# Patient Record
Sex: Female | Born: 1940
Health system: Southern US, Community
[De-identification: ages and names within clinical notes are randomized; demographics above are authoritative.]

## PROBLEM LIST (undated history)

## (undated) DIAGNOSIS — I219 Acute myocardial infarction, unspecified: Secondary | ICD-10-CM

## (undated) DIAGNOSIS — M1990 Unspecified osteoarthritis, unspecified site: Secondary | ICD-10-CM

## (undated) DIAGNOSIS — K219 Gastro-esophageal reflux disease without esophagitis: Secondary | ICD-10-CM

## (undated) DIAGNOSIS — I10 Essential (primary) hypertension: Secondary | ICD-10-CM

## (undated) DIAGNOSIS — I509 Heart failure, unspecified: Secondary | ICD-10-CM

## (undated) DIAGNOSIS — T4145XA Adverse effect of unspecified anesthetic, initial encounter: Secondary | ICD-10-CM

## (undated) DIAGNOSIS — I6529 Occlusion and stenosis of unspecified carotid artery: Secondary | ICD-10-CM

## (undated) DIAGNOSIS — R112 Nausea with vomiting, unspecified: Secondary | ICD-10-CM

## (undated) DIAGNOSIS — I2510 Atherosclerotic heart disease of native coronary artery without angina pectoris: Secondary | ICD-10-CM

## (undated) DIAGNOSIS — I209 Angina pectoris, unspecified: Secondary | ICD-10-CM

## (undated) DIAGNOSIS — J45909 Unspecified asthma, uncomplicated: Secondary | ICD-10-CM

## (undated) DIAGNOSIS — Z9889 Other specified postprocedural states: Secondary | ICD-10-CM

## (undated) DIAGNOSIS — T8859XA Other complications of anesthesia, initial encounter: Secondary | ICD-10-CM

## (undated) DIAGNOSIS — E119 Type 2 diabetes mellitus without complications: Secondary | ICD-10-CM

## (undated) DIAGNOSIS — R0602 Shortness of breath: Secondary | ICD-10-CM

## (undated) HISTORY — PX: EYE SURGERY: SHX253

## (undated) HISTORY — PX: TUBAL LIGATION: SHX77

## (undated) HISTORY — DX: Occlusion and stenosis of unspecified carotid artery: I65.29

---

## 1995-04-07 DIAGNOSIS — I219 Acute myocardial infarction, unspecified: Secondary | ICD-10-CM

## 1995-04-07 HISTORY — DX: Acute myocardial infarction, unspecified: I21.9

## 1995-04-07 HISTORY — PX: CORONARY ARTERY BYPASS GRAFT: SHX141

## 1998-06-30 ENCOUNTER — Emergency Department (HOSPITAL_COMMUNITY): Admission: EM | Admit: 1998-06-30 | Discharge: 1998-06-30 | Payer: Self-pay | Admitting: Emergency Medicine

## 1998-07-01 ENCOUNTER — Encounter: Payer: Self-pay | Admitting: Emergency Medicine

## 2001-02-23 ENCOUNTER — Inpatient Hospital Stay (HOSPITAL_COMMUNITY): Admission: EM | Admit: 2001-02-23 | Discharge: 2001-02-25 | Payer: Self-pay | Admitting: Emergency Medicine

## 2002-12-14 ENCOUNTER — Inpatient Hospital Stay (HOSPITAL_COMMUNITY): Admission: RE | Admit: 2002-12-14 | Discharge: 2002-12-16 | Payer: Self-pay | Admitting: Interventional Cardiology

## 2002-12-26 ENCOUNTER — Inpatient Hospital Stay (HOSPITAL_COMMUNITY): Admission: RE | Admit: 2002-12-26 | Discharge: 2002-12-28 | Payer: Self-pay | Admitting: Interventional Cardiology

## 2003-06-19 ENCOUNTER — Emergency Department (HOSPITAL_COMMUNITY): Admission: AD | Admit: 2003-06-19 | Discharge: 2003-06-19 | Payer: Self-pay | Admitting: Family Medicine

## 2004-12-09 ENCOUNTER — Ambulatory Visit (HOSPITAL_COMMUNITY): Admission: RE | Admit: 2004-12-09 | Discharge: 2004-12-09 | Payer: Self-pay | Admitting: Gastroenterology

## 2007-05-13 ENCOUNTER — Encounter: Payer: Self-pay | Admitting: Internal Medicine

## 2007-05-25 ENCOUNTER — Ambulatory Visit: Payer: Self-pay | Admitting: Internal Medicine

## 2007-05-25 DIAGNOSIS — J309 Allergic rhinitis, unspecified: Secondary | ICD-10-CM | POA: Insufficient documentation

## 2007-05-25 DIAGNOSIS — E785 Hyperlipidemia, unspecified: Secondary | ICD-10-CM

## 2007-05-25 DIAGNOSIS — E782 Mixed hyperlipidemia: Secondary | ICD-10-CM | POA: Insufficient documentation

## 2007-05-25 DIAGNOSIS — I10 Essential (primary) hypertension: Secondary | ICD-10-CM | POA: Insufficient documentation

## 2007-05-25 DIAGNOSIS — I219 Acute myocardial infarction, unspecified: Secondary | ICD-10-CM

## 2007-05-25 DIAGNOSIS — J45909 Unspecified asthma, uncomplicated: Secondary | ICD-10-CM | POA: Insufficient documentation

## 2007-05-25 HISTORY — DX: Acute myocardial infarction, unspecified: I21.9

## 2007-05-25 LAB — CONVERTED CEMR LAB
Basophils Absolute: 0.1 10*3/uL (ref 0.0–0.1)
Basophils Relative: 0.7 % (ref 0.0–1.0)
Eosinophils Absolute: 0.3 10*3/uL (ref 0.0–0.6)
Eosinophils Relative: 3.9 % (ref 0.0–5.0)
HCT: 37.9 % (ref 36.0–46.0)
Hemoglobin: 12 g/dL (ref 12.0–15.0)
IgE (Immunoglobulin E), Serum: 153.7 intl units/mL (ref 0.0–180.0)
Lymphocytes Relative: 30.3 % (ref 12.0–46.0)
MCHC: 31.8 g/dL (ref 30.0–36.0)
MCV: 85.4 fL (ref 78.0–100.0)
Monocytes Absolute: 0.5 10*3/uL (ref 0.2–0.7)
Monocytes Relative: 5.5 % (ref 3.0–11.0)
Neutro Abs: 5.1 10*3/uL (ref 1.4–7.7)
Neutrophils Relative %: 59.6 % (ref 43.0–77.0)
Platelets: 444 10*3/uL — ABNORMAL HIGH (ref 150–400)
RBC: 4.44 M/uL (ref 3.87–5.11)
RDW: 13.7 % (ref 11.5–14.6)
WBC: 8.6 10*3/uL (ref 4.5–10.5)

## 2007-05-30 ENCOUNTER — Ambulatory Visit: Payer: Self-pay | Admitting: Internal Medicine

## 2007-06-24 ENCOUNTER — Ambulatory Visit: Payer: Self-pay | Admitting: Internal Medicine

## 2007-12-21 ENCOUNTER — Ambulatory Visit: Payer: Self-pay | Admitting: Internal Medicine

## 2008-01-09 ENCOUNTER — Ambulatory Visit (HOSPITAL_BASED_OUTPATIENT_CLINIC_OR_DEPARTMENT_OTHER): Admission: RE | Admit: 2008-01-09 | Discharge: 2008-01-09 | Payer: Self-pay | Admitting: Internal Medicine

## 2008-01-09 ENCOUNTER — Encounter: Payer: Self-pay | Admitting: Internal Medicine

## 2008-01-14 ENCOUNTER — Ambulatory Visit: Payer: Self-pay | Admitting: Internal Medicine

## 2008-01-20 ENCOUNTER — Encounter: Payer: Self-pay | Admitting: Internal Medicine

## 2008-10-09 ENCOUNTER — Encounter: Admission: RE | Admit: 2008-10-09 | Discharge: 2008-10-09 | Payer: Self-pay | Admitting: Internal Medicine

## 2010-05-14 ENCOUNTER — Encounter: Payer: Commercial Managed Care - PPO | Attending: Internal Medicine | Admitting: *Deleted

## 2010-07-01 ENCOUNTER — Other Ambulatory Visit: Payer: Self-pay | Admitting: Internal Medicine

## 2010-07-01 DIAGNOSIS — Z1231 Encounter for screening mammogram for malignant neoplasm of breast: Secondary | ICD-10-CM

## 2010-07-16 ENCOUNTER — Ambulatory Visit
Admission: RE | Admit: 2010-07-16 | Discharge: 2010-07-16 | Disposition: A | Discharge status: Home / Self Care | Payer: Commercial Managed Care - PPO | Source: Ambulatory Visit | Attending: Internal Medicine | Admitting: Internal Medicine

## 2010-07-16 DIAGNOSIS — Z1231 Encounter for screening mammogram for malignant neoplasm of breast: Secondary | ICD-10-CM

## 2010-08-19 NOTE — Procedures (Signed)
Lynn Carey, Lynn Carey               ACCOUNT NO.:  [id number]   MEDICAL RECORD NO.:  [id number]          PATIENT TYPE:  OUT   LOCATION:  SLEEP CENTER                 FACILITY:  [facility]   PHYSICIAN:  Clinton D. Annamaria Boots, MD, FCCP, FACPDATE OF BIRTH:  11/19/1940   DATE OF STUDY:  01/09/2008                            NOCTURNAL POLYSOMNOGRAM   REFERRING PHYSICIAN:   SLEEP STUDY REPORT.   REFERRING PHYSICIAN:  Clinton D. Annamaria Boots, MD, FCCP, FACP.   INDICATION FOR STUDY:  Hypersomnia with sleep apnea.  Epworth sleepiness  score 7/24, BMI 37.5, weight 192 pounds, height 60 inches, and neck 15  inches.   HOME MEDICATIONS:  Charted and reviewed.   SLEEP ARCHITECTURE:  Total sleep time 249 minutes with sleep efficiency  52.3%.  Stage I was 12.4%, stage II 61.2%, stage III 18.3%, REM 8% of  total sleep time.  Sleep latency 27 minutes, REM latency 359 minutes,  awake after sleep onset 191 minutes, arousal index 14.2.  Sleep  architecture was marked by frequent and sustained waking, nonspecific.  No bedtime medication was taken.   RESPIRATORY DATA:  Apnea-hypopnea index (AHI) 13.5 per hour.  A total of  56 events were scored including the 11 obstructive apneas and 45  hypopneas.  Events were not positional.  REM AHI 66 per hour.  The  technician did not attempt CPAP titration.   OXYGEN DATA:  Moderately loud snoring with oxygen desaturation to a  nadir of 73%, mean oxygen saturation through the study was 94.4% on room  air.  A total of 3.6 minutes was spent with saturation less than 88%.   CARDIAC DATA:  Sinus rhythm with occasional PVC.   MOVEMENT/PARASOMNIA:  No significant movement disturbance.  Bathroom x2.   IMPRESSION/RECOMMENDATION:  1. Mild obstructive sleep apnea/hypopnea syndrome, apnea-hypopnea      index 13.5 per hour with nonpositional      events.  Moderately loud snoring at oxygen desaturation to a nadir      of 73%.  2. Consider return for CPAP titration or evaluate for  alternative      therapy as appropriate.      Clinton D. Annamaria Boots, MD, [facility], FACP  Diplomate, Tax adviser of Sleep Medicine  Electronically Signed     CDY/MEDQ  D:  01/14/2008 13:35:25  T:  01/14/2008 [id number]  Job:  [id number]

## 2010-08-22 NOTE — Consult Note (Signed)
[place]. [facility]  Patient:    Lynn Carey, Lynn Carey Visit Number: [id number] MRN: [id number]          Service Type: MED Location: 2000 2034 01 Attending Physician:  Doyce Para Dictated by:   Illene Labrador, M.D. Admit Date:  02/23/2001   CC:         [name]. [name], M.D.   Consultation Report  INDICATIONS FOR CONSULTATION:  Chest discomfort.  CONCLUSIONS: 1. Prolonged chest discomfort probably representing unstable angina. 2. Coronary atherosclerotic heart disease status post three vessel coronary    artery bypass surgery in 1996. 3. History of asthma. 4. History of hypertension. 5. Hyperlipidemia. 6. Gastroesophageal reflux disease.  RECOMMENDATION: 1. Serial enzymes to rule out myocardial infarction. 2. Serial EKGs. 3. IV Lasix to treat possible vascular congestion. 4. Coronary angiography to define coronary anatomy and help guide therapy. 5. IV heparin. 6. IV nitroglycerin. 7. Continue medications as listed.  COMMENTS:  Patient is a very pleasant 70 year old who has a history of coronary artery disease who underwent coronary artery bypass grafting at age 15.  She had three vessel bypass but does not know which grafts were placed. This morning she awakened with heaviness in her chest that was reminiscent of chest pain that led to her bypass.  She states that she had a heart attack that led to her recognition of having coronary artery disease.  Since bypass she has been asymptomatic.  In the emergency room today IV nitroglycerin, oxygen, and pain medication has brought about relief.  She is currently very stable.  CURRENT MEDICATIONS:  Flovent, Vioxx, Detrol, Singulair, Zocor, Norvasc, Advair.  SOCIAL HISTORY:  Habits:  Does not smoke and denies any significant ethanol consumption.  Not taking aspirin.  FAMILY HISTORY:  Markedly positive for coronary artery disease with mother dying of a massive myocardial infarction and a younger  brother having undergone five vessel bypass surgery.  PHYSICAL EXAMINATION  VITAL SIGNS:  Height 5 feet, weight at least 200 pounds, blood pressure 140/80, heart rate 72.  SKIN:  Warm and dry.  No nail bed cyanosis.  HEENT:  No xanthelasma.  Extraocular movements are full.  NECK:  No JVD.  LUNGS:  Clear to auscultation and percussion.  CARDIAC:  No clicks.  No rubs.  No gallops.  ABDOMEN:  Soft.  Liver and spleen are not palpable.  Bowel sounds are normal.  EXTREMITIES:  No edema.  Pulses are 2+ and symmetric in the upper and lower extremities.  LABORATORIES:  EKG reveals biphasic anterior T-waves, normal sinus rhythm. Chest x-ray is said to show cardiac enlargement and possible mild vascular congestion.  I have not yet seen those studies.  Creatinine 0.7, BUN 12, potassium 3.6.  CK-MB 95/1.3, troponin I 0.01. Dictated by:   Illene Labrador, M.D. Attending Physician:  Doyce Para DD:  02/23/01 TD:  02/24/01 Job: [id number]

## 2010-08-22 NOTE — Discharge Summary (Signed)
[name]. [facility]  Patient:    Lynn Carey, MACZKA Visit Number: [id number] MRN: [id number]          Service Type: MED Location: 2000 2034 01 Attending Physician:  Doyce Para Dictated by:   Doyce Para, M.D. Admit Date:  02/23/2001 Discharge Date: 02/25/2001   CC:         Lynn Carey. Lynn Carey, M.D.  Illene Carey, M.D.   Discharge Summary  DATE OF BIRTH:  [birth year]  DISCHARGE DIAGNOSES: 1. Unstable angina.    a. Cardiac enzymes and electrocardiogram negative.    b. Cardiac catheterization:  Patent graft to right coronary artery       and left anterior descending, question occlusion of third graft.       Left ventricular function normal.  Severe native disease with 100%       right coronary artery, 100% left anterior descending, 80% diagonal       1, 80% distal circumflex before obtuse marginal 2.    c. No intervenable lesion. 2. Known coronary artery disease; coronary artery bypass, 1998 [facility]). 3. Hypertension. 4. Dyslipidemia. On September 2002:  Total cholesterol 260, triglyceride 139,    HDL 43, LDL 177. 5. Chronic heartburn and acid reflux. 6. Adult onset asthma; question contribution of acid reflux. 7. Bilateral knee osteoarthritis. 8. Health maintenance; flu shot given prior to discharge.  DISCHARGE MEDICATIONS:  1. (New) Pepcid 10 mg one p.o. b.i.d.  2. (Resume taking) enteric-coated aspirin 325 mg q.d. - never stop taking     this.  3. (New) Lopressor 50 mg 1/2-tab b.i.d.  4. (New) Imdur 30 mg one p.o. q.d.  5. (Decreased dose) Norvasc 5 mg 1/2-tab q.d.  Continue the following medications as before:  6. Flovent inhaler b.i.d.  7. Advair inhaler b.i.d.  8. Detrol LA daily.  9. Vioxx 12.5 mg q.o.d. as needed for pain. 10. Singulair 10 mg at bedtime. 11. Zocor 40 mg q.d.  CONDITION ON DISCHARGE:  Stable.  Afebrile.  Blood pressure 138/82, heart rate 69, respiratory rate 20, and oxygen saturations 96% on room  air.  DISPOSITION:  The patient will be returning home where she lives alone. Family to check on as needed.  RECOMMENDED ACTIVITY:  As tolerated.  RECOMMENDED DIET:  Low salt, low fat.  SPECIAL INSTRUCTIONS: 1. Keep groin area clean and dry.  Call Dr. Tamala Julian if there are problems. 2. Avoid ibuprofen-like products (Vioxx substitutes) - excess can aggravate    stomach. 3. Call if asthma worsens.  FOLLOW-UP APPOINTMENT: 1. Dr. Kelton Pillar on Friday, March 04, 2001, at 12:15.  Blood pressure    as well as symptoms of asthma and acid reflux should be checked.  If she    calls with increased wheezing, we may need to discontinue the beta blocker    and go back up on her Norvasc. 2. Dr. Daneen Schick.  The patient is to call to schedule a follow-up    appointment in two to four weeks.  CONSULTANTS:  Daneen Schick, M.D.  PROCEDURES: 1. Chest x-ray:  Cardiomegaly with mild vascular congestion. 2. EKG:  Normal sinus rhythm.  T-wave abnormality.  Consider anterior    ischemia. 3. A 2D echo:  Overall LV systolic function at the lower limits of normal.    Ejection fraction 50% to 55%.  Mild diffuse left ventricular hypokinesis.    Left ventricular diastolic function parameters were normal.  No evidence of    mitral valve prolapse.  4. Cardiac catheterization:  Patent SVG to RCA second, patent LIMA to LAD,    question occlusion of third SVG.  LV function appeared normal.  Severe    native disease with 100% RCA, 100% LAD, 80% diagonal 1, 80% distal    circumflex before the OM 2.  HOSPITAL COURSE: #1 - UNSTABLE ANGINA:  The patient is a 70 year old African-American female who presents with atypical chest pain.  She has known coronary artery disease and underwent bypass surgery five years ago at [facility].  She also has hypertension and dyslipidemia.  She presents with a one-month history of chest pressure by climbing up her two flights of stairs.  On the day of presentation, she awoke  with chest pain which was substernal in nature and described as a pressure.  EKG was nonspecific and serial enzymes were negative.  She was placed on aspirin, heparin, nitrates, and beta blocker. She had stopped taking her aspirin several months ago.  I do not think she realized that it was important to continue taking it.  Dr. Pernell Dupre saw her and performed cardiac catheterization, which showed the results as described above.  Post cardiac catheterization she was pain-free and remained that way.  Dr. Tamala Julian recommended medical management, and she was placed on a long-acting nitrate, aspirin, and a beta blocker.  She had been taking 12.5 elixir beta blocker here in the hospital without any evidence of asthma exacerbation.  I opted to increase her to 25 mg a day which allows her to use a pill form which is much less expensive, and finances have been an issue.  She knows to call if she has any asthma exacerbations.  Her blood pressure has been in the 110 to 140 range.  Because of the increase in beta blocker, I have opted to cut back on the Norvasc slightly.  She will have blood pressure as well as breathing function reassessed in follow up.  #2 - ACID REFLUX:  The patient describes nearly daily episodes of heartburn. She describes a bad taste in her mouth and a cough in the morning.  I wondering if this is not aggravating her asthma, and I put her on a proton pump inhibitor here.  It did drastically help with her symptoms, but the cost is prohibitive at this point especially with other medications being started. I have told her to take Pepcid 10 mg b.i.d. and see if Dr. Laurann Carey has samples of Protonix when she follows up.  Although I would like to take her off the Vioxx completely, she has bad degenerative disk disease and really relies on that to keep mobile.  I did her to avoid using any extra ibuprofen or similar medications.   #3- MILD CONGESTIVE HEART FAILURE ON X-RAY:  I did  diurese the patient with Lasix.  Her lungs were clear by auscultation.  We did not feel that we needed to send her home on a diuretic.  NOTE:  Greater than 30 minutes arranging discharge. Dictated by:   Doyce Para, M.D. Attending Physician:  Doyce Para DD:  02/25/01 TD:  02/26/01 Job: [id number]

## 2010-08-22 NOTE — Cardiovascular Report (Signed)
[place]. [facility]  Patient:    Lynn Carey, Lynn Carey Visit Number: [id number] MRN: [id number]          Service Type: MED Location: 2000 2034 01 Attending Physician:  Doyce Para Dictated by:   Illene Labrador, M.D. Proc. Date: 02/23/01 Admit Date:  02/23/2001   CC:         [name]. [name], M.D.  Doyce Para, M.D.  Cardiac Catheterization Laboratory   Cardiac Catheterization  INDICATION: Patient with a history of coronary artery bypass grafting 5 years ago and reportedly received a saphenous vein graft to two vessels and a LIMA to the LAD. She does not know exactly which vessels were bypassed, but apparently 3 grafts were placed. She presented with chest discomfort similar to her anginal pain and was admitted for evaluation to rule out ischemia and myocardial infarction. This study is being done to evaluate anatomy.  PROCEDURES PERFORMED: 1. Left heart catheterization. 2. Selective coronary angiography. 3. Left ventriculography. 4. Saphenous vein graft angiography. 5. Left internal mammary graft angiography.  DESCRIPTION OF PROCEDURE: After informed consent, a 6 French sheath was inserted into the right femoral artery using a modified Seldinger technique. A 6 French A2 multipurpose catheter was used for hemodynamic recordings, left ventriculography, and selective right coronary angiography, and right saphenous vein graft angiography. This catheter was also used for aortography. Aortography was performed to localize any noncannulated saphenous vein grafts.  We used a left #4 Judkins catheter for left coronary angiography and internal mammary artery for left internal mammary artery angiography. The patient tolerated the procedure without complications.  RESULTS:  I: HEMODYNAMIC DATA:     a. The aortic pressure 180/96 mmHg.     b. Left ventricular pressure 174/14 mmHg.  II: LEFT VENTRICULOGRAPHY: The left ventricle is normal in size  and demonstrates normal overall contractility. EF is estimated to be 60%.  III: CORONARY ANGIOGRAPHY:     a. Left main: The left main coronary artery is long with luminal        irregularities but no high-grade obstruction.     b. Left anterior descending coronary artery: The left anterior        descending is totally occluded after the origin of the first        diagonal.     c. Circumflex artery: The circumflex artery is large giving origin to        three obtuse marginals. The second and third obtuse marginals are        the larger of the three. Proximal to the second obtuse marginal and        extending slightly beyond it, there is 70-80% stenosis affecting the        bifurcation. The marginals themselves are relatively small but free        of any significant obstruction.     d. Ramus branch: The ramus branch is patent and free of any significant        obstruction. There is luminal irregularity.     e. Right coronary artery: The right coronary artery is totally occluded        proximally.  IV: BYPASS GRAFT ANGIOGRAPHY:     a. Saphenous vein graft to the right coronary: Widely patent. The distal        vessel is free of significant obstruction beyond the graft        insertion site.     b. Left internal mammary artery graft to the LAD:  This graft contains        marked tortuosity and redundancy in the proximal and midportion.        There is some ostial narrowing that arises from the subclavian. This        obstructs the vessel by no more than 30%. There was mild catheter        damping with engagement. The LAD beyond the graft insertion site is        free of any significant obstruction.     c. Saphenous vein graft #2: This graft was not visualized even with an        aortic injection and is soon to be occluded if indeed the patient had        two saphenous vein grafts placed.  V: AORTOGRAPHY: Aortography using 35 cc of contrast at 10 cc/sec. did not demonstrate any evidence  of saphenous vein graft other than the saphenous vein graft arising from the right lateral aorta supplying the right coronary.  CONCLUSIONS: 1. Severe native coronary disease with total occlusion of the LAD, high-grade    obstruction in the first diagonal. High-grade obstruction in the mid to    distal circumflex or near the origin of the second obtuse marginal, and    total occlusion of the right coronary. 2. Patent saphenous vein graft to the right coronary and left internal mammary    artery to the left anterior descending. 3. Potential occlusion of a second saphenous vein graft that was never    visualized or demonstrated by aortography. 4. Normal overall left ventricular function.  RECOMMENDATIONS: Medical therapy. Obtain information from [facility] concerning the exact nature of the patients bypass graft surgery. Potentially, the first diagonal and the circumflex could be intervened upon, although these vessels are small and re-stenosis rate would be high and right now would seem most prudent to use medical therapy. Dictated by:   Illene Labrador, M.D. Attending Physician:  Doyce Para DD:  02/24/01 TD:  02/25/01 Job: [id number]

## 2010-08-22 NOTE — Discharge Summary (Signed)
NAME:  Lynn Carey, Lynn Carey NO.:  [id number]   MEDICAL RECORD NO.:  [id number]                   PATIENT TYPE:  INP   LOCATION:  [id number]                                 [facility]   PHYSICIAN:  Belva Crome, M.D.                DATE OF BIRTH:  [birth year]   DATE OF ADMISSION:  12/26/2002  DATE OF DISCHARGE:  12/28/2002                                 DISCHARGE SUMMARY   DISCHARGING PHYSICIAN:  Dr. Tamala Julian.   REASON FOR ADMISSION AND HISTORY OF PRESENT ILLNESS:  This is a 70 year old  female patient with known CAD status post CABG x 3 in 1997.  Presented to  our office complaining of midsternal chest discomfort, described as  tightness.  Has been occurring for at least a month and a half.  Increasing  fatigue.  Usually with strenuous activity did this dyspnea on exertion  occur, and pain was relieved with rest.  She saw Dr. Tamala Julian in the office at  the end of August and was given a prescription for nitroglycerin, and she  also had Norvasc as well as Zetia added to her medical regimen at the end of  August by her PCP as well.  Prior to this catheterization, her last cardiac  catheterization had been on February 23, 2001, and at that time her CABG  grafts were patent and her LV function was normal, with an EF of 60%.   This patient was admitted with a diagnosis of:  1. Continued chest pain/angina, rule out occluded graft.  2. Hypertension, uncontrolled.  3. Dyslipidemia.   HOSPITAL COURSE:  Recurrent chest pain/angina.  Patient was taken to the  cardiac catheterization lab on  December 26, 2002, at which time the patient had successful stent placement  to the distal left main.  Initial stenotic lesion was 80%, down to 0% after  angioplasty.  She also had PTCA of the diagonal #1.  It was 95% down to 90%  after angioplasty.  She was started on aspirin and Plavix post procedure, as  well as Integrilin for 24 hours, with plans to discharge her on  December 28, 2002.  The patient has subsequently done quite well with stable vital  signs and laboratory values in the postoperative period, and her groin is  unremarkable.   FINAL DISCHARGE DIAGNOSES:  1. Recent recurrent angina, history of coronary artery bypass graft status     post stent to the left main and angioplasty to the diagonal #1.  2. Hypertension, now controlled, most recent blood pressure 108/58.  3. Known dyslipidemia.  4. Asthma.   DISCHARGE MEDICATIONS:  1. Aspirin 325 mg daily.  2. Plavix 75 mg daily.  3. Pepcid AC 10 mg daily.  4. Lopressor 50 mg b.i.d.  5. Norvasc 5 mg daily.  6. Zocor 80 mg daily at 6:00 p.m.  7. Zetia 10 mg daily.  8. Detrol LA  4 mg daily.  9. Imdur 60 mg daily.  10.      Advair 500/50 two puffs twice daily.  11.      Singulair 10 mg at bedtime.   PAIN MANAGEMENT:  Over-the-counter Tylenol.   ACTIVITIES:  No strenuous activities for the next two days.  No driving or  sexual activity on the day of discharge.  No lifting greater than 5 pounds,  bending, or stooping or straining.   DIET:  Cardiac with low fat, low cholesterol modifiers.  One ________ only  for the next two days.   SPECIAL INSTRUCTIONS:  She has been told to call Dr. Thompson Caul office at [phone]  [phone] if she has recurrent pain, swelling, or bruising at the catheterization  site, and her follow-up appointment has been scheduled with Dr. Tamala Julian on  Monday, January 15, 2003, at 1:45 p.m.      [place] Lissa Merlin, N.P.                    Belva Crome, M.D.    ALE/MEDQ  D:  12/28/2002  T:  12/29/2002  Job:  [id number]

## 2011-04-13 ENCOUNTER — Encounter (HOSPITAL_COMMUNITY): Payer: Self-pay | Admitting: Pharmacy Technician

## 2011-04-13 DIAGNOSIS — E782 Mixed hyperlipidemia: Secondary | ICD-10-CM | POA: Diagnosis not present

## 2011-04-13 DIAGNOSIS — Z951 Presence of aortocoronary bypass graft: Secondary | ICD-10-CM | POA: Diagnosis not present

## 2011-04-13 DIAGNOSIS — I2510 Atherosclerotic heart disease of native coronary artery without angina pectoris: Secondary | ICD-10-CM | POA: Diagnosis not present

## 2011-04-13 DIAGNOSIS — I209 Angina pectoris, unspecified: Secondary | ICD-10-CM | POA: Diagnosis not present

## 2011-04-19 NOTE — H&P (Addendum)
HOPI: Patient referred  for follow-up/evaluation and treatment of CAD and chest pain.   Chest pain started 2 months  ago.   It is described as pressure.   Chest pain is present withwith activity.   Frequency is about always with activity and 3-4  per week.   Worsening  Yes.   It is located in the middle of the chest region.   It does not radiate to the  arm.   Chest discomfort lasts several minutes.   Chest discomfort is associated with  dyspnea; Denies PND,  Orthopnea;  Diaphoresis;  Palpitations;  Nausea;  vomiting,  Dizziness or  near syncope.   Pain is worse with activity and is easily relieved with rest.   Chest pain is described as being very similar to the chest discomfort prior to patient's prior MI.    The patient has had SOB but no PND or orthopnea.   Denies hemoptysis.  Pain is relieved with rest and sublingual NTG.  Patient had stopped taking all her heart medications including diabetic medications 2-3 years ago.    ROS: Arthritis-yes, life-style limiting-yes;  Cramping of the legs and feet at night or with activity-sometimes; Diabetes-yes, Controlled-yes;  Hypothyroidism-no;  Previous Stroke-no, Previous GI Bleed-no ,  Recent weight change-no.   Other systems negative.     Known allergies: NKDA.   Past Medical History (Major events, hospitalizations, surgeries):  MI/CABG 1997 [facility]).  History of CABGs in 1997.  Cardiac catheterization 02/23/2001 had revealed RCA occluded in the midsegment with patent SVG to RCA.  LAD occluded in the midsegment after the origin of a large diagonal 1 with ostial 80-90% stenosis.  LIMA to LAD is patent.  Left subclavian artery showed a 30% stenosis with mild damping.  And circumflex coronary artery has large OM2,OM3. 80% stenosis at OM2 origin SVG to obtuse marginal was occluded.    Sleep study 01/09/08 (negative for sleep apnea).    Ongoing medical problems: Hypertension. Type II Diabetes Mellitus. Hyperlipidemia. Asthma/Allergies. Arthritis (right  arm/hips/knees). Acid reflux.    Family medical history: Mother died at age 78 from a massive MI. Father died in his late 20's/early 53's from a MI. 1 half-brother has hypertension; diabetes mellitus; h/o MI/CABG at age 35. Half-sister has a heart murmur.    Preventative: Colonoscopy 2008. No endoscopy.    Social history: No smoking. No alcohol. Divorced-2 sons live with her. 5 children.  ALLERGY:       Moderate Lisinopril oral tablet allergy resulting in Cough (local)  Physical Exam: Afebrile, HR 68/min, reg, 170/90 mm Hg. RR 16.  Short stature built and obese bodily habitus, in no acute distress.   Appears stated age.   Alert Ox3 and in no acute distress.   There is no cyanosis.   HEENT: normal limits.   NECK: no JVD noted.  Short neck.  CARDIAC EXAM: S1 S2 normal, no gallop or murmur.    CHEST EXAM: No tenderness of chest wall.   LUNGS: Clear to percuss and auscultate.   No rales or ronchi.    ABDOMEN: No hepatosplenomegaly.   BS normal in all 4 quadrants.   Abdomen is non-tender.    MUSCULOSKELETAL EXAM: Intact with full range of motion in all 4 extremities.   NEUROLOGIC EXAM: Grossly intact without any focal deficits.   Alert O x 3.    EXTREMITY: No edema.   Normal pulses.   VASCULAR EXAM: No skin breakdown.   Carotids soft bruit left.   Extremities: Left  subclavian bruit soft present.   Femoral pulse normal.   Difficult to feel due to bodily habitus. Popliteal pulse normal ; Pedal pulse normal.   No prominent pulse felt in the abdomen.   No varicose veins.  Impression:    1.   Chest pain suggestive of angina pectoris.   Chest pain similar to the discomfort prior to coronary revascularization.     Patient has used 5 sublingual nitroglycerin tablets since last office visit on 03/05/2011.  Otherwise doing well.  No rest pain. ECG 03/05/11: NSR, Normal intervals. Poor R progression. Diffuse non specific T flattening. Exercise sestamibi 04/03/11: 3 min, 3.7 METs. Strongly +ve ischemic EKG  change.  Inferior scar noted. Cannot exclude balanced ischemia. EF 36% 2.   CAD/ASHD  H/O CABGs in 1997 (Cath 2002: LIMA to LAD, SVG to RCA.   SVG to OM occluded.   Native LAD, RCA occluded).   Cardiac catheterization 02/23/2001 had revealed RCA occluded in the midsegment with patent SVG to RCA.   LAD occluded in the midsegment after the origin of a large diagonal 1 with ostial 80-90% stenosis.   LIMA to LAD is patent.   Left subclavian artery showed a 30% stenosis with mild damping.   And circumflex coronary artery has large OM2,OM3.   80% stenosis at OM2 origin SVG to obtuse marginal was occluded.    3.   Carotid stenosis. Carotid Bruit.   Soft.   Left.  Carotid duplex 03/26/11: Mild bilateral stenosis of 50-69%. RICA veloxity 176/47, Lt 148/38 cm/sec Velocity. Needs repeat carotid duplex in 6 months.  4.   PAD/Claudication: No claudication.    Patient complains of bilateral hip pain and leg pain on walking.  Patient has been taking Celebrex for the same.  I cannot exclude the presence of iliac artery disease although she  2+ pedal pulses.  I may try to scan her abdominal aorta during cardiac catheterization.   She also has mild left subclavian artery stenosis with an ostial 30% stenosis.  Equal blood pressure in both the upper extremities. 5.   Hypertension not at goal.     Blood pressure today 170/90 mmHg. 6.   Hyperlipidemia.   Mixed.   7.   Diabetes Mellitus II controlled.   Labs done on 01/22/2011 revealed TSH normal, HbA1c 7.0, total cholesterol 270, triglycerides 200, HDL 52 and LDL 178.   Non-HDL cholesterol was 168.  Electrolytes normal, BUN 14,  S creatinine was normal with eGFR 81.   CBC normal.   Plan:   Mechanism of underlying disease process and action of medications discussed with the patient.   I discussed primary/secondary prevention and also dietary counceling was done.   Calorie restriction was discussed.    Medications: Stopped Celebred due to CAD . But continue other medications.   Will increase the dose of Metoprolol to 50mg  qBID.     Patient continues to have chest discomfort and has used 5 sublingual nitroglycerin since her last office visit on 03/05/2011.  Because of markedly abnormal stress test and her ongoing symptomatology we have opted for cardiac catheterization. Schedule for cardiac catheterization. We discussed regarding risks, benefits, alternatives to this including stress testing, CTA and continued medical therapy. Patient wants to proceed. Understands <1-2% risk of death, stroke, MI, urgent CABG, bleeding, infection, renal failure but not limited to these. Patient's daughter present at bedside.  REFERRALS:       Glendale Chard (Internal Medicine)  Fax: [phone]  Phone: [phone]  No change in the HPI

## 2011-04-21 ENCOUNTER — Encounter (HOSPITAL_COMMUNITY)
Admission: RE | Disposition: A | Discharge status: Home / Self Care | Payer: Self-pay | Source: Ambulatory Visit | Attending: Cardiology

## 2011-04-21 ENCOUNTER — Ambulatory Visit (HOSPITAL_COMMUNITY)
Admission: RE | Admit: 2011-04-21 | Discharge: 2011-04-21 | Disposition: A | Discharge status: Home / Self Care | Payer: Commercial Managed Care - PPO | Source: Ambulatory Visit | Attending: Cardiology | Admitting: Cardiology

## 2011-04-21 DIAGNOSIS — I2510 Atherosclerotic heart disease of native coronary artery without angina pectoris: Secondary | ICD-10-CM | POA: Insufficient documentation

## 2011-04-21 DIAGNOSIS — E119 Type 2 diabetes mellitus without complications: Secondary | ICD-10-CM | POA: Insufficient documentation

## 2011-04-21 DIAGNOSIS — I200 Unstable angina: Secondary | ICD-10-CM | POA: Diagnosis not present

## 2011-04-21 DIAGNOSIS — Z951 Presence of aortocoronary bypass graft: Secondary | ICD-10-CM | POA: Insufficient documentation

## 2011-04-21 DIAGNOSIS — K219 Gastro-esophageal reflux disease without esophagitis: Secondary | ICD-10-CM | POA: Insufficient documentation

## 2011-04-21 DIAGNOSIS — I10 Essential (primary) hypertension: Secondary | ICD-10-CM | POA: Insufficient documentation

## 2011-04-21 HISTORY — PX: LEFT HEART CATHETERIZATION WITH CORONARY/GRAFT ANGIOGRAM: SHX5450

## 2011-04-21 LAB — GLUCOSE, CAPILLARY
Glucose-Capillary: 103 mg/dL — ABNORMAL HIGH (ref 70–99)
Glucose-Capillary: 119 mg/dL — ABNORMAL HIGH (ref 70–99)

## 2011-04-21 SURGERY — LEFT HEART CATHETERIZATION WITH CORONARY/GRAFT ANGIOGRAM
Anesthesia: LOCAL

## 2011-04-21 MED ORDER — ASPIRIN 81 MG PO CHEW
324.0000 mg | CHEWABLE_TABLET | ORAL | Status: AC
  Administered 2011-04-21: 324 mg via ORAL

## 2011-04-21 MED ORDER — SODIUM CHLORIDE 0.9 % IV SOLN: 1.0000 mL/kg/h | INTRAVENOUS | Status: DC

## 2011-04-21 MED ORDER — SODIUM CHLORIDE 0.9 % IV SOLN: 250.0000 mL | INTRAVENOUS | Status: DC | PRN

## 2011-04-21 MED ORDER — HYDRALAZINE HCL 20 MG/ML IJ SOLN
INTRAMUSCULAR | Status: AC
  Filled 2011-04-21: qty 1

## 2011-04-21 MED ORDER — ASPIRIN 81 MG PO CHEW
CHEWABLE_TABLET | ORAL | Status: AC
  Filled 2011-04-21: qty 4

## 2011-04-21 MED ORDER — FENTANYL CITRATE 0.05 MG/ML IJ SOLN
INTRAMUSCULAR | Status: AC
  Filled 2011-04-21: qty 2

## 2011-04-21 MED ORDER — SODIUM CHLORIDE 0.9 % IV SOLN
INTRAVENOUS | Status: DC
  Administered 2011-04-21: 09:00:00 via INTRAVENOUS

## 2011-04-21 MED ORDER — NITROGLYCERIN 0.2 MG/ML ON CALL CATH LAB
INTRAVENOUS | Status: AC
  Filled 2011-04-21: qty 1

## 2011-04-21 MED ORDER — MIDAZOLAM HCL 2 MG/2ML IJ SOLN
INTRAMUSCULAR | Status: AC
  Filled 2011-04-21: qty 2

## 2011-04-21 MED ORDER — SODIUM CHLORIDE 0.9 % IJ SOLN: 3.0000 mL | Freq: Two times a day (BID) | INTRAMUSCULAR | Status: DC

## 2011-04-21 MED ORDER — LIDOCAINE HCL (PF) 1 % IJ SOLN
INTRAMUSCULAR | Status: AC
  Filled 2011-04-21: qty 30

## 2011-04-21 MED ORDER — SODIUM CHLORIDE 0.9 % IJ SOLN: 3.0000 mL | INTRAMUSCULAR | Status: DC | PRN

## 2011-04-21 MED ORDER — ACETAMINOPHEN 325 MG PO TABS
650.0000 mg | ORAL_TABLET | ORAL | Status: DC | PRN
  Administered 2011-04-21: 650 mg via ORAL

## 2011-04-21 MED ORDER — ALUM & MAG HYDROXIDE-SIMETH 200-200-20 MG/5ML PO SUSP
15.0000 mL | Freq: Once | ORAL | Status: AC
  Administered 2011-04-21: 30 mL via ORAL
  Filled 2011-04-21: qty 30

## 2011-04-21 MED ORDER — ACETAMINOPHEN 325 MG PO TABS
ORAL_TABLET | ORAL | Status: AC
  Filled 2011-04-21: qty 2

## 2011-04-21 MED ORDER — LABETALOL HCL 5 MG/ML IV SOLN
INTRAVENOUS | Status: AC
  Filled 2011-04-21: qty 4

## 2011-04-21 MED ORDER — ONDANSETRON HCL 4 MG/2ML IJ SOLN: 4.0000 mg | Freq: Four times a day (QID) | INTRAMUSCULAR | Status: DC | PRN

## 2011-04-21 MED ORDER — HEPARIN (PORCINE) IN NACL 2-0.9 UNIT/ML-% IJ SOLN
INTRAMUSCULAR | Status: AC
  Filled 2011-04-21: qty 2000

## 2011-04-21 NOTE — Brief Op Note (Signed)
04/21/2011  1:31 PM  PATIENT:  Lynn Carey  71 y.o. female  PRE-OPERATIVE DIAGNOSIS:  cad/stable angina  POST-OPERATIVE DIAGNOSIS:  CAD of the native coronary arteries and venous bypass grafts.   PROCEDURE:  Procedure(s): LEFT HEART CATHETERIZATION WITH CORONARY/GRAFT ANGIOGRAM  SURGEON:  Surgeon(s): Laverda Page   DICTATION: .Other Dictation: Dictation Number [id number]

## 2011-04-22 LAB — GLUCOSE, CAPILLARY: Glucose-Capillary: 113 mg/dL — ABNORMAL HIGH (ref 70–99)

## 2011-04-22 NOTE — Cardiovascular Report (Signed)
Lynn Carey, Lynn Carey               ACCOUNT NO.:  [id number]  MEDICAL RECORD NO.:  [id number]  LOCATION:  [facility]                         FACILITY:  [facility]  PHYSICIAN:  Laverda Page, MD DATE OF BIRTH:  1941-03-12  DATE OF PROCEDURE:  04/21/2011 DATE OF DISCHARGE:  04/21/2011                           CARDIAC CATHETERIZATION   PROCEDURE PERFORMED: 1. Left ventriculography. 2. Selective right and left coronary arteriography. 3. Saphenous vein graft and left internal mammary arteriography.  INDICATION:  Lynn Carey is a pleasant 71 year old female with history of known coronary artery disease status post CABG in 1997.  Cardiac catheterization in 2002, and again repeat cardiac catheterization in September 2004, had revealed occluded saphenous vein graft to the obtuse marginals, patent SVG to RCA, occluded native LAD and native right coronary artery and patent LIMA to LAD.  She has had a PTCA and stent of the left main coronary artery in 2004.  She now presents with repeat angina pectoris similar to her angina prior to her myocardial infarction, and coronary artery bypass grafting in the past.  Hence, she is brought to the cardiac cath lab to evaluate her coronary anatomy.  HEMODYNAMIC DATA:  The left ventricular pressure was 114/12 with end- diastolic pressure of 18 mmHg.  Aortic pressure was 124/76 with a mean of 88 mmHg.  There was no pressure gradient across the aortic valve.  ANGIOGRAPHIC DATA:  Left ventricle:  Left ventricular systolic function was moderately depressed at around 45% with global hypokinesis.  No significant mitral regurgitation.  Right coronary artery:  Right coronary artery is occluded in the proximal segment.  SVG to PDA:  SVG to PDA is widely patent with mild luminal irregularity. The PL branch of right coronary artery itself has mild diffuse disease. There are faint collaterals noted to the LAD.  LIMA TO LAD:  LIMA to LAD is widely patent.  Left main  coronary artery:  Left main coronary artery is severely diffusely diseased.  The previously placed stent in the distal segment of the LAD distally and the left main is widely patent.  Proximal to the stent, there is diffuse haziness with a 50% stenosis at the trifurcation of a small what appears to be a high diagonal branch versus a branch from the ramus intermediate, and ramus intermediate and circumflex coronary artery have a trifurcation high-grade 99% stenosis including involvement of the distal left main distal to the previously placed stent.  Circumflex coronary artery:  Circumflex coronary artery is a moderate caliber vessel  and diffusely diseased.  However, it supplies a large territory.  There is no significant collaterals noted to the circumflex coronary artery except faint collaterals.  Ramus intermediate:  Ramus intermediate is a moderate caliber vessel. Again, the ostium of the ramus and also the circumflex coronary artery has ostial 99% stenosis.  LAD:  LAD is occluded in the proximal segment.  It is filled retrogradely via LIMA to LAD which is widely patent.  IMPRESSION: 1. Moderate decrease in left ventricular systolic function, ejection     fraction is 45% with global hypokinesis. 2. Patent saphenous vein graft to right coronary artery with occluded     native right coronary artery. 3. Patent  left internal mammary artery to left anterior descending     with occluded native left anterior descending. 4. Distal left main stent placed in 2004 is patent, however, proximal     to the stent and distal to the stent, there is a high-grade     stenosis.  Proximal to the stent was a 50% stenosis and distal to     the stent involving the ostial circumflex, ostial ramus     intermediate, and a very small secondary branch of the ramus     intermediate have a high-grade 99% stenosis.  RECOMMENDATION:  Based on the coronary anatomy, she would benefit from proceeding with repeat  stenting to her trifurcation of the ramus intermediate and circumflex essentially protecting these 2 vessels.  A secondary branch of the ramus intermediate will probably be sacrificed which should be inconsequential at this point.  Whether a redo bypass is appropriate at this age is always questionable, however, I have done this with the patient and the surgeon before making final decisions.  TECHNIQUE OF THE PROCEDURE:  Under sterile precaution using a 5-French right femoral arterial access, a 5-French multipurpose B2 catheter was advanced into the ascending aorta and left ventriculography was performed in RAO projection.  Same catheter was utilized to engage the saphenous vein graft to the right coronary artery, and left internal mammary artery was selectively cannulated and angiography was performed. The catheter then pulled out of body.  A 5-French Judkins left 4 diagnostic catheter was utilized to engage the right and the left main coronary artery and angiography was performed.  A 5-French LIMA catheter was utilized to engage the left internal mammary artery selectively and angiography was repeated.  The catheter then pulled out of body over a J- wire.  The patient tolerated the procedure well.  No immediate complication.     Laverda Page, MD     JRG/MEDQ  D:  04/22/2011  T:  04/22/2011  Job:  [id number]  cc:   Theda Belfast. Baird Cancer, M.D.

## 2011-04-30 DIAGNOSIS — E1129 Type 2 diabetes mellitus with other diabetic kidney complication: Secondary | ICD-10-CM | POA: Diagnosis not present

## 2011-04-30 DIAGNOSIS — M2559 Pain in other specified joint: Secondary | ICD-10-CM | POA: Diagnosis not present

## 2011-04-30 DIAGNOSIS — I129 Hypertensive chronic kidney disease with stage 1 through stage 4 chronic kidney disease, or unspecified chronic kidney disease: Secondary | ICD-10-CM | POA: Diagnosis not present

## 2011-04-30 DIAGNOSIS — N183 Chronic kidney disease, stage 3 unspecified: Secondary | ICD-10-CM | POA: Diagnosis not present

## 2011-05-11 NOTE — H&P (View-Only) (Signed)
HOPI: Patient referred  for follow-up/evaluation and treatment of CAD and chest pain.   Chest pain started 2 months  ago.   It is described as pressure.   Chest pain is present withwith activity.   Frequency is about always with activity and 3-4  per week.   Worsening  Yes.   It is located in the middle of the chest region.   It does not radiate to the  arm.   Chest discomfort lasts several minutes.   Chest discomfort is associated with  dyspnea; Denies PND,  Orthopnea;  Diaphoresis;  Palpitations;  Nausea;  vomiting,  Dizziness or  near syncope.   Pain is worse with activity and is easily relieved with rest.   Chest pain is described as being very similar to the chest discomfort prior to patient's prior MI.    The patient has had SOB but no PND or orthopnea.   Denies hemoptysis.  Pain is relieved with rest and sublingual NTG.  Patient had stopped taking all her heart medications including diabetic medications 2-3 years ago.    ROS: Arthritis-yes, life-style limiting-yes;  Cramping of the legs and feet at night or with activity-sometimes; Diabetes-yes, Controlled-yes;  Hypothyroidism-no;  Previous Stroke-no, Previous GI Bleed-no ,  Recent weight change-no.   Other systems negative.     Known allergies: NKDA.   Past Medical History (Major events, hospitalizations, surgeries):  MI/CABG 1997 [facility]).  History of CABGs in 1997.  Cardiac catheterization 02/23/2001 had revealed RCA occluded in the midsegment with patent SVG to RCA.  LAD occluded in the midsegment after the origin of a large diagonal 1 with ostial 80-90% stenosis.  LIMA to LAD is patent.  Left subclavian artery showed a 30% stenosis with mild damping.  And circumflex coronary artery has large OM2,OM3. 80% stenosis at OM2 origin SVG to obtuse marginal was occluded.    Sleep study 01/09/08 (negative for sleep apnea).    Ongoing medical problems: Hypertension. Type II Diabetes Mellitus. Hyperlipidemia. Asthma/Allergies. Arthritis (right  arm/hips/knees). Acid reflux.    Family medical history: Mother died at age 47 from a massive MI. Father died in his late 20's/early 65's from a MI. 1 half-brother has hypertension; diabetes mellitus; h/o MI/CABG at age 26. Half-sister has a heart murmur.    Preventative: Colonoscopy 2008. No endoscopy.    Social history: No smoking. No alcohol. Divorced-2 sons live with her. 5 children.  ALLERGY:       Moderate Lisinopril oral tablet allergy resulting in Cough (local)  Physical Exam: Afebrile, HR 68/min, reg, 170/90 mm Hg. RR 16.  Short stature built and obese bodily habitus, in no acute distress.   Appears stated age.   Alert Ox3 and in no acute distress.   There is no cyanosis.   HEENT: normal limits.   NECK: no JVD noted.  Short neck.  CARDIAC EXAM: S1 S2 normal, no gallop or murmur.    CHEST EXAM: No tenderness of chest wall.   LUNGS: Clear to percuss and auscultate.   No rales or ronchi.    ABDOMEN: No hepatosplenomegaly.   BS normal in all 4 quadrants.   Abdomen is non-tender.    MUSCULOSKELETAL EXAM: Intact with full range of motion in all 4 extremities.   NEUROLOGIC EXAM: Grossly intact without any focal deficits.   Alert O x 3.    EXTREMITY: No edema.   Normal pulses.   VASCULAR EXAM: No skin breakdown.   Carotids soft bruit left.   Extremities: Left  subclavian bruit soft present.   Femoral pulse normal.   Difficult to feel due to bodily habitus. Popliteal pulse normal ; Pedal pulse normal.   No prominent pulse felt in the abdomen.   No varicose veins.  Impression:    1.   Chest pain suggestive of angina pectoris.   Chest pain similar to the discomfort prior to coronary revascularization.     Patient has used 5 sublingual nitroglycerin tablets since last office visit on 03/05/2011.  Otherwise doing well.  No rest pain. ECG 03/05/11: NSR, Normal intervals. Poor R progression. Diffuse non specific T flattening. Exercise sestamibi 04/03/11: 3 min, 3.7 METs. Strongly +ve ischemic EKG  change.  Inferior scar noted. Cannot exclude balanced ischemia. EF 36% 2.   CAD/ASHD  H/O CABGs in 1997 (Cath 2002: LIMA to LAD, SVG to RCA.   SVG to OM occluded.   Native LAD, RCA occluded).   Cardiac catheterization 02/23/2001 had revealed RCA occluded in the midsegment with patent SVG to RCA.   LAD occluded in the midsegment after the origin of a large diagonal 1 with ostial 80-90% stenosis.   LIMA to LAD is patent.   Left subclavian artery showed a 30% stenosis with mild damping.   And circumflex coronary artery has large OM2,OM3.   80% stenosis at OM2 origin SVG to obtuse marginal was occluded.    3.   Carotid stenosis. Carotid Bruit.   Soft.   Left.  Carotid duplex 03/26/11: Mild bilateral stenosis of 50-69%. RICA veloxity 176/47, Lt 148/38 cm/sec Velocity. Needs repeat carotid duplex in 6 months.  4.   PAD/Claudication: No claudication.    Patient complains of bilateral hip pain and leg pain on walking.  Patient has been taking Celebrex for the same.  I cannot exclude the presence of iliac artery disease although she  2+ pedal pulses.  I may try to scan her abdominal aorta during cardiac catheterization.   She also has mild left subclavian artery stenosis with an ostial 30% stenosis.  Equal blood pressure in both the upper extremities. 5.   Hypertension not at goal.     Blood pressure today 170/90 mmHg. 6.   Hyperlipidemia.   Mixed.   7.   Diabetes Mellitus II controlled.   Labs done on 01/22/2011 revealed TSH normal, HbA1c 7.0, total cholesterol 270, triglycerides 200, HDL 52 and LDL 178.   Non-HDL cholesterol was 168.  Electrolytes normal, BUN 14,  S creatinine was normal with eGFR 81.   CBC normal.   Plan:   Mechanism of underlying disease process and action of medications discussed with the patient.   I discussed primary/secondary prevention and also dietary counceling was done.   Calorie restriction was discussed.    Medications: Stopped Celebred due to CAD . But continue other medications.   Will increase the dose of Metoprolol to 50mg  qBID.     Patient continues to have chest discomfort and has used 5 sublingual nitroglycerin since her last office visit on 03/05/2011.  Because of markedly abnormal stress test and her ongoing symptomatology we have opted for cardiac catheterization. Schedule for cardiac catheterization. We discussed regarding risks, benefits, alternatives to this including stress testing, CTA and continued medical therapy. Patient wants to proceed. Understands <1-2% risk of death, stroke, MI, urgent CABG, bleeding, infection, renal failure but not limited to these. Patient's daughter present at bedside.  REFERRALS:       Glendale Chard (Internal Medicine)  Fax: 1 [phone]  Phone: [phone]  No change in the HPI

## 2011-05-11 NOTE — Interval H&P Note (Signed)
History and Physical Interval Note:  05/11/2011 8:26 PM  Lynn Carey  has presented today for surgery, with the diagnosis of cad  The various methods of treatment have been discussed with the patient and family. After consideration of risks, benefits and other options for treatment, the patient has consented to  Procedure(s): PERCUTANEOUS CORONARY STENT INTERVENTION (PCI-S) as a surgical intervention .  The patients' history has been reviewed, patient examined, no change in status, stable for surgery.  I have reviewed the patients' chart and labs.  Questions were answered to the patient's satisfaction.     [facility]  Patient underwent Heart cath 04/21/11 (report) LM stent 2004. LIMA to LAD, SVG to PDA patent. RI, Circ 99% Needs PCI. Have had curb side discussion with Dr. Gilford Raid. Planned PCI to the circumflex coronary artery. All questions answered.

## 2011-05-12 ENCOUNTER — Other Ambulatory Visit: Payer: Self-pay

## 2011-05-12 ENCOUNTER — Ambulatory Visit (HOSPITAL_COMMUNITY)
Admission: RE | Admit: 2011-05-12 | Discharge: 2011-05-12 | Disposition: A | Discharge status: Home / Self Care | Payer: Commercial Managed Care - PPO | Source: Ambulatory Visit | Attending: Cardiology | Admitting: Cardiology

## 2011-05-12 ENCOUNTER — Encounter (HOSPITAL_COMMUNITY)
Admission: RE | Disposition: A | Discharge status: Home / Self Care | Payer: Self-pay | Source: Ambulatory Visit | Attending: Cardiology

## 2011-05-12 DIAGNOSIS — I6529 Occlusion and stenosis of unspecified carotid artery: Secondary | ICD-10-CM | POA: Insufficient documentation

## 2011-05-12 DIAGNOSIS — Z5309 Procedure and treatment not carried out because of other contraindication: Secondary | ICD-10-CM | POA: Insufficient documentation

## 2011-05-12 DIAGNOSIS — I10 Essential (primary) hypertension: Secondary | ICD-10-CM | POA: Insufficient documentation

## 2011-05-12 DIAGNOSIS — K219 Gastro-esophageal reflux disease without esophagitis: Secondary | ICD-10-CM | POA: Insufficient documentation

## 2011-05-12 DIAGNOSIS — M129 Arthropathy, unspecified: Secondary | ICD-10-CM | POA: Insufficient documentation

## 2011-05-12 DIAGNOSIS — I2510 Atherosclerotic heart disease of native coronary artery without angina pectoris: Secondary | ICD-10-CM | POA: Insufficient documentation

## 2011-05-12 DIAGNOSIS — R0609 Other forms of dyspnea: Secondary | ICD-10-CM | POA: Insufficient documentation

## 2011-05-12 DIAGNOSIS — J45909 Unspecified asthma, uncomplicated: Secondary | ICD-10-CM | POA: Insufficient documentation

## 2011-05-12 DIAGNOSIS — I209 Angina pectoris, unspecified: Secondary | ICD-10-CM | POA: Diagnosis not present

## 2011-05-12 DIAGNOSIS — E119 Type 2 diabetes mellitus without complications: Secondary | ICD-10-CM | POA: Insufficient documentation

## 2011-05-12 DIAGNOSIS — R0989 Other specified symptoms and signs involving the circulatory and respiratory systems: Secondary | ICD-10-CM | POA: Insufficient documentation

## 2011-05-12 HISTORY — PX: PERCUTANEOUS CORONARY INTERVENTION-BALLOON ONLY: SHX6014

## 2011-05-12 LAB — GLUCOSE, CAPILLARY
Glucose-Capillary: 104 mg/dL — ABNORMAL HIGH (ref 70–99)
Glucose-Capillary: 99 mg/dL (ref 70–99)

## 2011-05-12 LAB — POCT ACTIVATED CLOTTING TIME: Activated Clotting Time: 408 seconds

## 2011-05-12 SURGERY — PERCUTANEOUS CORONARY INTERVENTION-BALLOON ONLY
Laterality: Right

## 2011-05-12 MED ORDER — NITROGLYCERIN 0.2 MG/ML ON CALL CATH LAB
INTRAVENOUS | Status: AC
  Filled 2011-05-12: qty 1

## 2011-05-12 MED ORDER — SODIUM CHLORIDE 0.9 % IV SOLN
INTRAVENOUS | Status: DC
  Administered 2011-05-12: 08:00:00 via INTRAVENOUS

## 2011-05-12 MED ORDER — HYDROMORPHONE HCL PF 2 MG/ML IJ SOLN
INTRAMUSCULAR | Status: AC
  Filled 2011-05-12: qty 1

## 2011-05-12 MED ORDER — ONDANSETRON HCL 4 MG/2ML IJ SOLN
4.0000 mg | Freq: Once | INTRAMUSCULAR | Status: AC
  Administered 2011-05-12: 4 mg via INTRAVENOUS

## 2011-05-12 MED ORDER — SODIUM CHLORIDE 0.9 % IJ SOLN: 3.0000 mL | INTRAMUSCULAR | Status: DC | PRN

## 2011-05-12 MED ORDER — LIDOCAINE HCL (PF) 1 % IJ SOLN
INTRAMUSCULAR | Status: AC
  Filled 2011-05-12: qty 30

## 2011-05-12 MED ORDER — MIDAZOLAM HCL 2 MG/2ML IJ SOLN
INTRAMUSCULAR | Status: AC
  Filled 2011-05-12: qty 2

## 2011-05-12 MED ORDER — SODIUM CHLORIDE 0.9 % IV SOLN: 250.0000 mL | INTRAVENOUS | Status: DC | PRN

## 2011-05-12 MED ORDER — CEFAZOLIN SODIUM 1-5 GM-% IV SOLN
INTRAVENOUS | Status: AC
  Filled 2011-05-12: qty 50

## 2011-05-12 MED ORDER — SODIUM CHLORIDE 0.9 % IJ SOLN: 3.0000 mL | Freq: Two times a day (BID) | INTRAMUSCULAR | Status: DC

## 2011-05-12 MED ORDER — HEPARIN (PORCINE) IN NACL 2-0.9 UNIT/ML-% IJ SOLN
INTRAMUSCULAR | Status: AC
  Filled 2011-05-12: qty 2000

## 2011-05-12 MED ORDER — SODIUM CHLORIDE 0.9 % IV SOLN: INTRAVENOUS | Status: DC

## 2011-05-12 MED ORDER — ASPIRIN 81 MG PO CHEW
324.0000 mg | CHEWABLE_TABLET | ORAL | Status: AC
  Administered 2011-05-12: 243 mg via ORAL
  Filled 2011-05-12: qty 3

## 2011-05-12 MED ORDER — BIVALIRUDIN 250 MG IV SOLR
INTRAVENOUS | Status: AC
  Filled 2011-05-12: qty 250

## 2011-05-12 MED ORDER — ACETAMINOPHEN 325 MG PO TABS: 650.0000 mg | ORAL_TABLET | ORAL | Status: DC | PRN

## 2011-05-12 MED ORDER — NITROGLYCERIN 0.4 MG SL SUBL
SUBLINGUAL_TABLET | SUBLINGUAL | Status: AC
  Administered 2011-05-12: 13:00:00
  Filled 2011-05-12: qty 25

## 2011-05-12 MED ORDER — ONDANSETRON HCL 4 MG/2ML IJ SOLN: 4.0000 mg | Freq: Four times a day (QID) | INTRAMUSCULAR | Status: DC | PRN

## 2011-05-12 MED ORDER — ONDANSETRON HCL 4 MG/2ML IJ SOLN
INTRAMUSCULAR | Status: AC
  Filled 2011-05-12: qty 2

## 2011-05-12 NOTE — Cardiovascular Report (Signed)
NAMERONNY, REICHELT               ACCOUNT NO.:  [id number]  MEDICAL RECORD NO.:  [id number]  LOCATION:  [facility]                         FACILITY:  [facility]  PHYSICIAN:  Laverda Page, MD DATE OF BIRTH:  [birth year]  DATE OF PROCEDURE:  05/12/2011 DATE OF DISCHARGE:  05/12/2011                           CARDIAC CATHETERIZATION   REFERRING PHYSICIAN:  Arlyss Repress, M.D.  PROCEDURE PERFORMED: 1. Left coronary arteriography. 2. PTCA and attempted angioplasty of the left main and circumflex,     ramus intermediate bifurcation.  INDICATION:  Ms Meckstroth is a very pleasant 71 year old female with a history of known coronary artery disease and coronary artery bypass grafting.  She had undergone coronary angiography on April 21, 2011 and was found to have unprotected circumflex and ramus intermediate high- grade stenosis followed along with the left main stenosis.  She has previously placed stent in the distal left main.  She has LIMA to LAD was patent.  Given this, she was felt to be a candidate for percutaneous revascularization.  She has been having class 3 angina pectoris using anywhere from 3-5 sublingual nitroglycerin on a daily basis.  Hence, she is brought for elective attempted angioplasty to the circumflex coronary artery and ramus intermediate branch along with left main coronary artery angioplasty.  INTERVENTION DATA:  Unsuccessful attempted crossing the circumflex coronary artery ostium.  The lesion had an acute angle with the severe coronary calcification.  In spite of utilization of multiple guidewires including Intuition, ProGlide, Prowater, BMW, Fielder XT, PT2 LS, I was unable to cross through the circumflex coronary artery stenosis.  Hence at this point, the left main stenosis and the ramus intermediate stenosis was left alone.  RECOMMENDATION:  The patient will be tried on continued medical therapy. I will discuss with her regarding options for coronary artery  bypass grafting.  A total of about 90 mL of contrast was utilized for diagnostic and intervention procedure.  TECHNIQUE OF PROCEDURE:  Under sterile precautions using a 6-French right femoral artery access, a 6-French XB 3.5 guide catheter was utilized to engage left main coronary artery.  Next using Angiomax for anticoagulation, I attempted to cross through the circumflex coronary artery lesion.  Multiple attempts were made.  I also asked Dr. Bing Quarry to give me his opinion in the middle of the case.  We felt that the anatomy was not very conducive.  After having even approximately 30 minutes of fluoro-time for attempted angioplasty, the lesion was abandoned.  There were no immediate complications noted.  The patient tolerated the procedure well.  The patient remained asymptomatic at the end of the procedure.  TECHNIQUE OF PROCEDURE:  Under sterile precautions using a 6-French right femoral artery access, a 6-French XB 3.5 guide catheter was advanced to the ascending aorta.  Left main coronary artery selectively engaged.  Angiography was performed.  Then, using Angiomax for anticoagulation, multiple attempts at passing the wire was performed into the circumflex coronary artery.  Because of inability to do so, the guidewire was withdrawn, angiography repeated, guide catheter disengaged and pulled out of body.  Intracoronary nitroglycerin was administered at various intervals.  Right femoral arteriography was performed through the arterial access sheath  and access was closed with Perclose with excellent hemostasis. The patient tolerated the procedure.  No immediate complications noted.     Laverda Page, MD     JRG/MEDQ  D:  05/12/2011  T:  05/12/2011  Job:  [id number]  cc:   Arlyss Repress, MD Theda Belfast Baird Cancer, M.D.

## 2011-05-12 NOTE — Progress Notes (Signed)
Called to Short Stay to start 2nd peripheral iv for pt; staff has started 1 site in the right hand; att x 1 to place 2nd site without success; poor peripheral access; RN aware;  Will send pt to cath lab with 1 access;

## 2011-05-12 NOTE — Interval H&P Note (Signed)
History and Physical Interval Note:  05/12/2011 10:04 AM  Lynn Carey  has presented today for surgery, with the diagnosis of cad  The various methods of treatment have been discussed with the patient and family. After consideration of risks, benefits and other options for treatment, the patient has consented to  Procedure(s): PERCUTANEOUS CORONARY STENT INTERVENTION (PCI-S) as a surgical intervention .  The patients' history has been reviewed, patient examined, no change in status, stable for surgery.  I have reviewed the patients' chart and labs.  Questions were answered to the patient's satisfaction.     Vallery Mcdade,JAGADEESH R  No change in HPI since last HPI. Patient for scheduled PCI to Circumflex and LM

## 2011-05-12 NOTE — Op Note (Signed)
Dictation number [id number]

## 2011-05-13 DIAGNOSIS — I2510 Atherosclerotic heart disease of native coronary artery without angina pectoris: Secondary | ICD-10-CM | POA: Diagnosis not present

## 2011-05-14 MED FILL — Dextrose Inj 5%: INTRAVENOUS | Qty: 50 | Status: AC

## 2011-05-21 DIAGNOSIS — I2510 Atherosclerotic heart disease of native coronary artery without angina pectoris: Secondary | ICD-10-CM | POA: Diagnosis not present

## 2011-05-21 DIAGNOSIS — Z951 Presence of aortocoronary bypass graft: Secondary | ICD-10-CM | POA: Diagnosis not present

## 2011-05-21 DIAGNOSIS — E782 Mixed hyperlipidemia: Secondary | ICD-10-CM | POA: Diagnosis not present

## 2011-05-21 DIAGNOSIS — I209 Angina pectoris, unspecified: Secondary | ICD-10-CM | POA: Diagnosis not present

## 2011-06-18 DIAGNOSIS — E782 Mixed hyperlipidemia: Secondary | ICD-10-CM | POA: Diagnosis not present

## 2011-06-18 DIAGNOSIS — I209 Angina pectoris, unspecified: Secondary | ICD-10-CM | POA: Diagnosis not present

## 2011-06-18 DIAGNOSIS — I2510 Atherosclerotic heart disease of native coronary artery without angina pectoris: Secondary | ICD-10-CM | POA: Diagnosis not present

## 2011-06-18 DIAGNOSIS — Z951 Presence of aortocoronary bypass graft: Secondary | ICD-10-CM | POA: Diagnosis not present

## 2011-07-13 DIAGNOSIS — R05 Cough: Secondary | ICD-10-CM | POA: Diagnosis not present

## 2011-07-13 DIAGNOSIS — R059 Cough, unspecified: Secondary | ICD-10-CM | POA: Diagnosis not present

## 2011-07-13 DIAGNOSIS — J019 Acute sinusitis, unspecified: Secondary | ICD-10-CM | POA: Diagnosis not present

## 2011-07-30 DIAGNOSIS — E1165 Type 2 diabetes mellitus with hyperglycemia: Secondary | ICD-10-CM | POA: Diagnosis not present

## 2011-07-30 DIAGNOSIS — E1129 Type 2 diabetes mellitus with other diabetic kidney complication: Secondary | ICD-10-CM | POA: Diagnosis not present

## 2011-07-30 DIAGNOSIS — I129 Hypertensive chronic kidney disease with stage 1 through stage 4 chronic kidney disease, or unspecified chronic kidney disease: Secondary | ICD-10-CM | POA: Diagnosis not present

## 2011-07-30 DIAGNOSIS — J309 Allergic rhinitis, unspecified: Secondary | ICD-10-CM | POA: Diagnosis not present

## 2011-07-30 DIAGNOSIS — N183 Chronic kidney disease, stage 3 unspecified: Secondary | ICD-10-CM | POA: Diagnosis not present

## 2011-11-10 DIAGNOSIS — M25569 Pain in unspecified knee: Secondary | ICD-10-CM | POA: Diagnosis not present

## 2011-11-10 DIAGNOSIS — M25559 Pain in unspecified hip: Secondary | ICD-10-CM | POA: Diagnosis not present

## 2011-11-12 DIAGNOSIS — I129 Hypertensive chronic kidney disease with stage 1 through stage 4 chronic kidney disease, or unspecified chronic kidney disease: Secondary | ICD-10-CM | POA: Diagnosis not present

## 2011-11-12 DIAGNOSIS — J45901 Unspecified asthma with (acute) exacerbation: Secondary | ICD-10-CM | POA: Diagnosis not present

## 2011-11-12 DIAGNOSIS — E1129 Type 2 diabetes mellitus with other diabetic kidney complication: Secondary | ICD-10-CM | POA: Diagnosis not present

## 2011-11-12 DIAGNOSIS — N183 Chronic kidney disease, stage 3 unspecified: Secondary | ICD-10-CM | POA: Diagnosis not present

## 2011-11-12 DIAGNOSIS — E1165 Type 2 diabetes mellitus with hyperglycemia: Secondary | ICD-10-CM | POA: Diagnosis not present

## 2011-12-31 DIAGNOSIS — J309 Allergic rhinitis, unspecified: Secondary | ICD-10-CM | POA: Diagnosis not present

## 2011-12-31 DIAGNOSIS — J3489 Other specified disorders of nose and nasal sinuses: Secondary | ICD-10-CM | POA: Diagnosis not present

## 2011-12-31 DIAGNOSIS — J45909 Unspecified asthma, uncomplicated: Secondary | ICD-10-CM | POA: Diagnosis not present

## 2012-01-28 DIAGNOSIS — N183 Chronic kidney disease, stage 3 unspecified: Secondary | ICD-10-CM | POA: Diagnosis not present

## 2012-01-28 DIAGNOSIS — I129 Hypertensive chronic kidney disease with stage 1 through stage 4 chronic kidney disease, or unspecified chronic kidney disease: Secondary | ICD-10-CM | POA: Diagnosis not present

## 2012-01-28 DIAGNOSIS — E1129 Type 2 diabetes mellitus with other diabetic kidney complication: Secondary | ICD-10-CM | POA: Diagnosis not present

## 2012-01-28 DIAGNOSIS — E1165 Type 2 diabetes mellitus with hyperglycemia: Secondary | ICD-10-CM | POA: Diagnosis not present

## 2012-01-28 DIAGNOSIS — I2510 Atherosclerotic heart disease of native coronary artery without angina pectoris: Secondary | ICD-10-CM | POA: Diagnosis not present

## 2012-03-07 DIAGNOSIS — K219 Gastro-esophageal reflux disease without esophagitis: Secondary | ICD-10-CM | POA: Diagnosis not present

## 2012-03-07 DIAGNOSIS — R059 Cough, unspecified: Secondary | ICD-10-CM | POA: Diagnosis not present

## 2012-03-07 DIAGNOSIS — R05 Cough: Secondary | ICD-10-CM | POA: Diagnosis not present

## 2012-03-08 ENCOUNTER — Ambulatory Visit
Admission: RE | Admit: 2012-03-08 | Discharge: 2012-03-08 | Disposition: A | Discharge status: Home / Self Care | Payer: Commercial Managed Care - PPO | Source: Ambulatory Visit | Attending: Otolaryngology | Admitting: Otolaryngology

## 2012-03-08 ENCOUNTER — Other Ambulatory Visit: Payer: Self-pay | Admitting: Otolaryngology

## 2012-03-08 DIAGNOSIS — R059 Cough, unspecified: Secondary | ICD-10-CM | POA: Diagnosis not present

## 2012-03-08 DIAGNOSIS — R05 Cough: Secondary | ICD-10-CM

## 2012-05-07 DIAGNOSIS — E1129 Type 2 diabetes mellitus with other diabetic kidney complication: Secondary | ICD-10-CM | POA: Diagnosis not present

## 2012-05-07 DIAGNOSIS — E1165 Type 2 diabetes mellitus with hyperglycemia: Secondary | ICD-10-CM | POA: Diagnosis not present

## 2012-05-07 DIAGNOSIS — I129 Hypertensive chronic kidney disease with stage 1 through stage 4 chronic kidney disease, or unspecified chronic kidney disease: Secondary | ICD-10-CM | POA: Diagnosis not present

## 2012-05-07 DIAGNOSIS — J45909 Unspecified asthma, uncomplicated: Secondary | ICD-10-CM | POA: Diagnosis not present

## 2012-05-07 DIAGNOSIS — N182 Chronic kidney disease, stage 2 (mild): Secondary | ICD-10-CM | POA: Diagnosis not present

## 2012-05-07 HISTORY — PX: CARDIAC CATHETERIZATION: SHX172

## 2012-05-09 DIAGNOSIS — R059 Cough, unspecified: Secondary | ICD-10-CM | POA: Diagnosis not present

## 2012-05-09 DIAGNOSIS — R05 Cough: Secondary | ICD-10-CM | POA: Diagnosis not present

## 2012-06-23 DIAGNOSIS — E119 Type 2 diabetes mellitus without complications: Secondary | ICD-10-CM | POA: Diagnosis not present

## 2012-08-12 DIAGNOSIS — N182 Chronic kidney disease, stage 2 (mild): Secondary | ICD-10-CM | POA: Diagnosis not present

## 2012-08-12 DIAGNOSIS — I129 Hypertensive chronic kidney disease with stage 1 through stage 4 chronic kidney disease, or unspecified chronic kidney disease: Secondary | ICD-10-CM | POA: Diagnosis not present

## 2012-08-12 DIAGNOSIS — E1129 Type 2 diabetes mellitus with other diabetic kidney complication: Secondary | ICD-10-CM | POA: Diagnosis not present

## 2012-08-12 DIAGNOSIS — R209 Unspecified disturbances of skin sensation: Secondary | ICD-10-CM | POA: Diagnosis not present

## 2012-08-12 DIAGNOSIS — E1165 Type 2 diabetes mellitus with hyperglycemia: Secondary | ICD-10-CM | POA: Diagnosis not present

## 2012-10-19 DIAGNOSIS — M25559 Pain in unspecified hip: Secondary | ICD-10-CM | POA: Diagnosis not present

## 2012-10-19 DIAGNOSIS — M169 Osteoarthritis of hip, unspecified: Secondary | ICD-10-CM | POA: Diagnosis not present

## 2012-10-19 DIAGNOSIS — M25569 Pain in unspecified knee: Secondary | ICD-10-CM | POA: Diagnosis not present

## 2012-11-02 DIAGNOSIS — M25559 Pain in unspecified hip: Secondary | ICD-10-CM | POA: Diagnosis not present

## 2012-11-25 DIAGNOSIS — E1129 Type 2 diabetes mellitus with other diabetic kidney complication: Secondary | ICD-10-CM | POA: Diagnosis not present

## 2012-11-25 DIAGNOSIS — I129 Hypertensive chronic kidney disease with stage 1 through stage 4 chronic kidney disease, or unspecified chronic kidney disease: Secondary | ICD-10-CM | POA: Diagnosis not present

## 2012-11-25 DIAGNOSIS — M67919 Unspecified disorder of synovium and tendon, unspecified shoulder: Secondary | ICD-10-CM | POA: Diagnosis not present

## 2012-11-25 DIAGNOSIS — N182 Chronic kidney disease, stage 2 (mild): Secondary | ICD-10-CM | POA: Diagnosis not present

## 2012-11-30 ENCOUNTER — Other Ambulatory Visit: Payer: Self-pay | Admitting: Orthopaedic Surgery

## 2012-12-07 ENCOUNTER — Encounter (HOSPITAL_COMMUNITY): Payer: Self-pay

## 2012-12-07 ENCOUNTER — Encounter (HOSPITAL_COMMUNITY)
Admission: RE | Admit: 2012-12-07 | Discharge: 2012-12-07 | Disposition: A | Discharge status: Home / Self Care | Payer: Commercial Managed Care - PPO | Source: Ambulatory Visit | Attending: Orthopaedic Surgery | Admitting: Orthopaedic Surgery

## 2012-12-07 ENCOUNTER — Encounter (HOSPITAL_COMMUNITY): Payer: Self-pay | Admitting: Pharmacy Technician

## 2012-12-07 DIAGNOSIS — Z01812 Encounter for preprocedural laboratory examination: Secondary | ICD-10-CM | POA: Diagnosis not present

## 2012-12-07 DIAGNOSIS — Z01818 Encounter for other preprocedural examination: Secondary | ICD-10-CM | POA: Diagnosis not present

## 2012-12-07 HISTORY — DX: Angina pectoris, unspecified: I20.9

## 2012-12-07 HISTORY — DX: Other specified postprocedural states: Z98.890

## 2012-12-07 HISTORY — DX: Other complications of anesthesia, initial encounter: T88.59XA

## 2012-12-07 HISTORY — DX: Unspecified asthma, uncomplicated: J45.909

## 2012-12-07 HISTORY — DX: Unspecified osteoarthritis, unspecified site: M19.90

## 2012-12-07 HISTORY — DX: Gastro-esophageal reflux disease without esophagitis: K21.9

## 2012-12-07 HISTORY — DX: Type 2 diabetes mellitus without complications: E11.9

## 2012-12-07 HISTORY — DX: Atherosclerotic heart disease of native coronary artery without angina pectoris: I25.10

## 2012-12-07 HISTORY — DX: Essential (primary) hypertension: I10

## 2012-12-07 HISTORY — DX: Other specified postprocedural states: R11.2

## 2012-12-07 HISTORY — DX: Shortness of breath: R06.02

## 2012-12-07 HISTORY — DX: Adverse effect of unspecified anesthetic, initial encounter: T41.45XA

## 2012-12-07 HISTORY — DX: Acute myocardial infarction, unspecified: I21.9

## 2012-12-07 LAB — CBC WITH DIFFERENTIAL/PLATELET
Basophils Absolute: 0 10*3/uL (ref 0.0–0.1)
Basophils Relative: 0 % (ref 0–1)
Eosinophils Absolute: 0.2 10*3/uL (ref 0.0–0.7)
Eosinophils Relative: 2 % (ref 0–5)
HCT: 37.9 % (ref 36.0–46.0)
Hemoglobin: 12.3 g/dL (ref 12.0–15.0)
Lymphocytes Relative: 26 % (ref 12–46)
Lymphs Abs: 2.3 10*3/uL (ref 0.7–4.0)
MCH: 28.2 pg (ref 26.0–34.0)
MCHC: 32.5 g/dL (ref 30.0–36.0)
MCV: 86.9 fL (ref 78.0–100.0)
Monocytes Absolute: 0.6 10*3/uL (ref 0.1–1.0)
Monocytes Relative: 7 % (ref 3–12)
Neutro Abs: 5.5 10*3/uL (ref 1.7–7.7)
Neutrophils Relative %: 64 % (ref 43–77)
Platelets: 381 10*3/uL (ref 150–400)
RBC: 4.36 MIL/uL (ref 3.87–5.11)
RDW: 14 % (ref 11.5–15.5)
WBC: 8.6 10*3/uL (ref 4.0–10.5)

## 2012-12-07 LAB — URINALYSIS, ROUTINE W REFLEX MICROSCOPIC
Glucose, UA: 100 mg/dL — AB
Hgb urine dipstick: NEGATIVE
Ketones, ur: NEGATIVE mg/dL
Leukocytes, UA: NEGATIVE
Nitrite: NEGATIVE
Protein, ur: NEGATIVE mg/dL
Specific Gravity, Urine: 1.026 (ref 1.005–1.030)
Urobilinogen, UA: 1 mg/dL (ref 0.0–1.0)
pH: 5 (ref 5.0–8.0)

## 2012-12-07 LAB — BASIC METABOLIC PANEL
BUN: 19 mg/dL (ref 6–23)
CO2: 26 mEq/L (ref 19–32)
Calcium: 9.7 mg/dL (ref 8.4–10.5)
Chloride: 104 mEq/L (ref 96–112)
Creatinine, Ser: 0.96 mg/dL (ref 0.50–1.10)
GFR calc Af Amer: 67 mL/min — ABNORMAL LOW (ref >90)
GFR calc non Af Amer: 58 mL/min — ABNORMAL LOW (ref >90)
Glucose, Bld: 138 mg/dL — ABNORMAL HIGH (ref 70–99)
Potassium: 3.9 mEq/L (ref 3.5–5.1)
Sodium: 139 mEq/L (ref 135–145)

## 2012-12-07 LAB — ABO/RH: ABO/RH(D): B POS

## 2012-12-07 LAB — TYPE AND SCREEN
ABO/RH(D): B POS
Antibody Screen: NEGATIVE

## 2012-12-07 LAB — APTT: aPTT: 28 seconds (ref 24–37)

## 2012-12-07 LAB — SURGICAL PCR SCREEN
MRSA, PCR: NEGATIVE
Staphylococcus aureus: NEGATIVE

## 2012-12-07 LAB — PROTIME-INR
INR: 1.08 (ref 0.00–1.49)
Prothrombin Time: 13.8 seconds (ref 11.6–15.2)

## 2012-12-07 NOTE — Pre-Procedure Instructions (Signed)
Lynn Carey  12/07/2012   Your procedure is scheduled on:  12/13/2012  Report to [facility] at 5:30 AM.  Call this number if you have problems the morning of surgery: [phone]   Remember:   Do not eat food or drink liquids after midnight. ON MONDAY  Take these medicines the morning of surgery with A SIP OF WATER: NEXIUM, pain medicine is OK if needed ,metoprolol, Ranexa   Do not wear jewelry, make-up or nail polish.  Do not wear lotions, powders, or perfumes. You may wear deodorant.  Do not shave 48 hours prior to surgery.   Do not bring valuables to the hospital.  [facility] is not responsible                   for any belongings or valuables.  Contacts, dentures or bridgework may not be worn into surgery.  Leave suitcase in the car. After surgery it may be brought to your room.  For patients admitted to the hospital, checkout time is 11:00 AM the day of  discharge.   Patients discharged the day of surgery will not be allowed to drive  home.  Name and phone number of your driver: with family  Special Instructions: Shower using CHG 2 nights before surgery and the night before surgery.  If you shower the day of surgery use CHG.  Use special wash - you have one bottle of CHG for all showers.  You should use approximately 1/3 of the bottle for each shower.   Please read over the following fact sheets that you were given: Pain Booklet, Coughing and Deep Breathing, Blood Transfusion Information, MRSA Information and Surgical Site Infection Prevention

## 2012-12-07 NOTE — Progress Notes (Signed)
Awaiting fax from Dr. Einar Gip office.

## 2012-12-07 NOTE — Progress Notes (Signed)
Call 2 times to Dr. Irven Shelling office to get the faxed documents that we are awaiting
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
## 2012-12-12 MED ORDER — CEFAZOLIN SODIUM-DEXTROSE 2-3 GM-% IV SOLR
2.0000 g | INTRAVENOUS | Status: AC
  Administered 2012-12-13: 2 g via INTRAVENOUS
  Filled 2012-12-12: qty 50

## 2012-12-13 ENCOUNTER — Inpatient Hospital Stay (HOSPITAL_COMMUNITY): Payer: Commercial Managed Care - PPO | Admitting: Certified Registered Nurse Anesthetist

## 2012-12-13 ENCOUNTER — Encounter (HOSPITAL_COMMUNITY): Payer: Self-pay | Admitting: *Deleted

## 2012-12-13 ENCOUNTER — Inpatient Hospital Stay (HOSPITAL_COMMUNITY)
Admission: RE | Admit: 2012-12-13 | Discharge: 2012-12-16 | DRG: 470 | Disposition: A | Discharge status: Skilled Nursing Facility | Payer: Commercial Managed Care - PPO | Source: Ambulatory Visit | Attending: Orthopaedic Surgery | Admitting: Orthopaedic Surgery

## 2012-12-13 ENCOUNTER — Encounter (HOSPITAL_COMMUNITY)
Admission: RE | Disposition: A | Discharge status: Skilled Nursing Facility | Payer: Self-pay | Source: Ambulatory Visit | Attending: Orthopaedic Surgery

## 2012-12-13 ENCOUNTER — Inpatient Hospital Stay (HOSPITAL_COMMUNITY): Payer: Commercial Managed Care - PPO

## 2012-12-13 ENCOUNTER — Encounter (HOSPITAL_COMMUNITY): Payer: Self-pay | Admitting: Certified Registered Nurse Anesthetist

## 2012-12-13 DIAGNOSIS — Z9851 Tubal ligation status: Secondary | ICD-10-CM | POA: Diagnosis not present

## 2012-12-13 DIAGNOSIS — I10 Essential (primary) hypertension: Secondary | ICD-10-CM | POA: Diagnosis not present

## 2012-12-13 DIAGNOSIS — I2510 Atherosclerotic heart disease of native coronary artery without angina pectoris: Secondary | ICD-10-CM | POA: Diagnosis present

## 2012-12-13 DIAGNOSIS — E785 Hyperlipidemia, unspecified: Secondary | ICD-10-CM | POA: Diagnosis present

## 2012-12-13 DIAGNOSIS — Z79899 Other long term (current) drug therapy: Secondary | ICD-10-CM

## 2012-12-13 DIAGNOSIS — K219 Gastro-esophageal reflux disease without esophagitis: Secondary | ICD-10-CM | POA: Diagnosis not present

## 2012-12-13 DIAGNOSIS — M25559 Pain in unspecified hip: Secondary | ICD-10-CM | POA: Diagnosis not present

## 2012-12-13 DIAGNOSIS — Z23 Encounter for immunization: Secondary | ICD-10-CM | POA: Diagnosis not present

## 2012-12-13 DIAGNOSIS — J45909 Unspecified asthma, uncomplicated: Secondary | ICD-10-CM | POA: Diagnosis present

## 2012-12-13 DIAGNOSIS — Z87891 Personal history of nicotine dependence: Secondary | ICD-10-CM

## 2012-12-13 DIAGNOSIS — I252 Old myocardial infarction: Secondary | ICD-10-CM | POA: Diagnosis not present

## 2012-12-13 DIAGNOSIS — E119 Type 2 diabetes mellitus without complications: Secondary | ICD-10-CM | POA: Diagnosis present

## 2012-12-13 DIAGNOSIS — Z7982 Long term (current) use of aspirin: Secondary | ICD-10-CM | POA: Diagnosis not present

## 2012-12-13 DIAGNOSIS — Z951 Presence of aortocoronary bypass graft: Secondary | ICD-10-CM | POA: Diagnosis not present

## 2012-12-13 DIAGNOSIS — M169 Osteoarthritis of hip, unspecified: Secondary | ICD-10-CM | POA: Diagnosis present

## 2012-12-13 DIAGNOSIS — M1610 Unilateral primary osteoarthritis, unspecified hip: Principal | ICD-10-CM | POA: Diagnosis present

## 2012-12-13 DIAGNOSIS — Z96649 Presence of unspecified artificial hip joint: Secondary | ICD-10-CM | POA: Diagnosis not present

## 2012-12-13 HISTORY — PX: TOTAL HIP ARTHROPLASTY: SHX124

## 2012-12-13 HISTORY — PX: JOINT REPLACEMENT: SHX530

## 2012-12-13 LAB — GLUCOSE, CAPILLARY
Glucose-Capillary: 99 mg/dL (ref 70–99)
Glucose-Capillary: 151 mg/dL — ABNORMAL HIGH (ref 70–99)
Glucose-Capillary: 130 mg/dL — ABNORMAL HIGH (ref 70–99)
Glucose-Capillary: 158 mg/dL — ABNORMAL HIGH (ref 70–99)
Glucose-Capillary: 141 mg/dL — ABNORMAL HIGH (ref 70–99)

## 2012-12-13 SURGERY — ARTHROPLASTY, HIP, TOTAL, ANTERIOR APPROACH
Anesthesia: General | Site: Hip | Laterality: Left | Wound class: Clean

## 2012-12-13 MED ORDER — PROPOFOL 10 MG/ML IV BOLUS
INTRAVENOUS | Status: DC | PRN
  Administered 2012-12-13: 120 mg via INTRAVENOUS

## 2012-12-13 MED ORDER — CEFAZOLIN SODIUM-DEXTROSE 2-3 GM-% IV SOLR
2.0000 g | Freq: Four times a day (QID) | INTRAVENOUS | Status: AC
  Administered 2012-12-13 (×2): 2 g via INTRAVENOUS
  Filled 2012-12-13 (×2): qty 50

## 2012-12-13 MED ORDER — HYDROCODONE-ACETAMINOPHEN 5-325 MG PO TABS
1.0000 | ORAL_TABLET | ORAL | Status: DC | PRN
  Administered 2012-12-13 – 2012-12-14 (×5): 1 via ORAL
  Administered 2012-12-15: 2 via ORAL
  Administered 2012-12-15 – 2012-12-16 (×2): 1 via ORAL
  Filled 2012-12-13: qty 2
  Filled 2012-12-13 (×4): qty 1
  Filled 2012-12-13 (×2): qty 2
  Filled 2012-12-13: qty 1

## 2012-12-13 MED ORDER — METOPROLOL SUCCINATE ER 25 MG PO TB24
25.0000 mg | ORAL_TABLET | Freq: Every day | ORAL | Status: DC
  Administered 2012-12-13 – 2012-12-16 (×4): 25 mg via ORAL
  Filled 2012-12-13 (×4): qty 1

## 2012-12-13 MED ORDER — POTASSIUM CHLORIDE 2 MEQ/ML IV SOLN
INTRAVENOUS | Status: DC
  Administered 2012-12-13 – 2012-12-14 (×2): via INTRAVENOUS
  Filled 2012-12-13 (×3): qty 1000

## 2012-12-13 MED ORDER — ONDANSETRON HCL 4 MG/2ML IJ SOLN: 4.0000 mg | Freq: Once | INTRAMUSCULAR | Status: DC | PRN

## 2012-12-13 MED ORDER — PHENOL 1.4 % MT LIQD: 1.0000 | OROMUCOSAL | Status: DC | PRN

## 2012-12-13 MED ORDER — ONDANSETRON HCL 4 MG/2ML IJ SOLN: 4.0000 mg | Freq: Four times a day (QID) | INTRAMUSCULAR | Status: DC | PRN

## 2012-12-13 MED ORDER — RANOLAZINE ER 500 MG PO TB12
500.0000 mg | ORAL_TABLET | Freq: Two times a day (BID) | ORAL | Status: DC
  Administered 2012-12-13 – 2012-12-16 (×6): 500 mg via ORAL
  Filled 2012-12-13 (×7): qty 1

## 2012-12-13 MED ORDER — DEXAMETHASONE SODIUM PHOSPHATE 4 MG/ML IJ SOLN
INTRAMUSCULAR | Status: DC | PRN
  Administered 2012-12-13: 4 mg via INTRAVENOUS

## 2012-12-13 MED ORDER — HYDROCHLOROTHIAZIDE 25 MG PO TABS
25.0000 mg | ORAL_TABLET | Freq: Every day | ORAL | Status: DC
  Administered 2012-12-14 – 2012-12-16 (×3): 25 mg via ORAL
  Filled 2012-12-13 (×3): qty 1

## 2012-12-13 MED ORDER — METOPROLOL TARTRATE 50 MG PO TABS
25.0000 mg | ORAL_TABLET | Freq: Once | ORAL | Status: AC
  Administered 2012-12-13: 25 mg via ORAL
  Filled 2012-12-13: qty 1

## 2012-12-13 MED ORDER — ATORVASTATIN CALCIUM 40 MG PO TABS
40.0000 mg | ORAL_TABLET | Freq: Every day | ORAL | Status: DC
  Administered 2012-12-13 – 2012-12-15 (×2): 40 mg via ORAL
  Filled 2012-12-13 (×4): qty 1

## 2012-12-13 MED ORDER — MENTHOL 3 MG MT LOZG: 1.0000 | LOZENGE | OROMUCOSAL | Status: DC | PRN

## 2012-12-13 MED ORDER — OXYCODONE HCL 5 MG/5ML PO SOLN: 5.0000 mg | Freq: Once | ORAL | Status: DC | PRN

## 2012-12-13 MED ORDER — OXYCODONE HCL 5 MG PO TABS: 5.0000 mg | ORAL_TABLET | Freq: Once | ORAL | Status: DC | PRN

## 2012-12-13 MED ORDER — PANTOPRAZOLE SODIUM 40 MG PO TBEC
80.0000 mg | DELAYED_RELEASE_TABLET | Freq: Every day | ORAL | Status: DC
  Administered 2012-12-14 – 2012-12-16 (×3): 80 mg via ORAL
  Filled 2012-12-13: qty 2
  Filled 2012-12-13 (×2): qty 1
  Filled 2012-12-13: qty 2

## 2012-12-13 MED ORDER — ARTIFICIAL TEARS OP OINT
TOPICAL_OINTMENT | OPHTHALMIC | Status: DC | PRN
  Administered 2012-12-13: 1 via OPHTHALMIC

## 2012-12-13 MED ORDER — DEXTROSE 5 % IV SOLN: 500.0000 mg | Freq: Four times a day (QID) | INTRAVENOUS | Status: DC | PRN

## 2012-12-13 MED ORDER — ROCURONIUM BROMIDE 100 MG/10ML IV SOLN
INTRAVENOUS | Status: DC | PRN
  Administered 2012-12-13: 30 mg via INTRAVENOUS

## 2012-12-13 MED ORDER — OLMESARTAN-AMLODIPINE-HCTZ 40-10-25 MG PO TABS: 1.0000 | ORAL_TABLET | Freq: Every day | ORAL | Status: DC

## 2012-12-13 MED ORDER — FERROUS SULFATE 325 (65 FE) MG PO TABS
325.0000 mg | ORAL_TABLET | Freq: Every day | ORAL | Status: DC
  Administered 2012-12-14 – 2012-12-16 (×3): 325 mg via ORAL
  Filled 2012-12-13 (×4): qty 1

## 2012-12-13 MED ORDER — LACTATED RINGERS IV SOLN: INTRAVENOUS | Status: DC

## 2012-12-13 MED ORDER — ONDANSETRON HCL 4 MG PO TABS: 4.0000 mg | ORAL_TABLET | Freq: Four times a day (QID) | ORAL | Status: DC | PRN

## 2012-12-13 MED ORDER — METOCLOPRAMIDE HCL 10 MG PO TABS: 5.0000 mg | ORAL_TABLET | Freq: Three times a day (TID) | ORAL | Status: DC | PRN

## 2012-12-13 MED ORDER — METHOCARBAMOL 500 MG PO TABS: 500.0000 mg | ORAL_TABLET | Freq: Four times a day (QID) | ORAL | Status: DC | PRN

## 2012-12-13 MED ORDER — NEOSTIGMINE METHYLSULFATE 1 MG/ML IJ SOLN
INTRAMUSCULAR | Status: DC | PRN
  Administered 2012-12-13: 3 mg via INTRAVENOUS

## 2012-12-13 MED ORDER — ALBUTEROL SULFATE HFA 108 (90 BASE) MCG/ACT IN AERS
2.0000 | INHALATION_SPRAY | RESPIRATORY_TRACT | Status: DC | PRN
  Administered 2012-12-14: 2 via RESPIRATORY_TRACT
  Filled 2012-12-13 (×2): qty 6.7

## 2012-12-13 MED ORDER — BISACODYL 10 MG RE SUPP: 10.0000 mg | Freq: Every day | RECTAL | Status: DC | PRN

## 2012-12-13 MED ORDER — ALUM & MAG HYDROXIDE-SIMETH 200-200-20 MG/5ML PO SUSP: 30.0000 mL | ORAL | Status: DC | PRN

## 2012-12-13 MED ORDER — GLYCOPYRROLATE 0.2 MG/ML IJ SOLN
INTRAMUSCULAR | Status: DC | PRN
  Administered 2012-12-13: 0.4 mg via INTRAVENOUS

## 2012-12-13 MED ORDER — 0.9 % SODIUM CHLORIDE (POUR BTL) OPTIME
TOPICAL | Status: DC | PRN
  Administered 2012-12-13: 1000 mL

## 2012-12-13 MED ORDER — NITROGLYCERIN 0.4 MG SL SUBL
0.4000 mg | SUBLINGUAL_TABLET | SUBLINGUAL | Status: DC | PRN
  Administered 2012-12-14 (×3): 0.4 mg via SUBLINGUAL
  Filled 2012-12-13: qty 25

## 2012-12-13 MED ORDER — EZETIMIBE 10 MG PO TABS
10.0000 mg | ORAL_TABLET | Freq: Every day | ORAL | Status: DC
  Administered 2012-12-14 – 2012-12-16 (×3): 10 mg via ORAL
  Filled 2012-12-13 (×4): qty 1

## 2012-12-13 MED ORDER — LINAGLIPTIN 5 MG PO TABS
5.0000 mg | ORAL_TABLET | Freq: Every day | ORAL | Status: DC
  Administered 2012-12-13 – 2012-12-16 (×4): 5 mg via ORAL
  Filled 2012-12-13 (×5): qty 1

## 2012-12-13 MED ORDER — IRBESARTAN 300 MG PO TABS
300.0000 mg | ORAL_TABLET | Freq: Every day | ORAL | Status: DC
  Administered 2012-12-14 – 2012-12-16 (×3): 300 mg via ORAL
  Filled 2012-12-13 (×3): qty 1

## 2012-12-13 MED ORDER — AMLODIPINE BESYLATE 10 MG PO TABS
10.0000 mg | ORAL_TABLET | Freq: Every day | ORAL | Status: DC
  Administered 2012-12-14 – 2012-12-16 (×3): 10 mg via ORAL
  Filled 2012-12-13 (×3): qty 1

## 2012-12-13 MED ORDER — PHENYLEPHRINE HCL 10 MG/ML IJ SOLN
INTRAMUSCULAR | Status: DC | PRN
  Administered 2012-12-13: 120 ug via INTRAVENOUS
  Administered 2012-12-13 (×2): 40 ug via INTRAVENOUS
  Administered 2012-12-13 (×2): 80 ug via INTRAVENOUS

## 2012-12-13 MED ORDER — ONDANSETRON HCL 4 MG/2ML IJ SOLN
INTRAMUSCULAR | Status: DC | PRN
  Administered 2012-12-13: 4 mg via INTRAVENOUS

## 2012-12-13 MED ORDER — FENTANYL CITRATE 0.05 MG/ML IJ SOLN
INTRAMUSCULAR | Status: DC | PRN
  Administered 2012-12-13 (×2): 50 ug via INTRAVENOUS
  Administered 2012-12-13: 100 ug via INTRAVENOUS
  Administered 2012-12-13: 50 ug via INTRAVENOUS

## 2012-12-13 MED ORDER — ACETAMINOPHEN 325 MG PO TABS: 650.0000 mg | ORAL_TABLET | Freq: Four times a day (QID) | ORAL | Status: DC | PRN

## 2012-12-13 MED ORDER — TRANEXAMIC ACID 100 MG/ML IV SOLN
1000.0000 mg | INTRAVENOUS | Status: AC
  Administered 2012-12-13: 1000 mg via INTRAVENOUS
  Filled 2012-12-13: qty 10

## 2012-12-13 MED ORDER — CHLORHEXIDINE GLUCONATE 4 % EX LIQD: 60.0000 mL | Freq: Once | CUTANEOUS | Status: DC

## 2012-12-13 MED ORDER — LACTATED RINGERS IV SOLN
INTRAVENOUS | Status: DC | PRN
  Administered 2012-12-13 (×2): via INTRAVENOUS

## 2012-12-13 MED ORDER — LIDOCAINE HCL (CARDIAC) 20 MG/ML IV SOLN
INTRAVENOUS | Status: DC | PRN
  Administered 2012-12-13: 80 mg via INTRAVENOUS

## 2012-12-13 MED ORDER — MOMETASONE FURO-FORMOTEROL FUM 100-5 MCG/ACT IN AERO
2.0000 | INHALATION_SPRAY | Freq: Every day | RESPIRATORY_TRACT | Status: DC | PRN
  Filled 2012-12-13: qty 8.8

## 2012-12-13 MED ORDER — EPHEDRINE SULFATE 50 MG/ML IJ SOLN
INTRAMUSCULAR | Status: DC | PRN
  Administered 2012-12-13: 15 mg via INTRAVENOUS
  Administered 2012-12-13: 10 mg via INTRAVENOUS

## 2012-12-13 MED ORDER — INSULIN ASPART 100 UNIT/ML ~~LOC~~ SOLN
0.0000 [IU] | Freq: Three times a day (TID) | SUBCUTANEOUS | Status: DC
  Administered 2012-12-13: 2 [IU] via SUBCUTANEOUS
  Administered 2012-12-13: 3 [IU] via SUBCUTANEOUS
  Administered 2012-12-14 – 2012-12-15 (×3): 2 [IU] via SUBCUTANEOUS

## 2012-12-13 MED ORDER — METOCLOPRAMIDE HCL 5 MG/ML IJ SOLN: 5.0000 mg | Freq: Three times a day (TID) | INTRAMUSCULAR | Status: DC | PRN

## 2012-12-13 MED ORDER — HYDROMORPHONE HCL PF 1 MG/ML IJ SOLN: 0.5000 mg | INTRAMUSCULAR | Status: DC | PRN

## 2012-12-13 MED ORDER — LORATADINE 10 MG PO TABS
10.0000 mg | ORAL_TABLET | Freq: Every day | ORAL | Status: DC
  Administered 2012-12-13 – 2012-12-16 (×4): 10 mg via ORAL
  Filled 2012-12-13 (×5): qty 1

## 2012-12-13 MED ORDER — ASPIRIN EC 325 MG PO TBEC
325.0000 mg | DELAYED_RELEASE_TABLET | Freq: Two times a day (BID) | ORAL | Status: DC
  Administered 2012-12-13 – 2012-12-16 (×7): 325 mg via ORAL
  Filled 2012-12-13 (×9): qty 1

## 2012-12-13 MED ORDER — ACETAMINOPHEN 650 MG RE SUPP: 650.0000 mg | Freq: Four times a day (QID) | RECTAL | Status: DC | PRN

## 2012-12-13 MED ORDER — SUCCINYLCHOLINE CHLORIDE 20 MG/ML IJ SOLN
INTRAMUSCULAR | Status: DC | PRN
  Administered 2012-12-13: 100 mg via INTRAVENOUS

## 2012-12-13 MED ORDER — HYDROMORPHONE HCL PF 1 MG/ML IJ SOLN: 0.2500 mg | INTRAMUSCULAR | Status: DC | PRN

## 2012-12-13 MED ORDER — DOCUSATE SODIUM 100 MG PO CAPS
100.0000 mg | ORAL_CAPSULE | Freq: Two times a day (BID) | ORAL | Status: DC
  Administered 2012-12-13 – 2012-12-16 (×7): 100 mg via ORAL
  Filled 2012-12-13 (×6): qty 1

## 2012-12-13 SURGICAL SUPPLY — 56 items
BLADE SAW SGTL 18X1.27X75 (BLADE) ×2 IMPLANT
BLADE SURG ROTATE 9660 (MISCELLANEOUS) IMPLANT
CAPT HIP PF MOP ×1 IMPLANT
CELLS DAT CNTRL 66122 CELL SVR (MISCELLANEOUS) ×1 IMPLANT
CLOTH BEACON ORANGE TIMEOUT ST (SAFETY) ×2 IMPLANT
COVER BACK TABLE 24X17X13 BIG (DRAPES) IMPLANT
COVER SURGICAL LIGHT HANDLE (MISCELLANEOUS) ×2 IMPLANT
DRAPE C-ARM 42X72 X-RAY (DRAPES) ×2 IMPLANT
DRAPE STERI IOBAN 125X83 (DRAPES) ×2 IMPLANT
DRAPE U-SHAPE 47X51 STRL (DRAPES) ×6 IMPLANT
DRESSING AQUACEL AQ EXTRA 4X5 (GAUZE/BANDAGES/DRESSINGS) ×2 IMPLANT
DRSG AQUACEL AG ADV 3.5X10 (GAUZE/BANDAGES/DRESSINGS) ×2 IMPLANT
DURAPREP 26ML APPLICATOR (WOUND CARE) ×2 IMPLANT
ELECT BLADE 4.0 EZ CLEAN MEGAD (MISCELLANEOUS)
ELECT BLADE TIP CTD 4 INCH (ELECTRODE) ×2 IMPLANT
ELECT CAUTERY BLADE 6.4 (BLADE) ×2 IMPLANT
ELECT REM PT RETURN 9FT ADLT (ELECTROSURGICAL) ×2
ELECTRODE BLDE 4.0 EZ CLN MEGD (MISCELLANEOUS) IMPLANT
ELECTRODE REM PT RTRN 9FT ADLT (ELECTROSURGICAL) ×1 IMPLANT
FACESHIELD LNG OPTICON STERILE (SAFETY) ×4 IMPLANT
GAUZE XEROFORM 1X8 LF (GAUZE/BANDAGES/DRESSINGS) ×2 IMPLANT
GLOVE BIO SURGEON STRL SZ8 (GLOVE) ×2 IMPLANT
GLOVE BIO SURGEON STRL SZ8.5 (GLOVE) ×2 IMPLANT
GLOVE BIOGEL PI IND STRL 7.0 (GLOVE) IMPLANT
GLOVE BIOGEL PI IND STRL 8 (GLOVE) ×1 IMPLANT
GLOVE BIOGEL PI IND STRL 8.5 (GLOVE) ×1 IMPLANT
GLOVE BIOGEL PI INDICATOR 7.0 (GLOVE) ×2
GLOVE BIOGEL PI INDICATOR 8 (GLOVE) ×1
GLOVE BIOGEL PI INDICATOR 8.5 (GLOVE) ×1
GLOVE SURG SS PI 6.5 STRL IVOR (GLOVE) ×2 IMPLANT
GOWN PREVENTION PLUS LG XLONG (DISPOSABLE) IMPLANT
GOWN STRL NON-REIN LRG LVL3 (GOWN DISPOSABLE) ×4 IMPLANT
GOWN STRL REIN XL XLG (GOWN DISPOSABLE) ×2 IMPLANT
KIT BASIN OR (CUSTOM PROCEDURE TRAY) ×2 IMPLANT
KIT ROOM TURNOVER OR (KITS) ×2 IMPLANT
MANIFOLD NEPTUNE II (INSTRUMENTS) ×2 IMPLANT
NS IRRIG 1000ML POUR BTL (IV SOLUTION) ×2 IMPLANT
PACK TOTAL JOINT (CUSTOM PROCEDURE TRAY) ×2 IMPLANT
PAD ARMBOARD 7.5X6 YLW CONV (MISCELLANEOUS) ×4 IMPLANT
RETRACTOR WND ALEXIS 18 MED (MISCELLANEOUS) ×1 IMPLANT
RTRCTR WOUND ALEXIS 18CM MED (MISCELLANEOUS) ×2
SPONGE LAP 18X18 X RAY DECT (DISPOSABLE) ×2 IMPLANT
SPONGE LAP 4X18 X RAY DECT (DISPOSABLE) IMPLANT
STAPLER VISISTAT 35W (STAPLE) ×2 IMPLANT
SUT ETHIBOND NAB CT1 #1 30IN (SUTURE) ×4 IMPLANT
SUT VIC AB 0 CT1 27 (SUTURE)
SUT VIC AB 0 CT1 27XBRD ANBCTR (SUTURE) IMPLANT
SUT VIC AB 1 CT1 27 (SUTURE) ×2
SUT VIC AB 1 CT1 27XBRD ANBCTR (SUTURE) ×1 IMPLANT
SUT VIC AB 2-0 CT1 27 (SUTURE) ×2
SUT VIC AB 2-0 CT1 TAPERPNT 27 (SUTURE) ×1 IMPLANT
SUT VLOC 180 0 24IN GS25 (SUTURE) ×2 IMPLANT
TOWEL OR 17X24 6PK STRL BLUE (TOWEL DISPOSABLE) ×2 IMPLANT
TOWEL OR 17X26 10 PK STRL BLUE (TOWEL DISPOSABLE) ×4 IMPLANT
TRAY FOLEY CATH 14FR (SET/KITS/TRAYS/PACK) ×1 IMPLANT
WATER STERILE IRR 1000ML POUR (IV SOLUTION) ×4 IMPLANT

## 2012-12-13 NOTE — Interval H&P Note (Signed)
History and Physical Interval Note:  12/13/2012 7:33 AM  Lynn Carey  has presented today for surgery, with the diagnosis of DEGENERATIVE JOINT DISEASE LEFT HIP  The various methods of treatment have been discussed with the patient and family. After consideration of risks, benefits and other options for treatment, the patient has consented to  Procedure(s): TOTAL HIP ARTHROPLASTY ANTERIOR APPROACH (Left) as a surgical intervention .  The patient's history has been reviewed, patient examined, no change in status, stable for surgery.  I have reviewed the patient's chart and labs.  Questions were answered to the patient's satisfaction.     Karlyn Glasco G

## 2012-12-13 NOTE — Evaluation (Signed)
Physical Therapy Evaluation Patient Details Name: Lynn Carey MRN: [id number] DOB: [birth year] Today's Date: 12/13/2012 Time: [id number] PT Time Calculation (min): 20 min  PT Assessment / Plan / Recommendation History of Present Illness  Pt admitted for elective L anteior approach THR  Clinical Impression  Pt is s/p THA resulting in the deficits listed below (see PT Problem List). Pt tolerated OOB mobility well for first day.  Pt will benefit from skilled PT to increase their independence and safety with mobility to allow discharge to the venue listed below. Pt with limited support at home, therefore would benefit from ST-SNF to achieve mod I function for safe transition home.     PT Assessment  Patient needs continued PT services    Follow Up Recommendations  SNF;Supervision/Assistance - 24 hour    Does the patient have the potential to tolerate intense rehabilitation      Barriers to Discharge Decreased caregiver support      Equipment Recommendations  Rolling walker with 5" wheels    Recommendations for Other Services     Frequency 7X/week    Precautions / Restrictions Precautions Precautions: None Restrictions Weight Bearing Restrictions: Yes RLE Weight Bearing: Weight bearing as tolerated   Pertinent Vitals/Pain 2/10 L hip pain      Mobility  Bed Mobility Bed Mobility: Supine to Sit;Sitting - Scoot to Edge of Bed Supine to Sit: 4: Min assist;HOB elevated Sitting - Scoot to [employer] of Bed: 4: Min assist Details for Bed Mobility Assistance: assist for L LE management, v/c's for technique Transfers Transfers: Sit to Stand;Stand to Sit Sit to Stand: 4: Min guard;With upper extremity assist;With armrests Stand to Sit: 4: Min guard;With upper extremity assist;To chair/3-in-1 Details for Transfer Assistance: v/c's for safe hand placement and L LE management Ambulation/Gait Ambulation/Gait Assistance: 4: Min guard;4: Min assist Ambulation Distance (Feet): 20  Feet Assistive device: Rolling walker Ambulation/Gait Assistance Details: v/c's for sequencing, limited L LE WBing Gait Pattern: Step-to pattern;Decreased step length - left;Decreased stance time - left Gait velocity: slow Stairs: No    Exercises Total Joint Exercises Ankle Circles/Pumps: AROM;Both;10 reps Quad Sets: AROM;Left;10 reps Knee Flexion: AROM;Left;10 reps;Supine   PT Diagnosis: Difficulty walking;Acute pain  PT Problem List: Decreased strength;Decreased range of motion;Decreased activity tolerance;Decreased balance;Decreased mobility PT Treatment Interventions: DME instruction;Gait training;Stair training;Functional mobility training;Therapeutic activities;Therapeutic exercise     PT Goals(Current goals can be found in the care plan section) Acute Rehab PT Goals Patient Stated Goal: rehab then home PT Goal Formulation: With patient Time For Goal Achievement: 12/27/12 Potential to Achieve Goals: Good  Visit Information  Last PT Received On: 12/13/12 Assistance Needed: +1 History of Present Illness: Pt admitted for elective L anteior approach THR       Prior [place] expects to be discharged to:: Skilled nursing facility Additional Comments: pt reports "I'm going to [facility]" Prior Function Level of Independence: Independent Communication Communication: No difficulties Dominant Hand: Right    Cognition  Cognition Arousal/Alertness: Awake/alert Behavior During Therapy: WFL for tasks assessed/performed Overall Cognitive Status: Within Functional Limits for tasks assessed    Extremity/Trunk Assessment Upper Extremity Assessment Upper Extremity Assessment: Overall WFL for tasks assessed Lower Extremity Assessment Lower Extremity Assessment: LLE deficits/detail LLE Deficits / Details: limited hip/knee flexion due to pain, approx 30 deg Cervical / Trunk Assessment Cervical / Trunk Assessment: Normal   Balance    End of  Session PT - End of Session Equipment Utilized During Treatment: Gait belt Activity Tolerance: Patient  tolerated treatment well Patient left: in chair;with call bell/phone within reach;with family/visitor present Nurse Communication: Mobility status  GP     Kingsley Callander 12/13/2012, 4:16 PM  Kittie Plater, PT, DPT Pager #: [phone] Office #: [phone]

## 2012-12-13 NOTE — Anesthesia Preprocedure Evaluation (Addendum)
Anesthesia Evaluation  Patient identified by MRN, date of birth, ID band Patient awake    Reviewed: Allergy & Precautions, H&P , NPO status , Patient's Chart, lab work & pertinent test results, reviewed documented beta blocker date and time   Airway Mallampati: I TM Distance: >3 FB Neck ROM: Full    Dental  (+) Edentulous Upper, Partial Lower and Dental Advisory Given   Pulmonary  breath sounds clear to auscultation        Cardiovascular hypertension, Pt. on medications and Pt. on home beta blockers + angina with exertion + CAD and + Past MI Rhythm:Regular Rate:Normal     Neuro/Psych    GI/Hepatic   Endo/Other  diabetes  Renal/GU      Musculoskeletal   Abdominal   Peds  Hematology   Anesthesia Other Findings   Reproductive/Obstetrics                           Anesthesia Physical Anesthesia Plan  ASA: III  Anesthesia Plan: General   Post-op Pain Management:    Induction: Intravenous  Airway Management Planned: Oral ETT  Additional Equipment:   Intra-op Plan:   Post-operative Plan: Extubation in OR  Informed Consent: I have reviewed the patients History and Physical, chart, labs and discussed the procedure including the risks, benefits and alternatives for the proposed anesthesia with the patient or authorized representative who has indicated his/her understanding and acceptance.   Dental advisory given  Plan Discussed with: CRNA, Anesthesiologist and Surgeon  Anesthesia Plan Comments:         Anesthesia Quick Evaluation

## 2012-12-13 NOTE — Anesthesia Postprocedure Evaluation (Signed)
  Anesthesia Post-op Note  Patient: Lynn Carey  Procedure(s) Performed: Procedure(s): TOTAL HIP ARTHROPLASTY ANTERIOR APPROACH (Left)  Patient Location: PACU  Anesthesia Type:General  Level of Consciousness: awake, alert  and oriented  Airway and Oxygen Therapy: Patient Spontanous Breathing and Patient connected to face mask oxygen  Post-op Pain: mild  Post-op Assessment: Post-op Vital signs reviewed  Post-op Vital Signs: Reviewed  Complications: No apparent anesthesia complications

## 2012-12-13 NOTE — Progress Notes (Signed)
UR COMPLETED  

## 2012-12-13 NOTE — Preoperative (Signed)
Beta Blockers   Reason not to administer Beta Blockers:Not Applicable 

## 2012-12-13 NOTE — Transfer of Care (Signed)
Immediate Anesthesia Transfer of Care Note  Patient: Lynn Carey  Procedure(s) Performed: Procedure(s): TOTAL HIP ARTHROPLASTY ANTERIOR APPROACH (Left)  Patient Location: PACU  Anesthesia Type:General  Level of Consciousness: awake, alert  and patient cooperative  Airway & Oxygen Therapy: Patient Spontanous Breathing and Patient connected to nasal cannula oxygen  Post-op Assessment: Report given to PACU RN, Post -op Vital signs reviewed and stable and Patient moving all extremities  Post vital signs: Reviewed and stable  Complications: No apparent anesthesia complications

## 2012-12-13 NOTE — Anesthesia Procedure Notes (Signed)
Procedure Name: Intubation Date/Time: 12/13/2012 7:46 AM Performed by: Trixie Deis A Pre-anesthesia Checklist: Patient identified, Emergency Drugs available, Suction available, Patient being monitored and Timeout performed Patient Re-evaluated:Patient Re-evaluated prior to inductionOxygen Delivery Method: Circle system utilized Preoxygenation: Pre-oxygenation with 100% oxygen Intubation Type: IV induction Ventilation: Mask ventilation without difficulty and Oral airway inserted - appropriate to patient size Laryngoscope Size: Mac and 3 Grade View: Grade I Tube type: Oral Tube size: 7.0 mm Number of attempts: 1 Airway Equipment and Method: Stylet Placement Confirmation: ETT inserted through vocal cords under direct vision and breath sounds checked- equal and bilateral Secured at: 20 (gum) cm Tube secured with: Tape Dental Injury: Teeth and Oropharynx as per pre-operative assessment

## 2012-12-13 NOTE — Op Note (Signed)
PRE-OP DIAGNOSIS:  LEFT HIP DEGENERATIVE JOINT DISEASE POST-OP DIAGNOSIS: same PROCEDURE:  LEFT TOTAL HIP ARTHROPLASTY ANTERIOR APPROACH ANESTHESIA:  General SURGEON:  Melrose Nakayama MD ASSISTANT:  Roselee Nova PA-C and April Green RNFA   INDICATIONS FOR PROCEDURE:  The patient is a 72 y.o. female with a long history of a painful hip.  This has persisted despite multiple conservative measures.  The patient has persisted with pain and dysfunction making rest and activity difficult.  A total hip replacement is offered as surgical treatment.  Informed operative consent was obtained after discussion of possible complications including reaction to anesthesia, infection, neurovascular injury, dislocation, DVT, PE, and death.  The importance of the postoperative rehab program to optimize result was stressed with the patient.  SUMMARY OF FINDINGS AND PROCEDURE:  Under general anesthesia through a anterior approach an the Hana table a right THR was performed.  The patient had severe degenerative change and fair bone quality.  We used DePuy components to replace the hip and these were size KA10 Corail femur capped with a +1 32 mm hip ball.  On the acetabular side we used a size 48 Gription shell with a plus 4 neutral polyethylene liner.  We did use a hole eliminator.  Chauncey Cruel assisted throughout and was invaluable to the completion of the case in that he helped position and retract while I performed the procedure.  He also closed simultaneously to help minimize OR time.  I used fluoroscopy throughout the case to check position of components and leg lengths and read all these views myself.  DESCRIPTION OF PROCEDURE:  The patient was taken to the OR suite where general anesthetic was applied.  The patient was then positioned on the Hana table supine.  All bony prominences were appropriately padded.  Prep and drape was then performed in normal sterile fashion.  The patient was given Kefzol preoperative  antibiotic and an appropriate time out was performed.  We then took an anterior approach to the right hip.  Dissection was taken through adipose to the tensor fascia lata fascia.  This structure was incised longitudinally and we dissected in the intermuscular interval just medial to this muscle.  Cobra retractors were placed superior and inferior to the femoral neck superficial to the capsule.  A capsular incision was then made and the retractors were placed along the femoral neck.  Xray was brought in to get a good level for the femoral neck cut which was made with an oscillating saw and osteotome.  The femoral head was removed with a corkscrew.  The acetabulum was exposed and some labral tissues were excised. Reaming was taken to the inside wall of the pelvis and sequentially up to 1 mm smaller than the actual component.  A trial of components was done and then the aforementioned acetabular shell was placed in appropriate tilt and anteversion confirmed by fluoroscopy. The liner was placed along with the hole eliminator and attention was turned to the femur.  The leg was brought down and over into adduction and the elevator bar was used to raise the femur up gently in the wound.  The piriformis was released with care taken to preserve the obturator internus attachment and all of the posterior capsule. The femur was reamed and then broached to the appropriate size.  A trial reduction was done and the aforementioned head and neck assembly gave [place] the best stability in extension with external rotation.  Leg lengths were felt to be about equal by fluoroscopic exam.  The trial components were removed and the wound irrigated.  We then placed the femoral component in appropriate anteversion.  The head was applied to a dry stem neck and the hip again reduced.  It was again stable in the aforementioned position.  The would was irrigated again followed by re-approximation of anterior capsule with ethibond suture. Tensor  fascia was repaired with V-loc suture  followed by subcutaneous closure with #O and #2 undyed vicryl.  Skin was closed with staples followed by a sterile dressing.  EBL and IOF can be obtained from anesthesia records.  DISPOSITION:  The patient was extubated in the OR and taken to PACU in stable condition to be admitted to the Orthopedic Surgery for appropriate post-op care to include perioperative antibiotics and DVT prophylaxis.

## 2012-12-13 NOTE — Progress Notes (Signed)
Care of pt assumed at this time.  Assessment completed, agree with previously charted assessment.  Pt and daughter deny any questions or concerns at this time.  Will monitor.  Coolidge Breeze, RN 12/13/2012

## 2012-12-14 LAB — CBC
HCT: 26.7 % — ABNORMAL LOW (ref 36.0–46.0)
Hemoglobin: 8.8 g/dL — ABNORMAL LOW (ref 12.0–15.0)
MCH: 28.2 pg (ref 26.0–34.0)
MCHC: 33 g/dL (ref 30.0–36.0)
MCV: 85.6 fL (ref 78.0–100.0)
Platelets: 298 10*3/uL (ref 150–400)
RBC: 3.12 MIL/uL — ABNORMAL LOW (ref 3.87–5.11)
RDW: 14 % (ref 11.5–15.5)
WBC: 9.2 10*3/uL (ref 4.0–10.5)

## 2012-12-14 LAB — BASIC METABOLIC PANEL
BUN: 14 mg/dL (ref 6–23)
CO2: 27 mEq/L (ref 19–32)
Calcium: 8.9 mg/dL (ref 8.4–10.5)
Chloride: 101 mEq/L (ref 96–112)
Creatinine, Ser: 0.88 mg/dL (ref 0.50–1.10)
GFR calc Af Amer: 74 mL/min — ABNORMAL LOW (ref >90)
GFR calc non Af Amer: 64 mL/min — ABNORMAL LOW (ref >90)
Glucose, Bld: 120 mg/dL — ABNORMAL HIGH (ref 70–99)
Potassium: 4.6 mEq/L (ref 3.5–5.1)
Sodium: 136 mEq/L (ref 135–145)

## 2012-12-14 LAB — GLUCOSE, CAPILLARY
Glucose-Capillary: 132 mg/dL — ABNORMAL HIGH (ref 70–99)
Glucose-Capillary: 141 mg/dL — ABNORMAL HIGH (ref 70–99)
Glucose-Capillary: 99 mg/dL (ref 70–99)
Glucose-Capillary: 132 mg/dL — ABNORMAL HIGH (ref 70–99)

## 2012-12-14 NOTE — Evaluation (Signed)
Occupational Therapy Evaluation Patient Details Name: Lynn Carey MRN: [id number] DOB: 1940-08-09 Today's Date: 12/14/2012 Time: [id number] OT Time Calculation (min): 21 min  OT Assessment / Plan / Recommendation History of present illness Pt admitted for elective L anteior approach THR   Clinical Impression   Pt demos decline in function with ADLs and ADL mobility safety and would benefit from acute OT services to address impairments to increase level of function and safety    OT Assessment  Patient needs continued OT Services    Follow Up Recommendations  SNF;Supervision/Assistance - 24 hour (TBD at SNF level of care)    Barriers to Discharge Decreased caregiver support Pt plans to d/c to SNF for short term rehab  Equipment Recommendations  None recommended by OT;Other (comment)    Recommendations for Other Services    Frequency  Min 2X/week    Precautions / Restrictions Precautions Precautions: None Restrictions RLE Weight Bearing: Weight bearing as tolerated   Pertinent Vitals/Pain     ADL  Grooming: Performed;Wash/dry hands;Wash/dry face;Min guard Where Assessed - Grooming: Supported standing Upper Body Bathing: Simulated;Supervision/safety;Set up Lower Body Bathing: Simulated;Moderate assistance Upper Body Dressing: Performed;Supervision/safety;Set up Lower Body Dressing: Performed;Moderate assistance Toilet Transfer: Performed;Min guard Armed forces technical officer Method: Sit to Loss adjuster, chartered: Therapist, music and Hygiene: Performed;Minimal assistance Where Assessed - Best boy and Hygiene: Standing Tub/Shower Transfer Method: Not assessed Equipment Used: Gait belt;Rolling walker;Sock aid;Reacher;Long-handled sponge;Long-handled shoe horn Transfers/Ambulation Related to ADLs: cues for safety, correct hand placement ADL Comments: Pt provided with education and demo of ADL A/E    OT Diagnosis:    OT  Problem List: Decreased strength;Decreased knowledge of use of DME or AE;Decreased activity tolerance;Impaired balance (sitting and/or standing);Pain OT Treatment Interventions: Self-care/ADL training;Therapeutic exercise;Balance training;Therapeutic activities;Patient/family education;Neuromuscular education;DME and/or AE instruction   OT Goals(Current goals can be found in the care plan section) Acute Rehab OT Goals Patient Stated Goal: rehab then home OT Goal Formulation: With patient Time For Goal Achievement: 12/21/12 Potential to Achieve Goals: Good ADL Goals Pt Will Perform Grooming: with supervision;with set-up;standing Pt Will Perform Lower Body Bathing: with min assist;with caregiver independent in assisting Pt Will Perform Lower Body Dressing: with min assist;with caregiver independent in assisting Pt Will Transfer to Toilet: with supervision;grab bars Pt Will Perform Toileting - Clothing Manipulation and hygiene: with min guard assist;with caregiver independent in assisting  Visit Information  Last OT Received On: 12/14/12 Assistance Needed: +1 History of Present Illness: Pt admitted for elective L anteior approach THR       Prior [place] expects to be discharged to:: Skilled nursing facility Living Arrangements: Children Additional Comments: pt reports "I'm going to [facility]" Prior Function Level of Independence: Independent Communication Communication: No difficulties Dominant Hand: Left         Vision/Perception Vision - History Baseline Vision: Wears glasses all the time Patient Visual Report: No change from baseline Perception Perception: Within Functional Limits   Cognition  Cognition Arousal/Alertness: Awake/alert Behavior During Therapy: WFL for tasks assessed/performed Overall Cognitive Status: Within Functional Limits for tasks assessed    Extremity/Trunk Assessment Upper Extremity Assessment Upper  Extremity Assessment: Overall WFL for tasks assessed Cervical / Trunk Assessment Cervical / Trunk Assessment: Normal     Mobility Bed Mobility Bed Mobility: Not assessed Details for Bed Mobility Assistance: Patient up in recliner upon entering room Transfers Sit to Stand: 4: Min guard;With upper extremity assist;With armrests;From chair/3-in-1 Stand to Sit:  4: Min guard;With upper extremity assist;To chair/3-in-1 Details for Transfer Assistance: v/c's for safe hand placement and L LE management     Exercise Total Joint Exercises Quad Sets: AROM;Left;10 reps Short Arc QuadSinclair Ship;Left;10 reps Heel Slides: AAROM;Left;10 reps Hip ABduction/ADduction: AAROM;Left;10 reps   Balance Balance Balance Assessed: Yes Dynamic Standing Balance Dynamic Standing - Balance Support: Right upper extremity supported;During functional activity Dynamic Standing - Level of Assistance: 5: Stand by assistance   End of Session OT - End of Session Equipment Utilized During Treatment: Rolling walker;Gait belt;Other (comment) (ADL A/E) Activity Tolerance: Patient tolerated treatment well Patient left: in chair;with call bell/phone within reach;with family/visitor present  GO     Britt Bottom 12/14/2012, 12:13 PM

## 2012-12-14 NOTE — Progress Notes (Signed)
Physical Therapy Treatment Patient Details Name: Lynn Carey MRN: [id number] DOB: 10-19-1940 Today's Date: 12/14/2012 Time: [id number] PT Time Calculation (min): 23 min  PT Assessment / Plan / Recommendation  History of Present Illness Pt admitted for elective L anteior approach THR   PT Comments   Patient progressing with ambulation and addition of therex. Patient having some increase L knee pain and swelling. Continue to recommend SNF for ongoing Physical Therapy.     Follow Up Recommendations  SNF;Supervision/Assistance - 24 hour     Does the patient have the potential to tolerate intense rehabilitation     Barriers to Discharge        Equipment Recommendations  Rolling walker with 5" wheels    Recommendations for Other Services    Frequency 7X/week   Progress towards PT Goals Progress towards PT goals: Progressing toward goals  Plan Current plan remains appropriate    Precautions / Restrictions Precautions Precautions: None Restrictions RLE Weight Bearing: Weight bearing as tolerated   Pertinent Vitals/Pain Complaints of knee pain with therex. 3/10 L hip pain. patient repositioned for comfort     Mobility  Bed Mobility Bed Mobility: Not assessed Details for Bed Mobility Assistance: Patient up in recliner upon entering room Transfers Sit to Stand: 4: Min guard;With upper extremity assist;With armrests;From chair/3-in-1 Stand to Sit: 4: Min guard;With upper extremity assist;To chair/3-in-1 Details for Transfer Assistance: v/c's for safe hand placement and L LE management Ambulation/Gait Ambulation/Gait Assistance: 4: Min guard Ambulation Distance (Feet): 50 Feet Assistive device: Rolling walker Ambulation/Gait Assistance Details: Cues for gait sequence and RW positioning. Cues for upright posture  Gait Pattern: Step-to pattern;Decreased step length - left;Decreased stance time - left Gait velocity: slow    Exercises Total Joint Exercises Quad Sets:  AROM;Left;10 reps Short Arc QuadSinclair Ship;Left;10 reps Heel Slides: AAROM;Left;10 reps Hip ABduction/ADduction: AAROM;Left;10 reps   PT Diagnosis:    PT Problem List:   PT Treatment Interventions:     PT Goals (current goals can now be found in the care plan section)    Visit Information  Last PT Received On: 12/14/12 Assistance Needed: +1 History of Present Illness: Pt admitted for elective L anteior approach THR    Subjective Data      Cognition  Cognition Arousal/Alertness: Awake/alert Behavior During Therapy: WFL for tasks assessed/performed Overall Cognitive Status: Within Functional Limits for tasks assessed    Balance     End of Session PT - End of Session Activity Tolerance: Patient tolerated treatment well Patient left: in chair;with call bell/phone within reach;with family/visitor present Nurse Communication: Mobility status   GP     Jacqualyn Posey 12/14/2012, 8:51 AM 12/14/2012 Jacqualyn Posey PTA [phone] pager [phone] office

## 2012-12-14 NOTE — Progress Notes (Signed)
Subjective: 1 Day Post-Op Procedure(s) (LRB): TOTAL HIP ARTHROPLASTY ANTERIOR APPROACH (Left) Hemoglobin 8.8 and no symptoms of blood loss anemia. Activity level:  Weightbearing as tolerated Diet tolerance:  ok Voiding:  ok Patient reports pain as 3 on 0-10 scale.    Objective: Vital signs in last 24 hours: Temp:  [97.3 F (36.3 C)-100.1 F (37.8 C)] 98.5 F (36.9 C) (09/10 0627) Pulse Rate:  [55-82] 69 (09/10 0627) Resp:  [11-21] 16 (09/10 0627) BP: (102-118)/(48-64) 113/55 mmHg (09/10 0627) SpO2:  [92 %-100 %] 100 % (09/10 0627)  Labs:  Recent Labs  12/14/12 0518  HGB 8.8*    Recent Labs  12/14/12 0518  WBC 9.2  RBC 3.12*  HCT 26.7*  PLT 298    Recent Labs  12/14/12 0518  NA 136  K 4.6  CL 101  CO2 27  BUN 14  CREATININE 0.88  GLUCOSE 120*  CALCIUM 8.9   No results found for this basename: LABPT, INR,  in the last 72 hours  Physical Exam:  Neurologically intact ABD soft Neurovascular intact Sensation intact distally Intact pulses distally Dorsiflexion/Plantar flexion intact Incision: dressing C/D/I No cellulitis present Compartment soft  Assessment/Plan:  1 Day Post-Op Procedure(s) (LRB): TOTAL HIP ARTHROPLASTY ANTERIOR APPROACH (Left) Advance diet Up with therapy D/C IV fluids Discharge to SNF most likely Friday. Continue ASA/SCDs for DVT prophylaxis. We'll continue to monitor hemoglobin and if becomes symptomatic would transfuse.    Lynn Carey R 12/14/2012, 8:13 AM

## 2012-12-15 ENCOUNTER — Encounter (HOSPITAL_COMMUNITY): Payer: Self-pay | Admitting: Emergency Medicine

## 2012-12-15 LAB — GLUCOSE, CAPILLARY
Glucose-Capillary: 107 mg/dL — ABNORMAL HIGH (ref 70–99)
Glucose-Capillary: 115 mg/dL — ABNORMAL HIGH (ref 70–99)
Glucose-Capillary: 126 mg/dL — ABNORMAL HIGH (ref 70–99)
Glucose-Capillary: 112 mg/dL — ABNORMAL HIGH (ref 70–99)

## 2012-12-15 LAB — CBC
HCT: 27.2 % — ABNORMAL LOW (ref 36.0–46.0)
Hemoglobin: 8.9 g/dL — ABNORMAL LOW (ref 12.0–15.0)
MCH: 28.1 pg (ref 26.0–34.0)
MCHC: 32.7 g/dL (ref 30.0–36.0)
MCV: 85.8 fL (ref 78.0–100.0)
Platelets: 281 10*3/uL (ref 150–400)
RBC: 3.17 MIL/uL — ABNORMAL LOW (ref 3.87–5.11)
RDW: 14 % (ref 11.5–15.5)
WBC: 10.1 10*3/uL (ref 4.0–10.5)

## 2012-12-15 MED ORDER — FERROUS SULFATE 325 (65 FE) MG PO TABS: 325.0000 mg | ORAL_TABLET | Freq: Every day | ORAL | Status: DC

## 2012-12-15 MED ORDER — PNEUMOCOCCAL VAC POLYVALENT 25 MCG/0.5ML IJ INJ
0.5000 mL | INJECTION | INTRAMUSCULAR | Status: AC
  Administered 2012-12-16: 0.5 mL via INTRAMUSCULAR
  Filled 2012-12-15: qty 0.5

## 2012-12-15 MED ORDER — METHOCARBAMOL 500 MG PO TABS: 500.0000 mg | ORAL_TABLET | Freq: Four times a day (QID) | ORAL | Status: DC | PRN

## 2012-12-15 MED ORDER — HYDROCODONE-ACETAMINOPHEN 5-325 MG PO TABS: 1.0000 | ORAL_TABLET | ORAL | Status: DC | PRN

## 2012-12-15 MED ORDER — INFLUENZA VAC SPLIT QUAD 0.5 ML IM SUSP
0.5000 mL | INTRAMUSCULAR | Status: AC
  Administered 2012-12-16: 0.5 mL via INTRAMUSCULAR
  Filled 2012-12-15: qty 0.5

## 2012-12-15 MED ORDER — ASPIRIN 325 MG PO TBEC: 325.0000 mg | DELAYED_RELEASE_TABLET | Freq: Two times a day (BID) | ORAL | Status: DC

## 2012-12-15 NOTE — Progress Notes (Signed)
Subjective: 2 Days Post-Op Procedure(s) (LRB): TOTAL HIP ARTHROPLASTY ANTERIOR APPROACH (Left) Patient working well with therapy. No complaints of dizziness or other issues. Activity level:  Weightbearing as tolerated left hip Diet tolerance:  ok Voiding:  ok Patient reports pain as 3 on 0-10 scale.    Objective: Vital signs in last 24 hours: Temp:  [97.3 F (36.3 C)-98.7 F (37.1 C)] 98.2 F (36.8 C) (09/11 0610) Pulse Rate:  [76-81] 81 (09/11 0610) Resp:  [16] 16 (09/11 0610) BP: (96-111)/(49-56) 111/56 mmHg (09/11 0610) SpO2:  [97 %-99 %] 97 % (09/11 0610)  Labs:  Recent Labs  12/14/12 0518 12/15/12 0530  HGB 8.8* 8.9*    Recent Labs  12/14/12 0518 12/15/12 0530  WBC 9.2 10.1  RBC 3.12* 3.17*  HCT 26.7* 27.2*  PLT 298 281    Recent Labs  12/14/12 0518  NA 136  K 4.6  CL 101  CO2 27  BUN 14  CREATININE 0.88  GLUCOSE 120*  CALCIUM 8.9   No results found for this basename: LABPT, INR,  in the last 72 hours  Physical Exam:  Neurologically intact ABD soft Neurovascular intact Sensation intact distally Intact pulses distally Dorsiflexion/Plantar flexion intact Incision: dressing C/D/I and no drainage No cellulitis present Compartment soft  Assessment/Plan:  2 Days Post-Op Procedure(s) (LRB): TOTAL HIP ARTHROPLASTY ANTERIOR APPROACH (Left) Advance diet Up with therapy Discharge to SNF tomorrow to [facility] place. Prescriptions on the chart. Continue ASA 325 1 pill twice a day x4 weeks. Return to office in 2 weeks for staple removal. Continue ferrous sulfate 325 one per day    Lynn Carey R 12/15/2012, 1:14 PM
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
## 2012-12-15 NOTE — Progress Notes (Signed)
Per telephone with Ronalee Belts PA waiting on CBC to result and if hgb below 8.0 will tranfuse. CBC resulted, Hgb 8.9. Will not tranfuse at this time. Will notify Ronalee Belts.

## 2012-12-15 NOTE — Progress Notes (Signed)
Physical Therapy Treatment Patient Details Name: Lynn Carey MRN: [id number] DOB: 1940-07-12 Today's Date: 12/15/2012 Time: [id number] PT Time Calculation (min): 25 min  PT Assessment / Plan / Recommendation  History of Present Illness Pt admitted for elective L anteior approach THR   PT Comments   Patient HGB being watched and she stated that she feels extremely tired but has no complaints of dizziness or lightheadedness. Patient motivated to progress and ambulate, just wants to take several breaks. Continue to recommend SNF for ongoing Physical Therapy.     Follow Up Recommendations  SNF;Supervision/Assistance - 24 hour     Does the patient have the potential to tolerate intense rehabilitation     Barriers to Discharge        Equipment Recommendations  Rolling walker with 5" wheels    Recommendations for Other Services    Frequency 7X/week   Progress towards PT Goals Progress towards PT goals: Progressing toward goals  Plan Current plan remains appropriate    Precautions / Restrictions Precautions Precautions: None Restrictions Weight Bearing Restrictions: Yes RLE Weight Bearing: Weight bearing as tolerated   Pertinent Vitals/Pain Stated she had no pain   Mobility  Bed Mobility Bed Mobility: Not assessed Transfers Sit to Stand: 4: Min guard;With upper extremity assist;With armrests;From chair/3-in-1 Stand to Sit: 4: Min guard;With upper extremity assist;To chair/3-in-1 Details for Transfer Assistance: v/c's for safe hand placement  Ambulation/Gait Ambulation/Gait Assistance: 4: Min guard Ambulation Distance (Feet): 80 Feet Assistive device: Rolling walker Ambulation/Gait Assistance Details: Cues for RW positioning and sequnce Gait Pattern: Step-to pattern;Decreased step length - left;Decreased stance time - left Gait velocity: slow    Exercises Total Joint Exercises Quad Sets: AROM;Left;10 reps Short Arc QuadSinclair Ship;Left;10 reps Heel Slides: AAROM;Left;10  reps Hip ABduction/ADduction: AAROM;Left;10 reps   PT Diagnosis:    PT Problem List:   PT Treatment Interventions:     PT Goals (current goals can now be found in the care plan section)    Visit Information  Last PT Received On: 12/15/12 Assistance Needed: +1 History of Present Illness: Pt admitted for elective L anteior approach THR    Subjective Data      Cognition  Cognition Arousal/Alertness: Awake/alert Behavior During Therapy: WFL for tasks assessed/performed Overall Cognitive Status: Within Functional Limits for tasks assessed    Balance     End of Session PT - End of Session Equipment Utilized During Treatment: Gait belt Activity Tolerance: Patient tolerated treatment well Patient left: in chair;with call bell/phone within reach;with family/visitor present Nurse Communication: Mobility status   GP     Lynn Carey 12/15/2012, 11:53 AM  12/15/2012 Lynn Carey PTA [phone] pager [phone] office

## 2012-12-16 ENCOUNTER — Encounter (HOSPITAL_COMMUNITY): Payer: Self-pay | Admitting: Orthopaedic Surgery

## 2012-12-16 LAB — CBC
HCT: 24.9 % — ABNORMAL LOW (ref 36.0–46.0)
Hemoglobin: 8.3 g/dL — ABNORMAL LOW (ref 12.0–15.0)
MCH: 28.3 pg (ref 26.0–34.0)
MCHC: 33.3 g/dL (ref 30.0–36.0)
MCV: 85 fL (ref 78.0–100.0)
Platelets: 264 10*3/uL (ref 150–400)
RBC: 2.93 MIL/uL — ABNORMAL LOW (ref 3.87–5.11)
RDW: 13.8 % (ref 11.5–15.5)
WBC: 10.3 10*3/uL (ref 4.0–10.5)

## 2012-12-16 LAB — GLUCOSE, CAPILLARY
Glucose-Capillary: 111 mg/dL — ABNORMAL HIGH (ref 70–99)
Glucose-Capillary: 81 mg/dL (ref 70–99)

## 2012-12-16 NOTE — Progress Notes (Signed)
Subjective: 3 Days Post-Op Procedure(s) (LRB): TOTAL HIP ARTHROPLASTY ANTERIOR APPROACH (Left) Hemoglobin stable no symptoms of dizziness. Ready to go to a skilled nursing today. Activity level:  Left hip weightbearing as tolerated Diet tolerance:  ok Voiding:  ok Patient reports pain as 2 on 0-10 scale.    Objective: Vital signs in last 24 hours: Temp:  [98.4 F (36.9 C)-99.1 F (37.3 C)] 98.8 F (37.1 C) (09/12 0626) Pulse Rate:  [70-77] 70 (09/12 0626) Resp:  [16-18] 18 (09/12 0754) BP: (108-120)/(56-76) 120/76 mmHg (09/12 0626) SpO2:  [96 %-98 %] 97 % (09/12 0754)  Labs:  Recent Labs  12/14/12 0518 12/15/12 0530 12/16/12 0542  HGB 8.8* 8.9* 8.3*    Recent Labs  12/15/12 0530 12/16/12 0542  WBC 10.1 10.3  RBC 3.17* 2.93*  HCT 27.2* 24.9*  PLT 281 264    Recent Labs  12/14/12 0518  NA 136  K 4.6  CL 101  CO2 27  BUN 14  CREATININE 0.88  GLUCOSE 120*  CALCIUM 8.9   No results found for this basename: LABPT, INR,  in the last 72 hours  Physical Exam:  Neurologically intact ABD soft Neurovascular intact Sensation intact distally Intact pulses distally Dorsiflexion/Plantar flexion intact No cellulitis present Compartment soft  Assessment/Plan:  3 Days Post-Op Procedure(s) (LRB): TOTAL HIP ARTHROPLASTY ANTERIOR APPROACH (Left) Advance diet Up with therapy Discharge to SNF to [facility] placed today. We'll continue ASA 325 1 pill twice a day x4 weeks. FL2 is sign and prescriptions on the chart. Return to office in 2 weeks. Discharge summary are in chart.    Lynn Carey R 12/16/2012, 7:58 AM

## 2012-12-16 NOTE — Progress Notes (Signed)
Patient is being discharged to [facility], skilled nursing facility via EMT.  Patient's IV has been removed and discharge instructions will be sent via EMT to facility. Report has been given to nurse Quillian Quince at facility.  Ruben Im RN

## 2012-12-16 NOTE — Progress Notes (Signed)
The patient has been transported to [facility] via EMS. Ruben Im RN

## 2012-12-16 NOTE — Progress Notes (Signed)
Physical Therapy Treatment Patient Details Name: Lynn Carey MRN: [id number] DOB: 01-29-1941 Today's Date: 12/16/2012 Time: [id number] PT Time Calculation (min): 25 min  PT Assessment / Plan / Recommendation  History of Present Illness Pt admitted for elective L anteior approach THR   PT Comments   Pt progressing well with mobility. Continues to fatigue quickly. No c/o dizziness this session. Plans to D/C to [facility] today prior to returning home.   Follow Up Recommendations  SNF;Supervision/Assistance - 24 hour     Does the patient have the potential to tolerate intense rehabilitation     Barriers to Discharge        Equipment Recommendations  Rolling walker with 5" wheels    Recommendations for Other Services    Frequency 7X/week   Progress towards PT Goals Progress towards PT goals: Progressing toward goals  Plan Current plan remains appropriate    Precautions / Restrictions Precautions Precautions: None Restrictions Weight Bearing Restrictions: Yes RLE Weight Bearing: Weight bearing as tolerated   Pertinent Vitals/Pain "i dont really have much pain. Just soreness"    Mobility  Bed Mobility Bed Mobility: Not assessed Transfers Transfers: Sit to Stand;Stand to Sit Sit to Stand: 5: Supervision;From chair/3-in-1;From toilet;With armrests Stand to Sit: 5: Supervision;To toilet;To chair/3-in-1;With armrests Details for Transfer Assistance: supervision for min cues for hand placement and safety  Ambulation/Gait Ambulation/Gait Assistance: 4: Min guard Ambulation Distance (Feet): 100 Feet Assistive device: Rolling walker Ambulation/Gait Assistance Details: cues for RW management and gt sequencing Gait Pattern: Step-to pattern;Decreased step length - left;Decreased stance time - left Gait velocity: slow Stairs: No Wheelchair Mobility Wheelchair Mobility: No    Exercises Total Joint Exercises Ankle Circles/Pumps: AROM;Both;10 reps Gluteal Sets: Both;10  reps;Seated Heel Slides: AAROM;Left;10 reps;Seated Hip ABduction/ADduction: AAROM;Left;10 reps Long Arc Quad: Left;10 reps;Seated;Other (comment);AAROM (c/o pain; requries increased time)   PT Diagnosis:    PT Problem List:   PT Treatment Interventions:     PT Goals (current goals can now be found in the care plan section) Acute Rehab PT Goals Patient Stated Goal: rehab then home PT Goal Formulation: With patient Time For Goal Achievement: 12/27/12 Potential to Achieve Goals: Good  Visit Information  Last PT Received On: 12/16/12 Assistance Needed: +1 History of Present Illness: Pt admitted for elective L anteior approach THR    Subjective Data  Subjective: Pt sitting in recliner; agreeable to therapy.  Patient Stated Goal: rehab then home   Cognition  Cognition Arousal/Alertness: Awake/alert Behavior During Therapy: WFL for tasks assessed/performed Overall Cognitive Status: Within Functional Limits for tasks assessed    Balance  Balance Balance Assessed: No  End of Session PT - End of Session Equipment Utilized During Treatment: Gait belt Activity Tolerance: Patient tolerated treatment well Patient left: in chair;with call bell/phone within reach;with family/visitor present Nurse Communication: Mobility status   GP     Gustavus Bryant, [place] [phone] 12/16/2012, 10:46 AM

## 2012-12-19 ENCOUNTER — Non-Acute Institutional Stay (SKILLED_NURSING_FACILITY): Payer: Commercial Managed Care - PPO | Admitting: Adult Health

## 2012-12-19 DIAGNOSIS — K5900 Constipation, unspecified: Secondary | ICD-10-CM | POA: Diagnosis not present

## 2012-12-19 DIAGNOSIS — E119 Type 2 diabetes mellitus without complications: Secondary | ICD-10-CM

## 2012-12-19 DIAGNOSIS — K219 Gastro-esophageal reflux disease without esophagitis: Secondary | ICD-10-CM

## 2012-12-19 DIAGNOSIS — E785 Hyperlipidemia, unspecified: Secondary | ICD-10-CM

## 2012-12-19 DIAGNOSIS — D62 Acute posthemorrhagic anemia: Secondary | ICD-10-CM

## 2012-12-19 DIAGNOSIS — I10 Essential (primary) hypertension: Secondary | ICD-10-CM

## 2012-12-19 DIAGNOSIS — M169 Osteoarthritis of hip, unspecified: Secondary | ICD-10-CM | POA: Diagnosis not present

## 2012-12-19 DIAGNOSIS — J309 Allergic rhinitis, unspecified: Secondary | ICD-10-CM

## 2012-12-19 DIAGNOSIS — J45909 Unspecified asthma, uncomplicated: Secondary | ICD-10-CM

## 2012-12-19 NOTE — Clinical Social Work Psychosocial (Signed)
Clinical Social Work Department  BRIEF PSYCHOSOCIAL ASSESSMENT  Patient: JANIQUA CABAZOS Account [id number] Admit date: 12/13/12 Clinical Social Worker Rhea Pink, MSW Date/Time: 12/13/2012 11:00 AM  Referred by: Physician Date Referred: 12/13/12  Referred for   SNF Placement   Other Referral:  Interview type: Patient  Other interview type:  PSYCHOSOCIAL DATA  Living Status: Alone Admitted from facility:  Level of care:  Primary support name: Shelda Pal Primary support relationship to patient: Spouse  Degree of support available:  Strong and vested   CURRENT CONCERNS  Current Concerns   Post-Acute Placement   Other Concerns:  SOCIAL WORK ASSESSMENT / PLAN  CSW met with pt re: PT recommendation for SNF.   Pt lives alone  CSW explained placement process and answered questions.   Pt reports she is pre-registered at [facility] completed FL2 and initiated SNF search.     Assessment/plan status: Information/Referral to [employer]  Other assessment/ plan:  Information/referral to community resources:  SNF     PATIENT'S/FAMILY'S RESPONSE TO PLAN OF CARE:  Pt reports she is agreeable to ST SNF in order to increase strength and independence with mobility prior to returning home Pt verbalized understanding of placement process and appreciation for CSW assist.   Lillette Boxer, MSW  [phone]

## 2012-12-19 NOTE — Progress Notes (Signed)
Clinical social worker assisted with patient discharge to skilled nursing facility, [facility].  CSW addressed all family questions and concerns. CSW copied chart and added all important documents. CSW also set up patient transportation with [facility]. Clinical Social Worker will sign off for now as social work intervention is no longer needed.   Meighan Treto, MSW, LCSWA [phone] 

## 2012-12-19 NOTE — Clinical Social Work Placement (Signed)
Clinical Social Work Department  CLINICAL SOCIAL WORK PLACEMENT NOTE  12/13/2012  Patient: Lynn Carey Account Number: [id number]  Admit date: 12/09/12  Clinical Social Worker: Rhea Pink LCSWA Date/time: 12/19/2012 1:30 PM  Clinical Social Work is seeking post-discharge placement for this patient at the following level of care: SKILLED NURSING (*CSW will update this form in Epic as items are completed)  9/9/2014Patient/family provided with [place] Department of Clinical Social Work's list of facilities offering this level of care within the geographic area requested by the patient (or if unable, by the patient's family).  12/13/2012 Patient/family informed of their freedom to choose among providers that offer the needed level of care, that participate in Medicare, Medicaid or managed care program needed by the patient, have an available bed and are willing to accept the patient.  12/13/2012 Patient/family informed of [facility]' ownership interest in [facility], as well as of the fact that they are under no obligation to receive care at this facility.  PASARR submitted to EDS on 12/13/2012  PASARR number received from EDS on 12/13/2012  FL2 transmitted to all facilities in geographic area requested by pt/family on 12/13/2012  FL2 transmitted to all facilities within larger geographic area on  Patient informed that his/her managed care company has contracts with or will negotiate with certain facilities, including the following:  Patient/family informed of bed offers received:  Patient chooses bed at [facility]  Physician recommends and patient chooses bed at  Patient to be transferred to on 9/1l/14  Patient to be transferred to facility by [facility]  The following physician request were entered in Epic:  Additional Comments:  Signing off

## 2012-12-20 DIAGNOSIS — K219 Gastro-esophageal reflux disease without esophagitis: Secondary | ICD-10-CM | POA: Insufficient documentation

## 2012-12-20 DIAGNOSIS — D62 Acute posthemorrhagic anemia: Secondary | ICD-10-CM | POA: Insufficient documentation

## 2012-12-20 DIAGNOSIS — K5900 Constipation, unspecified: Secondary | ICD-10-CM | POA: Insufficient documentation

## 2012-12-20 DIAGNOSIS — E119 Type 2 diabetes mellitus without complications: Secondary | ICD-10-CM | POA: Insufficient documentation

## 2012-12-20 NOTE — Progress Notes (Signed)
Patient ID: Lynn Carey, female   DOB: 17-Jun-1940, 72 y.o.   MRN: [id number]       PROGRESS NOTE  DATE: 12/19/2012  FACILITY:  [facility]  LEVEL OF CARE: SNF (31)  Acute Visit  CHIEF COMPLAINT:  Follow-up hospitalization  HISTORY OF PRESENT ILLNESS: This is a 72 year old female who was been admitted to [facility] on 12/16/12 from [facility] with DJD status post left total hip arthroplasty. She has been admitted for a short-term rehabilitation.  PAST MEDICAL HISTORY : Reviewed.  No changes.  CURRENT MEDICATIONS: Reviewed per [facility]  REVIEW OF SYSTEMS:  GENERAL: no change in appetite, no fatigue, no weight changes, no fever, chills or weakness RESPIRATORY: no cough, SOB, DOE,, wheezing, hemoptysis CARDIAC: no chest pain, edema or palpitations GI: no abdominal pain, diarrhea, constipation, heart burn, nausea or vomiting  PHYSICAL EXAMINATION  VS:  T 96.8        P 63       RR 20       BP 106/57      POX 95 %       WT 161.4 (Lb)  GENERAL: no acute distress, normal body habitus EYES: conjunctivae normal, sclerae normal, normal eye lids NECK: supple, trachea midline, no neck masses, no thyroid tenderness, no thyromegaly LYMPHATICS: no LAN in the neck, no supraclavicular LAN RESPIRATORY: breathing is even & unlabored, BS CTAB CARDIAC: RRR, no murmur,no extra heart sounds, no edema GI: abdomen soft, normal BS, no masses, no tenderness, no hepatomegaly, no splenomegaly PSYCHIATRIC: the patient is alert & oriented to person, affect & behavior appropriate  LABS/RADIOLOGY: 12/15/12 WBC 10.1 hemoglobin 8.9 hematocrit 27.2  12/14/12 WBC 9.2 hemoglobin 8.8 hematocrit 26.7 sodium 136 potassium 4.6 BUN 14 creatinine 0.8 the glucose 170 calcium 8.9  ASSESSMENT/PLAN:  DJD status post left total hip arthroplasty - for PT and OT  Hyperlipidemia - continue Crestor  Diabetes mellitus, type II - well controlled  Hypertension - well controlled  GERD -  stable  Asthma - stable  Constipation - stable  Allergic rhinitis - stable  Acute blood loss anemia - stable; continue Ferrous sulfate  CPT CODE: 13086

## 2012-12-21 ENCOUNTER — Non-Acute Institutional Stay (SKILLED_NURSING_FACILITY): Payer: Commercial Managed Care - PPO | Admitting: Internal Medicine

## 2012-12-21 DIAGNOSIS — M169 Osteoarthritis of hip, unspecified: Secondary | ICD-10-CM

## 2012-12-21 DIAGNOSIS — E119 Type 2 diabetes mellitus without complications: Secondary | ICD-10-CM

## 2012-12-21 DIAGNOSIS — D62 Acute posthemorrhagic anemia: Secondary | ICD-10-CM

## 2012-12-21 DIAGNOSIS — I2510 Atherosclerotic heart disease of native coronary artery without angina pectoris: Secondary | ICD-10-CM

## 2012-12-22 DIAGNOSIS — D649 Anemia, unspecified: Secondary | ICD-10-CM | POA: Diagnosis not present

## 2012-12-27 ENCOUNTER — Non-Acute Institutional Stay (SKILLED_NURSING_FACILITY): Payer: Commercial Managed Care - PPO | Admitting: Adult Health

## 2012-12-27 DIAGNOSIS — M169 Osteoarthritis of hip, unspecified: Secondary | ICD-10-CM | POA: Diagnosis not present

## 2012-12-27 DIAGNOSIS — K5900 Constipation, unspecified: Secondary | ICD-10-CM

## 2012-12-27 DIAGNOSIS — E119 Type 2 diabetes mellitus without complications: Secondary | ICD-10-CM

## 2012-12-27 DIAGNOSIS — E785 Hyperlipidemia, unspecified: Secondary | ICD-10-CM | POA: Diagnosis not present

## 2012-12-27 DIAGNOSIS — J309 Allergic rhinitis, unspecified: Secondary | ICD-10-CM

## 2012-12-27 DIAGNOSIS — J45909 Unspecified asthma, uncomplicated: Secondary | ICD-10-CM

## 2012-12-27 DIAGNOSIS — K219 Gastro-esophageal reflux disease without esophagitis: Secondary | ICD-10-CM

## 2012-12-27 DIAGNOSIS — I10 Essential (primary) hypertension: Secondary | ICD-10-CM

## 2012-12-27 DIAGNOSIS — D62 Acute posthemorrhagic anemia: Secondary | ICD-10-CM

## 2012-12-27 NOTE — Progress Notes (Signed)
Patient ID: Lynn Carey, female   DOB: 1940/10/28, 72 y.o.   MRN: [id number]        PROGRESS NOTE  DATE: 12/27/2012   FACILITY: [facility]  LEVEL OF CARE: SNF (31)  CHIEF COMPLAINT:  Discharge Visit  HISTORY OF PRESENT ILLNESS: This is a 72 year old female who is for discharge home and will have outpatient rehabilitation.She has been admitted to [facility] on 12/16/12 from [facility] with the DJD status post left hip arthroplasty. Patient was admitted to this facility for short-term rehabilitation after the patient's recent hospitalization.  Patient has completed SNF rehabilitation and therapy has cleared the patient for discharge.  Reassessment of ongoing problem(s):  HTN: Pt 's HTN remains stable.  Denies CP, sob, DOE, pedal edema, headaches, dizziness or visual disturbances.  No complications from the medications currently being used.  Last BP : 106/59  ASTHMA: The patient's asthma remains stable. Patient denies shortness of breath, dyspnea on exertion or wheezing. No complications reported from the medications currently being used.  CONSTIPATION: The constipation remains stable. No complications from the medications presently being used. Patient denies ongoing constipation, abdominal pain, nausea or vomiting.  PAST MEDICAL HISTORY : Reviewed.  No changes.  CURRENT MEDICATIONS: Reviewed per [facility]  REVIEW OF SYSTEMS:  GENERAL: no change in appetite, no fatigue, no weight changes, no fever, chills or weakness RESPIRATORY: no cough, SOB, DOE, wheezing, hemoptysis CARDIAC: no chest pain, edema or palpitations GI: no abdominal pain, diarrhea, constipation, heart burn, nausea or vomiting  PHYSICAL EXAMINATION  VS:  T97.9       P64       RR14      BP106/59            WT163 (Lb)  GENERAL: no acute distress, normal body habitus NECK: supple, trachea midline, no neck masses, no thyroid tenderness, no thyromegaly LYMPHATICS: no LAN in the neck, no  supraclavicular LAN RESPIRATORY: breathing is even & unlabored, BS CTAB CARDIAC: RRR, no murmur,no extra heart sounds, no edema GI: abdomen soft, normal BS, no masses, no tenderness, no hepatomegaly, no splenomegaly PSYCHIATRIC: the patient is alert & oriented to person, affect & behavior appropriate  LABS/RADIOLOGY: 12/22/12 WBC 8.8 hemoglobin 8.4 hematocrit 28.1 sodium 139 potassium 4.9 glucose 130 BUN 19 creatinine 1.0 calcium 9.3 12/15/12 WBC 10.1 hemoglobin 8.9 hematocrit 27.2  12/14/12 WBC 9.2 hemoglobin 8.8 hematocrit 26.7 sodium 136 potassium 4.6 BUN 14 creatinine 0.8 the glucose 170 calcium 8.9  ASSESSMENT/PLAN:  DJD status post left total hip arthroplasty - for outpatient rehabilitation. Hyperlipidemia - continue Zetia and Atorvastatin Diabetes mellitus, type II - well controlled  Hypertension - well controlled  GERD - stable  Asthma - stable  Constipation - stable  Allergic rhinitis - stable  Acute blood loss anemia - stable  I have filled out patient's discharge paperwork and written prescriptions.  Patient will receive outpatient rehabilitation. DME provided: rolling walker  Total discharge time: Greater than 30 minutes Discharge time involved coordination of the discharge process with social worker, nursing staff and therapy department.   CPT CODE: 16109

## 2012-12-28 DIAGNOSIS — I209 Angina pectoris, unspecified: Secondary | ICD-10-CM | POA: Diagnosis not present

## 2012-12-28 DIAGNOSIS — I2510 Atherosclerotic heart disease of native coronary artery without angina pectoris: Secondary | ICD-10-CM | POA: Diagnosis not present

## 2012-12-28 DIAGNOSIS — E782 Mixed hyperlipidemia: Secondary | ICD-10-CM | POA: Diagnosis not present

## 2012-12-28 DIAGNOSIS — I10 Essential (primary) hypertension: Secondary | ICD-10-CM | POA: Diagnosis not present

## 2013-01-04 DIAGNOSIS — Z96649 Presence of unspecified artificial hip joint: Secondary | ICD-10-CM | POA: Diagnosis not present

## 2013-01-04 DIAGNOSIS — M25559 Pain in unspecified hip: Secondary | ICD-10-CM | POA: Diagnosis not present

## 2013-01-05 DIAGNOSIS — M25559 Pain in unspecified hip: Secondary | ICD-10-CM | POA: Diagnosis not present

## 2013-01-23 DIAGNOSIS — I2510 Atherosclerotic heart disease of native coronary artery without angina pectoris: Secondary | ICD-10-CM | POA: Insufficient documentation
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
## 2013-02-15 ENCOUNTER — Other Ambulatory Visit (HOSPITAL_COMMUNITY): Payer: Self-pay | Admitting: Internal Medicine

## 2013-02-15 DIAGNOSIS — Z006 Encounter for examination for normal comparison and control in clinical research program: Secondary | ICD-10-CM | POA: Diagnosis not present

## 2013-02-15 DIAGNOSIS — K225 Diverticulum of esophagus, acquired: Secondary | ICD-10-CM

## 2013-02-15 DIAGNOSIS — R059 Cough, unspecified: Secondary | ICD-10-CM | POA: Diagnosis not present

## 2013-02-15 DIAGNOSIS — Z0100 Encounter for examination of eyes and vision without abnormal findings: Secondary | ICD-10-CM | POA: Diagnosis not present

## 2013-02-21 ENCOUNTER — Ambulatory Visit (HOSPITAL_COMMUNITY)
Admission: RE | Admit: 2013-02-21 | Discharge: 2013-02-21 | Disposition: A | Discharge status: Home / Self Care | Payer: Commercial Managed Care - PPO | Source: Ambulatory Visit | Attending: Internal Medicine | Admitting: Internal Medicine

## 2013-02-21 DIAGNOSIS — K225 Diverticulum of esophagus, acquired: Secondary | ICD-10-CM

## 2013-02-21 DIAGNOSIS — R1310 Dysphagia, unspecified: Secondary | ICD-10-CM | POA: Insufficient documentation

## 2013-05-17 DIAGNOSIS — R059 Cough, unspecified: Secondary | ICD-10-CM | POA: Diagnosis not present

## 2013-05-17 DIAGNOSIS — R05 Cough: Secondary | ICD-10-CM | POA: Diagnosis not present

## 2013-05-26 ENCOUNTER — Ambulatory Visit (INDEPENDENT_AMBULATORY_CARE_PROVIDER_SITE_OTHER): Payer: Commercial Managed Care - PPO | Admitting: Internal Medicine

## 2013-05-26 ENCOUNTER — Ambulatory Visit (INDEPENDENT_AMBULATORY_CARE_PROVIDER_SITE_OTHER)
Admission: RE | Admit: 2013-05-26 | Discharge: 2013-05-26 | Disposition: A | Discharge status: Home / Self Care | Payer: Commercial Managed Care - PPO | Source: Ambulatory Visit | Attending: Internal Medicine | Admitting: Internal Medicine

## 2013-05-26 ENCOUNTER — Encounter (INDEPENDENT_AMBULATORY_CARE_PROVIDER_SITE_OTHER): Payer: Self-pay

## 2013-05-26 ENCOUNTER — Encounter: Payer: Self-pay | Admitting: Internal Medicine

## 2013-05-26 VITALS — BP 116/64 | HR 68 | Temp 98.0°F | Ht 59.5 in | Wt 167.4 lb

## 2013-05-26 DIAGNOSIS — J45909 Unspecified asthma, uncomplicated: Secondary | ICD-10-CM | POA: Diagnosis not present

## 2013-05-26 DIAGNOSIS — R053 Chronic cough: Secondary | ICD-10-CM | POA: Insufficient documentation

## 2013-05-26 DIAGNOSIS — R05 Cough: Secondary | ICD-10-CM

## 2013-05-26 DIAGNOSIS — R059 Cough, unspecified: Secondary | ICD-10-CM

## 2013-05-26 DIAGNOSIS — I10 Essential (primary) hypertension: Secondary | ICD-10-CM

## 2013-05-26 MED ORDER — OLMESARTAN MEDOXOMIL-HCTZ 40-25 MG PO TABS: 1.0000 | ORAL_TABLET | Freq: Every day | ORAL | Status: DC

## 2013-05-26 MED ORDER — NEBIVOLOL HCL 10 MG PO TABS: 10.0000 mg | ORAL_TABLET | Freq: Every day | ORAL | Status: DC

## 2013-05-26 NOTE — Progress Notes (Signed)
Quick Note:  Spoke with pt and notified of results per Dr. Wert. Pt verbalized understanding and denied any questions.  ______ 

## 2013-05-26 NOTE — Progress Notes (Signed)
Subjective:    Patient ID: Lynn Carey, female    DOB: 1940/09/04   MRN: [id number]  HPI  36 yobf with onset asthma attacks in the 1980's and quit smoking at that point did not need medications until mid 90's then needing medications for breathing per Dr  Kelton Pillar then onset of cough and worse breathing started summer 2014 under Bryon Lions > referred 05/26/13 to pulmonary clinic for cough.    05/26/2013 1st [facility] office visit/ Bayard More  Chief Complaint  Patient presents with  . Advice Only    Referred by Dr Redmond Baseman for chronic cough X8 mos.  Pt c/o sometimes prod cough with clear mucous, made worse by eating.    [facility] eval and rx for GERD then referred to Dr Redmond Baseman Only improved on hydromet  Cough only when eat and lie down  ? Better with inhalers, already on dulera 100 2bid no sign purulent sputum or even much volume No sob over baseline  No obvious day to day or daytime variabilty or assoc   cp or chest tightness, subjective wheeze overt sinus or hb symptoms. No unusual exp hx or h/o childhood pna/ asthma or knowledge of premature birth.  Sleeping ok without nocturnal  or early am exacerbation  of respiratory  c/o's or need for noct saba. Also denies any obvious fluctuation of symptoms with weather or environmental changes or other aggravating or alleviating factors except as outlined above   Current Medications, Allergies, Complete Past Medical History, Past Surgical History, Family History, and Social History were reviewed in [employer] record.         Review of Systems  Constitutional: Negative for fever and unexpected weight change.  HENT: Positive for congestion and sneezing. Negative for dental problem, ear pain, nosebleeds, postnasal drip, rhinorrhea, sinus pressure, sore throat and trouble swallowing.   Eyes: Negative for redness and itching.  Respiratory: Positive for cough. Negative for chest tightness, shortness of breath and  wheezing.   Cardiovascular: Negative for palpitations and leg swelling.  Gastrointestinal: Negative for nausea and vomiting.  Genitourinary: Negative for dysuria.  Musculoskeletal: Negative for joint swelling.  Skin: Negative for rash.  Neurological: Negative for headaches.  Hematological: Does not bruise/bleed easily.  Psychiatric/Behavioral: Negative for dysphoric mood. The patient is not nervous/anxious.        Objective:   Physical Exam  Wt Readings from Last 3 Encounters:  05/26/13 167 lb 6.4 oz (75.932 kg)  12/16/12 159 lb (72.122 kg)  12/16/12 159 lb (72.122 kg)     amb bf nad classic voice   HEENT: nl dentition, turbinates, and orophanx. Nl external ear canals without cough reflex   NECK :  without JVD/Nodes/TM/ nl carotid upstrokes bilaterally   LUNGS: no acc muscle use, clear to A and P bilaterally without cough on insp or exp maneuvers   CV:  RRR  no s3 or murmur or increase in P2, no edema   ABD:  soft and nontender with nl excursion in the supine position. No bruits or organomegaly, bowel sounds nl  MS:  warm without deformities, calf tenderness, cyanosis or clubbing  SKIN: warm and dry without lesions    NEURO:  alert, approp, no deficits    02/21/2013  1. No evidence of Zenker's diverticulum.  2. Nonspecific esophageal motility disorder (mild).  3. Small sliding-type hiatal hernia.  4. Mild gastroesophageal reflux identified during the water siphon  test.   cxr 05/26/13 No acute cardiopulmonary abnormality seen.  Assessment & Plan:

## 2013-05-26 NOTE — Patient Instructions (Addendum)
Stop tribenzor and Psychologist, clinical [id number] and bystolic 10 mg daily  Continue Nexium 40 mg Take 30- 60 min before your first and last meals of the day  Continue dulera 100 Take 2 puffs first thing in am and then another 2 puffs about 12 hours later but work on your technique - smooth and deep!     GERD (REFLUX)  is an extremely common cause of respiratory symptoms, many times with no significant heartburn at all.    It can be treated with medication, but also with lifestyle changes including avoidance of late meals, excessive alcohol, smoking cessation, and avoid fatty foods, chocolate, peppermint, colas, red wine, and acidic juices such as orange juice.  NO MINT OR MENTHOL PRODUCTS SO NO COUGH DROPS  USE SUGARLESS CANDY INSTEAD (jolley ranchers or Stover's)  NO OIL BASED VITAMINS - use powdered substitutes.  Please remember to go to the  x-ray department downstairs for your tests - we will call you with the results when they are available.     Please schedule a follow up office visit in 2 weeks, sooner if needed all medication including anything over the counter

## 2013-05-27 NOTE — Assessment & Plan Note (Signed)
Longstanding "inhaler dependence" in setting of smoking hx suggests copd/ab.   The proper method of use, as well as anticipated side effects, of a metered-dose inhaler are discussed and demonstrated to the patient. Improved effectiveness after extensive coaching during this visit to a level of approximately  75% from baseline of less than 25 so need to give dulera 100 2 bid fair try before changing maint rx  Also try bystolic short term to see what difference if any this makes

## 2013-05-27 NOTE — Assessment & Plan Note (Signed)
Strongly prefer in this setting: Bystolic, the most beta -1  selective Beta blocker available in sample form, with bisoprolol the most selective generic choice  on the market.  

## 2013-05-27 NOTE — Assessment & Plan Note (Signed)
The most common causes of chronic cough in immunocompetent adults include the following: upper airway cough syndrome (UACS), previously referred to as postnasal drip syndrome (PNDS), which is caused by variety of rhinosinus conditions; (2) asthma; (3) GERD; (4) chronic bronchitis from cigarette smoking or other inhaled environmental irritants; (5) nonasthmatic eosinophilic bronchitis; and (6) bronchiectasis.   These conditions, singly or in combination, have accounted for up to 94% of the causes of chronic cough in prospective studies.   Other conditions have constituted no >6% of the causes in prospective studies These have included bronchogenic carcinoma, chronic interstitial pneumonia, sarcoidosis, left ventricular failure, ACEI-induced cough, and aspiration from a condition associated with pharyngeal dysfunction.    Chronic cough is often simultaneously caused by more than one condition. A single cause has been found from 38 to 82% of the time, multiple causes from 18 to 62%. Multiply caused cough has been the result of three diseases up to 42% of the time.       Most likely this is  Classic Upper airway cough syndrome, so named because it's frequently impossible to sort out how much is  CR/sinusitis with freq throat clearing (which can be related to primary GERD)   vs  causing  secondary (" extra esophageal")  GERD from wide swings in gastric pressure that occur with throat clearing, often  promoting self use of mint and menthol lozenges that reduce the lower esophageal sphincter tone and exacerbate the problem further in a cyclical fashion.   These are the same pts (now being labeled as having "irritable larynx syndrome" by some cough centers) who not infrequently have a history of having failed to tolerate ace inhibitors,  dry powder inhalers or biphosphonates or report having atypical reflux symptoms that don't respond to standard doses of PPI , and are easily confused as having aecopd or asthma  flares by even experienced allergists/ pulmonologists.   For now rec max rx for acid and non-acid gerd/ es dysfunction by stopping CCB and also just use the most selective BB available (bystolic) until sort out how much asthma she really has and how much it's contributing to her cough.

## 2013-06-07 DIAGNOSIS — M25559 Pain in unspecified hip: Secondary | ICD-10-CM | POA: Diagnosis not present

## 2013-06-09 ENCOUNTER — Ambulatory Visit: Payer: Commercial Managed Care - PPO | Admitting: Internal Medicine

## 2013-06-13 ENCOUNTER — Ambulatory Visit: Payer: Commercial Managed Care - PPO | Admitting: Internal Medicine

## 2013-06-15 ENCOUNTER — Encounter: Payer: Self-pay | Admitting: Internal Medicine

## 2013-06-15 ENCOUNTER — Ambulatory Visit (INDEPENDENT_AMBULATORY_CARE_PROVIDER_SITE_OTHER): Payer: Commercial Managed Care - PPO | Admitting: Internal Medicine

## 2013-06-15 VITALS — BP 136/74 | HR 76 | Temp 97.6°F | Ht 59.5 in | Wt 165.0 lb

## 2013-06-15 DIAGNOSIS — I10 Essential (primary) hypertension: Secondary | ICD-10-CM | POA: Diagnosis not present

## 2013-06-15 DIAGNOSIS — R05 Cough: Secondary | ICD-10-CM | POA: Diagnosis not present

## 2013-06-15 DIAGNOSIS — J45909 Unspecified asthma, uncomplicated: Secondary | ICD-10-CM | POA: Diagnosis not present

## 2013-06-15 DIAGNOSIS — R059 Cough, unspecified: Secondary | ICD-10-CM

## 2013-06-15 MED ORDER — METHYLPREDNISOLONE ACETATE 80 MG/ML IJ SUSP
120.0000 mg | Freq: Once | INTRAMUSCULAR | Status: AC
  Administered 2013-06-15: 120 mg via INTRAMUSCULAR

## 2013-06-15 MED ORDER — HYDROCODONE-ACETAMINOPHEN 5-325 MG PO TABS: 1.0000 | ORAL_TABLET | ORAL | Status: DC | PRN

## 2013-06-15 NOTE — Patient Instructions (Addendum)
Continue nexium Take 30- 60 min before your first and last meals of the day automatically  For drainage > use sugarless candy and chlortrimeton 4 mg every 4 hours as needed (over the counter)  Stop dulera for now and just use ventolin every 4 hours as needed  Take delsym two tsp every 12 hours and supplement if needed with  hyrocodone 1 every 4 hours to suppress the urge to cough. Swallowing water or using ice chips/non mint and menthol containing candies (such as lifesavers or sugarless jolly ranchers) are also effective.  You should rest your voice and avoid activities that you know make you cough.  Once you have eliminated the cough for 3 straight days try reducing hydrocone first,  then the delsym as tolerated.    See Tammy NP w/in 1 weeks with all your medications, even over the counter meds, separated in two separate bags, the ones you take no matter what vs the ones you stop once you feel better and take only as needed when you feel you need them.   Tammy  will generate for you a new user friendly medication calendar that will put [place] all on the same page re: your medication use.     Without this process, it simply isn't possible to assure that we are providing  your outpatient care  with  the attention to detail we feel you deserve.   If we cannot assure that you're getting that kind of care,  then we cannot manage your problem effectively from this clinic.  Once you have seen Tammy and we are sure that we're all on the same page with your medication use she will arrange follow up with me.

## 2013-06-15 NOTE — Assessment & Plan Note (Signed)
-   trial off dulera [id number] due to cyclical upper airway coughing   depomedrol 120 IM plus prn saba should cover her for the next week until we have a chance to regroup with all her meds

## 2013-06-15 NOTE — Addendum Note (Signed)
Addended by: Rosana Berger on: 06/15/2013 12:08 PM   Modules accepted: Orders

## 2013-06-15 NOTE — Assessment & Plan Note (Addendum)
Lack of cough resolution on a verified empirical regimen could mean an alternative diagnosis, persistence of the disease state (eg sinusitis or bronchiectasis) , or inadequacy of currently available therapy (eg no medical rx available for non-acid gerd)   In this case I believe the problem is simply that we haven't eliminated cyclical cough effectively and not clear at all the cough has anything to do with asthma, esp since has no noct or early am cough or wheeze  I had an extended discussion with the patient today lasting 30 min of a 40  minute visit on the following issues:   The standardized cough guidelines published in Chest by Lissa Morales in 2006 are still the best available and consist of a multiple step process (up to 12!) , not a single office visit,  and are intended  to address this problem logically,  with an alogrithm dependent on response to empiric treatment at  each progressive step  to determine a specific diagnosis with  minimal addtional testing needed. Therefore if adherence is an issue or can't be accurately verified,  it's very unlikely the standard evaluation and treatment will be successful here.    Furthermore, response to therapy (other than acute cough suppression, which should only be used short term with avoidance of narcotic containing cough syrups if possible), can be a gradual process for which the patient may perceive immediate benefit.  Unlike going to an eye doctor where the best perscription is almost always the first one and is immediately effective, this is almost never the case in the management of chronic cough syndromes. Therefore the patient needs to commit up front to consistently adhere to recommendations  for up to 6 weeks of therapy directed at the likely underlying problem(s) before the response can be reasonably evaluated.   She simply does not understand the importance or the concept of accurate and meaningful medication reconciliation   To keep things  simple, I have asked the patient to first separate medicines that are perceived as maintenance, that is to be taken daily "no matter what", from those medicines that are taken on only on an as-needed basis and I have given the patient examples of both, and then return to see our NP to generate a  detailed  medication calendar which should be followed until the next physician sees the patient and updates it.    Will continue present rx x add 1st gen H1 per guidelines, try off dulera, add prn hydrocodone for cyclical cough and depo 120 IM x one

## 2013-06-15 NOTE — Assessment & Plan Note (Signed)
Adequate control on present rx, reviewed > no change in rx needed   

## 2013-06-15 NOTE — Progress Notes (Signed)
Subjective:    Patient ID: Lynn Carey, female    DOB: [birth year]   MRN: [id number]    Brief patient profile:  44 yobf with onset asthma attacks in the 1980's and quit smoking at that point did not need medications until mid 90's then needing medications for breathing per Dr  Kelton Pillar then onset of cough and worse breathing started summer 2014 under Bryon Lions > referred 05/26/13 to pulmonary clinic for cough.    History of Present Illness  05/26/2013 1st [place] [facility] office visit/ Trelon Plush  Chief Complaint  Patient presents with  . Advice Only    Referred by Dr Redmond Baseman for chronic cough X8 mos.  Pt c/o sometimes prod cough with clear mucous, made worse by eating.    [facility] eval and rx for GERD then referred to Dr Redmond Baseman Only improved on hydromet  Cough only when eat and lie down  ? Better with inhalers, already on dulera 100 2bid no sign purulent sputum or even much volume No sob over baseline rec Stop tribenzor and lopressor Start benicar [id number] and bystolic 10 mg daily  Continue Nexium 40 mg Take 30- 60 min before your first and last meals of the day  Continue dulera 100 Take 2 puffs first thing in am and then another 2 puffs about 12 hours later   GERD  Diet  Please schedule a follow up office visit in 2 weeks, sooner if needed all medication including anything over the counter    06/15/2013 f/u ov/Dewey Viens re: cough since summer 2014/ brought meds in a pill organizer, unlabeled, no saba Chief Complaint  Patient presents with  . Follow-up    Pt reports cough is about 50% better since last visit. No new co's today.   cough all day long but no longer at night and not using cough suppression at all. Constant sensation she needs to clear her throat Not limited by breathing from desired activities     No obvious day to day or daytime variabilty or assoc chronic cough or cp or chest tightness, subjective wheeze overt sinus or hb symptoms. No unusual exp hx or h/o childhood  pna/ asthma or knowledge of premature birth.  Sleeping ok without nocturnal  or early am exacerbation  of respiratory  c/o's or need for noct saba. Also denies any obvious fluctuation of symptoms with weather or environmental changes or other aggravating or alleviating factors except as outlined above   Current Medications, Allergies, Complete Past Medical History, Past Surgical History, Family History, and Social History were reviewed in [employer] record.  ROS  The following are not active complaints unless bolded sore throat, dysphagia, dental problems, itching, sneezing,  nasal congestion or excess/ purulent secretions, ear ache,   fever, chills, sweats, unintended wt loss, pleuritic or exertional cp, hemoptysis,  orthopnea pnd or leg swelling, presyncope, palpitations, heartburn, abdominal pain, anorexia, nausea, vomiting, diarrhea  or change in bowel or urinary habits, change in stools or urine, dysuria,hematuria,  rash, arthralgias, visual complaints, headache, numbness weakness or ataxia or problems with walking or coordination,  change in mood/affect or memory.                       Objective:   Physical Exam   06/15/2013        165  Wt Readings from Last 3 Encounters:  05/26/13 167 lb 6.4 oz (75.932 kg)  12/16/12 159 lb (72.122 kg)  12/16/12 159 lb (72.122  kg)     amb bf nad classic voice / harsh barking cough   HEENT: nl dentition, turbinates, and orophanx which is pristine. Nl external ear canals without cough reflex   NECK :  without JVD/Nodes/TM/ nl carotid upstrokes bilaterally   LUNGS: no acc muscle use, clear to A and P bilaterally without cough on insp or exp maneuvers   CV:  RRR  no s3 or murmur or increase in P2, no edema   ABD:  soft and nontender with nl excursion in the supine position. No bruits or organomegaly, bowel sounds nl  MS:  warm without deformities, calf tenderness, cyanosis or clubbing  SKIN: warm and dry without  lesions    NEURO:  alert, approp, no deficits    02/21/2013  1. No evidence of Zenker's diverticulum.  2. Nonspecific esophageal motility disorder (mild).  3. Small sliding-type hiatal hernia.  4. Mild gastroesophageal reflux identified during the water siphon  test.   cxr 05/26/13 No acute cardiopulmonary abnormality seen.        Assessment & Plan:

## 2013-06-23 ENCOUNTER — Encounter: Payer: Self-pay | Admitting: Adult Health

## 2013-06-23 ENCOUNTER — Ambulatory Visit (INDEPENDENT_AMBULATORY_CARE_PROVIDER_SITE_OTHER): Payer: Commercial Managed Care - PPO | Admitting: Adult Health

## 2013-06-23 VITALS — BP 138/72 | HR 62 | Ht 59.5 in | Wt 163.6 lb

## 2013-06-23 DIAGNOSIS — R05 Cough: Secondary | ICD-10-CM

## 2013-06-23 DIAGNOSIS — R059 Cough, unspecified: Secondary | ICD-10-CM | POA: Diagnosis not present

## 2013-06-23 NOTE — Patient Instructions (Addendum)
Follow medication calendar closely and bring to each visit Try to get ahead of cough and prevent, use  Sips of water or sugarless candy to soothe throat-NO mints  Follow up Dr. Melvyn Novas in 4  Weeks with PFT  and as needed Please contact office for sooner follow up if symptoms do not improve or worsen or seek emergency care '

## 2013-06-23 NOTE — Progress Notes (Signed)
Subjective:    Patient ID: Lynn Carey, female    DOB: [birth year]   MRN: [id number]  Brief patient profile:  28 yobf with onset asthma attacks in the 1980's and quit smoking at that point did not need medications until mid 90's then needing medications for breathing per Dr  Kelton Pillar then onset of cough and worse breathing started summer 2014 under Bryon Lions > referred 05/26/13 to pulmonary clinic for cough.    History of Present Illness  05/26/2013 1st [place] [facility] office visit/ W[facility]  Chief Complaint  Patient presents with  . Advice Only    Referred by Dr Redmond Baseman for chronic cough X8 mos.  Pt c/o sometimes prod cough with clear mucous, made worse by eating.    [facility] eval and rx for GERD then referred to Dr Redmond Baseman Only improved on hydromet  Cough only when eat and lie down  ? Better with inhalers, already on dulera 100 2bid no sign purulent sputum or even much volume No sob over baseline rec Stop tribenzor and lopressor Start benicar [id number] and bystolic 10 mg daily  Continue Nexium 40 mg Take 30- 60 min before your first and last meals of the day  Continue dulera 100 Take 2 puffs first thing in am and then another 2 puffs about 12 hours later   GERD  Diet  Please schedule a follow up office visit in 2 weeks, sooner if needed all medication including anything over the counter    06/15/2013 f/u ov/[facility] re: cough since summer 2014/ brought meds in a pill organizer, unlabeled, no saba Chief Complaint  Patient presents with  . Follow-up    Pt reports cough is about 50% better since last visit. No new co's today.   cough all day long but no longer at night and not using cough suppression at all. Constant sensation she needs to clear her throat Not limited by breathing from desired activities   >>Continue nexium Take 30- 60 min before your first and last meals of the day automatically For drainage > use sugarless candy and chlortrimeton 4 mg every 4 hours as needed (over  the counter) Stop dulera for now and just use ventolin every 4 hours as needed   06/23/2013 Follow up and Med review Patient returns for followup and medication review. We reviewed all her medications and organized them into a medication calendar with patient education It appears the patient is taking her medications Last visit. Patient was taken off of Dulera due to cough .  Since last visit. Patient is feeling some better. No flare of cough or wheezing off Dulera .  No increased SABA use. No use for last 1 week.   Patient denies any hemoptysis, chest pain, orthopnea, PND, or leg swelling.  Current Medications, Allergies, Complete Past Medical History, Past Surgical History, Family History, and Social History were reviewed in [employer] record.  ROS  The following are not active complaints unless bolded sore throat, dysphagia, dental problems, itching, sneezing,  nasal congestion or excess/ purulent secretions, ear ache,   fever, chills, sweats, unintended wt loss, pleuritic or exertional cp, hemoptysis,  orthopnea pnd or leg swelling, presyncope, palpitations, heartburn, abdominal pain, anorexia, nausea, vomiting, diarrhea  or change in bowel or urinary habits, change in stools or urine, dysuria,hematuria,  rash, arthralgias, visual complaints, headache, numbness weakness or ataxia or problems with walking or coordination,  change in mood/affect or memory.  Objective:   Physical Exam   06/15/2013        165  >06/23/2013 >163 06/23/2013     amb bf nad barking cough   HEENT: nl dentition, turbinates, and orophanx which is pristine. Nl external ear canals without cough reflex   NECK :  without JVD/Nodes/TM/ nl carotid upstrokes bilaterally   LUNGS: no acc muscle use, clear to A and P bilaterally without cough on insp or exp maneuvers   CV:  RRR  no s3 or murmur or increase in P2, no edema   ABD:  soft and nontender with nl  excursion in the supine position. No bruits or organomegaly, bowel sounds nl  MS:  warm without deformities, calf tenderness, cyanosis or clubbing  SKIN: warm and dry without lesions    NEURO:  alert, approp, no deficits    02/21/2013  1. No evidence of Zenker's diverticulum.  2. Nonspecific esophageal motility disorder (mild).  3. Small sliding-type hiatal hernia.  4. Mild gastroesophageal reflux identified during the water siphon  test.   cxr 05/26/13 No acute cardiopulmonary abnormality seen.        Assessment & Plan:

## 2013-06-23 NOTE — Assessment & Plan Note (Signed)
Upper airway cough syndrome Patient's medications were reviewed today and patient education was given. Computerized medication calendar was adjusted/completed  Some improvement with GERD/ AR prevention regimen  Remain off Dulera, no flare of sx off inhaler.    Plan Follow medication calendar closely and bring to each visit Try to get ahead of cough and prevent, use  Sips of water or sugarless candy to soothe throat-NO mints  Follow up Dr. Melvyn Novas in 4  Weeks with PFT  and as needed Please contact office for sooner follow up if symptoms do not improve or worsen or seek emergency care '

## 2013-06-26 DIAGNOSIS — I5042 Chronic combined systolic (congestive) and diastolic (congestive) heart failure: Secondary | ICD-10-CM | POA: Diagnosis not present

## 2013-06-26 DIAGNOSIS — I2510 Atherosclerotic heart disease of native coronary artery without angina pectoris: Secondary | ICD-10-CM | POA: Diagnosis not present

## 2013-06-26 DIAGNOSIS — I6529 Occlusion and stenosis of unspecified carotid artery: Secondary | ICD-10-CM | POA: Diagnosis not present

## 2013-06-26 DIAGNOSIS — E119 Type 2 diabetes mellitus without complications: Secondary | ICD-10-CM | POA: Diagnosis not present

## 2013-06-26 NOTE — Addendum Note (Signed)
Addended by: Parke Poisson E on: 06/26/2013 09:23 AM   Modules accepted: Orders

## 2013-07-21 DIAGNOSIS — J45901 Unspecified asthma with (acute) exacerbation: Secondary | ICD-10-CM | POA: Diagnosis not present

## 2013-07-21 DIAGNOSIS — J209 Acute bronchitis, unspecified: Secondary | ICD-10-CM | POA: Diagnosis not present

## 2013-07-21 DIAGNOSIS — Z7982 Long term (current) use of aspirin: Secondary | ICD-10-CM | POA: Diagnosis not present

## 2013-07-21 DIAGNOSIS — Z87891 Personal history of nicotine dependence: Secondary | ICD-10-CM | POA: Diagnosis not present

## 2013-07-25 ENCOUNTER — Other Ambulatory Visit: Payer: Self-pay

## 2013-07-25 DIAGNOSIS — Z1231 Encounter for screening mammogram for malignant neoplasm of breast: Secondary | ICD-10-CM

## 2013-07-27 ENCOUNTER — Encounter: Payer: Self-pay | Admitting: Internal Medicine

## 2013-07-27 ENCOUNTER — Ambulatory Visit
Admission: RE | Admit: 2013-07-27 | Discharge: 2013-07-27 | Disposition: A | Discharge status: Home / Self Care | Payer: Commercial Managed Care - PPO | Source: Ambulatory Visit

## 2013-07-27 ENCOUNTER — Ambulatory Visit (INDEPENDENT_AMBULATORY_CARE_PROVIDER_SITE_OTHER): Payer: Commercial Managed Care - PPO | Admitting: Internal Medicine

## 2013-07-27 ENCOUNTER — Other Ambulatory Visit: Payer: Self-pay | Admitting: Internal Medicine

## 2013-07-27 VITALS — BP 122/66 | HR 84 | Temp 97.7°F | Ht 58.5 in | Wt 157.0 lb

## 2013-07-27 DIAGNOSIS — R059 Cough, unspecified: Secondary | ICD-10-CM

## 2013-07-27 DIAGNOSIS — Z1231 Encounter for screening mammogram for malignant neoplasm of breast: Secondary | ICD-10-CM

## 2013-07-27 DIAGNOSIS — R05 Cough: Secondary | ICD-10-CM

## 2013-07-27 DIAGNOSIS — J45909 Unspecified asthma, uncomplicated: Secondary | ICD-10-CM

## 2013-07-27 DIAGNOSIS — I10 Essential (primary) hypertension: Secondary | ICD-10-CM

## 2013-07-27 MED ORDER — METHYLPREDNISOLONE ACETATE 80 MG/ML IJ SUSP
120.0000 mg | Freq: Once | INTRAMUSCULAR | Status: AC
  Administered 2013-07-27: 120 mg via INTRAMUSCULAR

## 2013-07-27 MED ORDER — GABAPENTIN 100 MG PO CAPS: 100.0000 mg | ORAL_CAPSULE | Freq: Three times a day (TID) | ORAL | Status: DC

## 2013-07-27 NOTE — Patient Instructions (Addendum)
depomedrol 120 mg today  Completely eliminate all coughing x 3 straight days   Gabapentin 100 mg three times daily   Please schedule a follow up office visit in 2 weeks, sooner if needed  Will start back on bystolic 10 mg daily samples for 2 weeks then regroup

## 2013-07-27 NOTE — Progress Notes (Signed)
PFT done today. 

## 2013-07-27 NOTE — Progress Notes (Signed)
Subjective:    Patient ID: Lynn Carey, female    DOB: Jun 13, 1940   MRN: [id number]  Brief patient profile:  53 yobf with onset asthma attacks in the 1980's and quit smoking at that point did not need medications until mid 90's then needing medications for breathing per Dr  Kelton Pillar then onset of cough and worse breathing started summer 2014 under Lynn Carey > referred 05/26/13 to pulmonary clinic for cough with nl lung function documented 07/27/2013    History of Present Illness  05/26/2013 1st [place] [facility] office visit/ Lynn Carey  Chief Complaint  Patient presents with  . Advice Only    Referred by Dr Redmond Baseman for chronic cough X8 mos.  Pt c/o sometimes prod cough with clear mucous, made worse by eating.    [facility] eval and rx for GERD then referred to Dr Redmond Baseman Only improved on hydromet  Cough only when eat and lie down  ? Better with inhalers, already on dulera 100 2bid no sign purulent sputum or even much volume No sob over baseline rec Stop tribenzor and lopressor Start benicar [id number] and bystolic 10 mg daily  Continue Nexium 40 mg Take 30- 60 min before your first and last meals of the day  Continue dulera 100 Take 2 puffs first thing in am and then another 2 puffs about 12 hours later   GERD  Diet  Please schedule a follow up office visit in 2 weeks, sooner if needed all medication including anything over the counter    06/15/2013 f/u ov/Lynn Carey re: cough since summer 2014/ brought meds in a pill organizer, unlabeled, no saba Chief Complaint  Patient presents with  . Follow-up    Pt reports cough is about 50% better since last visit. No new co's today.   cough all day long but no longer at night and not using cough suppression at all. Constant sensation she needs to clear her throat Not limited by breathing from desired activities   >>Continue nexium Take 30- 60 min before your first and last meals of the day automatically For drainage > use sugarless candy and  chlortrimeton 4 mg every 4 hours as needed (over the counter) Stop dulera for now and just use ventolin every 4 hours as needed   06/23/2013 Follow up and Med review Patient returns for followup and medication review. We reviewed all her medications and organized them into a medication calendar with patient education It appears the patient is taking her medications Last visit. Patient was taken off of Dulera due to cough .  Since last visit. Patient is feeling some better. No flare of cough or wheezing off Dulera .  No increased SABA use. No use for last 1 week.  rec No change rx > follow med calendar   07/27/2013 f/u ov/Lynn Carey re: chronic cough with nl pfts/ no longer on any inhalers at all/ no better or worse    No obvious day to day or daytime variabilty or assoc sob   cp or chest tightness, subjective wheeze overt sinus or hb symptoms. No unusual exp hx or h/o childhood pna/ asthma or knowledge of premature birth.  Sleeping ok without nocturnal  or early am exacerbation  of respiratory  c/o's or need for noct saba. Also denies any obvious fluctuation of symptoms with weather or environmental changes or other aggravating or alleviating factors except as outlined above   Current Medications, Allergies, Complete Past Medical History, Past Surgical History, Family History, and Social History were  reviewed in [place] record.  ROS  The following are not active complaints unless bolded sore throat, dysphagia, dental problems, itching, sneezing,  nasal congestion or excess/ purulent secretions, ear ache,   fever, chills, sweats, unintended wt loss, pleuritic or exertional cp, hemoptysis,  orthopnea pnd or leg swelling, presyncope, palpitations, heartburn, abdominal pain, anorexia, nausea, vomiting, diarrhea  or change in bowel or urinary habits, change in stools or urine, dysuria,hematuria,  rash, arthralgias, visual complaints, headache, numbness weakness or ataxia or  problems with walking or coordination,  change in mood/affect or memory.                           Objective:   Physical Exam   06/15/2013        165  >06/23/2013 >163 06/23/2013 > 07/27/2013  157     amb bf nad barking cough / prominent pseudowheeze  HEENT: nl dentition, turbinates, and orophanx which is pristine. Nl external ear canals without cough reflex   NECK :  without JVD/Nodes/TM/ nl carotid upstrokes bilaterally   LUNGS: no acc muscle use, clear to A and P bilaterally without cough on insp or exp maneuvers   CV:  RRR  no s3 or murmur or increase in P2, no edema   ABD:  soft and nontender with nl excursion in the supine position. No bruits or organomegaly, bowel sounds nl  MS:  warm without deformities, calf tenderness, cyanosis or clubbing  SKIN: warm and dry without lesions         02/21/2013  1. No evidence of Zenker's diverticulum.  2. Nonspecific esophageal motility disorder (mild).  3. Small sliding-type hiatal hernia.  4. Mild gastroesophageal reflux identified during the water siphon  test.   cxr 05/26/13 No acute cardiopulmonary abnormality seen.        Assessment & Plan:

## 2013-07-28 ENCOUNTER — Encounter: Payer: Self-pay | Admitting: Internal Medicine

## 2013-07-28 DIAGNOSIS — E119 Type 2 diabetes mellitus without complications: Secondary | ICD-10-CM | POA: Diagnosis not present

## 2013-07-28 DIAGNOSIS — I2510 Atherosclerotic heart disease of native coronary artery without angina pectoris: Secondary | ICD-10-CM | POA: Diagnosis not present

## 2013-07-28 DIAGNOSIS — I5043 Acute on chronic combined systolic (congestive) and diastolic (congestive) heart failure: Secondary | ICD-10-CM | POA: Diagnosis not present

## 2013-07-28 DIAGNOSIS — I10 Essential (primary) hypertension: Secondary | ICD-10-CM | POA: Diagnosis not present

## 2013-07-28 NOTE — Assessment & Plan Note (Signed)
-   05/27/2013 p extensive coaching HFA effectiveness =    75% - trial off dulera [id number] due to cyclical upper airway coughing > no change  - PFTs 07/27/13 wnl   Very little evidence to support asthma here, no asthma med needed at this point

## 2013-07-28 NOTE — Assessment & Plan Note (Addendum)
Classic Upper airway cough syndrome, so named because it's frequently impossible to sort out how much is  CR/sinusitis with freq throat clearing (which can be related to primary GERD)   vs  causing  secondary (" extra esophageal")  GERD from wide swings in gastric pressure that occur with throat clearing, often  promoting self use of mint and menthol lozenges that reduce the lower esophageal sphincter tone and exacerbate the problem further in a cyclical fashion.   These are the same pts (now being labeled as having "irritable larynx syndrome" by some cough centers) who not infrequently have a history of having failed to tolerate ace inhibitors,  dry powder inhalers or biphosphonates or report having atypical reflux symptoms that don't respond to standard doses of PPI , and are easily confused as having aecopd or asthma flares by even experienced allergists/ pulmonologists.   Since already on max gerd and H1 antihistamine rx next step is neurontin trial then regroup in 2 weeks - in meantime has plenty of prns on action part of her med calendar to address the cough  See instructions for specific recommendations which were reviewed directly with the patient who was given a copy with highlighter outlining the key components.

## 2013-07-28 NOTE — Assessment & Plan Note (Signed)
Has been off bystolic for sev days after ran out of samples > not clear what she really needs as did not tell Dr Nadyne Coombes this when he adjusted meds     Each maintenance medication was reviewed in detail including most importantly the difference between maintenance and as needed and under what circumstances the prns are to be used. This was done in the context of a medication calendar review which provided the patient with a user-friendly unambiguous mechanism for medication administration and reconciliation and provides an action plan for all active problems. It is critical that this be shown to every doctor  for modification during the office visit if necessary so the patient can use it as a working document.    This will only work if the real time meds match up to the calendar, however.  Will start back on bystolic 10 mg daily samples for 2 weeks then regroup

## 2013-07-29 LAB — PULMONARY FUNCTION TEST
DL/VA % pred: 112 %
DL/VA: 4.49 ml/min/mmHg/L
DLCO unc % pred: 85 %
DLCO unc: 14.48 ml/min/mmHg
FEF 25-75 Post: 1.97 L/sec
FEF 25-75 Pre: 1.36 L/sec
FEF2575-%Change-Post: 44 %
FEF2575-%Pred-Post: 161 %
FEF2575-%Pred-Pre: 111 %
FEV1-%Change-Post: 9 %
FEV1-%Pred-Post: 123 %
FEV1-%Pred-Pre: 112 %
FEV1-Post: 1.59 L
FEV1-Pre: 1.45 L
FEV1FVC-%Change-Post: 6 %
FEV1FVC-%Pred-Pre: 103 %
FEV6-%Change-Post: 3 %
FEV6-%Pred-Post: 117 %
FEV6-%Pred-Pre: 113 %
FEV6-Post: 1.88 L
FEV6-Pre: 1.82 L
FEV6FVC-%Pred-Post: 105 %
FEV6FVC-%Pred-Pre: 105 %
FVC-%Change-Post: 3 %
FVC-%Pred-Post: 110 %
FVC-%Pred-Pre: 107 %
FVC-Post: 1.88 L
FVC-Pre: 1.82 L
Post FEV1/FVC ratio: 85 %
Post FEV6/FVC ratio: 100 %
Pre FEV1/FVC ratio: 80 %
Pre FEV6/FVC Ratio: 100 %
RV % pred: 71 %
RV: 1.4 L
TLC % pred: 83 %
TLC: 3.53 L

## 2013-08-08 DIAGNOSIS — H402290 Chronic angle-closure glaucoma, unspecified eye, stage unspecified: Secondary | ICD-10-CM | POA: Diagnosis not present

## 2013-08-09 ENCOUNTER — Ambulatory Visit (INDEPENDENT_AMBULATORY_CARE_PROVIDER_SITE_OTHER): Payer: Commercial Managed Care - PPO | Admitting: Internal Medicine

## 2013-08-09 ENCOUNTER — Encounter: Payer: Self-pay | Admitting: Internal Medicine

## 2013-08-09 VITALS — BP 80/44 | HR 71 | Temp 100.3°F | Ht 59.5 in | Wt 150.0 lb

## 2013-08-09 DIAGNOSIS — N183 Chronic kidney disease, stage 3 unspecified: Secondary | ICD-10-CM | POA: Diagnosis not present

## 2013-08-09 DIAGNOSIS — J45909 Unspecified asthma, uncomplicated: Secondary | ICD-10-CM

## 2013-08-09 DIAGNOSIS — R05 Cough: Secondary | ICD-10-CM

## 2013-08-09 DIAGNOSIS — N058 Unspecified nephritic syndrome with other morphologic changes: Secondary | ICD-10-CM | POA: Diagnosis not present

## 2013-08-09 DIAGNOSIS — I10 Essential (primary) hypertension: Secondary | ICD-10-CM

## 2013-08-09 DIAGNOSIS — E1129 Type 2 diabetes mellitus with other diabetic kidney complication: Secondary | ICD-10-CM | POA: Diagnosis not present

## 2013-08-09 DIAGNOSIS — I2510 Atherosclerotic heart disease of native coronary artery without angina pectoris: Secondary | ICD-10-CM | POA: Diagnosis not present

## 2013-08-09 DIAGNOSIS — R059 Cough, unspecified: Secondary | ICD-10-CM

## 2013-08-09 NOTE — Patient Instructions (Addendum)
The only change I recommend is to reduce the benicar to one half daily   See calendar for specific medication instructions and bring it back for each and every office visit for every healthcare provider you see.  Without it,  you may not receive the best quality medical care that we feel you deserve.  You will note that the calendar groups together  your maintenance  medications that are timed at particular times of the day.  Think of this as your checklist for what your doctor has instructed you to do until your next evaluation to see what benefit  there is  to staying on a consistent group of medications intended to keep you well.  The other group at the bottom is entirely up to you to use as you see fit  for specific symptoms that may arise between visits that require you to treat them on an as needed basis.  Think of this as your action plan or "what if" list.   Separating the top medications from the bottom group is fundamental to providing you adequate care going forward.    Pulmonary follow up is as needed if cough worsens on your present plan

## 2013-08-09 NOTE — Progress Notes (Signed)
Subjective:    Patient ID: Lynn Carey, female    DOB: 1940/05/31   MRN: [id number]  Brief patient profile:  89 yobf with onset asthma attacks in the 1980's and quit smoking at that point did not need medications until mid 90's then needing medications for breathing per Dr  Kelton Pillar then onset of cough and worse breathing started summer 2014 under Bryon Lions > referred 05/26/13 to pulmonary clinic for cough with nl lung function documented 07/27/2013    History of Present Illness  05/26/2013 1st [facility] office visit/ Jusiah Aguayo  Chief Complaint  Patient presents with  . Advice Only    Referred by Dr Redmond Baseman for chronic cough X8 mos.  Pt c/o sometimes prod cough with clear mucous, made worse by eating.    [facility] eval and rx for GERD then referred to Dr Redmond Baseman Only improved on hydromet  Cough only when eat and lie down  ? Better with inhalers, already on dulera 100 2bid no sign purulent sputum or even much volume No sob over baseline rec Stop tribenzor and lopressor Start benicar [id number] and bystolic 10 mg daily  Continue Nexium 40 mg Take 30- 60 min before your first and last meals of the day  Continue dulera 100 Take 2 puffs first thing in am and then another 2 puffs about 12 hours later   GERD  Diet  Please schedule a follow up office visit in 2 weeks, sooner if needed all medication including anything over the counter    06/15/2013 f/u ov/Iori Gigante re: cough since summer 2014/ brought meds in a pill organizer, unlabeled, no saba Chief Complaint  Patient presents with  . Follow-up    Pt reports cough is about 50% better since last visit. No new co's today.   cough all day long but no longer at night and not using cough suppression at all. Constant sensation she needs to clear her throat Not limited by breathing from desired activities   >>Continue nexium Take 30- 60 min before your first and last meals of the day automatically For drainage > use sugarless candy and  chlortrimeton 4 mg every 4 hours as needed (over the counter) Stop dulera for now and just use ventolin every 4 hours as needed   06/23/2013 Follow up and Med review Patient returns for followup and medication review. We reviewed all her medications and organized them into a medication calendar with patient education It appears the patient is taking her medications Last visit. Patient was taken off of Dulera due to cough .  Since last visit. Patient is feeling some better. No flare of cough or wheezing off Dulera .  No increased SABA use. No use for last 1 week.  rec No change rx > follow med calendar   07/27/2013 f/u ov/Shanequia Kendrick re: chronic cough with nl pfts/ no longer on any inhalers at all/ no better or worse rec depomedrol 120 mg today Completely eliminate all coughing x 3 straight days  Gabapentin 100 mg three times daily  Please schedule a follow up office visit in 2 weeks, sooner if needed  Will start back on bystolic 10 mg daily samples for 2 weeks then regroup    08/09/2013 f/u ov/Chaylee Ehrsam re: pseudoasthma / now on neurontin 100 tid  Chief Complaint  Patient presents with  . Follow-up    Pt states cough is better compared to last visit. She is using her rescue inhaler at least once per day.    Not using  med calendar well, not updated by various providers at time of ov so it's not correct anyway    No obvious day to day or daytime variabilty or assoc sob   cp or chest tightness, subjective wheeze overt sinus or hb symptoms. No unusual exp hx or h/o childhood pna/ asthma or knowledge of premature birth.  Sleeping ok without nocturnal  or early am exacerbation  of respiratory  c/o's or need for noct saba. Also denies any obvious fluctuation of symptoms with weather or environmental changes or other aggravating or alleviating factors except as outlined above   Current Medications, Allergies, Complete Past Medical History, Past Surgical History, Family History, and Social History were  reviewed in [employer] record.  ROS  The following are not active complaints unless bolded sore throat, dysphagia, dental problems, itching, sneezing,  nasal congestion or excess/ purulent secretions, ear ache,   fever, chills, sweats, unintended wt loss, pleuritic or exertional cp, hemoptysis,  orthopnea pnd or leg swelling, presyncope, palpitations, heartburn, abdominal pain, anorexia, nausea, vomiting, diarrhea  or change in bowel or urinary habits, change in stools or urine, dysuria,hematuria,  rash, arthralgias, visual complaints, headache, numbness weakness or ataxia or problems with walking or coordination,  change in mood/affect or memory.                           Objective:   Physical Exam   06/15/2013  165  >06/23/2013 >163 06/23/2013 > 07/27/2013  157 > 08/09/13 150    amb bf nad barking cough /   pseudowheeze resolved  HEENT: nl dentition, turbinates, and orophanx which is pristine. Nl external ear canals without cough reflex   NECK :  without JVD/Nodes/TM/ nl carotid upstrokes bilaterally   LUNGS: no acc muscle use, clear to A and P bilaterally without cough on insp or exp maneuvers   CV:  RRR  no s3 or murmur or increase in P2, no edema   ABD:  soft and nontender with nl excursion in the supine position. No bruits or organomegaly, bowel sounds nl  MS:  warm without deformities, calf tenderness, cyanosis or clubbing  SKIN: warm and dry without lesions         02/21/2013  1. No evidence of Zenker's diverticulum.  2. Nonspecific esophageal motility disorder (mild).  3. Small sliding-type hiatal hernia.  4. Mild gastroesophageal reflux identified during the water siphon  test.   cxr 05/26/13 No acute cardiopulmonary abnormality seen.        Assessment & Plan:   Outpatient Encounter Prescriptions as of 08/09/2013  Medication Sig  . acetaminophen (TYLENOL ARTHRITIS PAIN) 650 MG CR tablet 2 tabs by mouth every 8 hours as needed  for pain  . albuterol (PROVENTIL HFA;VENTOLIN HFA) 108 (90 BASE) MCG/ACT inhaler Inhale 2 puffs into the lungs every 4 (four) hours as needed for shortness of breath.   Marland Kitchen aspirin 81 MG tablet 2 tabs by mouth once daily  . chlorpheniramine (CHLOR-TRIMETON) 4 MG tablet Take 4 mg by mouth every 4 (four) hours as needed (drippy nose, drainage, throat clearing).  . cholecalciferol (VITAMIN D) 1000 UNITS tablet Take 2,000 Units by mouth daily.   Marland Kitchen dextromethorphan (DELSYM) 30 MG/5ML liquid 2 tsp every 12 hours as needed for cough  . esomeprazole (NEXIUM) 40 MG capsule Take 40 mg by mouth 2 (two) times daily.  Marland Kitchen ezetimibe (ZETIA) 10 MG tablet Take 10 mg by mouth at bedtime.   Marland Kitchen  gabapentin (NEURONTIN) 100 MG capsule Take 1 capsule (100 mg total) by mouth 3 (three) times daily. One three times daily  . HYDROcodone-acetaminophen (NORCO/VICODIN) 5-325 MG per tablet Take 1-2 tablets by mouth every 4 (four) hours as needed.  Marland Kitchen ibuprofen (ADVIL,MOTRIN) 200 MG tablet Per bottle as needed for joint pain  . isosorbide-hydrALAZINE (BIDIL) 20-37.5 MG per tablet Take 1 tablet by mouth 3 (three) times daily.  . nebivolol (BYSTOLIC) 10 MG tablet Take 1 tablet (10 mg total) by mouth daily.  . nitroGLYCERIN (NITROSTAT) 0.4 MG SL tablet Place 0.4 mg under the tongue every 5 (five) minutes as needed. For chest pain    . olmesartan-hydrochlorothiazide (BENICAR HCT) 40-25 MG per tablet Take 1 tablet by mouth daily.  . ranolazine (RANEXA) 500 MG 12 hr tablet Take 500 mg by mouth 2 (two) times daily.  . rosuvastatin (CRESTOR) 20 MG tablet Take 20 mg by mouth at bedtime.   . sitaGLIPtin (JANUVIA) 100 MG tablet Take 100 mg by mouth daily.

## 2013-08-10 NOTE — Assessment & Plan Note (Signed)
Med Calendar 06/23/2013  - trial of neurontin 07/28/2013 > clearly  improved 08/09/13 so continue indefinitely at 100 mg tid     Each maintenance medication was reviewed in detail including most importantly the difference between maintenance and as needed and under what circumstances the prns are to be used. This was done in the context of a medication calendar review which provided the patient with a user-friendly unambiguous mechanism for medication administration and reconciliation and provides an action plan for all active problems. It is critical that this be shown to every doctor  for modification during the office visit if necessary so the patient can use it as a working document.

## 2013-08-10 NOTE — Assessment & Plan Note (Signed)
Over treated at present so reduce benicar to one half daily

## 2013-08-10 NOTE — Assessment & Plan Note (Signed)
05/27/2013 p extensive coaching HFA effectiveness =    75% - trial off dulera [id number] due to cyclical upper airway coughing > no change  - PFTs 07/27/13 wnl   Still using too much hfa but doubt this is true asthma anyway and so ok to continue prn saba

## 2013-08-18 DIAGNOSIS — I2510 Atherosclerotic heart disease of native coronary artery without angina pectoris: Secondary | ICD-10-CM | POA: Diagnosis not present

## 2013-08-18 DIAGNOSIS — I5043 Acute on chronic combined systolic (congestive) and diastolic (congestive) heart failure: Secondary | ICD-10-CM | POA: Diagnosis not present

## 2013-08-18 DIAGNOSIS — E119 Type 2 diabetes mellitus without complications: Secondary | ICD-10-CM | POA: Diagnosis not present

## 2013-08-24 ENCOUNTER — Emergency Department (HOSPITAL_COMMUNITY)
Admission: EM | Admit: 2013-08-24 | Discharge: 2013-08-24 | Disposition: A | Discharge status: Home / Self Care | Payer: Commercial Managed Care - PPO | Attending: Emergency Medicine | Admitting: Emergency Medicine

## 2013-08-24 ENCOUNTER — Encounter (HOSPITAL_COMMUNITY): Payer: Self-pay | Admitting: Emergency Medicine

## 2013-08-24 ENCOUNTER — Emergency Department (HOSPITAL_COMMUNITY): Payer: Commercial Managed Care - PPO

## 2013-08-24 DIAGNOSIS — Z951 Presence of aortocoronary bypass graft: Secondary | ICD-10-CM | POA: Insufficient documentation

## 2013-08-24 DIAGNOSIS — Z87891 Personal history of nicotine dependence: Secondary | ICD-10-CM | POA: Insufficient documentation

## 2013-08-24 DIAGNOSIS — K219 Gastro-esophageal reflux disease without esophagitis: Secondary | ICD-10-CM | POA: Diagnosis not present

## 2013-08-24 DIAGNOSIS — J45909 Unspecified asthma, uncomplicated: Secondary | ICD-10-CM | POA: Diagnosis not present

## 2013-08-24 DIAGNOSIS — I2510 Atherosclerotic heart disease of native coronary artery without angina pectoris: Secondary | ICD-10-CM | POA: Insufficient documentation

## 2013-08-24 DIAGNOSIS — IMO0002 Reserved for concepts with insufficient information to code with codable children: Secondary | ICD-10-CM | POA: Diagnosis present

## 2013-08-24 DIAGNOSIS — M169 Osteoarthritis of hip, unspecified: Secondary | ICD-10-CM | POA: Diagnosis not present

## 2013-08-24 DIAGNOSIS — I252 Old myocardial infarction: Secondary | ICD-10-CM | POA: Diagnosis not present

## 2013-08-24 DIAGNOSIS — Z9889 Other specified postprocedural states: Secondary | ICD-10-CM | POA: Diagnosis not present

## 2013-08-24 DIAGNOSIS — I209 Angina pectoris, unspecified: Secondary | ICD-10-CM | POA: Insufficient documentation

## 2013-08-24 DIAGNOSIS — I10 Essential (primary) hypertension: Secondary | ICD-10-CM | POA: Insufficient documentation

## 2013-08-24 DIAGNOSIS — Z79899 Other long term (current) drug therapy: Secondary | ICD-10-CM | POA: Diagnosis not present

## 2013-08-24 DIAGNOSIS — Y92410 Unspecified street and highway as the place of occurrence of the external cause: Secondary | ICD-10-CM | POA: Insufficient documentation

## 2013-08-24 DIAGNOSIS — E119 Type 2 diabetes mellitus without complications: Secondary | ICD-10-CM | POA: Diagnosis not present

## 2013-08-24 DIAGNOSIS — T07XXXA Unspecified multiple injuries, initial encounter: Secondary | ICD-10-CM | POA: Diagnosis not present

## 2013-08-24 DIAGNOSIS — Y9389 Activity, other specified: Secondary | ICD-10-CM | POA: Diagnosis not present

## 2013-08-24 DIAGNOSIS — Z7982 Long term (current) use of aspirin: Secondary | ICD-10-CM | POA: Insufficient documentation

## 2013-08-24 DIAGNOSIS — R6889 Other general symptoms and signs: Secondary | ICD-10-CM | POA: Diagnosis not present

## 2013-08-24 DIAGNOSIS — M25559 Pain in unspecified hip: Secondary | ICD-10-CM | POA: Diagnosis not present

## 2013-08-24 DIAGNOSIS — Z96649 Presence of unspecified artificial hip joint: Secondary | ICD-10-CM | POA: Insufficient documentation

## 2013-08-24 DIAGNOSIS — M1610 Unilateral primary osteoarthritis, unspecified hip: Secondary | ICD-10-CM | POA: Insufficient documentation

## 2013-08-24 MED ORDER — HYDROCODONE-ACETAMINOPHEN 5-325 MG PO TABS: 1.0000 | ORAL_TABLET | Freq: Four times a day (QID) | ORAL | Status: DC | PRN

## 2013-08-24 NOTE — ED Notes (Signed)
PA Gertie Fey advised pt ok to take medications from home.  Pt give Applesauce and water.  Family at bedside.
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
## 2013-09-06 DIAGNOSIS — H402290 Chronic angle-closure glaucoma, unspecified eye, stage unspecified: Secondary | ICD-10-CM | POA: Diagnosis not present

## 2013-09-06 DIAGNOSIS — H2510 Age-related nuclear cataract, unspecified eye: Secondary | ICD-10-CM | POA: Diagnosis not present

## 2013-11-09 DIAGNOSIS — N182 Chronic kidney disease, stage 2 (mild): Secondary | ICD-10-CM | POA: Diagnosis not present

## 2013-11-09 DIAGNOSIS — I1310 Hypertensive heart and chronic kidney disease without heart failure, with stage 1 through stage 4 chronic kidney disease, or unspecified chronic kidney disease: Secondary | ICD-10-CM | POA: Diagnosis not present

## 2013-11-09 DIAGNOSIS — I2510 Atherosclerotic heart disease of native coronary artery without angina pectoris: Secondary | ICD-10-CM | POA: Diagnosis not present

## 2013-11-09 DIAGNOSIS — E1129 Type 2 diabetes mellitus with other diabetic kidney complication: Secondary | ICD-10-CM | POA: Diagnosis not present

## 2013-11-23 DIAGNOSIS — I6529 Occlusion and stenosis of unspecified carotid artery: Secondary | ICD-10-CM | POA: Diagnosis not present

## 2013-11-23 DIAGNOSIS — I2510 Atherosclerotic heart disease of native coronary artery without angina pectoris: Secondary | ICD-10-CM | POA: Diagnosis not present

## 2013-11-23 DIAGNOSIS — E119 Type 2 diabetes mellitus without complications: Secondary | ICD-10-CM | POA: Diagnosis not present

## 2013-11-23 DIAGNOSIS — I5042 Chronic combined systolic (congestive) and diastolic (congestive) heart failure: Secondary | ICD-10-CM | POA: Diagnosis not present

## 2013-11-27 DIAGNOSIS — M25559 Pain in unspecified hip: Secondary | ICD-10-CM | POA: Diagnosis not present

## 2013-12-11 ENCOUNTER — Other Ambulatory Visit: Payer: Self-pay | Admitting: Internal Medicine

## 2013-12-14 ENCOUNTER — Other Ambulatory Visit: Payer: Self-pay | Admitting: Orthopaedic Surgery

## 2014-01-01 DIAGNOSIS — H2510 Age-related nuclear cataract, unspecified eye: Secondary | ICD-10-CM | POA: Diagnosis not present

## 2014-01-01 DIAGNOSIS — H3589 Other specified retinal disorders: Secondary | ICD-10-CM | POA: Diagnosis not present

## 2014-01-01 DIAGNOSIS — H04129 Dry eye syndrome of unspecified lacrimal gland: Secondary | ICD-10-CM | POA: Diagnosis not present

## 2014-01-01 DIAGNOSIS — H402290 Chronic angle-closure glaucoma, unspecified eye, stage unspecified: Secondary | ICD-10-CM | POA: Diagnosis not present

## 2014-01-19 ENCOUNTER — Encounter (HOSPITAL_COMMUNITY): Payer: Self-pay

## 2014-01-19 NOTE — Pre-Procedure Instructions (Signed)
Lynn Carey  01/19/2014   Your procedure is scheduled on:  Tuesday, Oct. 27th   Report to [facility] Admitting at 10:30 AM.   Call this number if you have problems the morning of surgery: [phone]   Remember:   Do not eat food or drink liquids after midnight Monday.   Take these medicines the morning of surgery with A SIP OF WATER: Nexium, Gabapentin, Bidil.  Please use your inhaler   Do not wear jewelry, make-up or nail polish.  Do not wear lotions, powders, or perfumes. You may NOT wear deodorant the morning of surgery.   Do not shave underarms & legs 48 hours prior to surgery.   Do not bring valuables to the hospital.  [facility] is not responsible for any belongings or valuables.               Contacts, dentures or bridgework may not be worn into surgery.  Leave suitcase in the car. After surgery it may be brought to your room.  For patients admitted to the hospital, discharge time is determined by your treatment team.                 Name and phone number of your driver:    Special Instructions: "Preparing for Surgery" instruction sheet.   Please read over the following fact sheets that you were given: Pain Booklet, Coughing and Deep Breathing, Blood Transfusion Information, MRSA Information and Surgical Site Infection Prevention

## 2014-01-22 ENCOUNTER — Encounter (HOSPITAL_COMMUNITY): Payer: Self-pay

## 2014-01-22 ENCOUNTER — Encounter (HOSPITAL_COMMUNITY)
Admission: RE | Admit: 2014-01-22 | Discharge: 2014-01-22 | Disposition: A | Discharge status: Home / Self Care | Payer: Commercial Managed Care - PPO | Source: Ambulatory Visit | Attending: Orthopaedic Surgery | Admitting: Orthopaedic Surgery

## 2014-01-22 DIAGNOSIS — K219 Gastro-esophageal reflux disease without esophagitis: Secondary | ICD-10-CM | POA: Insufficient documentation

## 2014-01-22 DIAGNOSIS — I509 Heart failure, unspecified: Secondary | ICD-10-CM | POA: Diagnosis not present

## 2014-01-22 DIAGNOSIS — I2510 Atherosclerotic heart disease of native coronary artery without angina pectoris: Secondary | ICD-10-CM | POA: Insufficient documentation

## 2014-01-22 DIAGNOSIS — M169 Osteoarthritis of hip, unspecified: Secondary | ICD-10-CM | POA: Diagnosis not present

## 2014-01-22 DIAGNOSIS — Z951 Presence of aortocoronary bypass graft: Secondary | ICD-10-CM | POA: Diagnosis not present

## 2014-01-22 DIAGNOSIS — Z01818 Encounter for other preprocedural examination: Secondary | ICD-10-CM | POA: Insufficient documentation

## 2014-01-22 DIAGNOSIS — I10 Essential (primary) hypertension: Secondary | ICD-10-CM | POA: Insufficient documentation

## 2014-01-22 HISTORY — DX: Heart failure, unspecified: I50.9

## 2014-01-22 LAB — URINALYSIS, ROUTINE W REFLEX MICROSCOPIC
Glucose, UA: NEGATIVE mg/dL
Hgb urine dipstick: NEGATIVE
Ketones, ur: 15 mg/dL — AB
Leukocytes, UA: NEGATIVE
Nitrite: NEGATIVE
Protein, ur: NEGATIVE mg/dL
Specific Gravity, Urine: 1.021 (ref 1.005–1.030)
Urobilinogen, UA: 1 mg/dL (ref 0.0–1.0)
pH: 5.5 (ref 5.0–8.0)

## 2014-01-22 LAB — BASIC METABOLIC PANEL
Anion gap: 14 (ref 5–15)
BUN: 16 mg/dL (ref 6–23)
CO2: 25 mEq/L (ref 19–32)
Calcium: 9.8 mg/dL (ref 8.4–10.5)
Chloride: 102 mEq/L (ref 96–112)
Creatinine, Ser: 1.01 mg/dL (ref 0.50–1.10)
GFR calc Af Amer: 62 mL/min — ABNORMAL LOW (ref >90)
GFR calc non Af Amer: 54 mL/min — ABNORMAL LOW (ref >90)
Glucose, Bld: 142 mg/dL — ABNORMAL HIGH (ref 70–99)
Potassium: 3.8 mEq/L (ref 3.7–5.3)
Sodium: 141 mEq/L (ref 137–147)

## 2014-01-22 LAB — CBC WITH DIFFERENTIAL/PLATELET
Basophils Absolute: 0 10*3/uL (ref 0.0–0.1)
Basophils Relative: 0 % (ref 0–1)
Eosinophils Absolute: 0.2 10*3/uL (ref 0.0–0.7)
Eosinophils Relative: 3 % (ref 0–5)
HCT: 35.9 % — ABNORMAL LOW (ref 36.0–46.0)
Hemoglobin: 11.5 g/dL — ABNORMAL LOW (ref 12.0–15.0)
Lymphocytes Relative: 28 % (ref 12–46)
Lymphs Abs: 1.9 10*3/uL (ref 0.7–4.0)
MCH: 28.3 pg (ref 26.0–34.0)
MCHC: 32 g/dL (ref 30.0–36.0)
MCV: 88.2 fL (ref 78.0–100.0)
Monocytes Absolute: 0.3 10*3/uL (ref 0.1–1.0)
Monocytes Relative: 5 % (ref 3–12)
Neutro Abs: 4.4 10*3/uL (ref 1.7–7.7)
Neutrophils Relative %: 64 % (ref 43–77)
Platelets: 373 10*3/uL (ref 150–400)
RBC: 4.07 MIL/uL (ref 3.87–5.11)
RDW: 13.6 % (ref 11.5–15.5)
WBC: 6.8 10*3/uL (ref 4.0–10.5)

## 2014-01-22 LAB — APTT: aPTT: 29 seconds (ref 24–37)

## 2014-01-22 LAB — PROTIME-INR
INR: 1.12 (ref 0.00–1.49)
Prothrombin Time: 14.5 seconds (ref 11.6–15.2)

## 2014-01-22 LAB — SURGICAL PCR SCREEN
MRSA, PCR: NEGATIVE
Staphylococcus aureus: NEGATIVE

## 2014-01-22 NOTE — Progress Notes (Addendum)
Has had heart issues in the past and had seen Dr. Linard Millers.  She then had to switch PCP's, and was then directed to Dr. Einar Gip as her cardio.  Back in 2004 & 2207 (she believes), she's had 2 stents. Denies any chest discomfort for a couple of months now.   Her sleep study was neg and that was back in 2009.   Dr Minna Merritts Dr. Johnnye Lana, PCP  Received Echo, old ekg, bil carotid studies, LOV   DA

## 2014-01-22 NOTE — Progress Notes (Addendum)
Anesthesia Chart Review:  Patient is a 73 year old female scheduled for right THA, anterior on 01/30/14 by Dr. Rhona Raider.  History includes former smoker, CAD/MI s/p CABG '97 [facility]) with LM stent '04 and unsuccessful PCI to CX and RI 05/12/11, chronic angina (on Ranexa), chronic CHF (EF 31% 07/2013), post-operative N/V, asthma ("very little evidence to support asthma" diagnosis, 07/28/13, Dr. Melvyn Novas), SOB, DM2, GERD, arthritis, HTN, left THA 12/2012. BMI is consistent with obesity.  PCP is Dr. Glendale Chard. Pulmonologist is Dr. Christinia Gully, last visit 08/09/13. Cardiologist is Dr. Einar Gip [facility]), last office visit 11/23/13 with follow-up echo in two months recommended to determine if she will need referral to EP cardiology for consideration of an ICD.    Meds: Chlor-Trimeton, Vitamin D, Tribenzor, Tylenol, albuterol, ASA 81mg , Nexium, Neurontin, Bidil, Xalatan, Nitro, Ranexa, Crestor, Januvia.        EKG on 01/22/14 showed: NSR, cannot rule out anterior infarct (age undetermined), ST/T wave abnormality, consider inferolateral ischemia.  Overall, I think her EKG is stable when compared to tracing from 06/26/13 [facility]).  Echo on 07/13/13 [facility]): Left ventricular cavity is normal in size. Mild concentric hypertrophy. Moderate global hypokinesis. Moderately decreased systolic global function. Calculated EF 31%. Visual EF is 35-40%. Doppler evidence of grade one (impaired) diastolic dysfunction. Left atrial cavity is mild to moderately dilated. Mild aortic valve leaflet thickening. Moderate mitral regurgitation. Mild tricuspid regurgitation.  Cardiac cath on 04/22/11 showed:  1. Moderate decrease in left ventricular systolic function, ejection fraction is 45% with global hypokinesis.  2. Patent saphenous vein graft to right coronary artery with occluded native right coronary artery.  3. Patent left internal mammary artery to left anterior descending with occluded native left anterior descending.   4. Distal left main stent placed in 2004 is patent, however, proximal to the stent and distal to the stent, there is a high-grade stenosis. Proximal to the stent was a 50% stenosis and distal to the stent involving the ostial circumflex, ostial ramus intermediate, and a very small secondary branch of the ramus intermediate have a high-grade 99% stenosis. (Followed by unsuccessful attempt at PCI to CX and RI on 05/12/11.)  Carotid duplex on 01/16/14 showed: Moderate stenosis of the right mid ICA and proximal ICA, 50-69%. Mild stenosis in the right distal ICA, 16-49%. Evidence of heterogeneous plaque in the right ECA, > 50%.  Moderate stenosis of the left distal ICA, 50-69%.   CXR on 08/24/13 showed: No active disease. No pneumothorax.  Preoperative labs noted.    Based on Dr. Irven Shelling recommendations from August, she will need preoperative cardiology clearance.  Manuela Schwartz at Dr. Jerald Kief office notified.  [facility] Short Stay Center/Anesthesiology Phone [phone] 01/22/2014 4:24 PM  Addendum:  I spoke with Dr. Einar Gip this morning regarding surgery plans tomorrow.  He said he would review her records and ultimately sent a cardiac clearance with low risk for perioperative CV complication.    [facility] Short Stay Center/Anesthesiology Phone [phone] 01/29/2014 4:42 PM
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
## 2014-01-29 MED ORDER — CEFAZOLIN SODIUM-DEXTROSE 2-3 GM-% IV SOLR
2.0000 g | INTRAVENOUS | Status: AC
  Administered 2014-01-30: 2 g via INTRAVENOUS
  Filled 2014-01-29: qty 50

## 2014-01-29 MED ORDER — LACTATED RINGERS IV SOLN
INTRAVENOUS | Status: DC
  Administered 2014-01-30: 11:00:00 via INTRAVENOUS

## 2014-01-30 ENCOUNTER — Encounter (HOSPITAL_COMMUNITY): Payer: Self-pay | Admitting: *Deleted

## 2014-01-30 ENCOUNTER — Inpatient Hospital Stay (HOSPITAL_COMMUNITY)
Admission: RE | Admit: 2014-01-30 | Discharge: 2014-02-03 | DRG: 470 | Disposition: A | Discharge status: Skilled Nursing Facility | Payer: Commercial Managed Care - PPO | Source: Ambulatory Visit | Attending: Orthopaedic Surgery | Admitting: Orthopaedic Surgery

## 2014-01-30 ENCOUNTER — Inpatient Hospital Stay (HOSPITAL_COMMUNITY): Payer: Commercial Managed Care - PPO

## 2014-01-30 ENCOUNTER — Inpatient Hospital Stay (HOSPITAL_COMMUNITY): Payer: Commercial Managed Care - PPO | Admitting: Anesthesiology

## 2014-01-30 ENCOUNTER — Encounter (HOSPITAL_COMMUNITY)
Admission: RE | Disposition: A | Discharge status: Skilled Nursing Facility | Payer: Self-pay | Source: Ambulatory Visit | Attending: Orthopaedic Surgery

## 2014-01-30 ENCOUNTER — Encounter (HOSPITAL_COMMUNITY): Payer: Commercial Managed Care - PPO | Admitting: Vascular Surgery

## 2014-01-30 DIAGNOSIS — I10 Essential (primary) hypertension: Secondary | ICD-10-CM | POA: Diagnosis present

## 2014-01-30 DIAGNOSIS — E119 Type 2 diabetes mellitus without complications: Secondary | ICD-10-CM

## 2014-01-30 DIAGNOSIS — J45909 Unspecified asthma, uncomplicated: Secondary | ICD-10-CM | POA: Diagnosis present

## 2014-01-30 DIAGNOSIS — D62 Acute posthemorrhagic anemia: Secondary | ICD-10-CM | POA: Diagnosis not present

## 2014-01-30 DIAGNOSIS — I252 Old myocardial infarction: Secondary | ICD-10-CM | POA: Diagnosis not present

## 2014-01-30 DIAGNOSIS — Z23 Encounter for immunization: Secondary | ICD-10-CM | POA: Diagnosis not present

## 2014-01-30 DIAGNOSIS — Z96642 Presence of left artificial hip joint: Secondary | ICD-10-CM | POA: Diagnosis present

## 2014-01-30 DIAGNOSIS — K219 Gastro-esophageal reflux disease without esophagitis: Secondary | ICD-10-CM | POA: Diagnosis present

## 2014-01-30 DIAGNOSIS — I509 Heart failure, unspecified: Secondary | ICD-10-CM | POA: Diagnosis present

## 2014-01-30 DIAGNOSIS — Z951 Presence of aortocoronary bypass graft: Secondary | ICD-10-CM

## 2014-01-30 DIAGNOSIS — E785 Hyperlipidemia, unspecified: Secondary | ICD-10-CM | POA: Diagnosis present

## 2014-01-30 DIAGNOSIS — Z96641 Presence of right artificial hip joint: Secondary | ICD-10-CM | POA: Diagnosis not present

## 2014-01-30 DIAGNOSIS — I2510 Atherosclerotic heart disease of native coronary artery without angina pectoris: Secondary | ICD-10-CM | POA: Diagnosis present

## 2014-01-30 DIAGNOSIS — Z87891 Personal history of nicotine dependence: Secondary | ICD-10-CM

## 2014-01-30 DIAGNOSIS — M1611 Unilateral primary osteoarthritis, right hip: Principal | ICD-10-CM | POA: Diagnosis present

## 2014-01-30 DIAGNOSIS — M169 Osteoarthritis of hip, unspecified: Secondary | ICD-10-CM | POA: Diagnosis present

## 2014-01-30 DIAGNOSIS — R0602 Shortness of breath: Secondary | ICD-10-CM | POA: Diagnosis not present

## 2014-01-30 HISTORY — PX: TOTAL HIP ARTHROPLASTY: SHX124

## 2014-01-30 LAB — GLUCOSE, CAPILLARY
Glucose-Capillary: 107 mg/dL — ABNORMAL HIGH (ref 70–99)
Glucose-Capillary: 118 mg/dL — ABNORMAL HIGH (ref 70–99)
Glucose-Capillary: 154 mg/dL — ABNORMAL HIGH (ref 70–99)

## 2014-01-30 SURGERY — ARTHROPLASTY, HIP, TOTAL, ANTERIOR APPROACH
Anesthesia: Spinal | Site: Hip | Laterality: Right

## 2014-01-30 MED ORDER — SUCCINYLCHOLINE CHLORIDE 20 MG/ML IJ SOLN
INTRAMUSCULAR | Status: AC
  Filled 2014-01-30: qty 1

## 2014-01-30 MED ORDER — ISOSORB DINITRATE-HYDRALAZINE 20-37.5 MG PO TABS
1.0000 | ORAL_TABLET | Freq: Three times a day (TID) | ORAL | Status: DC
  Administered 2014-01-30 – 2014-02-03 (×9): 1 via ORAL
  Filled 2014-01-30 (×13): qty 1

## 2014-01-30 MED ORDER — PANTOPRAZOLE SODIUM 40 MG PO TBEC
80.0000 mg | DELAYED_RELEASE_TABLET | Freq: Every day | ORAL | Status: DC
  Administered 2014-01-31 – 2014-02-03 (×4): 80 mg via ORAL
  Filled 2014-01-30 (×4): qty 2

## 2014-01-30 MED ORDER — ALUM & MAG HYDROXIDE-SIMETH 200-200-20 MG/5ML PO SUSP: 30.0000 mL | ORAL | Status: DC | PRN

## 2014-01-30 MED ORDER — LIDOCAINE HCL (CARDIAC) 20 MG/ML IV SOLN
INTRAVENOUS | Status: AC
  Filled 2014-01-30: qty 20

## 2014-01-30 MED ORDER — ACETAMINOPHEN 325 MG PO TABS: 650.0000 mg | ORAL_TABLET | Freq: Four times a day (QID) | ORAL | Status: DC | PRN

## 2014-01-30 MED ORDER — ALBUTEROL SULFATE (2.5 MG/3ML) 0.083% IN NEBU: 3.0000 mL | INHALATION_SOLUTION | RESPIRATORY_TRACT | Status: DC | PRN

## 2014-01-30 MED ORDER — EZETIMIBE 10 MG PO TABS
10.0000 mg | ORAL_TABLET | Freq: Every day | ORAL | Status: DC
  Administered 2014-01-30 – 2014-02-02 (×4): 10 mg via ORAL
  Filled 2014-01-30 (×5): qty 1

## 2014-01-30 MED ORDER — BUPIVACAINE HCL (PF) 0.75 % IJ SOLN
INTRAMUSCULAR | Status: DC | PRN
Start: 2014-01-30 — End: 2014-01-30
  Administered 2014-01-30: 10.5 mg via INTRATHECAL

## 2014-01-30 MED ORDER — NITROGLYCERIN 0.4 MG SL SUBL
0.4000 mg | SUBLINGUAL_TABLET | SUBLINGUAL | Status: DC | PRN
Start: 2014-01-30 — End: 2014-02-03

## 2014-01-30 MED ORDER — LATANOPROST 0.005 % OP SOLN
1.0000 [drp] | Freq: Every day | OPHTHALMIC | Status: DC
  Administered 2014-01-30 – 2014-02-02 (×3): 1 [drp] via OPHTHALMIC
  Filled 2014-01-30 (×2): qty 2.5

## 2014-01-30 MED ORDER — ONDANSETRON HCL 4 MG PO TABS: 4.0000 mg | ORAL_TABLET | Freq: Four times a day (QID) | ORAL | Status: DC | PRN

## 2014-01-30 MED ORDER — FENTANYL CITRATE 0.05 MG/ML IJ SOLN
INTRAMUSCULAR | Status: DC | PRN
  Administered 2014-01-30: 50 ug via INTRAVENOUS

## 2014-01-30 MED ORDER — PHENOL 1.4 % MT LIQD: 1.0000 | OROMUCOSAL | Status: DC | PRN

## 2014-01-30 MED ORDER — ARTIFICIAL TEARS OP OINT
TOPICAL_OINTMENT | OPHTHALMIC | Status: AC
  Filled 2014-01-30: qty 7

## 2014-01-30 MED ORDER — PROPOFOL INFUSION 10 MG/ML OPTIME
INTRAVENOUS | Status: DC | PRN
  Administered 2014-01-30: 50 ug/kg/min via INTRAVENOUS

## 2014-01-30 MED ORDER — RANOLAZINE ER 500 MG PO TB12
500.0000 mg | ORAL_TABLET | Freq: Two times a day (BID) | ORAL | Status: DC
  Administered 2014-01-30 – 2014-02-03 (×7): 500 mg via ORAL
  Filled 2014-01-30 (×9): qty 1

## 2014-01-30 MED ORDER — METOCLOPRAMIDE HCL 10 MG PO TABS: 5.0000 mg | ORAL_TABLET | Freq: Three times a day (TID) | ORAL | Status: DC | PRN

## 2014-01-30 MED ORDER — 0.9 % SODIUM CHLORIDE (POUR BTL) OPTIME
TOPICAL | Status: DC | PRN
  Administered 2014-01-30: 1000 mL

## 2014-01-30 MED ORDER — MENTHOL 3 MG MT LOZG: 1.0000 | LOZENGE | OROMUCOSAL | Status: DC | PRN

## 2014-01-30 MED ORDER — ONDANSETRON HCL 4 MG/2ML IJ SOLN
INTRAMUSCULAR | Status: DC | PRN
  Administered 2014-01-30: 4 mg via INTRAVENOUS

## 2014-01-30 MED ORDER — LACTATED RINGERS IV SOLN
INTRAVENOUS | Status: DC | PRN
  Administered 2014-01-30 (×2): via INTRAVENOUS

## 2014-01-30 MED ORDER — DOCUSATE SODIUM 100 MG PO CAPS
100.0000 mg | ORAL_CAPSULE | Freq: Two times a day (BID) | ORAL | Status: DC
  Administered 2014-01-30 – 2014-02-03 (×8): 100 mg via ORAL
  Filled 2014-01-30 (×8): qty 1

## 2014-01-30 MED ORDER — HYDROMORPHONE HCL 1 MG/ML IJ SOLN
INTRAMUSCULAR | Status: AC
  Administered 2014-01-30: 0.5 mg via INTRAVENOUS
  Filled 2014-01-30: qty 1

## 2014-01-30 MED ORDER — PHENYLEPHRINE 40 MCG/ML (10ML) SYRINGE FOR IV PUSH (FOR BLOOD PRESSURE SUPPORT)
PREFILLED_SYRINGE | INTRAVENOUS | Status: AC
  Filled 2014-01-30: qty 20

## 2014-01-30 MED ORDER — ROCURONIUM BROMIDE 50 MG/5ML IV SOLN
INTRAVENOUS | Status: AC
  Filled 2014-01-30: qty 1

## 2014-01-30 MED ORDER — METHOCARBAMOL 500 MG PO TABS
500.0000 mg | ORAL_TABLET | Freq: Four times a day (QID) | ORAL | Status: DC | PRN
  Administered 2014-01-30: 500 mg via ORAL
  Filled 2014-01-30: qty 1

## 2014-01-30 MED ORDER — GABAPENTIN 100 MG PO CAPS
100.0000 mg | ORAL_CAPSULE | Freq: Three times a day (TID) | ORAL | Status: DC
  Administered 2014-01-30 – 2014-02-03 (×11): 100 mg via ORAL
  Filled 2014-01-30 (×13): qty 1

## 2014-01-30 MED ORDER — METHOCARBAMOL 500 MG PO TABS
ORAL_TABLET | ORAL | Status: AC
  Administered 2014-01-30: 500 mg via ORAL
  Filled 2014-01-30: qty 1

## 2014-01-30 MED ORDER — ASPIRIN EC 325 MG PO TBEC
325.0000 mg | DELAYED_RELEASE_TABLET | Freq: Two times a day (BID) | ORAL | Status: DC
  Administered 2014-01-31 – 2014-02-03 (×7): 325 mg via ORAL
  Filled 2014-01-30 (×9): qty 1

## 2014-01-30 MED ORDER — METOCLOPRAMIDE HCL 5 MG/ML IJ SOLN: 5.0000 mg | Freq: Three times a day (TID) | INTRAMUSCULAR | Status: DC | PRN

## 2014-01-30 MED ORDER — HYDROCODONE-ACETAMINOPHEN 5-325 MG PO TABS
ORAL_TABLET | ORAL | Status: AC
  Administered 2014-01-30: 2 via ORAL
  Filled 2014-01-30: qty 2

## 2014-01-30 MED ORDER — LACTATED RINGERS IV SOLN: INTRAVENOUS | Status: DC

## 2014-01-30 MED ORDER — NEOSTIGMINE METHYLSULFATE 10 MG/10ML IV SOLN
INTRAVENOUS | Status: AC
  Filled 2014-01-30: qty 1

## 2014-01-30 MED ORDER — PROPOFOL 10 MG/ML IV BOLUS
INTRAVENOUS | Status: AC
  Filled 2014-01-30: qty 20

## 2014-01-30 MED ORDER — OLMESARTAN-AMLODIPINE-HCTZ 40-5-25 MG PO TABS: 1.0000 | ORAL_TABLET | Freq: Every day | ORAL | Status: DC

## 2014-01-30 MED ORDER — PHENYLEPHRINE HCL 10 MG/ML IJ SOLN
INTRAMUSCULAR | Status: DC | PRN
Start: 2014-01-30 — End: 2014-01-30
  Administered 2014-01-30 (×2): 120 ug via INTRAVENOUS
  Administered 2014-01-30: 80 ug via INTRAVENOUS

## 2014-01-30 MED ORDER — BISACODYL 5 MG PO TBEC: 5.0000 mg | DELAYED_RELEASE_TABLET | Freq: Every day | ORAL | Status: DC | PRN

## 2014-01-30 MED ORDER — ROSUVASTATIN CALCIUM 20 MG PO TABS
20.0000 mg | ORAL_TABLET | Freq: Every day | ORAL | Status: DC
  Administered 2014-01-30 – 2014-02-02 (×4): 20 mg via ORAL
  Filled 2014-01-30 (×5): qty 1

## 2014-01-30 MED ORDER — CEFAZOLIN SODIUM-DEXTROSE 2-3 GM-% IV SOLR
2.0000 g | Freq: Four times a day (QID) | INTRAVENOUS | Status: AC
  Administered 2014-01-30 – 2014-01-31 (×2): 2 g via INTRAVENOUS
  Filled 2014-01-30 (×2): qty 50

## 2014-01-30 MED ORDER — IRBESARTAN 300 MG PO TABS
300.0000 mg | ORAL_TABLET | Freq: Every day | ORAL | Status: DC
  Administered 2014-01-31 – 2014-02-03 (×4): 300 mg via ORAL
  Filled 2014-01-30 (×5): qty 1

## 2014-01-30 MED ORDER — AMLODIPINE BESYLATE 5 MG PO TABS
5.0000 mg | ORAL_TABLET | Freq: Every day | ORAL | Status: DC
  Administered 2014-01-31 – 2014-02-03 (×3): 5 mg via ORAL
  Filled 2014-01-30 (×5): qty 1

## 2014-01-30 MED ORDER — HYDROCHLOROTHIAZIDE 25 MG PO TABS
25.0000 mg | ORAL_TABLET | Freq: Every day | ORAL | Status: DC
  Administered 2014-01-31 – 2014-02-03 (×4): 25 mg via ORAL
  Filled 2014-01-30 (×5): qty 1

## 2014-01-30 MED ORDER — MIDAZOLAM HCL 2 MG/2ML IJ SOLN
INTRAMUSCULAR | Status: AC
  Filled 2014-01-30: qty 2

## 2014-01-30 MED ORDER — EPHEDRINE SULFATE 50 MG/ML IJ SOLN
INTRAMUSCULAR | Status: AC
  Filled 2014-01-30: qty 3

## 2014-01-30 MED ORDER — HYDROMORPHONE HCL 1 MG/ML IJ SOLN
0.5000 mg | INTRAMUSCULAR | Status: DC | PRN
  Administered 2014-01-30 – 2014-01-31 (×2): 1 mg via INTRAVENOUS
  Filled 2014-01-30: qty 1

## 2014-01-30 MED ORDER — VITAMIN D 1000 UNITS PO TABS
2000.0000 [IU] | ORAL_TABLET | Freq: Two times a day (BID) | ORAL | Status: DC
  Administered 2014-01-30 – 2014-02-03 (×8): 2000 [IU] via ORAL
  Filled 2014-01-30 (×9): qty 2

## 2014-01-30 MED ORDER — GLYCOPYRROLATE 0.2 MG/ML IJ SOLN
INTRAMUSCULAR | Status: AC
  Filled 2014-01-30: qty 4

## 2014-01-30 MED ORDER — METHOCARBAMOL 1000 MG/10ML IJ SOLN
500.0000 mg | Freq: Four times a day (QID) | INTRAVENOUS | Status: DC | PRN
  Filled 2014-01-30: qty 5

## 2014-01-30 MED ORDER — MIDAZOLAM HCL 5 MG/5ML IJ SOLN
INTRAMUSCULAR | Status: DC | PRN
Start: 2014-01-30 — End: 2014-01-30
  Administered 2014-01-30 (×2): 1 mg via INTRAVENOUS

## 2014-01-30 MED ORDER — DIPHENHYDRAMINE HCL 12.5 MG/5ML PO ELIX: 12.5000 mg | ORAL_SOLUTION | ORAL | Status: DC | PRN

## 2014-01-30 MED ORDER — ONDANSETRON HCL 4 MG/2ML IJ SOLN
4.0000 mg | Freq: Four times a day (QID) | INTRAMUSCULAR | Status: DC | PRN
  Administered 2014-01-31: 4 mg via INTRAVENOUS
  Filled 2014-01-30: qty 2

## 2014-01-30 MED ORDER — HYDROMORPHONE HCL 1 MG/ML IJ SOLN
INTRAMUSCULAR | Status: AC
  Administered 2014-01-30: 1 mg via INTRAVENOUS
  Filled 2014-01-30: qty 1

## 2014-01-30 MED ORDER — INSULIN ASPART 100 UNIT/ML ~~LOC~~ SOLN
0.0000 [IU] | Freq: Three times a day (TID) | SUBCUTANEOUS | Status: DC
  Administered 2014-01-31: 3 [IU] via SUBCUTANEOUS
  Administered 2014-01-31 – 2014-02-01 (×3): 4 [IU] via SUBCUTANEOUS
  Administered 2014-02-01 – 2014-02-03 (×2): 3 [IU] via SUBCUTANEOUS

## 2014-01-30 MED ORDER — PHENYLEPHRINE HCL 10 MG/ML IJ SOLN
INTRAMUSCULAR | Status: AC
  Filled 2014-01-30: qty 1

## 2014-01-30 MED ORDER — CHLORHEXIDINE GLUCONATE 4 % EX LIQD
60.0000 mL | Freq: Once | CUTANEOUS | Status: DC
  Filled 2014-01-30: qty 60

## 2014-01-30 MED ORDER — HYDROCODONE-ACETAMINOPHEN 5-325 MG PO TABS
1.0000 | ORAL_TABLET | ORAL | Status: DC | PRN
  Administered 2014-01-30 – 2014-02-01 (×6): 2 via ORAL
  Administered 2014-02-02 – 2014-02-03 (×2): 1 via ORAL
  Filled 2014-01-30 (×4): qty 2
  Filled 2014-01-30: qty 1
  Filled 2014-01-30: qty 2
  Filled 2014-01-30: qty 1

## 2014-01-30 MED ORDER — TRANEXAMIC ACID 100 MG/ML IV SOLN
2000.0000 mg | INTRAVENOUS | Status: AC
  Administered 2014-01-30: 2000 mg via TOPICAL
  Filled 2014-01-30: qty 20

## 2014-01-30 MED ORDER — SODIUM CHLORIDE 0.9 % IJ SOLN
INTRAMUSCULAR | Status: AC
  Filled 2014-01-30: qty 30

## 2014-01-30 MED ORDER — FENTANYL CITRATE 0.05 MG/ML IJ SOLN
INTRAMUSCULAR | Status: AC
  Filled 2014-01-30: qty 5

## 2014-01-30 MED ORDER — SODIUM CHLORIDE 0.9 % IV SOLN
10.0000 mg | INTRAVENOUS | Status: DC | PRN
  Administered 2014-01-30: 50 ug/min via INTRAVENOUS

## 2014-01-30 MED ORDER — HYDROMORPHONE HCL 1 MG/ML IJ SOLN
0.2500 mg | INTRAMUSCULAR | Status: DC | PRN
  Administered 2014-01-30 (×4): 0.5 mg via INTRAVENOUS

## 2014-01-30 MED ORDER — LINAGLIPTIN 5 MG PO TABS
5.0000 mg | ORAL_TABLET | Freq: Every day | ORAL | Status: DC
  Administered 2014-01-31 – 2014-02-03 (×4): 5 mg via ORAL
  Filled 2014-01-30 (×4): qty 1

## 2014-01-30 MED ORDER — ACETAMINOPHEN 650 MG RE SUPP: 650.0000 mg | Freq: Four times a day (QID) | RECTAL | Status: DC | PRN

## 2014-01-30 SURGICAL SUPPLY — 51 items
APL SKNCLS STERI-STRIP NONHPOA (GAUZE/BANDAGES/DRESSINGS) ×1
BENZOIN TINCTURE PRP APPL 2/3 (GAUZE/BANDAGES/DRESSINGS) ×1 IMPLANT
BLADE SAW SGTL 18X1.27X75 (BLADE) ×2 IMPLANT
CAPT HIP PF MOP ×1 IMPLANT
CELLS DAT CNTRL 66122 CELL SVR (MISCELLANEOUS) ×1 IMPLANT
CLSR STERI-STRIP ANTIMIC 1/2X4 (GAUZE/BANDAGES/DRESSINGS) ×1 IMPLANT
COVER PERINEAL POST (MISCELLANEOUS) ×2 IMPLANT
COVER SURGICAL LIGHT HANDLE (MISCELLANEOUS) ×2 IMPLANT
DRAPE C-ARM 42X72 X-RAY (DRAPES) ×2 IMPLANT
DRAPE STERI IOBAN 125X83 (DRAPES) ×2 IMPLANT
DRAPE U-SHAPE 47X51 STRL (DRAPES) ×6 IMPLANT
DRSG AQUACEL AG ADV 3.5X10 (GAUZE/BANDAGES/DRESSINGS) ×2 IMPLANT
DURAPREP 26ML APPLICATOR (WOUND CARE) ×2 IMPLANT
ELECT BLADE 4.0 EZ CLEAN MEGAD (MISCELLANEOUS)
ELECT CAUTERY BLADE 6.4 (BLADE) ×2 IMPLANT
ELECT REM PT RETURN 9FT ADLT (ELECTROSURGICAL) ×2
ELECTRODE BLDE 4.0 EZ CLN MEGD (MISCELLANEOUS) IMPLANT
ELECTRODE REM PT RTRN 9FT ADLT (ELECTROSURGICAL) ×1 IMPLANT
FACESHIELD WRAPAROUND (MASK) ×4 IMPLANT
FACESHIELD WRAPAROUND OR TEAM (MASK) ×2 IMPLANT
GLOVE BIO SURGEON STRL SZ8 (GLOVE) ×9 IMPLANT
GLOVE BIOGEL PI IND STRL 8 (GLOVE) ×2 IMPLANT
GLOVE BIOGEL PI INDICATOR 8 (GLOVE) ×3
GOWN STRL REUS W/ TWL LRG LVL3 (GOWN DISPOSABLE) ×1 IMPLANT
GOWN STRL REUS W/ TWL XL LVL3 (GOWN DISPOSABLE) ×2 IMPLANT
GOWN STRL REUS W/TWL LRG LVL3 (GOWN DISPOSABLE) ×2
GOWN STRL REUS W/TWL XL LVL3 (GOWN DISPOSABLE) ×4
KIT BASIN OR (CUSTOM PROCEDURE TRAY) ×2 IMPLANT
KIT ROOM TURNOVER OR (KITS) ×2 IMPLANT
MANIFOLD NEPTUNE II (INSTRUMENTS) ×2 IMPLANT
NS IRRIG 1000ML POUR BTL (IV SOLUTION) ×2 IMPLANT
PACK TOTAL JOINT (CUSTOM PROCEDURE TRAY) ×2 IMPLANT
PAD ARMBOARD 7.5X6 YLW CONV (MISCELLANEOUS) ×4 IMPLANT
RETRACTOR WND ALEXIS 18 MED (MISCELLANEOUS) ×1 IMPLANT
RTRCTR WOUND ALEXIS 18CM MED (MISCELLANEOUS) ×2
STAPLER VISISTAT 35W (STAPLE) ×2 IMPLANT
STRIP CLOSURE SKIN 1/2X4 (GAUZE/BANDAGES/DRESSINGS) ×1 IMPLANT
SUT ETHIBOND NAB CT1 #1 30IN (SUTURE) ×6 IMPLANT
SUT MNCRL AB 3-0 PS2 27 (SUTURE) ×1 IMPLANT
SUT VIC AB 0 CT1 27 (SUTURE)
SUT VIC AB 0 CT1 27XBRD ANBCTR (SUTURE) IMPLANT
SUT VIC AB 1 CT1 27 (SUTURE) ×2
SUT VIC AB 1 CT1 27XBRD ANBCTR (SUTURE) ×1 IMPLANT
SUT VIC AB 2-0 CT1 27 (SUTURE) ×2
SUT VIC AB 2-0 CT1 TAPERPNT 27 (SUTURE) ×1 IMPLANT
SUT VLOC 180 0 24IN GS25 (SUTURE) ×2 IMPLANT
TOWEL OR 17X24 6PK STRL BLUE (TOWEL DISPOSABLE) ×2 IMPLANT
TOWEL OR 17X26 10 PK STRL BLUE (TOWEL DISPOSABLE) ×4 IMPLANT
TRAY CATH 16FR W/PLASTIC CATH (SET/KITS/TRAYS/PACK) ×1 IMPLANT
TRAY FOLEY CATH 14FR (SET/KITS/TRAYS/PACK) IMPLANT
WATER STERILE IRR 1000ML POUR (IV SOLUTION) ×4 IMPLANT

## 2014-01-30 NOTE — Anesthesia Procedure Notes (Addendum)
Spinal  Patient location during procedure: OR Start time: 01/30/2014 1:30 PM End time: 01/30/2014 1:37 PM Staffing Anesthesiologist: Roderic Palau E Performed by: anesthesiologist  Preanesthetic Checklist Completed: patient identified, surgical consent, pre-op evaluation, timeout performed, IV checked, risks and benefits discussed and monitors and equipment checked Spinal Block Patient position: sitting Prep: Betadine Patient monitoring: cardiac monitor, continuous pulse ox and blood pressure Approach: midline Location: L2-3 Injection technique: single-shot Needle Needle type: Pencan  Needle gauge: 24 G Needle length: 9 cm Assessment Sensory level: T10 Additional Notes Functioning IV was confirmed and monitors were applied. Sterile prep and drape, including hand hygiene and sterile gloves were used. The patient was positioned and the spine was prepped. The skin was anesthetized with lidocaine.  Free flow of clear CSF was obtained prior to injecting local anesthetic into the CSF.  The spinal needle aspirated freely following injection.  The needle was carefully withdrawn.  The patient tolerated the procedure well.   Procedure Name: MAC Date/Time: 01/30/2014 2:00 PM Performed by: Neldon Newport Pre-anesthesia Checklist: Suction available, Emergency Drugs available, Patient identified, Timeout performed and Patient being monitored Patient Re-evaluated:Patient Re-evaluated prior to inductionOxygen Delivery Method: Nasal cannula Placement Confirmation: positive ETCO2

## 2014-01-30 NOTE — Op Note (Signed)
PRE-OP DIAGNOSIS:  RIGHT HIP DEGENERATIVE JOINT DISEASE POST-OP DIAGNOSIS:  same PROCEDURE: RIGHT TOTAL HIP ARTHROPLASTY ANTERIOR APPROACH ANESTHESIA:  Spinal and MAC SURGEON:  Melrose Nakayama MD ASSISTANT:  Loni Dolly PA-C   INDICATIONS FOR PROCEDURE:  The patient is a 73 y.o. female with a long history of a painful hip.  This has persisted despite multiple conservative measures.  The patient has persisted with pain and dysfunction making rest and activity difficult.  A total hip replacement is offered as surgical treatment.  Informed operative consent was obtained after discussion of possible complications including reaction to anesthesia, infection, neurovascular injury, dislocation, DVT, PE, and death.  The importance of the postoperative rehab program to optimize result was stressed with the patient.  SUMMARY OF FINDINGS AND PROCEDURE:  Under general anesthesia through a anterior approach an the Hana table a right THR was performed.  The patient had severe degenerative change and good bone quality.  We used DePuy components to replace the hip and these were size KA 10 Corail femur capped with a +1 31mm stainless steel hip ball.  On the acetabular side we used a size 48 Gription shell with a  plus 4 neutral polyethylene liner.  We did use a hole eliminator.  Loni Dolly PA-C assisted throughout and was invaluable to the completion of the case in that he helped position and retract while I performed the procedure.  He also closed simultaneously to help minimize OR time.  I used fluoroscopy throughout the case to check position of implants and leg lengths and read all of these views myself.  DESCRIPTION OF PROCEDURE:  The patient was taken to the OR suite where general anesthetic was applied.  The patient was then positioned on the Hana table supine.  All bony prominences were appropriately padded.  Prep and drape was then performed in normal sterile fashion.  The patient was given kefzol preoperative  antibiotic and an appropriate time out was performed.  We then took an anterior approach to the right hip.  Dissection was taken through adipose to the tensor fascia lata fascia.  This structure was incised longitudinally and we dissected in the intermuscular interval just medial to this muscle.  Cobra retractors were placed superior and inferior to the femoral neck superficial to the capsule.  A capsular incision was then made and the retractors were placed along the femoral neck.  Xray was brought in to get a good level for the femoral neck cut which was made with an oscillating saw and osteotome.  The femoral head was removed with a corkscrew.  The acetabulum was exposed and some labral tissues were excised. Reaming was taken to the inside wall of the pelvis and sequentially up to 1 mm smaller than the actual component.  A trial of components was done and then the aforementioned acetabular shell was placed in appropriate tilt and anteversion confirmed by fluoroscopy. The liner was placed along with the hole eliminator and attention was turned to the femur.  The leg was brought down and over into adduction and the elevator bar was used to raise the femur up gently in the wound.  The piriformis was released with care taken to preserve the obturator internus attachment and all of the posterior capsule. The femur was reamed and then broached to the appropriate size.  A trial reduction was done and the aforementioned head and neck assembly gave [place] the best stability in extension with external rotation.  Leg lengths were felt to be about equal  by fluoroscopic exam.  The trial components were removed and the wound irrigated.  We then placed the femoral component in appropriate anteversion.  The head was applied to a dry stem neck and the hip again reduced.  It was again stable in the aforementioned position.  The would was irrigated again followed by re-approximation of anterior capsule with ethibond suture. Tensor  fascia was repaired with V-loc suture  followed by subcutaneous closure with #O and #2 undyed vicryl.  Skin was closed with staples followed by a sterile dressing.  EBL and IOF can be obtained from anesthesia records.  DISPOSITION:  The patient was extubated in the OR and taken to PACU in stable condition to be admitted to the Orthopedic Surgery for appropriate post-op care to include perioperative antibiotics and DVT prophylaxis.

## 2014-01-30 NOTE — Interval H&P Note (Signed)
History and Physical Interval Note:  01/30/2014 1:14 PM  Lynn Carey  has presented today for surgery, with the diagnosis of RIGHT HIP DEGENERATIVE JOINT DISEASE  The various methods of treatment have been discussed with the patient and family. After consideration of risks, benefits and other options for treatment, the patient has consented to  Procedure(s): TOTAL HIP ARTHROPLASTY ANTERIOR APPROACH (Right) as a surgical intervention .  The patient's history has been reviewed, patient examined, no change in status, stable for surgery.  I have reviewed the patient's chart and labs.  Questions were answered to the patient's satisfaction.     Merdith Boyd G

## 2014-01-30 NOTE — Anesthesia Preprocedure Evaluation (Addendum)
Anesthesia Evaluation  Patient identified by MRN, date of birth, ID band Patient awake    Reviewed: Allergy & Precautions, H&P , NPO status , Patient's Chart, lab work & pertinent test results  History of Anesthesia Complications (+) PONV  Airway Mallampati: II  TM Distance: >3 FB Neck ROM: Full    Dental no notable dental hx. (+) Edentulous Upper, Partial Lower, Dental Advisory Given   Pulmonary asthma , former smoker,  breath sounds clear to auscultation  Pulmonary exam normal       Cardiovascular hypertension, Pt. on medications + angina + CAD, + Past MI, + Cardiac Stents, + CABG and +CHF Rhythm:Regular Rate:Normal     Neuro/Psych negative neurological ROS  negative psych ROS   GI/Hepatic Neg liver ROS, GERD-  Medicated and Controlled,  Endo/Other  diabetes, Type 2, Oral Hypoglycemic Agents  Renal/GU negative Renal ROS  negative genitourinary   Musculoskeletal  (+) Arthritis -, Osteoarthritis,    Abdominal   Peds  Hematology negative hematology ROS (+) anemia ,   Anesthesia Other Findings   Reproductive/Obstetrics negative OB ROS                           Anesthesia Physical Anesthesia Plan  ASA: III  Anesthesia Plan: Spinal   Post-op Pain Management:    Induction: Intravenous  Airway Management Planned: Simple Face Mask  Additional Equipment:   Intra-op Plan:   Post-operative Plan:   Informed Consent: I have reviewed the patients History and Physical, chart, labs and discussed the procedure including the risks, benefits and alternatives for the proposed anesthesia with the patient or authorized representative who has indicated his/her understanding and acceptance.   Dental advisory given  Plan Discussed with: CRNA  Anesthesia Plan Comments:         Anesthesia Quick Evaluation

## 2014-01-30 NOTE — Anesthesia Postprocedure Evaluation (Signed)
  Anesthesia Post-op Note  Patient: Lynn Carey  Procedure(s) Performed: Procedure(s): TOTAL HIP ARTHROPLASTY ANTERIOR APPROACH (Right)  Patient Location: PACU  Anesthesia Type: Spinal/MAC  Level of Consciousness: awake and alert   Airway and Oxygen Therapy: Patient Spontanous Breathing  Post-op Pain: mild  Post-op Assessment: Post-op Vital signs reviewed, Patient's Cardiovascular Status Stable and Respiratory Function Stable. No residual motor block.  Post-op Vital Signs: Reviewed  Filed Vitals:   01/30/14 1645  BP: 139/71  Pulse: 86  Temp:   Resp: 16    Complications: No apparent anesthesia complications

## 2014-01-30 NOTE — Transfer of Care (Signed)
Immediate Anesthesia Transfer of Care Note  Patient: Lynn Carey  Procedure(s) Performed: Procedure(s): TOTAL HIP ARTHROPLASTY ANTERIOR APPROACH (Right)  Patient Location: PACU  Anesthesia Type:MAC and Spinal  Level of Consciousness: awake, alert , oriented and patient cooperative  Airway & Oxygen Therapy: Patient Spontanous Breathing  Post-op Assessment: Report given to PACU RN and Post -op Vital signs reviewed and stable  Post vital signs: Reviewed  Complications: No apparent anesthesia complications

## 2014-01-31 LAB — BASIC METABOLIC PANEL
Anion gap: 17 — ABNORMAL HIGH (ref 5–15)
BUN: 21 mg/dL (ref 6–23)
CO2: 22 mEq/L (ref 19–32)
Calcium: 9.1 mg/dL (ref 8.4–10.5)
Chloride: 101 mEq/L (ref 96–112)
Creatinine, Ser: 1.37 mg/dL — ABNORMAL HIGH (ref 0.50–1.10)
GFR calc Af Amer: 43 mL/min — ABNORMAL LOW (ref >90)
GFR calc non Af Amer: 37 mL/min — ABNORMAL LOW (ref >90)
Glucose, Bld: 160 mg/dL — ABNORMAL HIGH (ref 70–99)
Potassium: 4.6 mEq/L (ref 3.7–5.3)
Sodium: 140 mEq/L (ref 137–147)

## 2014-01-31 LAB — GLUCOSE, CAPILLARY
Glucose-Capillary: 153 mg/dL — ABNORMAL HIGH (ref 70–99)
Glucose-Capillary: 159 mg/dL — ABNORMAL HIGH (ref 70–99)
Glucose-Capillary: 132 mg/dL — ABNORMAL HIGH (ref 70–99)
Glucose-Capillary: 133 mg/dL — ABNORMAL HIGH (ref 70–99)

## 2014-01-31 LAB — CBC
HCT: 30.3 % — ABNORMAL LOW (ref 36.0–46.0)
Hemoglobin: 9.6 g/dL — ABNORMAL LOW (ref 12.0–15.0)
MCH: 27.6 pg (ref 26.0–34.0)
MCHC: 31.7 g/dL (ref 30.0–36.0)
MCV: 87.1 fL (ref 78.0–100.0)
Platelets: 332 10*3/uL (ref 150–400)
RBC: 3.48 MIL/uL — ABNORMAL LOW (ref 3.87–5.11)
RDW: 14.2 % (ref 11.5–15.5)
WBC: 9.6 10*3/uL (ref 4.0–10.5)

## 2014-01-31 MED ORDER — SODIUM CHLORIDE 0.9 % IV BOLUS (SEPSIS): 500.0000 mL | Freq: Once | INTRAVENOUS | Status: DC

## 2014-01-31 MED ORDER — INFLUENZA VAC SPLIT QUAD 0.5 ML IM SUSY
0.5000 mL | PREFILLED_SYRINGE | INTRAMUSCULAR | Status: AC
  Administered 2014-02-01: 0.5 mL via INTRAMUSCULAR
  Filled 2014-01-31: qty 0.5

## 2014-01-31 NOTE — Progress Notes (Signed)
Bladder scan redone at 0430 still showed ~120ml in bladder. Patient still unable to void after multiple attempts. On call PA notified. Orders given for 543ml bolus. Hold off on In and out cath till morning MD/PA gets here. Bolus started, will continue to monitor patient.

## 2014-01-31 NOTE — Evaluation (Signed)
Physical Therapy Evaluation Patient Details Name: Lynn Carey MRN: [id number] DOB: [birth year] Today's Date: 01/31/2014   History of Present Illness  s/p total hip arthoplasty anterior approach  Clinical Impression  Pt presents with dependencies in mobility secondary to R THR. Pt would benefit from acute skilled PT to increase Independence with mobility for the next venue of care. Pt reports she plans to d/c to SNF for continued PT and agree with d/c plan.    Follow Up Recommendations SNF    Equipment Recommendations  None recommended by PT    Recommendations for Other Services       Precautions / Restrictions Precautions Precautions: None;Other (comment) Precaution Comments: anterior hip-no precautions Restrictions Weight Bearing Restrictions: Yes RLE Weight Bearing: Weight bearing as tolerated      Mobility  Bed Mobility Overal bed mobility:  (NT-OOB with OT) Bed Mobility: Supine to Sit     Supine to sit: Min assist;HOB elevated     General bed mobility comments: min A to steady trunk, extra time and effort, pt using Lt leg to asssit rt leg during bed mobility.   Transfers Overall transfer level: Needs assistance Equipment used: Rolling walker (2 wheeled) Transfers: Sit to/from Stand   Stand pivot transfers: Min assist       General transfer comment: cues for hand placement for safety.  Ambulation/Gait Ambulation/Gait assistance: Min guard Ambulation Distance (Feet): 45 Feet Assistive device: Rolling walker (2 wheeled) Gait Pattern/deviations: Step-to pattern;Decreased stance time - right;Decreased step length - left;Decreased stride length Gait velocity: decreased Gait velocity interpretation: Below normal speed for age/gender    Stairs            Wheelchair Mobility    Modified Rankin (Stroke Patients Only)       Balance Overall balance assessment: Needs assistance Sitting-balance support: No upper extremity supported;Feet  supported Sitting balance-Leahy Scale: Good     Standing balance support: Bilateral upper extremity supported Standing balance-Leahy Scale: Fair                               Pertinent Vitals/Pain Pain Assessment: 0-10 Pain Score: 5  Pain Location: right hip Pain Descriptors / Indicators: Aching Pain Intervention(s): Limited activity within patient's tolerance    Home Living Family/patient expects to be discharged to:: Skilled nursing facility Living Arrangements: Children               Additional Comments: lives with daughter    Prior Function Level of Independence: Independent with assistive device(s)         Comments: used a cane     Hand Dominance        Extremity/Trunk Assessment   Upper Extremity Assessment: Defer to OT evaluation           Lower Extremity Assessment: RLE deficits/detail RLE Deficits / Details: hipflexion 2/5, knee flexion/ext 3/5, ankle WFL       Communication   Communication: No difficulties  Cognition Arousal/Alertness: Awake/alert Behavior During Therapy: WFL for tasks assessed/performed Overall Cognitive Status: Within Functional Limits for tasks assessed                      General Comments      Exercises Total Joint Exercises Ankle Circles/Pumps: AROM;Strengthening;Both;10 reps;Seated Quad Sets: AROM;Strengthening;Both;10 reps;Seated Heel Slides: AAROM;Strengthening;Right;10 reps;Seated (recliner) Hip ABduction/ADduction: AAROM;Strengthening;Right;10 reps;Seated (recliner) Long Arc Quad: AROM;Strengthening;Right;10 reps;Seated      Assessment/Plan    PT Assessment Patient  needs continued PT services  PT Diagnosis Difficulty walking;Acute pain   PT Problem List Decreased strength;Decreased activity tolerance;Decreased balance;Decreased mobility;Decreased knowledge of use of DME;Pain  PT Treatment Interventions DME instruction;Gait training;Therapeutic activities;Therapeutic  exercise;Balance training;Patient/family education   PT Goals (Current goals can be found in the Care Plan section) Acute Rehab PT Goals Patient Stated Goal: To get Rehab PT Goal Formulation: With patient Time For Goal Achievement: 02/07/14 Potential to Achieve Goals: Good    Frequency 7X/week   Barriers to discharge        Co-evaluation               End of Session Equipment Utilized During Treatment: Gait belt Activity Tolerance: Patient tolerated treatment well Patient left: in chair;with call bell/phone within reach Nurse Communication: Mobility status         Time: [id number] PT Time Calculation (min): 37 min   Charges:   PT Evaluation $Initial PT Evaluation Tier I: 1 Procedure PT Treatments $Gait Training: 8-22 mins $Therapeutic Exercise: 8-22 mins   PT G Codes:          Lelon Mast 01/31/2014, 12:34 PM

## 2014-01-31 NOTE — Progress Notes (Addendum)
Subjective: 1 Day Post-Op Procedure(s) (LRB): TOTAL HIP ARTHROPLASTY ANTERIOR APPROACH (Right)  Patient resting comfortably in bed this morning. She is eating breakfast. She has not urinated yet but is otherwise doing very well. She had a bladder scan this mornign at 4:30 which only showed 131ml and was given a 567ml bolus.  Activity level:  wbat Diet tolerance:  Eating well Voiding:  Has not voided yet Patient reports pain as mild.    Objective: Vital signs in last 24 hours: Temp:  [97.5 F (36.4 C)-98.4 F (36.9 C)] 98.4 F (36.9 C) (10/28 0532) Pulse Rate:  [78-106] 99 (10/28 0532) Resp:  [11-20] 16 (10/28 0532) BP: (89-144)/(51-89) 113/57 mmHg (10/28 0532) SpO2:  [96 %-100 %] 100 % (10/28 0532) Weight:  [73.936 kg (163 lb)] 73.936 kg (163 lb) (10/27 1024)  Labs:  Recent Labs  01/31/14 0556  HGB 9.6*    Recent Labs  01/31/14 0556  WBC 9.6  RBC 3.48*  HCT 30.3*  PLT 332    Recent Labs  01/31/14 0556  NA 140  K 4.6  CL 101  CO2 22  BUN 21  CREATININE 1.37*  GLUCOSE 160*  CALCIUM 9.1   No results found for this basename: LABPT, INR,  in the last 72 hours  Physical Exam:  Neurologically intact ABD soft Neurovascular intact Sensation intact distally Intact pulses distally Dorsiflexion/Plantar flexion intact Incision: dressing C/D/I No cellulitis present Compartment soft  Assessment/Plan:  1 Day Post-Op Procedure(s) (LRB): TOTAL HIP ARTHROPLASTY ANTERIOR APPROACH (Right) Advance diet Up with therapy Discharge to SNF when cleared by PT and doing well. We will continue to run IV fluids and have her take plenty of PO fluids. If she has not urinated by lunchtime we will rescan her bladder and consider an in and out cath. Hopefully this will not be necessary.  Continue on ASA 325mg  BID x 4 weeks for DVT prevention. Follow up in office 2 weeks post op.     Keino Placencia, Larwance Sachs 01/31/2014, 7:50 AM

## 2014-01-31 NOTE — Progress Notes (Signed)
Utilization review completed.  

## 2014-01-31 NOTE — Progress Notes (Signed)
Patient still due to void. Bladder scan done at 0130 showed only 127ml in bladder. Encouraged patient to increase oral intake. Will continue to monitor

## 2014-01-31 NOTE — Evaluation (Signed)
Occupational Therapy Evaluation Patient Details Name: Lynn Carey MRN: [id number] DOB: January 19, 1941 Today's Date: 01/31/2014    History of Present Illness s/p total hip arthoplasty anterior approach   Clinical Impression   Pt admitted with the above diagnoses and presents with below problem list. Pt will benefit from continued OT to address the below listed deficits and maximize independence with basic ADLs prior to d/c to next venue. PTA pt was mod I using a cane. Currently pt is at min A level for stand pivot transfers, mod A for LB ADLs. Recommending SNF for further rehab prior to returning home.      Follow Up Recommendations  SNF    Equipment Recommendations  Other (comment) (defer to next venue)    Recommendations for Other Services       Precautions / Restrictions Precautions Precautions: None;Other (comment) Precaution Comments: anterior hip-no precautions Restrictions Weight Bearing Restrictions: Yes RLE Weight Bearing: Weight bearing as tolerated      Mobility Bed Mobility Overal bed mobility: Needs Assistance Bed Mobility: Supine to Sit     Supine to sit: Min assist;HOB elevated     General bed mobility comments: min A to steady trunk, extra time and effort, pt using Lt leg to asssit rt leg during bed mobility.   Transfers Overall transfer level: Needs assistance Equipment used: Rolling walker (2 wheeled) Transfers: Stand Pivot Transfers   Stand pivot transfers: Min assist       General transfer comment: min A to power up and to steady balance    Balance Overall balance assessment: Needs assistance Sitting-balance support: No upper extremity supported;Feet supported Sitting balance-Leahy Scale: Good     Standing balance support: Bilateral upper extremity supported;During functional activity Standing balance-Leahy Scale: Poor                              ADL Overall ADL's : Needs assistance/impaired Eating/Feeding: Set  up;Sitting   Grooming: Set up;Sitting   Upper Body Bathing: Set up;Sitting   Lower Body Bathing: Minimal assistance;With adaptive equipment;Sit to/from stand   Upper Body Dressing : Set up;Sitting   Lower Body Dressing: With adaptive equipment;Sit to/from stand;Moderate assistance   Toilet Transfer: Minimal assistance;Stand-pivot;BSC;RW   Toileting- Clothing Manipulation and Hygiene: Min guard;Sit to/from stand   Tub/ Shower Transfer: Minimal assistance;Stand-pivot;3 in 1;Rolling walker     General ADL Comments: stand-pivot to recliner from EOB with min A for powerup and to steady balance.     Vision                     Perception     Praxis      Pertinent Vitals/Pain Pain Assessment: 0-10 Pain Score: 5  Pain Location: Rt hip Pain Descriptors / Indicators: Aching Pain Intervention(s): Limited activity within patient's tolerance;Monitored during session;Repositioned;Utilized relaxation techniques     Hand Dominance     Extremity/Trunk Assessment Upper Extremity Assessment Upper Extremity Assessment: Overall WFL for tasks assessed   Lower Extremity Assessment Lower Extremity Assessment: Defer to PT evaluation       Communication Communication Communication: No difficulties   Cognition Arousal/Alertness: Awake/alert Behavior During Therapy: WFL for tasks assessed/performed Overall Cognitive Status: Within Functional Limits for tasks assessed                     General Comments       Exercises       Shoulder Instructions  Home Living Family/patient expects to be discharged to:: Skilled nursing facility Living Arrangements: Children                               Additional Comments: lives with daughter      Prior Functioning/Environment Level of Independence: Independent with assistive device(s)        Comments: used a cane    OT Diagnosis: Acute pain   OT Problem List: Impaired balance (sitting and/or  standing);Decreased knowledge of use of DME or AE;Decreased knowledge of precautions;Pain   OT Treatment/Interventions: Self-care/ADL training;DME and/or AE instruction;Therapeutic activities;Patient/family education;Balance training    OT Goals(Current goals can be found in the care plan section) Acute Rehab OT Goals OT Goal Formulation: With patient Time For Goal Achievement: 02/07/14 Potential to Achieve Goals: Good ADL Goals Pt Will Perform Lower Body Bathing: with min guard assist;sit to/from stand;with adaptive equipment Pt Will Perform Lower Body Dressing: with min guard assist;with adaptive equipment;sit to/from stand Pt Will Transfer to Toilet: with min guard assist;ambulating (3n1 over toilet)  OT Frequency: Min 2X/week   Barriers to D/C:            Co-evaluation              End of Session Equipment Utilized During Treatment: Gait belt;Rolling walker;Oxygen  Activity Tolerance: Patient tolerated treatment well;Patient limited by pain Patient left: in chair;with call bell/phone within reach   Time: [id number] OT Time Calculation (min): 24 min Charges:  OT General Charges $OT Visit: 1 Procedure OT Evaluation $Initial OT Evaluation Tier I: 1 Procedure OT Treatments $Self Care/Home Management : 8-22 mins G-Codes:    Lynn Carey Feb 22, 2014, 11:48 AM

## 2014-01-31 NOTE — Progress Notes (Signed)
OT Cancellation Note  Patient Details Name: Lynn Carey MRN: [id number] DOB: [birth year]   Cancelled Treatment:    Reason Eval/Treat Not Completed: Pain limiting ability to participate. Pt recently given pain med. OT to reattempt.  [name] 01/31/2014, 9:53 AM

## 2014-01-31 NOTE — Clinical Social Work Note (Signed)
CSW met with pt at bedside.  Pt is agreeable to SNF search and requests [facility].  [place] aware. Pt requests transportation at time of discharge. Full assessment to follow.  Nonnie Done, [place] [phone]  Psychiatric & Orthopedics (5N 1-16) Clinical Social Worker

## 2014-02-01 ENCOUNTER — Encounter (HOSPITAL_COMMUNITY): Payer: Self-pay | Admitting: Orthopaedic Surgery

## 2014-02-01 DIAGNOSIS — D62 Acute posthemorrhagic anemia: Secondary | ICD-10-CM | POA: Diagnosis not present

## 2014-02-01 LAB — CBC
HCT: 27.8 % — ABNORMAL LOW (ref 36.0–46.0)
Hemoglobin: 8.6 g/dL — ABNORMAL LOW (ref 12.0–15.0)
MCH: 27.7 pg (ref 26.0–34.0)
MCHC: 30.9 g/dL (ref 30.0–36.0)
MCV: 89.4 fL (ref 78.0–100.0)
Platelets: 310 10*3/uL (ref 150–400)
RBC: 3.11 MIL/uL — ABNORMAL LOW (ref 3.87–5.11)
RDW: 14.4 % (ref 11.5–15.5)
WBC: 10.6 10*3/uL — ABNORMAL HIGH (ref 4.0–10.5)

## 2014-02-01 LAB — GLUCOSE, CAPILLARY
Glucose-Capillary: 160 mg/dL — ABNORMAL HIGH (ref 70–99)
Glucose-Capillary: 141 mg/dL — ABNORMAL HIGH (ref 70–99)
Glucose-Capillary: 158 mg/dL — ABNORMAL HIGH (ref 70–99)
Glucose-Capillary: 115 mg/dL — ABNORMAL HIGH (ref 70–99)

## 2014-02-01 MED ORDER — SODIUM CHLORIDE 0.9 % IV BOLUS (SEPSIS)
500.0000 mL | Freq: Once | INTRAVENOUS | Status: AC
  Administered 2014-02-01: 500 mL via INTRAVENOUS

## 2014-02-01 NOTE — Clinical Social Work Placement (Addendum)
Clinical Social Work Department CLINICAL SOCIAL WORK PLACEMENT NOTE 02/01/2014  Patient:  Lynn Carey, Lynn Carey  Account Number:  [id number] Admit date:  01/30/2014  Clinical Social Worker:  Barbette Or, LCSW  Date/time:  02/01/2014 12:30 PM  Clinical Social Work is seeking post-discharge placement for this patient at the following level of care:   [place]   (*CSW will update this form in Epic as items are completed)   01/31/2014  Patient/family provided with [place] Department of Clinical Social Work's list of facilities offering this level of care within the geographic area requested by the patient (or if unable, by the patient's family).  01/31/2014  Patient/family informed of their freedom to choose among providers that offer the needed level of care, that participate in Medicare, Medicaid or managed care program needed by the patient, have an available bed and are willing to accept the patient.  01/31/2014  Patient/family informed of [facility]' ownership interest in [facility], as well as of the fact that they are under no obligation to receive care at this facility.  PASARR submitted to EDS on  PASARR number received on   FL2 transmitted to all facilities in geographic area requested by pt/family on  01/31/2014 FL2 transmitted to all facilities within larger geographic area on   Patient informed that his/her managed care company has contracts with or will negotiate with  certain facilities, including the following:     Patient/family informed of bed offers received:  02/01/2014 Patient chooses bed at [facility] Physician recommends and patient chooses bed at    Patient to be transferred to [facility] on  02/03/2014  Patient to be transferred to facility by [place] Patient and family notified of transfer on 02/03/2014  Name of family member notified:  Daughter, Willette Cluster and patient   Eduard Clos, MSW, [place] [phone]/weekend coverage

## 2014-02-01 NOTE — Clinical Social Work Note (Signed)
Clinical Social Worker continuing to follow patient and family for support and discharge planning needs.  Patient had requested [facility] who initially stated that they were out of network with patient insurance, however were able to confirm that patient does have network benefits.  CSW spoke with patient and facility who are in agreement with patient plans to discharge to [facility] on 10/30.  CSW remains available for support and to facilitate patient discharge needs once medically ready.  Barbette Or, [place]

## 2014-02-01 NOTE — Progress Notes (Signed)
Physical Therapy Treatment Patient Details Name: Lynn Carey MRN: [id number] DOB: 10-Sep-1940 Today's Date: 02/01/2014    History of Present Illness s/p total hip arthoplasty direct anterior approach    PT Comments    Pt. Believes she is weaker today and in fact had 2/4 dyspnea with limited ambulation. Requiring more physical assist for transition OOB.   She had mild dizziness when up.  O2 sats and HR stable with activity on RA.  Pt. Reports no appetite and has not eaten her lunch.  I discussed with pt's RN Jeani Hawking of the findings in PT session .  [place] to follow up.  Follow Up Recommendations  SNF     Equipment Recommendations  None recommended by PT    Recommendations for Other Services       Precautions / Restrictions Precautions Precautions: None Precaution Comments: anterior hip-no precautions Restrictions Weight Bearing Restrictions: Yes RLE Weight Bearing: Weight bearing as tolerated    Mobility  Bed Mobility Overal bed mobility: Needs Assistance Bed Mobility: Supine to Sit     Supine to sit: Max assist     General bed mobility comments: needed max assist for supine to sit with sue of bed pad to transition hips  Transfers Overall transfer level: Needs assistance Equipment used: Rolling walker (2 wheeled) Transfers: Sit to/from Stand Sit to Stand: Mod assist         General transfer comment: mod assist to power up and to control descent to chair  Ambulation/Gait Ambulation/Gait assistance: Min assist Ambulation Distance (Feet): 8 Feet Assistive device: Rolling walker (2 wheeled) Gait Pattern/deviations: Step-to pattern;Decreased stance time - left;Decreased step length - left Gait velocity: decreased       Stairs            Wheelchair Mobility    Modified Rankin (Stroke Patients Only)       Balance                                    Cognition Arousal/Alertness: Awake/alert Behavior During Therapy: WFL for tasks  assessed/performed Overall Cognitive Status: Within Functional Limits for tasks assessed                      Exercises Total Joint Exercises Ankle Circles/Pumps: AROM;Both;10 reps;Seated Quad Sets: AROM;Both;Seated;10 reps Long Arc Quad: AROM;Right;5 reps;Seated    General Comments        Pertinent Vitals/Pain Pain Assessment: 0-10 Pain Score: 0-No pain Pain Location: denies pain while at rest in bed prior to PT session    Home Living                      Prior Function            PT Goals (current goals can now be found in the care plan section) Progress towards PT goals: Progressing toward goals (slow progress)    Frequency  7X/week    PT Plan Current plan remains appropriate    Co-evaluation             End of Session Equipment Utilized During Treatment: Gait belt Activity Tolerance: Patient limited by fatigue Patient left: in chair;with call bell/phone within reach     Time: 1400-1423 PT Time Calculation (min): 23 min  Charges:  $Gait Training: 8-22 mins $Therapeutic Exercise: 8-22 mins  G CodesLadona Ridgel 02/01/2014, 3:02 PM Gerlean Ren PT Acute Rehab Services [phone] [place], [place] [phone]

## 2014-02-01 NOTE — Progress Notes (Signed)
Subjective: 2 Days Post-Op Procedure(s) (LRB): TOTAL HIP ARTHROPLASTY ANTERIOR APPROACH (Right)  Patient is resting comfortably in bed. She states that she would like to stay one more day before going to SNF. Her Hgb is 8.6 but she is currently asymptomatic so we will continue to monitor. Patient has urinated on her own.   Activity level:  wbat Diet tolerance:  Eating well Voiding:  ok Patient reports pain as mild.    Objective: Vital signs in last 24 hours: Temp:  [98.6 F (37 C)-99.9 F (37.7 C)] 98.6 F (37 C) (10/29 0433) Pulse Rate:  [90-109] 102 (10/29 0433) Resp:  [16-18] 18 (10/29 0433) BP: (87-109)/(42-66) 87/42 mmHg (10/29 0433) SpO2:  [91 %-96 %] 96 % (10/29 0433)  Labs:  Recent Labs  01/31/14 0556 02/01/14 0555  HGB 9.6* 8.6*    Recent Labs  01/31/14 0556 02/01/14 0555  WBC 9.6 10.6*  RBC 3.48* 3.11*  HCT 30.3* 27.8*  PLT 332 310    Recent Labs  01/31/14 0556  NA 140  K 4.6  CL 101  CO2 22  BUN 21  CREATININE 1.37*  GLUCOSE 160*  CALCIUM 9.1   No results found for this basename: LABPT, INR,  in the last 72 hours  Physical Exam:  Neurologically intact ABD soft Neurovascular intact Sensation intact distally Intact pulses distally Dorsiflexion/Plantar flexion intact Incision: dressing C/D/I No cellulitis present Compartment soft  Assessment/Plan:  2 Days Post-Op Procedure(s) (LRB): TOTAL HIP ARTHROPLASTY ANTERIOR APPROACH (Right) Advance diet Up with therapy Plan for discharge tomorrow Discharge to SNF if doing well and cleared by PT. Continue on ASA 325mg  BID x 4 weeks post op.  Follow up in office in 2 weeks post op.     Feras Gardella, Larwance Sachs 02/01/2014, 9:25 AM

## 2014-02-02 LAB — GLUCOSE, CAPILLARY
Glucose-Capillary: 101 mg/dL — ABNORMAL HIGH (ref 70–99)
Glucose-Capillary: 114 mg/dL — ABNORMAL HIGH (ref 70–99)
Glucose-Capillary: 119 mg/dL — ABNORMAL HIGH (ref 70–99)
Glucose-Capillary: 136 mg/dL — ABNORMAL HIGH (ref 70–99)

## 2014-02-02 LAB — CBC
HCT: 24.6 % — ABNORMAL LOW (ref 36.0–46.0)
Hemoglobin: 7.9 g/dL — ABNORMAL LOW (ref 12.0–15.0)
MCH: 27.9 pg (ref 26.0–34.0)
MCHC: 32.1 g/dL (ref 30.0–36.0)
MCV: 86.9 fL (ref 78.0–100.0)
Platelets: 249 10*3/uL (ref 150–400)
RBC: 2.83 MIL/uL — ABNORMAL LOW (ref 3.87–5.11)
RDW: 14.4 % (ref 11.5–15.5)
WBC: 9.1 10*3/uL (ref 4.0–10.5)

## 2014-02-02 LAB — PREPARE RBC (CROSSMATCH)

## 2014-02-02 MED ORDER — SODIUM CHLORIDE 0.9 % IV SOLN
Freq: Once | INTRAVENOUS | Status: AC
  Administered 2014-02-02: 11:00:00 via INTRAVENOUS

## 2014-02-02 MED ORDER — FUROSEMIDE 10 MG/ML IJ SOLN
20.0000 mg | Freq: Once | INTRAMUSCULAR | Status: AC
  Administered 2014-02-02: 20 mg via INTRAVENOUS
  Filled 2014-02-02 (×2): qty 2

## 2014-02-02 NOTE — Progress Notes (Signed)
Subjective: 3 Days Post-Op Procedure(s) (LRB): TOTAL HIP ARTHROPLASTY ANTERIOR APPROACH (Right)  Patient is complaining of fatigue and tiredness. She states that her hip feels good but she is just too tired to get up and do her PT. We discussed giving blood as her Hgb is 7.9 and she is currently symptomatic.    Activity level:  wbat Diet tolerance:  Eating well Voiding:  ok Patient reports pain as mild.    Objective: Vital signs in last 24 hours: Temp:  [97.9 F (36.6 C)-98.6 F (37 C)] 98.5 F (36.9 C) (10/30 0600) Pulse Rate:  [84-114] 84 (10/30 0600) Resp:  [16-18] 18 (10/30 0600) BP: (92-119)/(51-80) 105/51 mmHg (10/30 0600) SpO2:  [91 %-98 %] 98 % (10/30 0600)  Labs:  Recent Labs  01/31/14 0556 02/01/14 0555 02/02/14 0606  HGB 9.6* 8.6* 7.9*    Recent Labs  02/01/14 0555 02/02/14 0606  WBC 10.6* 9.1  RBC 3.11* 2.83*  HCT 27.8* 24.6*  PLT 310 249    Recent Labs  01/31/14 0556  NA 140  K 4.6  CL 101  CO2 22  BUN 21  CREATININE 1.37*  GLUCOSE 160*  CALCIUM 9.1   No results found for this basename: LABPT, INR,  in the last 72 hours  Physical Exam:  Neurologically intact ABD soft Neurovascular intact Sensation intact distally Intact pulses distally Dorsiflexion/Plantar flexion intact Incision: dressing C/D/I No cellulitis present Compartment soft  Assessment/Plan:  3 Days Post-Op Procedure(s) (LRB): TOTAL HIP ARTHROPLASTY ANTERIOR APPROACH (Right) Advance diet Up with therapy Plan for discharge tomorrow Discharge to SNF if feeling better and cleared by PT. We will give the patient blood as her Hgb is 7.9 and she is currently symptomatic. She will cointinue on ASA 325mg  BID x 4 weeks post op.  She will follow up in office 2 weeks post op.     Arya Boxley, Larwance Sachs 02/02/2014, 8:37 AM

## 2014-02-02 NOTE — Progress Notes (Signed)
Physical Therapy Treatment Patient Details Name: Lynn Carey MRN: [id number] DOB: July 03, 1940 Today's Date: 02/02/2014    History of Present Illness s/p total hip arthoplasty direct anterior approach.  Pt. receiving blood transfusion 02/02/14 for hgb 7.9    PT Comments    Pt. Currently receiving first unit of blood.  Pt. Denied dizziness in transfers to [facility] then recliner.  Relates she still feels weak.  Pt. Expects to transition to SNF tomorrow.  Will plan am session for tomorrow.    Follow Up Recommendations  SNF     Equipment Recommendations  None recommended by PT    Recommendations for Other Services       Precautions / Restrictions Precautions Precautions: None Precaution Comments: anterior hip-no precautions Restrictions Weight Bearing Restrictions: Yes RLE Weight Bearing: Weight bearing as tolerated    Mobility  Bed Mobility Overal bed mobility: Needs Assistance Bed Mobility: Supine to Sit     Supine to sit: Mod assist;HOB elevated     General bed mobility comments: mod assist at hips and shoulders to come to EOB  Transfers Overall transfer level: Needs assistance Equipment used: Rolling walker (2 wheeled) Transfers: Sit to/from Stand Sit to Stand: Mod assist         General transfer comment: mod assist for rise to stand and to control descent  Ambulation/Gait Ambulation/Gait assistance: Min assist Ambulation Distance (Feet): 3 Feet Assistive device: Rolling walker (2 wheeled) Gait Pattern/deviations: Step-to pattern Gait velocity: decreased   General Gait Details: pt. able to take 3 pivoting steps to [facility] then on to recliner chair after + urination   Stairs            Wheelchair Mobility    Modified Rankin (Stroke Patients Only)       Balance                                    Cognition Arousal/Alertness: Awake/alert Behavior During Therapy: WFL for tasks assessed/performed Overall Cognitive Status: Within  Functional Limits for tasks assessed                      Exercises Total Joint Exercises Ankle Circles/Pumps: AROM;Both;10 reps;Supine Quad Sets: AROM;Both;10 reps;Seated Short Arc Quad: AAROM;Right;10 reps;Seated Hip ABduction/ADduction: AAROM;Strengthening;Right;10 reps;Seated Long Arc Quad: AROM;Right;5 reps;Seated    General Comments        Pertinent Vitals/Pain Pain Assessment: 0-10 Pain Score: 4  Pain Location: right hip Pain Descriptors / Indicators: Aching Pain Intervention(s): Monitored during session;Limited activity within patient's tolerance;Repositioned;Premedicated before session (per pt., she had pain med recently)    Home Living                      Prior Function            PT Goals (current goals can now be found in the care plan section) Progress towards PT goals: Progressing toward goals    Frequency  7X/week    PT Plan Current plan remains appropriate    Co-evaluation             End of Session Equipment Utilized During Treatment: Gait belt Activity Tolerance: Patient limited by fatigue Patient left: in chair;with call bell/phone within reach     Time: 1440-1453 PT Time Calculation (min): 13 min  Charges:  $Therapeutic Activity: 8-22 mins  G CodesLadona Ridgel 02/02/2014, 3:48 PM Gerlean Ren PT Acute Rehab Services [phone] [place] [phone]

## 2014-02-02 NOTE — Progress Notes (Addendum)
Last vitals were taken Temp: 99.5, BP: 105/61, Resp 16, 02 97%, I called and reported the increase in temperature to the doctor on call; Levy Pupa, PA-C.  At the time the vitals were taken the patient the blood transfusion had completed and the patient did not experience any adverse reactions or have complaints of pain. Storm Frisk stated to keep her informed of any changes.  Patient is resting.

## 2014-02-02 NOTE — Clinical Social Work Psychosocial (Signed)
Clinical Social Work Department BRIEF PSYCHOSOCIAL ASSESSMENT 02/02/2014  Patient:  Lynn Carey, Lynn Carey     Account Number:  [id number]     Admit date:  01/30/2014  Clinical Social Worker:  Wylene Men  Date/Time:  01/31/2014 08:28 AM  Referred by:  Physician  Date Referred:  01/31/2014 Referred for  SNF Placement  Psychosocial assessment   Other Referral:   none   Interview type:  Patient Other interview type:   none    PSYCHOSOCIAL DATA Living Status:  FAMILY Admitted from facility:   Level of care:   Primary support name:  Isidor Holts Primary support relationship to patient:  CHILD, ADULT Degree of support available:   strong    CURRENT CONCERNS Current Concerns  Post-Acute Placement   Other Concerns:   none    SOCIAL WORK ASSESSMENT / PLAN CSW assessed pt at bedside. PT is recommending SNF/STR at time of discharge.  Pt is aware of the recommendations.  Pt is agreeable to SNF/STR and SNF search for [facility]. Pt is requesting [facility] SNF.  [place] aware.  Pt has prepared herself and family for her anticipated SNF admission.  Pt is hopeful to return home after completion of therapy.  Pt currently lives with daughter and states that daughter is agreeable for pt to return onced dc'd from SNF.   Assessment/plan status:  Psychosocial Support/Ongoing Assessment of Needs Other assessment/ plan:   FL2  PASARR   Information/referral to community resources:   SNF/STR    PATIENT'S/FAMILY'S RESPONSE TO PLAN OF CARE: Pt is agreeable to SNF search and placement.  Pt requests [facility] SNF.       Nonnie Done, [place] [phone]  Psychiatric & Orthopedics (5N 1-16) Clinical Social Worker

## 2014-02-02 NOTE — Clinical Social Work Note (Addendum)
12:36pm- If patient were to stabilize over the weekend, SNF/STR admission has been approved by accepting facility: [facility].  Weekend CSW contact information is [phone].  Pt will need transportation: [facility] at time of dc.  Weekend CSW and facility aware.    11:31am- Hoyle Sauer (admission's coordinator) from [facility] will meet with patient at noon today to complete admission's paperwork.  Nonnie Done, [place] [phone]  Psychiatric & Orthopedics (5N 1-16) Clinical Social Worker

## 2014-02-03 LAB — TYPE AND SCREEN
ABO/RH(D): B POS
Antibody Screen: NEGATIVE
Unit division: 0
Unit division: 0

## 2014-02-03 LAB — GLUCOSE, CAPILLARY
Glucose-Capillary: 121 mg/dL — ABNORMAL HIGH (ref 70–99)
Glucose-Capillary: 108 mg/dL — ABNORMAL HIGH (ref 70–99)

## 2014-02-03 MED ORDER — HYDROCODONE-ACETAMINOPHEN 5-325 MG PO TABS: 1.0000 | ORAL_TABLET | ORAL | Status: DC | PRN

## 2014-02-03 MED ORDER — ASPIRIN 325 MG PO TBEC: 325.0000 mg | DELAYED_RELEASE_TABLET | Freq: Two times a day (BID) | ORAL | Status: DC

## 2014-02-03 MED ORDER — BISACODYL 10 MG RE SUPP
10.0000 mg | Freq: Once | RECTAL | Status: AC
  Administered 2014-02-03: 10 mg via RECTAL
  Filled 2014-02-03: qty 1

## 2014-02-03 MED ORDER — METHOCARBAMOL 500 MG PO TABS: 500.0000 mg | ORAL_TABLET | Freq: Four times a day (QID) | ORAL | Status: DC | PRN

## 2014-02-03 NOTE — Progress Notes (Signed)
Physical Therapy Treatment Patient Details Name: KHAMIL PICKETT MRN: [id number] DOB: 08-Jan-1941 Today's Date: 02/03/2014    History of Present Illness s/p total hip arthoplasty direct anterior approach.  Pt. receiving blood transfusion 02/02/14 for hgb 7.9    PT Comments    Patient is progressing well towards physical therapy goals, ambulating up to 100 feet x 2 with the use of a rolling walker, however requires several standing rest breaks and has dyspnea (SpO2 on room air of 93-98%). Tolerates therapeutic exercises well. Patient will continue to benefit from skilled physical therapy services to further improve independence with functional mobility.    Follow Up Recommendations  SNF     Equipment Recommendations  None recommended by PT    Recommendations for Other Services       Precautions / Restrictions Precautions Precautions: None Precaution Comments: anterior hip-no precautions Restrictions Weight Bearing Restrictions: Yes RLE Weight Bearing: Weight bearing as tolerated    Mobility  Bed Mobility Overal bed mobility: Needs Assistance Bed Mobility: Supine to Sit     Supine to sit: HOB elevated;Supervision     General bed mobility comments: Supervision for safety. No physical assist required.  Transfers Overall transfer level: Needs assistance Equipment used: Rolling walker (2 wheeled) Transfers: Sit to/from Stand Sit to Stand: Supervision         General transfer comment: Supervision for safety. Performed from lowest bed setting x2. VC for hand placement. Good control of descent into chair.  Ambulation/Gait Ambulation/Gait assistance: Supervision Ambulation Distance (Feet): 100 Feet (x2) Assistive device: Rolling walker (2 wheeled) Gait Pattern/deviations: Step-through pattern;Decreased step length - left;Decreased stance time - right;Antalgic Gait velocity: decreased   General Gait Details: Educated on safe DME use with rolling walker. VC for walker  placement within base of support. Pt requires several standing rest breaks due to fatigue with dyspnea (SpO2 93-98% on room air).   Stairs            Wheelchair Mobility    Modified Rankin (Stroke Patients Only)       Balance                                    Cognition Arousal/Alertness: Awake/alert Behavior During Therapy: WFL for tasks assessed/performed Overall Cognitive Status: Within Functional Limits for tasks assessed                      Exercises Total Joint Exercises Ankle Circles/Pumps: AROM;Both;10 reps;Seated Short Arc Quad: AAROM;Right;10 reps;Seated Heel Slides: AAROM;Strengthening;Right;Seated;15 reps Hip ABduction/ADduction: AAROM;Strengthening;Right;Seated;15 reps Long Arc Quad: AROM;Right;Seated;Strengthening;15 reps Marching in Standing: Strengthening;Both;10 reps;Seated (in sitting)    General Comments        Pertinent Vitals/Pain Pain Assessment: No/denies pain Pain Intervention(s): Monitored during session    Home Living                      Prior Function            PT Goals (current goals can now be found in the care plan section) Acute Rehab PT Goals PT Goal Formulation: With patient Time For Goal Achievement: 02/07/14 Potential to Achieve Goals: Good Progress towards PT goals: Progressing toward goals    Frequency  7X/week    PT Plan Current plan remains appropriate    Co-evaluation             End of Session Equipment Utilized During Treatment:  Gait belt Activity Tolerance: Patient tolerated treatment well Patient left: in chair;with call bell/phone within reach     Time: 0822-0845 PT Time Calculation (min): 23 min  Charges:  $Gait Training: 8-22 mins $Therapeutic Exercise: 8-22 mins                    G Codes:      [employer], [place]  Ellouise Newer 02/03/2014, 9:47 AM

## 2014-02-03 NOTE — Clinical Social Work Note (Signed)
Patient for d/c today to SNF bed at [facility]. Family and patient agreeable to this plan- plan transfer via EMS. Eduard Clos, MSW, LCSWA [phone]/weekend coverage
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
## 2014-02-03 NOTE — Progress Notes (Signed)
Subjective:  4 Days Post-Op Procedure(s) (LRB):  TOTAL HIP ARTHROPLASTY ANTERIOR APPROACH (Right)   Patient is doing much better and denies fatigue after blood given, has been OOB and in good spirits today. She states that her hip feels good and is ready for PT.    Activity level: wbat  Diet tolerance: Eating well  Voiding: ok  Patient reports pain as mild.   Objective:  BP 112/54  Pulse 90  Temp(Src) 98.6 F (37 C) (Oral)  Resp 16  Ht 4' 11.5" (1.511 m)  Wt 73.936 kg (163 lb)  BMI 32.38 kg/m2  SpO2 96%  CBC Latest Ref Rng 02/02/2014 02/01/2014 01/31/2014  WBC 4.0 - 10.5 K/uL 9.1 10.6(H) 9.6  Hemoglobin 12.0 - 15.0 g/dL 7.9(L) 8.6(L) 9.6(L)  Hematocrit 36.0 - 46.0 % 24.6(L) 27.8(L) 30.3(L)  Platelets 150 - 400 K/uL 249 310 332    Physical Exam:  Neurologically intact  ABD soft  Neurovascular intact  Sensation intact distally  Intact pulses distally  Dorsiflexion/Plantar flexion intact  Incision: dressing C/D/I  No cellulitis present  Compartment soft   Assessment/Plan:   3 Days Post-Op Procedure(s) (LRB):  TOTAL HIP ARTHROPLASTY ANTERIOR APPROACH (Right)   Advance diet  Up with therapy  Discharge to SNF [facility]) Today after PT.  Asymptomatic post transfusion  She will cointinue on ASA 325mg  BID x 4 weeks post op.  She will follow up in office 2 weeks post op.

## 2014-02-05 ENCOUNTER — Non-Acute Institutional Stay (SKILLED_NURSING_FACILITY): Payer: Commercial Managed Care - PPO | Admitting: Internal Medicine

## 2014-02-05 ENCOUNTER — Encounter: Payer: Self-pay | Admitting: Internal Medicine

## 2014-02-05 ENCOUNTER — Other Ambulatory Visit: Payer: Self-pay | Admitting: *Deleted

## 2014-02-05 DIAGNOSIS — I25119 Atherosclerotic heart disease of native coronary artery with unspecified angina pectoris: Secondary | ICD-10-CM | POA: Diagnosis not present

## 2014-02-05 DIAGNOSIS — E785 Hyperlipidemia, unspecified: Secondary | ICD-10-CM | POA: Diagnosis not present

## 2014-02-05 DIAGNOSIS — M1611 Unilateral primary osteoarthritis, right hip: Secondary | ICD-10-CM | POA: Diagnosis not present

## 2014-02-05 DIAGNOSIS — J45909 Unspecified asthma, uncomplicated: Secondary | ICD-10-CM | POA: Diagnosis not present

## 2014-02-05 DIAGNOSIS — D62 Acute posthemorrhagic anemia: Secondary | ICD-10-CM | POA: Diagnosis not present

## 2014-02-05 DIAGNOSIS — K219 Gastro-esophageal reflux disease without esophagitis: Secondary | ICD-10-CM | POA: Diagnosis not present

## 2014-02-05 DIAGNOSIS — E119 Type 2 diabetes mellitus without complications: Secondary | ICD-10-CM | POA: Diagnosis not present

## 2014-02-05 DIAGNOSIS — K5900 Constipation, unspecified: Secondary | ICD-10-CM | POA: Diagnosis not present

## 2014-02-05 MED ORDER — HYDROCODONE-ACETAMINOPHEN 5-325 MG PO TABS: 1.0000 | ORAL_TABLET | ORAL | Status: DC | PRN

## 2014-02-05 NOTE — Telephone Encounter (Signed)
[facility] 
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
## 2014-02-14 ENCOUNTER — Encounter: Payer: Self-pay | Admitting: Adult Health

## 2014-02-14 ENCOUNTER — Non-Acute Institutional Stay (SKILLED_NURSING_FACILITY): Payer: Commercial Managed Care - PPO | Admitting: Adult Health

## 2014-02-14 DIAGNOSIS — J309 Allergic rhinitis, unspecified: Secondary | ICD-10-CM

## 2014-02-14 DIAGNOSIS — G629 Polyneuropathy, unspecified: Secondary | ICD-10-CM

## 2014-02-14 DIAGNOSIS — I509 Heart failure, unspecified: Secondary | ICD-10-CM

## 2014-02-14 DIAGNOSIS — E1140 Type 2 diabetes mellitus with diabetic neuropathy, unspecified: Secondary | ICD-10-CM

## 2014-02-14 DIAGNOSIS — D62 Acute posthemorrhagic anemia: Secondary | ICD-10-CM

## 2014-02-14 DIAGNOSIS — I10 Essential (primary) hypertension: Secondary | ICD-10-CM

## 2014-02-14 DIAGNOSIS — M1611 Unilateral primary osteoarthritis, right hip: Secondary | ICD-10-CM

## 2014-02-14 DIAGNOSIS — E785 Hyperlipidemia, unspecified: Secondary | ICD-10-CM
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
## 2014-03-15 ENCOUNTER — Encounter (HOSPITAL_COMMUNITY): Payer: Self-pay | Admitting: Cardiology

## 2014-05-28 DIAGNOSIS — I6523 Occlusion and stenosis of bilateral carotid arteries: Secondary | ICD-10-CM | POA: Diagnosis not present

## 2014-05-28 DIAGNOSIS — I2510 Atherosclerotic heart disease of native coronary artery without angina pectoris: Secondary | ICD-10-CM | POA: Diagnosis not present

## 2014-05-28 DIAGNOSIS — I5042 Chronic combined systolic (congestive) and diastolic (congestive) heart failure: Secondary | ICD-10-CM | POA: Diagnosis not present

## 2014-05-28 DIAGNOSIS — I10 Essential (primary) hypertension: Secondary | ICD-10-CM | POA: Diagnosis not present

## 2014-06-28 DIAGNOSIS — N182 Chronic kidney disease, stage 2 (mild): Secondary | ICD-10-CM | POA: Diagnosis not present

## 2014-06-28 DIAGNOSIS — J4541 Moderate persistent asthma with (acute) exacerbation: Secondary | ICD-10-CM | POA: Diagnosis not present

## 2014-06-28 DIAGNOSIS — I129 Hypertensive chronic kidney disease with stage 1 through stage 4 chronic kidney disease, or unspecified chronic kidney disease: Secondary | ICD-10-CM | POA: Diagnosis not present

## 2014-06-28 DIAGNOSIS — E1122 Type 2 diabetes mellitus with diabetic chronic kidney disease: Secondary | ICD-10-CM | POA: Diagnosis not present

## 2014-07-27 DIAGNOSIS — H16223 Keratoconjunctivitis sicca, not specified as Sjogren's, bilateral: Secondary | ICD-10-CM | POA: Diagnosis not present

## 2014-07-27 DIAGNOSIS — H402290 Chronic angle-closure glaucoma, unspecified eye, stage unspecified: Secondary | ICD-10-CM | POA: Diagnosis not present

## 2014-07-27 DIAGNOSIS — H25813 Combined forms of age-related cataract, bilateral: Secondary | ICD-10-CM | POA: Diagnosis not present

## 2014-09-06 DIAGNOSIS — N183 Chronic kidney disease, stage 3 (moderate): Secondary | ICD-10-CM | POA: Diagnosis not present

## 2014-09-06 DIAGNOSIS — N08 Glomerular disorders in diseases classified elsewhere: Secondary | ICD-10-CM | POA: Diagnosis not present

## 2014-09-06 DIAGNOSIS — E1122 Type 2 diabetes mellitus with diabetic chronic kidney disease: Secondary | ICD-10-CM | POA: Diagnosis not present

## 2014-09-06 DIAGNOSIS — I1310 Hypertensive heart and chronic kidney disease without heart failure, with stage 1 through stage 4 chronic kidney disease, or unspecified chronic kidney disease: Secondary | ICD-10-CM | POA: Diagnosis not present

## 2014-09-27 DIAGNOSIS — H25813 Combined forms of age-related cataract, bilateral: Secondary | ICD-10-CM | POA: Diagnosis not present

## 2014-09-27 DIAGNOSIS — H402290 Chronic angle-closure glaucoma, unspecified eye, stage unspecified: Secondary | ICD-10-CM | POA: Diagnosis not present

## 2014-09-27 DIAGNOSIS — H16223 Keratoconjunctivitis sicca, not specified as Sjogren's, bilateral: Secondary | ICD-10-CM | POA: Diagnosis not present

## 2014-11-30 DIAGNOSIS — I5042 Chronic combined systolic (congestive) and diastolic (congestive) heart failure: Secondary | ICD-10-CM | POA: Diagnosis not present

## 2014-11-30 DIAGNOSIS — I6523 Occlusion and stenosis of bilateral carotid arteries: Secondary | ICD-10-CM | POA: Diagnosis not present

## 2014-11-30 DIAGNOSIS — I2510 Atherosclerotic heart disease of native coronary artery without angina pectoris: Secondary | ICD-10-CM | POA: Diagnosis not present

## 2014-11-30 DIAGNOSIS — I10 Essential (primary) hypertension: Secondary | ICD-10-CM | POA: Diagnosis not present

## 2014-12-24 DIAGNOSIS — N08 Glomerular disorders in diseases classified elsewhere: Secondary | ICD-10-CM | POA: Diagnosis not present

## 2014-12-24 DIAGNOSIS — I1310 Hypertensive heart and chronic kidney disease without heart failure, with stage 1 through stage 4 chronic kidney disease, or unspecified chronic kidney disease: Secondary | ICD-10-CM | POA: Diagnosis not present

## 2014-12-24 DIAGNOSIS — N183 Chronic kidney disease, stage 3 (moderate): Secondary | ICD-10-CM | POA: Diagnosis not present

## 2014-12-24 DIAGNOSIS — E1122 Type 2 diabetes mellitus with diabetic chronic kidney disease: Secondary | ICD-10-CM | POA: Diagnosis not present

## 2015-02-05 ENCOUNTER — Encounter: Payer: Self-pay | Admitting: *Deleted

## 2015-06-07 DIAGNOSIS — N08 Glomerular disorders in diseases classified elsewhere: Secondary | ICD-10-CM | POA: Diagnosis not present

## 2015-06-07 DIAGNOSIS — I1310 Hypertensive heart and chronic kidney disease without heart failure, with stage 1 through stage 4 chronic kidney disease, or unspecified chronic kidney disease: Secondary | ICD-10-CM | POA: Diagnosis not present

## 2015-06-07 DIAGNOSIS — Z0000 Encounter for general adult medical examination without abnormal findings: Secondary | ICD-10-CM | POA: Diagnosis not present

## 2015-06-07 DIAGNOSIS — N183 Chronic kidney disease, stage 3 (moderate): Secondary | ICD-10-CM | POA: Diagnosis not present

## 2015-06-07 DIAGNOSIS — E1122 Type 2 diabetes mellitus with diabetic chronic kidney disease: Secondary | ICD-10-CM | POA: Diagnosis not present

## 2015-06-07 DIAGNOSIS — I2510 Atherosclerotic heart disease of native coronary artery without angina pectoris: Secondary | ICD-10-CM | POA: Diagnosis not present

## 2015-06-07 DIAGNOSIS — E559 Vitamin D deficiency, unspecified: Secondary | ICD-10-CM | POA: Diagnosis not present

## 2015-07-11 DIAGNOSIS — J209 Acute bronchitis, unspecified: Secondary | ICD-10-CM | POA: Diagnosis not present

## 2015-07-24 DIAGNOSIS — I6523 Occlusion and stenosis of bilateral carotid arteries: Secondary | ICD-10-CM | POA: Diagnosis not present

## 2015-07-31 DIAGNOSIS — I6523 Occlusion and stenosis of bilateral carotid arteries: Secondary | ICD-10-CM | POA: Diagnosis not present

## 2015-07-31 DIAGNOSIS — I2510 Atherosclerotic heart disease of native coronary artery without angina pectoris: Secondary | ICD-10-CM | POA: Diagnosis not present

## 2015-07-31 DIAGNOSIS — I10 Essential (primary) hypertension: Secondary | ICD-10-CM | POA: Diagnosis not present

## 2015-07-31 DIAGNOSIS — I5042 Chronic combined systolic (congestive) and diastolic (congestive) heart failure: Secondary | ICD-10-CM | POA: Diagnosis not present

## 2015-10-09 DIAGNOSIS — N183 Chronic kidney disease, stage 3 (moderate): Secondary | ICD-10-CM | POA: Diagnosis not present

## 2015-10-09 DIAGNOSIS — Z79899 Other long term (current) drug therapy: Secondary | ICD-10-CM | POA: Diagnosis not present

## 2015-10-09 DIAGNOSIS — E1122 Type 2 diabetes mellitus with diabetic chronic kidney disease: Secondary | ICD-10-CM | POA: Diagnosis not present

## 2015-10-09 DIAGNOSIS — N08 Glomerular disorders in diseases classified elsewhere: Secondary | ICD-10-CM | POA: Diagnosis not present

## 2015-10-09 DIAGNOSIS — I1310 Hypertensive heart and chronic kidney disease without heart failure, with stage 1 through stage 4 chronic kidney disease, or unspecified chronic kidney disease: Secondary | ICD-10-CM | POA: Diagnosis not present

## 2015-12-05 DIAGNOSIS — Z87891 Personal history of nicotine dependence: Secondary | ICD-10-CM | POA: Diagnosis not present

## 2015-12-05 DIAGNOSIS — J209 Acute bronchitis, unspecified: Secondary | ICD-10-CM | POA: Diagnosis not present

## 2016-01-23 DIAGNOSIS — I6523 Occlusion and stenosis of bilateral carotid arteries: Secondary | ICD-10-CM | POA: Diagnosis not present

## 2016-01-30 DIAGNOSIS — I2510 Atherosclerotic heart disease of native coronary artery without angina pectoris: Secondary | ICD-10-CM | POA: Diagnosis not present

## 2016-01-30 DIAGNOSIS — I5042 Chronic combined systolic (congestive) and diastolic (congestive) heart failure: Secondary | ICD-10-CM | POA: Diagnosis not present

## 2016-01-30 DIAGNOSIS — I10 Essential (primary) hypertension: Secondary | ICD-10-CM | POA: Diagnosis not present

## 2016-01-30 DIAGNOSIS — I6523 Occlusion and stenosis of bilateral carotid arteries: Secondary | ICD-10-CM | POA: Diagnosis not present

## 2016-02-17 DIAGNOSIS — I1310 Hypertensive heart and chronic kidney disease without heart failure, with stage 1 through stage 4 chronic kidney disease, or unspecified chronic kidney disease: Secondary | ICD-10-CM | POA: Diagnosis not present

## 2016-02-17 DIAGNOSIS — N08 Glomerular disorders in diseases classified elsewhere: Secondary | ICD-10-CM | POA: Diagnosis not present

## 2016-02-17 DIAGNOSIS — Z1389 Encounter for screening for other disorder: Secondary | ICD-10-CM | POA: Diagnosis not present

## 2016-02-17 DIAGNOSIS — I2510 Atherosclerotic heart disease of native coronary artery without angina pectoris: Secondary | ICD-10-CM | POA: Diagnosis not present

## 2016-02-17 DIAGNOSIS — N183 Chronic kidney disease, stage 3 (moderate): Secondary | ICD-10-CM | POA: Diagnosis not present

## 2016-02-17 DIAGNOSIS — Z23 Encounter for immunization: Secondary | ICD-10-CM | POA: Diagnosis not present

## 2016-02-17 DIAGNOSIS — E1122 Type 2 diabetes mellitus with diabetic chronic kidney disease: Secondary | ICD-10-CM | POA: Diagnosis not present

## 2016-04-28 DIAGNOSIS — I6529 Occlusion and stenosis of unspecified carotid artery: Secondary | ICD-10-CM | POA: Diagnosis not present

## 2016-05-11 DIAGNOSIS — J209 Acute bronchitis, unspecified: Secondary | ICD-10-CM | POA: Diagnosis not present

## 2016-05-12 ENCOUNTER — Other Ambulatory Visit: Payer: Self-pay | Admitting: Nurse Practitioner

## 2016-05-12 ENCOUNTER — Ambulatory Visit
Admission: RE | Admit: 2016-05-12 | Discharge: 2016-05-12 | Disposition: A | Discharge status: Home / Self Care | Payer: Commercial Managed Care - PPO | Source: Ambulatory Visit | Attending: Nurse Practitioner | Admitting: Nurse Practitioner

## 2016-05-12 DIAGNOSIS — R05 Cough: Secondary | ICD-10-CM

## 2016-05-12 DIAGNOSIS — R053 Chronic cough: Secondary | ICD-10-CM

## 2016-06-09 DIAGNOSIS — E559 Vitamin D deficiency, unspecified: Secondary | ICD-10-CM | POA: Diagnosis not present

## 2016-06-09 DIAGNOSIS — N183 Chronic kidney disease, stage 3 (moderate): Secondary | ICD-10-CM | POA: Diagnosis not present

## 2016-06-09 DIAGNOSIS — N08 Glomerular disorders in diseases classified elsewhere: Secondary | ICD-10-CM | POA: Diagnosis not present

## 2016-06-09 DIAGNOSIS — Z0000 Encounter for general adult medical examination without abnormal findings: Secondary | ICD-10-CM | POA: Diagnosis not present

## 2016-06-09 DIAGNOSIS — I1310 Hypertensive heart and chronic kidney disease without heart failure, with stage 1 through stage 4 chronic kidney disease, or unspecified chronic kidney disease: Secondary | ICD-10-CM | POA: Diagnosis not present

## 2016-06-09 DIAGNOSIS — E1122 Type 2 diabetes mellitus with diabetic chronic kidney disease: Secondary | ICD-10-CM | POA: Diagnosis not present

## 2016-10-09 DIAGNOSIS — I10 Essential (primary) hypertension: Secondary | ICD-10-CM | POA: Diagnosis not present

## 2016-10-09 DIAGNOSIS — I2510 Atherosclerotic heart disease of native coronary artery without angina pectoris: Secondary | ICD-10-CM | POA: Diagnosis not present

## 2016-10-09 DIAGNOSIS — I6523 Occlusion and stenosis of bilateral carotid arteries: Secondary | ICD-10-CM | POA: Diagnosis not present

## 2016-10-09 DIAGNOSIS — I5042 Chronic combined systolic (congestive) and diastolic (congestive) heart failure: Secondary | ICD-10-CM | POA: Diagnosis not present

## 2016-10-22 DIAGNOSIS — E1122 Type 2 diabetes mellitus with diabetic chronic kidney disease: Secondary | ICD-10-CM | POA: Diagnosis not present

## 2016-10-22 DIAGNOSIS — N183 Chronic kidney disease, stage 3 (moderate): Secondary | ICD-10-CM | POA: Diagnosis not present

## 2016-10-22 DIAGNOSIS — I1310 Hypertensive heart and chronic kidney disease without heart failure, with stage 1 through stage 4 chronic kidney disease, or unspecified chronic kidney disease: Secondary | ICD-10-CM | POA: Diagnosis not present

## 2016-10-22 DIAGNOSIS — N08 Glomerular disorders in diseases classified elsewhere: Secondary | ICD-10-CM | POA: Diagnosis not present

## 2016-10-27 DIAGNOSIS — I6523 Occlusion and stenosis of bilateral carotid arteries: Secondary | ICD-10-CM | POA: Diagnosis not present

## 2017-02-17 DIAGNOSIS — E1122 Type 2 diabetes mellitus with diabetic chronic kidney disease: Secondary | ICD-10-CM | POA: Diagnosis not present

## 2017-02-17 DIAGNOSIS — N183 Chronic kidney disease, stage 3 (moderate): Secondary | ICD-10-CM | POA: Diagnosis not present

## 2017-02-17 DIAGNOSIS — I1310 Hypertensive heart and chronic kidney disease without heart failure, with stage 1 through stage 4 chronic kidney disease, or unspecified chronic kidney disease: Secondary | ICD-10-CM | POA: Diagnosis not present

## 2017-02-17 DIAGNOSIS — N08 Glomerular disorders in diseases classified elsewhere: Secondary | ICD-10-CM | POA: Diagnosis not present

## 2017-04-28 DIAGNOSIS — I6523 Occlusion and stenosis of bilateral carotid arteries: Secondary | ICD-10-CM | POA: Diagnosis not present

## 2017-05-20 DIAGNOSIS — Z79899 Other long term (current) drug therapy: Secondary | ICD-10-CM | POA: Diagnosis not present

## 2017-05-20 DIAGNOSIS — N183 Chronic kidney disease, stage 3 (moderate): Secondary | ICD-10-CM | POA: Diagnosis not present

## 2017-05-20 DIAGNOSIS — I1310 Hypertensive heart and chronic kidney disease without heart failure, with stage 1 through stage 4 chronic kidney disease, or unspecified chronic kidney disease: Secondary | ICD-10-CM | POA: Diagnosis not present

## 2017-05-20 DIAGNOSIS — E1122 Type 2 diabetes mellitus with diabetic chronic kidney disease: Secondary | ICD-10-CM | POA: Diagnosis not present

## 2017-05-20 DIAGNOSIS — R5383 Other fatigue: Secondary | ICD-10-CM | POA: Diagnosis not present

## 2017-05-20 DIAGNOSIS — N08 Glomerular disorders in diseases classified elsewhere: Secondary | ICD-10-CM | POA: Diagnosis not present

## 2017-05-24 DIAGNOSIS — I25119 Atherosclerotic heart disease of native coronary artery with unspecified angina pectoris: Secondary | ICD-10-CM | POA: Diagnosis not present

## 2017-05-24 DIAGNOSIS — I6523 Occlusion and stenosis of bilateral carotid arteries: Secondary | ICD-10-CM | POA: Diagnosis not present

## 2017-05-24 DIAGNOSIS — E782 Mixed hyperlipidemia: Secondary | ICD-10-CM | POA: Diagnosis not present

## 2017-05-24 DIAGNOSIS — I5042 Chronic combined systolic (congestive) and diastolic (congestive) heart failure: Secondary | ICD-10-CM | POA: Diagnosis not present

## 2017-06-02 DIAGNOSIS — I5042 Chronic combined systolic (congestive) and diastolic (congestive) heart failure: Secondary | ICD-10-CM | POA: Diagnosis not present

## 2017-06-04 DIAGNOSIS — I5042 Chronic combined systolic (congestive) and diastolic (congestive) heart failure: Secondary | ICD-10-CM | POA: Diagnosis not present

## 2017-06-06 DIAGNOSIS — I200 Unstable angina: Secondary | ICD-10-CM | POA: Diagnosis present

## 2017-06-06 NOTE — H&P (Signed)
OFFICE VISIT NOTES COPIED TO EPIC FOR DOCUMENTATION  . History of Present Illness Laverda Page MD; 05/24/2017 7:05 PM) Patient words: last O/V 10/09/16; 77mh f/u for carotid stenosis, cad, chf - Pt c/o having a knot in her chest when she walks and symptoms are severe.  The patient is a 77year old female who presents for a Follow-up for Carotid artery stenosis.  Additional reasons for visit:  Follow-up for Coronary artery disease is described as the following: Ms. MEllionna Buckbeeis a pleasant AA female with a history of known coronary artery disease with CABG in 1997 with LIMA to LAD and SVG to RCA and has high-grade OM1 stenosis, being medically managed, nonischemic cardiomyopathy, decreased EF of 35%, out of proportion to CAD. She has bilateral asymptomatic high grade (70% by duplex) carotid stenosis, hyperlipidemia, asthmatic bronchitis and hypertension. She is still working in [employer]   Until 2-3 months ago she had been doing well but now has noticed doing even routine chores in the house or at workplace, has severe chest tightness, and she was in tears explaining that she is having heart time doing any activity and is concerned about her symptoms. She is also concerned about elevated lipids. States that she is doing her best with regard to the diet and exercise. Has used several nitroglycerin and is carrying it with her.She is tolerating all her medications well. No bleeding diathesis, no dark stools.. No symptoms to suggest sleep apnea.    Problem List/Past Medical (Laverda Page MD; 05/24/2017 7:02 PM) Atherosclerosis of native arteries of the extremities with intermittent claudication (440.21)  Atherosclerosis of native coronary artery of native heart without angina pectoris (I25.10)  Exercise sestamibi 04/03/11: 3 min, 3.7 METs. Strongly +ve ischemic EKG change. Inferior scar noted. EF 36%.  Heart cath 05/12/11: Unsuccessful attempt at PCI circumflex and  RI. 99% stenosis. Medical therpy only. Heart cath 04/21/11 (report) LM stent 2004. (CABG 1997) LIMA to LAD, SVG to PDA patent. RI, Circ 99%. Postsurgical aortocoronary bypass status (Z95.1)  Other and unspecified angina pectoris (413.9) (I20.9)  Mixed hyperlipidemia (E78.2)  Asymptomatic bilateral carotid artery stenosis ((T41.96  Carotid artery duplex 04/28/2017: Stenosis in the right internal carotid artery (>=70%). Mild stenosis in the right external carotid artery (<50%). Stenosis in the left internal carotid artery (>=70%). The PSV internal/common carotid artery ratio is consistent with a stenosis of >70% bilaterally. Antegrade right vertebral artery flow. Antegrade left vertebral artery flow. Follow up in six months is appropriate if clinically indicated. Compared to the study done on 10/23/2016, no significant change. Mitral regurgitation (I34.0)  Benign essential HTN (I10)  Heart failure, systolic and diastolic, chronic (IQ22.97  Echocardiogram 12/18/2014: Left ventricle cavity is normal in size. Moderate decrease in global wall motion. Doppler evidence of grade II (pseudonormal) diastolic dysfunction. Diastolic dysfunction findings suggests elevated LA/LV end diastolic pressure. Visual EF is 35-40%. Calculated EF 39%. Left atrial cavity is mildly dilated. Mild mitral regurgitation. Trace tricuspid regurgitation. No evidence of pulmonary hypertension. Compared to the study done on 07/13/2013, no significant change. Labwork  10/09/2015: Vitamin B12 385, HbA1c 6.0%, creatinine 1.04, potassium 4.2, CMP normal 06/07/2015: HbA1c 6.0%, TSH 2.18, vitamin D 34, HB 10.7/HCT 33.8 with normocytic indices, serum glucose 105, creatinine 1.10, eGFR 57, potassium 4.3, CMP normal, total cholesterol 170, triglycerides 121, HDL 58, LDL 88 09/06/2014: HbA1c 6.3%, creatinine 1.20, eGFR 45, potassium 4.7, CMP otherwise normal 03/27/2014: HbA1c 6.2%, TSH 1.490, total cholesterol 175, HDL 55, LDL 76, triglycerides 219,  creatinine 1.12, CMP otherwise normal, CBC normal 08/09/2013: LFT normal. Total cholesterol 239, triglycerides 151, HDL 66, LDL 143, BNP 128, BUN 33, serum creatinine 1.26, eGFR 49 mL. Potassium was 5.2. Labs suggest mild renal insufficiency. 07/18/2012: Total cholesterol 146, triglycerides 118, HDL 47, LDL 75. LDL particle # 961. LP-IR score 58, HbA1c 6.3% , PTH 39. Blood sugar 95, BUN 18, serum creatinine 1.03. eGFR 55 mL. CBC within normal limits except for mildly elevated platelets of 445, TSH 1.480. DM (diabetes mellitus) type II, controlled, with peripheral vascular disorder (E11.51)   Allergies Georgeanna Harrison; 05/30/17 3:52 PM) Lisinopril *CHEMICALS*  Cough.  Family History Georgeanna Harrison; 30-May-2017 3:52 PM) Mother  Deceased. at age 31, from [place] Father  Deceased. died in his late 06/17/22, early 84's from a MI. Siblings  1 half brother Deceased from an MI, CABG at age 21. 80 half sister with heart murmur.  Social History Georgeanna Harrison; May 30, 2017 3:52 PM) Current tobacco use  Never smoker. Non Drinker/No Alcohol Use  Marital status  Divorced. Living Situation  lives with daughter Number of Children  5.  Past Surgical History Georgeanna Harrison; 05-30-17 3:52 PM) H/O CABGs in [place] cath 04/21/11: EF 45%/. LM stent 06-17-02.  LIMA to LAD, SVG to PDA patent.  Circumflex coronary artery 99% on 05/12/11: Unsuccessful attempt at PCI circumflex and Ramus intermediate.  Medical therapy only. Circumflex coronary artery has large OM2,OM3. 80% stenosis at OM2 origin SVG to obtuse marginal was occluded. Left subclavian artery showed a 30% stenosis with mild damping.  Hip Replacement - Left [12/13/2012]: Total Hip Replacement - Right [01/30/2014]: Right.  Medication History Laverda Page, MD; 05/30/2017 4:29 PM) BiDil (20-37.'5MG'$  Tablet, 1 (one) Oral three times daily, Taken starting 09/04/2016) Active. Ranexa ('500MG'$  Tablet ER 12HR, 1 Oral two times daily, Taken  starting 10/29/2014) Active. Nitrostat (0.'4MG'$  Tab Sublingual, 1 (one) Sublingual take as directed for chest pain every 5 minutes, Taken starting 07/18/2014) Active. Tylenol Arthritis Ext Relief ('650MG'$  Tablet ER, 1 Oral every four to six hours as needed, Taken starting 08/04/2013) Active. Aspirin ('81MG'$  Tablet, 1 Oral two times daily) Active. Crestor ('20MG'$  Tablet, 1 Oral daily) Active. Januvia ('100MG'$  Tablet, 1 Oral daily) Active. NexIUM ('40MG'$  Capsule DR, 1 Oral two times daily) Active. Vitamin D (2000UNIT Capsule, 2 Oral daily) Active. ProAir HFA (108 (90 Base)MCG/ACT Aerosol Soln, 2 puffs every four hours Inhalation as needed) Active. Montelukast Sodium ('10MG'$  Tablet, 1 Oral in the evening) Active. Latanoprost (0.005% Solution, 1 drop in both eyes Ophthalmic in the evening) Active. Restasis (0.05% Emulsion, 1 drop each eye Ophthalmic two times daily) Active. Fluticasone Propionate (50MCG/ACT Suspension, 1 spray each nostril Nasal daily as needed) Active. Benzonatate ('100MG'$  Capsule, 1 Oral every 4-6 hours as needed for cough) Active. Telmisartan-HCTZ (80-12.'5MG'$  Tablet, 1 Oral daily) Active. Temazepam ('15MG'$  Capsule, 1 Oral at bedtime as needed) Active. Medications Reconciled (Pt brought medications)  Diagnostic Studies History (April Garrison; 05/30/2017 7:00 PM) Echocardiogram  Echo- 07/13/13 1. Left ventricular cavity is normal in size. Mild concentric hypertrophy. Moderate global hypokinesis. Moderately decreased systolic global function. Calculated EF 31%. Visual EF is 35-40%. Doppler evidence of Grade I (impaired) diastolic dysfunction. 2. Left atrial cavity is mild to moderately dilated. 3. Mild aortic valve leaflet thickening. 4. Mitral valve structurally normal. Moderate mitral regurgitation. Mitral valve inflow A > E ratio. 5. Tricuspid valve structurally normal. Mild tricuspid regurgitation. 6. No diagnostic change c.f. echo. of 07/21/2012 Heart cath 05/12/11: Unsuccessful  attempt at PCI circumflex and RI.  99% stenosis. Medical therpy only.  Exercise sestamibi 04/03/11: 3 min, 3.7 METs. Strongly +ve ischemic EKG change. Inferior scar noted. EF 36%.  Carotid Doppler  04/28/2017: Stenosis in the right internal carotid artery (>=70%). Mild stenosis in the right external carotid artery (<50%). Stenosis in the left internal carotid artery (>=70%). The PSV internal/common carotid artery ratio is consistent with a stenosis of >70% bilaterally. Antegrade right vertebral artery flow. Antegrade left vertebral artery flow. Follow up in six months is appropriate if clinically indicated. Compared to the study done on 10/23/2016, no significant change.  Other Problems Laverda Page, MD; 05/24/2017 7:02 PM) Carotid stenosis (I65.29)     Review of Systems Laverda Page MD; 05/24/2017 6:59 PM) General Present- Fatigue. Not Present- Fever, Night Sweats and Weight Gain. Skin Not Present- Itching and Rash. HEENT Not Present- Headache. Respiratory Present- Cough (chronic) and Difficulty Breathing on Exertion (Has asthmatic bronchitis). Not Present- Wakes up from Sleep Wheezing or Short of Breath. Cardiovascular Present- Chest Pain. Not Present- Claudications, Edema, Fainting, Orthopnea and Swelling of Extremities. Gastrointestinal Not Present- Abdominal Pain, Constipation, Diarrhea, Nausea and Vomiting. Musculoskeletal Present- Joint Pain (right hip and knee pain especially when she sits and has to get up. Left hip replacement. Lower back pain). Not Present- Joint Swelling. Neurological Not Present- Headaches. Hematology Not Present- Blood Clots, Easy Bruising and Nose Bleed. All other systems negative  Vitals Laverda Page MD; 05/24/2017 4:30 PM) 05/24/2017 3:55 PM Weight: 154.38 lb Height: 59.5in Body Surface Area: 1.66 m Body Mass Index: 30.66 kg/m  Pulse: 92 (Regular)  P.OX: 94% (Room air) BP: 114/80 (Sitting, Left Arm,  Standard)       Physical Exam Laverda Page MD; 05/24/2017 6:59 PM) General Mental Status-Alert. General Appearance-Cooperative, Appears stated age, Not in acute distress. Orientation-Oriented X3. Build & Nutrition-Short statured and Mildly obese.  Head and Neck Thyroid Gland Characteristics - no palpable nodules, no palpable enlargement.  Chest and Lung Exam Chest and lung exam reveals -quiet, even and easy respiratory effort with no use of accessory muscles. Palpation Tender - No chest wall tenderness. Auscultation Adventitious sounds - Expiratory & inspiratory wheeze - Both Lung Fields.  Cardiovascular Cardiovascular examination reveals -normal heart sounds, regular rate and rhythm with no murmurs. Inspection Jugular vein - Bilateral - Inspection Normal.  Abdomen Inspection Contour - Obese and Pannus present. Palpation/Percussion Normal exam - Non Tender and No hepatosplenomegaly. Auscultation Normal exam - Bowel sounds normal.  Peripheral Vascular Lower Extremity Inspection - Left - No Pigmentation, No Varicose veins. Right - No Pigmentation, No Varicose veins. Palpation - Edema - Left - No edema. Right - No edema. Popliteal pulse - Bilateral - 1+(Pulsus difficult to feel due to patient's bodily habitus.). Dorsalis pedis pulse - Bilateral - 1+. Posterior tibial pulse - Bilateral - 1+. Carotid arteries - Bilateral-Harsh Bruit. Abdomen-No prominent abdominal aortic pulsation, No epigastric bruit.  Neurologic Motor-Grossly intact without any focal deficits.  Musculoskeletal Global Assessment Left Lower Extremity - normal range of motion without pain. Right Lower Extremity - normal range of motion without pain.    Assessment & Plan Laverda Page MD; 05/26/2017 9:28 PM) Coronary artery disease involving native coronary artery of native heart with angina pectoris (I25.119) Story: Exercise sestamibi 04/03/11: 3 min, 3.7 METs. Strongly  +ve ischemic EKG change. Inferior scar noted. EF 36%.  Heart cath 05/12/11: Unsuccessful attempt at PCI circumflex and RI. 99% stenosis. Medical therpy only. Heart cath 04/21/11 (report) LM stent 2004. LIMA to LAD, SVG to  PDA patent. RI and Circ 99% Impression: EKG 05/24/2017: Normal sinus rhythm at rate of 83 bpm, left axis deviation, left anterior fascicular block. Poor R-wave progression, cannot exclude anteroseptal infarct old. QRS widening, LVH with repolarization abnormality, cannot exclude lateral ischemia. No significant change from EKG 10/09/2016. Current Plans Complete electrocardiogram (93000) Started Bisoprolol Fumarate '5MG'$ , 1 (one) Tablet daily, #30, 30 days starting 05/24/2017, Ref. x1. Asymptomatic bilateral carotid artery stenosis (N82.95) Story: Carotid artery duplex 04/28/2017: Stenosis in the right internal carotid artery (>=70%). Mild stenosis in the right external carotid artery (<50%). Stenosis in the left internal carotid artery (>=70%). The PSV internal/common carotid artery ratio is consistent with a stenosis of >70% bilaterally. Antegrade right vertebral artery flow. Antegrade left vertebral artery flow. Follow up in six months is appropriate if clinically indicated. Compared to the study done on 10/23/2016, no significant change. Future Plans 10/27/2017: Complete duplex scan of bilateral carotid arteries (62130) - one time Heart failure, systolic and diastolic, chronic (Q65.78) Story: Echocardiogram 12/18/2014: Left ventricle cavity is normal in size. Moderate decrease in global wall motion. Doppler evidence of grade II (pseudonormal) diastolic dysfunction. Diastolic dysfunction findings suggests elevated LA/LV end diastolic pressure. Visual EF is 35-40%. Calculated EF 39%. Left atrial cavity is mildly dilated. Mild mitral regurgitation. Trace tricuspid regurgitation. No evidence of pulmonary hypertension. Compared to the study done on 07/13/2013, no significant  change. Future Plans 06/02/2017: Echocardiography, transthoracic, real-time with image documentation (2D), includes M-mode recording, when performed, complete, with spectral Doppler echocardiography, and with color flow Doppler echocardiography (46962) - one time 06/04/2017: CBC & PLATELETS (AUTO) (95284) - one time 04/08/2438: METABOLIC PANEL, BASIC (10272) - one time 06/04/2017: PT (PROTHROMBIN TIME) (53664) - one time Mixed hyperlipidemia (E78.2) DM (diabetes mellitus) type II, controlled, with peripheral vascular disorder (E11.51) Labwork Story: 05/20/2017: Vitamin B12 normal. Hemoglobin A1c 6.1%. Cholesterol 220, HDL 56, triglycerides 108, LDL 142. CBC normal. Potassium 5.0, EGFR 38/33, creatinine 1.5, CMP otherwise normal.  10/09/2015: Vitamin B12 385, HbA1c 6.0%, creatinine 1.04, potassium 4.2, CMP normal  06/07/2015: HbA1c 6.0%, TSH 2.18, vitamin D 34, HB 10.7/HCT 33.8 with normocytic indices, serum glucose 105, creatinine 1.10, eGFR 57, potassium 4.3, CMP normal, total cholesterol 170, triglycerides 121, HDL 58, LDL 88  09/06/2014: HbA1c 6.3%, creatinine 1.20, eGFR 45, potassium 4.7, CMP otherwise normal  03/27/2014: HbA1c 6.2%, TSH 1.490, total cholesterol 175, HDL 55, LDL 76, triglycerides 219, creatinine 1.12, CMP otherwise normal, CBC normal  08/09/2013: LFT normal. Total cholesterol 239, triglycerides 151, HDL 66, LDL 143, BNP 128, BUN 33, serum creatinine 1.26, eGFR 49 mL. Potassium was 5.2. Labs suggest mild renal insufficiency.  07/18/2012: Total cholesterol 146, triglycerides 118, HDL 47, LDL 75. LDL particle # 961. LP-IR score 58, HbA1c 6.3% , PTH 39. Blood sugar 95, BUN 18, serum creatinine 1.03. eGFR 55 mL. CBC within normal limits except for mildly elevated platelets of 445, TSH 1.480.  Note:-  Recommendations:  Ms. Diamonds Lippard is a pleasant AA female with a history of known coronary artery disease with CABG in 1997 with LIMA to LAD and SVG to RCA and has high-grade OM1 stenosis,  being medically managed, nonischemic cardiomyopathy, decreased EF of 35%, out of proportion to CAD. She has bilateral asymptomatic high grade (70% by duplex) carotid stenosis, hyperlipidemia, asthmatic bronchitis and hypertension.  She now presents here for worsening symptoms of angina pectoris and is very tearful that she is unable to do even routine activities without having to stop frequently due to chest tightness and also mild  worsening dyspnea. In view of her symptoms, I have recommended that we proceed with cardiac catheterization and reattempt angioplasty if necessary. She has had a high-grade circumflex disease that wasn't amenable for PCI to the acute angle in the past. She also had left subclavian stenosis during angiography in 2013. This was moderate. If high-grade, may explain some symptoms of angina and may benefit from some and angioplasty, this has been explained to the patient. In view of asthmatic bronchitis, I have added bisoprolol fumarate 5 g daily which is highly selective.  Carotid artery stenosis is high-grade around greater than 70% but remains stable over the past one year, hence continued surveillance. She will need repeat echocardiogram as it is been greater than 2 years since last echocardiogram for follow-up of cardiomyopathy.  Lipids continue to be uncontrolled, in view of multiple vascular issues, she will benefit from being on PCSK 9 inhibitors, recent labs that was performed by Dr. Baird Cancer reviewed. This was a greater than 40 minute office visit with greater than 50% of the time spent with face-to-face encounter with patient and evaluation of complex medical issues, review of external records and coordination of care.  CC Dr. Glendale Chard. Addendum Note(Jagadeesh Carlynn Herald MD; 06/03/2017 10:53 PM) Echocardiogram 06/02/2017: Left ventricle cavity is normal in size. Mild concentric hypertrophy of the left ventricle. Abnormal septal wall motion due to left bundle  branch block. Severe global hypokinesis. Doppler evidence of grade II (pseudonormal) diastolic dysfunction, elevated LAP. Calculated EF 20%. Left atrial cavity is mildly dilated. Aneurysmal interatrial septum without PFO. Inadequate tricuspid regurgitation jet to estimate pulmonary artery pressure. Normal right atrial pressure. Compared to previous study dated 12/18/2014, LVEF is significantly reduced.     Signed by Laverda Page, MD (05/26/2017 9:29 PM)

## 2017-06-11 ENCOUNTER — Encounter (HOSPITAL_COMMUNITY): Payer: Self-pay

## 2017-06-11 ENCOUNTER — Ambulatory Visit (HOSPITAL_COMMUNITY): Admit: 2017-06-11 | Payer: Medicare HMO | Admitting: Cardiology

## 2017-06-11 SURGERY — LEFT HEART CATH AND CORONARY ANGIOGRAPHY
Anesthesia: LOCAL

## 2017-06-13 ENCOUNTER — Encounter (HOSPITAL_COMMUNITY): Payer: Self-pay | Admitting: Cardiology

## 2017-06-13 NOTE — H&P (View-Only) (Signed)
I also called patient's daughter and discussed the reasons for angiogram and risks and benefits. Patient severely limited by anginal symptoms and on maximal medical therapy.    Adrian Prows, MD 06/13/2017, 6:21 PM [facility]. [place] Pager: [phone] Office: [phone] If no answer: Cell:  [phone]

## 2017-06-13 NOTE — Progress Notes (Signed)
I also called patient's daughter and discussed the reasons for angiogram and risks and benefits. Patient severely limited by anginal symptoms and on maximal medical therapy.    Adrian Prows, MD 06/13/2017, 6:21 PM [facility]. [place] Pager: [phone] Office: [phone] If no answer: Cell:  [phone]

## 2017-06-18 ENCOUNTER — Encounter (HOSPITAL_COMMUNITY)
Admission: RE | Disposition: A | Discharge status: Home / Self Care | Payer: Self-pay | Source: Ambulatory Visit | Attending: Cardiology

## 2017-06-18 ENCOUNTER — Ambulatory Visit (HOSPITAL_COMMUNITY)
Admission: RE | Admit: 2017-06-18 | Discharge: 2017-06-18 | Disposition: A | Discharge status: Home / Self Care | Payer: Medicare HMO | Source: Ambulatory Visit | Attending: Cardiology | Admitting: Cardiology

## 2017-06-18 DIAGNOSIS — J45909 Unspecified asthma, uncomplicated: Secondary | ICD-10-CM | POA: Insufficient documentation

## 2017-06-18 DIAGNOSIS — E785 Hyperlipidemia, unspecified: Secondary | ICD-10-CM | POA: Insufficient documentation

## 2017-06-18 DIAGNOSIS — E119 Type 2 diabetes mellitus without complications: Secondary | ICD-10-CM | POA: Insufficient documentation

## 2017-06-18 DIAGNOSIS — I6523 Occlusion and stenosis of bilateral carotid arteries: Secondary | ICD-10-CM | POA: Insufficient documentation

## 2017-06-18 DIAGNOSIS — I428 Other cardiomyopathies: Secondary | ICD-10-CM | POA: Diagnosis not present

## 2017-06-18 DIAGNOSIS — I2582 Chronic total occlusion of coronary artery: Secondary | ICD-10-CM | POA: Insufficient documentation

## 2017-06-18 DIAGNOSIS — Z955 Presence of coronary angioplasty implant and graft: Secondary | ICD-10-CM

## 2017-06-18 DIAGNOSIS — I25719 Atherosclerosis of autologous vein coronary artery bypass graft(s) with unspecified angina pectoris: Secondary | ICD-10-CM | POA: Diagnosis not present

## 2017-06-18 DIAGNOSIS — I209 Angina pectoris, unspecified: Secondary | ICD-10-CM | POA: Diagnosis present

## 2017-06-18 DIAGNOSIS — I200 Unstable angina: Secondary | ICD-10-CM | POA: Diagnosis present

## 2017-06-18 HISTORY — PX: LEFT HEART CATH AND CORS/GRAFTS ANGIOGRAPHY: CATH118250

## 2017-06-18 HISTORY — DX: Acute myocardial infarction, unspecified: I21.9

## 2017-06-18 HISTORY — PX: CORONARY STENT INTERVENTION: CATH118234

## 2017-06-18 LAB — POCT ACTIVATED CLOTTING TIME: Activated Clotting Time: 246 seconds

## 2017-06-18 LAB — GLUCOSE, CAPILLARY: Glucose-Capillary: 110 mg/dL — ABNORMAL HIGH (ref 65–99)

## 2017-06-18 SURGERY — LEFT HEART CATH AND CORS/GRAFTS ANGIOGRAPHY
Anesthesia: LOCAL | Laterality: Right

## 2017-06-18 MED ORDER — SODIUM CHLORIDE 0.9% FLUSH: 3.0000 mL | INTRAVENOUS | Status: DC | PRN

## 2017-06-18 MED ORDER — CLOPIDOGREL BISULFATE 300 MG PO TABS
ORAL_TABLET | ORAL | Status: AC
  Filled 2017-06-18: qty 1

## 2017-06-18 MED ORDER — IOPAMIDOL (ISOVUE-370) INJECTION 76%
INTRAVENOUS | Status: AC
  Filled 2017-06-18: qty 125

## 2017-06-18 MED ORDER — PANTOPRAZOLE SODIUM 40 MG PO TBEC: 40.0000 mg | DELAYED_RELEASE_TABLET | Freq: Every day | ORAL | 1 refills | Status: DC

## 2017-06-18 MED ORDER — LIDOCAINE-EPINEPHRINE 1 %-1:100000 IJ SOLN
INTRAMUSCULAR | Status: AC
  Filled 2017-06-18: qty 1

## 2017-06-18 MED ORDER — HEPARIN (PORCINE) IN NACL 2-0.9 UNIT/ML-% IJ SOLN
INTRAMUSCULAR | Status: AC
  Filled 2017-06-18: qty 1000

## 2017-06-18 MED ORDER — LIDOCAINE HCL (PF) 1 % IJ SOLN
INTRAMUSCULAR | Status: DC | PRN
  Administered 2017-06-18: 20 mL

## 2017-06-18 MED ORDER — FENTANYL CITRATE (PF) 100 MCG/2ML IJ SOLN
INTRAMUSCULAR | Status: DC | PRN
  Administered 2017-06-18: 25 ug via INTRAVENOUS

## 2017-06-18 MED ORDER — LIDOCAINE-EPINEPHRINE 1 %-1:100000 IJ SOLN
INTRAMUSCULAR | Status: DC | PRN
  Administered 2017-06-18: 8 mL

## 2017-06-18 MED ORDER — SODIUM CHLORIDE 0.9% FLUSH: 3.0000 mL | Freq: Two times a day (BID) | INTRAVENOUS | Status: DC

## 2017-06-18 MED ORDER — MIDAZOLAM HCL 2 MG/2ML IJ SOLN
INTRAMUSCULAR | Status: DC | PRN
  Administered 2017-06-18: 2 mg via INTRAVENOUS

## 2017-06-18 MED ORDER — LIDOCAINE HCL 1 % IJ SOLN
INTRAMUSCULAR | Status: AC
  Filled 2017-06-18: qty 20

## 2017-06-18 MED ORDER — ASPIRIN 81 MG PO CHEW: 81.0000 mg | CHEWABLE_TABLET | ORAL | Status: DC

## 2017-06-18 MED ORDER — CEFAZOLIN SODIUM-DEXTROSE 2-3 GM-%(50ML) IV SOLR
INTRAVENOUS | Status: AC | PRN
  Administered 2017-06-18: 2 g via INTRAVENOUS

## 2017-06-18 MED ORDER — FENTANYL CITRATE (PF) 100 MCG/2ML IJ SOLN
INTRAMUSCULAR | Status: AC
  Filled 2017-06-18: qty 2

## 2017-06-18 MED ORDER — SODIUM CHLORIDE 0.9 % IV SOLN: 250.0000 mL | INTRAVENOUS | Status: DC | PRN

## 2017-06-18 MED ORDER — SODIUM CHLORIDE 0.9 % IV SOLN: INTRAVENOUS | Status: AC

## 2017-06-18 MED ORDER — SODIUM CHLORIDE 0.9 % IV SOLN
INTRAVENOUS | Status: DC
  Administered 2017-06-18: 06:00:00 via INTRAVENOUS

## 2017-06-18 MED ORDER — HEPARIN (PORCINE) IN NACL 2-0.9 UNIT/ML-% IJ SOLN
INTRAMUSCULAR | Status: AC | PRN
  Administered 2017-06-18 (×2): 500 mL

## 2017-06-18 MED ORDER — CLOPIDOGREL BISULFATE 300 MG PO TABS
ORAL_TABLET | ORAL | Status: DC | PRN
  Administered 2017-06-18: 600 mg via ORAL

## 2017-06-18 MED ORDER — CEFAZOLIN SODIUM-DEXTROSE 2-4 GM/100ML-% IV SOLN
INTRAVENOUS | Status: AC
  Filled 2017-06-18: qty 100

## 2017-06-18 MED ORDER — HEPARIN SODIUM (PORCINE) 1000 UNIT/ML IJ SOLN
INTRAMUSCULAR | Status: AC
  Filled 2017-06-18: qty 1

## 2017-06-18 MED ORDER — HEPARIN SODIUM (PORCINE) 1000 UNIT/ML IJ SOLN
INTRAMUSCULAR | Status: DC | PRN
  Administered 2017-06-18: 6000 [IU] via INTRAVENOUS
  Administered 2017-06-18: 1000 [IU] via INTRAVENOUS

## 2017-06-18 MED ORDER — MIDAZOLAM HCL 2 MG/2ML IJ SOLN
INTRAMUSCULAR | Status: AC
  Filled 2017-06-18: qty 2

## 2017-06-18 MED ORDER — CLOPIDOGREL BISULFATE 75 MG PO TABS: 75.0000 mg | ORAL_TABLET | Freq: Every day | ORAL | 11 refills | Status: AC

## 2017-06-18 MED ORDER — VERAPAMIL HCL 2.5 MG/ML IV SOLN
INTRAVENOUS | Status: AC
  Filled 2017-06-18: qty 2

## 2017-06-18 MED ORDER — IOPAMIDOL (ISOVUE-370) INJECTION 76%
INTRAVENOUS | Status: DC | PRN
  Administered 2017-06-18: 75 mL via INTRA_ARTERIAL

## 2017-06-18 SURGICAL SUPPLY — 19 items
BALLN SAPPHIRE ~~LOC~~ 4.0X8 (BALLOONS) ×1 IMPLANT
CATH INFINITI 5 FR RCB (CATHETERS) ×1 IMPLANT
CATH INFINITI 5FR MULTPACK ANG (CATHETERS) ×1 IMPLANT
CATH LAUNCHER 5F AL1 (CATHETERS) IMPLANT
CATHETER LAUNCHER 5F AL1 (CATHETERS) ×3
DEVICE CLOSURE PERCLS PRGLD 6F (VASCULAR PRODUCTS) IMPLANT
DEVICE SPIDERFX EMB PROT 4MM (WIRE) ×1 IMPLANT
KIT ENCORE 26 ADVANTAGE (KITS) ×1 IMPLANT
KIT HEART LEFT (KITS) ×3 IMPLANT
KIT HEMO VALVE WATCHDOG (MISCELLANEOUS) ×1 IMPLANT
KIT MICROPUNCTURE NIT STIFF (SHEATH) ×1 IMPLANT
PACK CARDIAC CATHETERIZATION (CUSTOM PROCEDURE TRAY) ×3 IMPLANT
PERCLOSE PROGLIDE 6F (VASCULAR PRODUCTS) ×3
SHEATH AVANTI 11CM 5FR (SHEATH) ×1 IMPLANT
STENT RESOLUTE ONYX 4.0X15 (Permanent Stent) ×1 IMPLANT
TRANSDUCER W/STOPCOCK (MISCELLANEOUS) ×3 IMPLANT
TUBING CIL FLEX 10 FLL-RA (TUBING) ×3 IMPLANT
WIRE ASAHI SOFT 180CM (WIRE) IMPLANT
WIRE EMERALD 3MM-J .035X150CM (WIRE) ×1 IMPLANT

## 2017-06-18 NOTE — Progress Notes (Signed)
Symptom Status: Ischemic Symptoms Non-invasive Testing: Not done If no or indeterminate stress test, FFR/iFR results in all diseased vessels: Not done Diabetes Mellitus: Yes S/P CABG: Yes Antianginal therapy (number of long-acting drugs): >=2 Patient undergoing renal transplant: No Patient undergoing percutaneous valve procedure: No  LIMA-LAD patent and without significant stenoses Stenosis supplying 1 territory (bypass graft or native artery) other than anterior  PCI: Not rated  CABG: Not rated Stenoses supplying 2 territories (bypass graft or native artery, either 2 separate vessels or sequential graft supplying 2 territories) not including anterior territory  PCI: Not rated  CABG: Not rated  LIMA-LAD not patent Stenosis supplying 1 territory (bypass graft or native artery)-anterior (LAD) territory  PCI: Not rated  CABG: Not rated Stenoses supplying 2 territories (bypass graft or native artery, either 2 separate vessels or sequential graft supplying 2 territories)-LAD plus other territory  PCI: Not rated  CABG: Not rated Stenoses supplying 3 territories (bypass graft or native arteries, separate vessels, sequential grafts, or combination thereof)-LAD plus 2 other territories  PCI: Not rated  CABG: Not rated  Notes:  A indicates appropriate. M indicates may be appropriate. R indicates rarely appropriate. Number in parentheses is median score for that indication. Reclassify indicates number of functionally diseased vessels should be decreased given negative FFR/iFR. Re-evaluate the scenario interpreting any FFR/iFR negative vessel as being not significantly stenosed.  Journal of the [employer] of Cardiology Mar 2017, 23391; DOI: 10.1016/[email or web address] [email or web address].full-text.pdf This App  2018 by the Society for Cardiovascular Angiography and Interventions

## 2017-06-18 NOTE — Interval H&P Note (Signed)
History and Physical Interval Note:  06/18/2017 7:45 AM  Lynn Carey  has presented today for surgery, with the diagnosis of cad, cp, angina  The various methods of treatment have been discussed with the patient and family. After consideration of risks, benefits and other options for treatment, the patient has consented to  Procedure(s): LEFT HEART CATH AND CORS/GRAFTS ANGIOGRAPHY (N/A) and possible angioplasty  as a surgical intervention .  The patient's history has been reviewed, patient examined, no change in status, stable for surgery.  I have reviewed the patient's chart and labs.  Questions were answered to the patient's satisfaction.   Symptom Status: Ischemic Symptoms Non-invasive Testing: Not done If no or indeterminate stress test, FFR/iFR results in all diseased vessels: Not done Diabetes Mellitus: Yes S/P CABG: No Antianginal therapy (number of long-acting drugs): >=2 Patient undergoing renal transplant: No Patient undergoing percutaneous valve procedure: No   1 Vessel Disease No proximal LAD involvement, No proximal left dominant LCX involvement  PCI: Not rated  CABG: Not rated Proximal left dominant LCX involvement  PCI: Not rated  CABG: Not rated Proximal LAD involvement  PCI: Not rated  CABG: Not rated  2 Vessel Disease No proximal LAD involvement  PCI: Not rated  CABG: Not rated Proximal LAD involvement  PCI: Not rated  CABG: Not rated  3 Vessel Disease Low disease complexity (e.g., focal stenoses, SYNTAX <=22)  PCI: Not rated  CABG: Not rated Intermediate or high disease complexity (e.g., SYNTAX >=23)  PCI: Not rated  CABG: Not rated  Left Main Disease Isolated LMCA disease: ostial or midshaft  PCI: A (7);  Indication 24  CABG: A (9);  Indication 24 Isolated LMCA disease: bifurcation involvement  PCI: M (6);  Indication 25  CABG: A (9);  Indication 25 LMCA ostial or midshaft, concurrent low disease burden multivessel disease (e.g., 1-2  additional focal stenoses, SYNTAX <=22)  PCI: A (7);  Indication 26  CABG: A (9);  Indication 26 LMCA ostial or midshaft, concurrent intermediate or high disease burden multivessel disease (e.g., 1-2 additional bifurcation stenoses, long stenoses, SYNTAX >=23)  PCI: M (4);  Indication 27  CABG: A (9);  Indication 27 LMCA bifurcation involvement, concurrent low disease burden multivessel disease (e.g., 1-2 additional focal stenoses, SYNTAX <=22)  PCI: M (6);  Indication 28  CABG: A (9);  Indication 28 LMCA bifurcation involvement, concurrent intermediate or high disease burden multivessel disease (e.g., 1-2 additional bifurcation stenoses, long stenoses, SYNTAX >=23)  PCI: R (3);  Indication 29  CABG: A (9);  Indication 29  Notes:  A indicates appropriate. M indicates may be appropriate. R indicates rarely appropriate. Number in parentheses is median score for that indication. Reclassify indicates number of functionally diseased vessels should be decreased given negative FFR/iFR. Re-evaluate the scenario interpreting any FFR/iFR negative vessel as being not significantly stenosed.  Disease means involved vessel provides flow to a sufficient amount of myocardium to be clinically important.  If FFR testing indicates a vessel is not significant, that vessel should not be considered diseased (and the patient should be reclassified with respect to extent of functionally significant disease).  Proximal LAD + proximal left dominant LCX is considered 3 vessel CAD  2 Vessel CAD with FFR/iFR abnormal in only 1 but not both is considered 1 vessel CAD  Disease complexity includes occlusion, bifurcation, trifurcation, ostial, >20 mm, tortuosity, calcification, thrombus  LMCA disease is >=50% by angiography, MLD <2.8 mm, MLA <6 mm2; MLA 6-7.5 mm2 requires further physiologic  See Table B for risk stratification based on noninvasive testing  Journal of the [employer] of Cardiology Mar 2017, 23391; DOI:  10.1016/j.jacc.2017.02.001 [employer].de.2017.02.001.full-text.pdf This App  2018 by the Society for Cardiovascular Angiography and Interventions    Adrian Prows

## 2017-06-18 NOTE — Discharge Instructions (Signed)

## 2017-06-18 NOTE — Progress Notes (Signed)
[id number] Discussed with pt and daughter importance of plavix with stent. Gave heart healthy and diabetic diets. Discussed NTG use,risk factors, ex ed and CRP 2. Will refer to [place] program. Graylon Good RN BSN 06/18/2017 1:51 PM

## 2017-06-18 NOTE — CV Procedure (Signed)
Procedure performed: Left heart catheterization, selective left and right coronary angiogram, saphenous vein graft and left internal mammary arteriogram, PTCA and stenting of the SVG to RCA ostial 90% stenosis.  Right femoral arteriogram and closure of right femoral arterial access with Perclose.  Indication: Patient with class III-IV angina pectoris in spite of aggressive medical therapy, hence brought to the cardiac catheterization lab to evaluate coronary anatomy.  She has ischemic cardiomyopathy.  No stress testing was performed.  LVEDP was 23 mmHg.  Left main is occluded.  LIMA to LAD widely patent, mild disease in the LAD, large LAD.  Circumflex is occluded.  RCA is occluded in the proximal segment.  SVG to RCA is patent however there is a ostial 80% stenosis.  Successful direct stenting with 4.0 x 15 mm resolute Onyx DES, postdilated with a 4.0 x 8 mm sapphire [place] at 20 atmospheric pressure.  Stenosis reduced from 80% to 0%.  Recommendation: Patient will be observed until 4 PM, and will be discharged home with outpatient follow-up.  75 ml contrast utilized.

## 2017-06-21 ENCOUNTER — Encounter (HOSPITAL_COMMUNITY): Payer: Self-pay | Admitting: Cardiology

## 2017-06-21 MED FILL — Lidocaine HCl Local Inj 1%: INTRAMUSCULAR | Qty: 20 | Status: AC

## 2017-06-21 MED FILL — Heparin Sodium (Porcine) 2 Unit/ML in Sodium Chloride 0.9%: INTRAMUSCULAR | Qty: 1000 | Status: AC

## 2017-06-23 ENCOUNTER — Telehealth (HOSPITAL_COMMUNITY): Payer: Self-pay

## 2017-06-23 NOTE — Telephone Encounter (Signed)
Patients insurance is active and benefits verified through [facility] - $10.00 co-pay, no deductible, out of pocket amount of $3,400/$105.00 has been met, no co-insurance, and no pre-authorization is required. Passport/reference [phone]  Patient will be contacted for scheduling upon review by the RN Navigator.

## 2017-06-25 DIAGNOSIS — E1121 Type 2 diabetes mellitus with diabetic nephropathy: Secondary | ICD-10-CM | POA: Diagnosis not present

## 2017-06-25 DIAGNOSIS — Z1211 Encounter for screening for malignant neoplasm of colon: Secondary | ICD-10-CM | POA: Diagnosis not present

## 2017-06-25 DIAGNOSIS — Z1231 Encounter for screening mammogram for malignant neoplasm of breast: Secondary | ICD-10-CM | POA: Diagnosis not present

## 2017-06-28 DIAGNOSIS — E782 Mixed hyperlipidemia: Secondary | ICD-10-CM | POA: Diagnosis not present

## 2017-06-28 DIAGNOSIS — I5042 Chronic combined systolic (congestive) and diastolic (congestive) heart failure: Secondary | ICD-10-CM | POA: Diagnosis not present

## 2017-06-28 DIAGNOSIS — I2510 Atherosclerotic heart disease of native coronary artery without angina pectoris: Secondary | ICD-10-CM | POA: Diagnosis not present

## 2017-06-28 DIAGNOSIS — I10 Essential (primary) hypertension: Secondary | ICD-10-CM | POA: Diagnosis not present

## 2017-07-06 ENCOUNTER — Telehealth (HOSPITAL_COMMUNITY): Payer: Self-pay

## 2017-07-06 NOTE — Telephone Encounter (Signed)
Attempted to call patient in regards to Cardiac Rehab - lm on vm °

## 2017-07-09 ENCOUNTER — Other Ambulatory Visit: Payer: Self-pay | Admitting: Internal Medicine

## 2017-07-09 DIAGNOSIS — Z1231 Encounter for screening mammogram for malignant neoplasm of breast: Secondary | ICD-10-CM

## 2017-07-12 NOTE — Progress Notes (Signed)
Patient is on Bisoprolol on the out patient basis and this was left out by oversight. She will be continued on Bisoprolol and patient will be informed.  Adrian Prows

## 2017-07-14 ENCOUNTER — Telehealth (HOSPITAL_COMMUNITY): Payer: Self-pay

## 2017-07-14 NOTE — Telephone Encounter (Signed)
Called and spoke with patient in regards to Cardiac Rehab - Patient stated she is already exercising and will not participate at this time. Patient stated she will call if she decides to change her mind. Closed referral.

## 2017-07-29 ENCOUNTER — Ambulatory Visit
Admission: RE | Admit: 2017-07-29 | Discharge: 2017-07-29 | Disposition: A | Discharge status: Home / Self Care | Payer: Medicare HMO | Source: Ambulatory Visit | Attending: Internal Medicine | Admitting: Internal Medicine

## 2017-07-29 DIAGNOSIS — Z1231 Encounter for screening mammogram for malignant neoplasm of breast: Secondary | ICD-10-CM | POA: Diagnosis not present

## 2017-08-12 DIAGNOSIS — H524 Presbyopia: Secondary | ICD-10-CM | POA: Diagnosis not present

## 2017-08-12 DIAGNOSIS — H5203 Hypermetropia, bilateral: Secondary | ICD-10-CM | POA: Diagnosis not present

## 2017-08-12 DIAGNOSIS — H52209 Unspecified astigmatism, unspecified eye: Secondary | ICD-10-CM | POA: Diagnosis not present

## 2017-08-17 ENCOUNTER — Encounter: Payer: Self-pay | Admitting: Internal Medicine

## 2017-08-24 DIAGNOSIS — H25813 Combined forms of age-related cataract, bilateral: Secondary | ICD-10-CM | POA: Diagnosis not present

## 2017-08-24 DIAGNOSIS — Z01818 Encounter for other preprocedural examination: Secondary | ICD-10-CM | POA: Diagnosis not present

## 2017-08-24 DIAGNOSIS — H35371 Puckering of macula, right eye: Secondary | ICD-10-CM | POA: Diagnosis not present

## 2017-08-24 DIAGNOSIS — H25812 Combined forms of age-related cataract, left eye: Secondary | ICD-10-CM | POA: Diagnosis not present

## 2017-09-02 DIAGNOSIS — I10 Essential (primary) hypertension: Secondary | ICD-10-CM | POA: Diagnosis not present

## 2017-09-02 DIAGNOSIS — E782 Mixed hyperlipidemia: Secondary | ICD-10-CM | POA: Diagnosis not present

## 2017-09-02 DIAGNOSIS — I5042 Chronic combined systolic (congestive) and diastolic (congestive) heart failure: Secondary | ICD-10-CM | POA: Diagnosis not present

## 2017-09-02 DIAGNOSIS — I2510 Atherosclerotic heart disease of native coronary artery without angina pectoris: Secondary | ICD-10-CM | POA: Diagnosis not present

## 2017-09-07 DIAGNOSIS — E1121 Type 2 diabetes mellitus with diabetic nephropathy: Secondary | ICD-10-CM | POA: Diagnosis not present

## 2017-09-07 DIAGNOSIS — J4520 Mild intermittent asthma, uncomplicated: Secondary | ICD-10-CM | POA: Diagnosis not present

## 2017-09-07 DIAGNOSIS — E559 Vitamin D deficiency, unspecified: Secondary | ICD-10-CM | POA: Diagnosis not present

## 2017-09-07 DIAGNOSIS — R05 Cough: Secondary | ICD-10-CM | POA: Diagnosis not present

## 2017-09-07 DIAGNOSIS — I7090 Unspecified atherosclerosis: Secondary | ICD-10-CM | POA: Diagnosis not present

## 2017-09-07 DIAGNOSIS — I1310 Hypertensive heart and chronic kidney disease without heart failure, with stage 1 through stage 4 chronic kidney disease, or unspecified chronic kidney disease: Secondary | ICD-10-CM | POA: Diagnosis not present

## 2017-09-07 DIAGNOSIS — N182 Chronic kidney disease, stage 2 (mild): Secondary | ICD-10-CM | POA: Diagnosis not present

## 2017-09-07 DIAGNOSIS — E1165 Type 2 diabetes mellitus with hyperglycemia: Secondary | ICD-10-CM | POA: Diagnosis not present

## 2017-09-07 DIAGNOSIS — J309 Allergic rhinitis, unspecified: Secondary | ICD-10-CM | POA: Diagnosis not present

## 2017-09-13 DIAGNOSIS — H25812 Combined forms of age-related cataract, left eye: Secondary | ICD-10-CM | POA: Diagnosis not present

## 2017-09-13 DIAGNOSIS — H25811 Combined forms of age-related cataract, right eye: Secondary | ICD-10-CM | POA: Diagnosis not present

## 2017-09-16 DIAGNOSIS — I10 Essential (primary) hypertension: Secondary | ICD-10-CM | POA: Diagnosis not present

## 2017-09-28 DIAGNOSIS — J309 Allergic rhinitis, unspecified: Secondary | ICD-10-CM | POA: Diagnosis not present

## 2017-09-29 DIAGNOSIS — H25811 Combined forms of age-related cataract, right eye: Secondary | ICD-10-CM | POA: Diagnosis not present

## 2017-09-29 DIAGNOSIS — H2511 Age-related nuclear cataract, right eye: Secondary | ICD-10-CM | POA: Diagnosis not present

## 2017-10-12 DIAGNOSIS — H35371 Puckering of macula, right eye: Secondary | ICD-10-CM | POA: Diagnosis not present

## 2017-10-19 DIAGNOSIS — H35371 Puckering of macula, right eye: Secondary | ICD-10-CM | POA: Diagnosis not present

## 2017-10-19 DIAGNOSIS — H43823 Vitreomacular adhesion, bilateral: Secondary | ICD-10-CM | POA: Diagnosis not present

## 2017-11-05 DIAGNOSIS — E782 Mixed hyperlipidemia: Secondary | ICD-10-CM | POA: Diagnosis not present

## 2017-11-08 DIAGNOSIS — I2510 Atherosclerotic heart disease of native coronary artery without angina pectoris: Secondary | ICD-10-CM | POA: Diagnosis not present

## 2017-11-08 DIAGNOSIS — I10 Essential (primary) hypertension: Secondary | ICD-10-CM | POA: Diagnosis not present

## 2017-11-08 DIAGNOSIS — I5042 Chronic combined systolic (congestive) and diastolic (congestive) heart failure: Secondary | ICD-10-CM | POA: Diagnosis not present

## 2017-11-08 DIAGNOSIS — E782 Mixed hyperlipidemia: Secondary | ICD-10-CM | POA: Diagnosis not present

## 2017-11-09 DIAGNOSIS — H26491 Other secondary cataract, right eye: Secondary | ICD-10-CM | POA: Diagnosis not present

## 2017-11-23 DIAGNOSIS — I10 Essential (primary) hypertension: Secondary | ICD-10-CM | POA: Diagnosis not present

## 2017-11-23 DIAGNOSIS — I447 Left bundle-branch block, unspecified: Secondary | ICD-10-CM | POA: Diagnosis not present

## 2017-11-23 DIAGNOSIS — I200 Unstable angina: Secondary | ICD-10-CM | POA: Diagnosis not present

## 2017-11-23 DIAGNOSIS — I2510 Atherosclerotic heart disease of native coronary artery without angina pectoris: Secondary | ICD-10-CM | POA: Diagnosis not present

## 2017-11-24 NOTE — H&P (Signed)
Labs 11/24/2017: H/H 10.7/32.5. MCV 92. Platelets 367 Glucose 109. BUN?Cr 21/1.4. eGFR 42. Na/K 140/4.2

## 2017-11-24 NOTE — H&P (Deleted)
[facility] 11/24/2017 8:56 AM Location: [facility] Patient #: [id number] DOB: 01-03-1941 Divorced / Language: [name] / Race: Black or African American Female   History of Present Illness [name] MD; 11/24/2017 9:07 AM) The patient is a 77 year old female who presents with chest pain. 77 year old African-American female with coronary artery disease status post CABG 1997, PCI to SVG with 4.0 x 15 mm resolute stent in 06/2017, controlled hypertension, hyperlipidemia, ischemic cardio myopathy EF 30%, stable bilateral high-grade carotid stenosis.  Patient presented to our office today with complaints of exertional chest pain and shortness of breath for last 10 days. Patient had been doing well until a week and a half ago when she reports that she had a upper respiratory viral illness. Since then, she has been having chest pain with minimal exertion. Symptoms are identical to her. His symptoms prior to her PCI in 06/2017. She denies any fever, chills, cough, pleuritic chest pain. She is on excellent medical therapy and compliant with it.    Review of Systems Joya Gaskins Esther Hardy MD; 11/24/2017 9:07 AM) General Not Present- Appetite Loss and Weight Gain. Respiratory Not Present- Chronic Cough and Wakes up from Sleep Wheezing or Short of Breath. Cardiovascular Present- Chest Pain, Difficulty Breathing On Exertion and Hypertension. Not Present- Claudications, Edema and Orthopnea. Gastrointestinal Not Present- Black, Tarry Stool and Difficulty Swallowing. Musculoskeletal Not Present- Decreased Range of Motion and Muscle Atrophy. Neurological Not Present- Attention Deficit. Psychiatric Not Present- Personality Changes and Suicidal Ideation. Endocrine Not Present- Cold Intolerance and Heat Intolerance. Hematology Not Present- Abnormal Bleeding. All other systems negative   Physical Exam [name] MD; 11/24/2017 9:08 AM) General Mental  Status-Alert. General Appearance-Cooperative and Appears stated age. Build & Nutrition-Moderately built.  Head and Neck Thyroid Gland Characteristics - normal size and consistency and no palpable nodules.  Chest and Lung Exam Chest and lung exam reveals -quiet, even and easy respiratory effort with no use of accessory muscles, non-tender and on auscultation, normal breath sounds, no adventitious sounds.  Cardiovascular Cardiovascular examination reveals -normal heart sounds, regular rate and rhythm with no murmurs, carotid auscultation reveals no bruits, abdominal aorta auscultation reveals no bruits and no prominent pulsation, femoral artery auscultation bilaterally reveals normal pulses, no bruits, no thrills and normal pedal pulses bilaterally.  Abdomen Palpation/Percussion Palpation and Percussion of the abdomen reveal - Non Tender and No hepatosplenomegaly.  Peripheral Vascular Lower Extremity Palpation - Edema - Bilateral - No edema. Carotid arteries - Bilateral-Soft Bruit.  Neurologic Neurologic evaluation reveals -alert and oriented x 3 with no impairment of recent or remote memory. Motor-Grossly intact without any focal deficits.  Musculoskeletal Global Assessment Left Lower Extremity - no deformities, masses or tenderness, no known fractures. Right Lower Extremity - no deformities, masses or tenderness, no known fractures.    Assessment & Plan Joya Gaskins Esther Hardy MD; 11/24/2017 9:12 AM) Unstable angina (I20.0) Coronary artery disease involving native coronary artery of native heart with angina pectoris (I25.10) Story: Coronary angiogram 06/18/2017: Occluded LM/Cx. Patent LIMA to LAD. PTCA SVG to RCA prox 80% to 0% with 4.0x15 Xience DES. (CABG 1997). Occluded SVG to OM. Impression: Labs 06/28/2017: Sinus bradycardia at the rate of 59 bpm, borderline criteria for left atrial enlargement, leftward axis. T-wave inversion, cannot exclude anterolateral  ischemia. QRS widening, borderline criteria for LVH. No significant change from EKG 05/24/2017.. Asymptomatic bilateral carotid artery stenosis (B51.02) Story: Carotid artery duplex 04/28/2017: Stenosis in the right internal carotid artery (>=70%). Mild stenosis in the  right external carotid artery (<50%). Stenosis in the left internal carotid artery (>=70%). The PSV internal/common carotid artery ratio is consistent with a stenosis of >70% bilaterally. Antegrade right vertebral artery flow. Antegrade left vertebral artery flow. Follow up in six months is appropriate if clinically indicated. Compared to the study done on 10/23/2016, no significant change. 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 Mild concentric hypertrophy of the left ventricle. Abnormal septal wall motion due to left bundle branch block. Severe global hypokinesis. Doppler evidence of grade II (pseudonormal) diastolic dysfunction, elevated LAP. Calculated EF 20%. Left atrial cavity is mildly dilated. Aneurysmal interatrial septum without PFO. Inadequate tricuspid regurgitation jet to estimate pulmonary artery pressure. Normal right atrial pressure. Compared to previous study dated 12/18/2014, LVEF is significantly reduced. Future Plans 05/10/2018: Echocardiography, transthoracic, real-time with image documentation (2D), includes M-mode recording, when performed, complete, with spectral Doppler echocardiography, and with color flow Doppler echocardiography (54656) - one time Benign essential HTN (I10) CKD (chronic kidney disease) stage 3, GFR 30-59 ml/min (N18.3)  Note:Assessment/ Recommendations:  77 year old African-American female with coronary artery disease status post CABG 1997, PCI to SVG with 4.0 x 15 mm  resolute stent in 06/2017, controlled hypertension, hyperlipidemia, ischemic cardio myopathy EF 30%, stable bilateral high-grade carotid stenosis.  Chest pain: Accelerated unstable angina in patient with known CAD on excellent medical therapy. I discussed the options for stress test followed by possible coronary bypass graft angiography. Patient is leaving for [place] next week and says that she would rather burden proceeding with coronary and bypass graft angiography as she is very symptomatic. We'll arrange for coronary and bypass graft angiography with possible intervention this week. In the meantime, I'll increase her Ranexa to 1000 mg twice daily. Continue rest of the medical therapy.  Bialteral >70% carotid stenosis: Asymptomatic. She will need follow up carotid duplex. Continue aspirin, statin.  Ischemic cardiomyopathy: Stable. Euvolumic  Anemia: Stable to improved. No onging bleeding  Cc Glendale Chard, MD  Signed electronically by [name], MD (11/24/2017 9:13 AM

## 2017-11-24 NOTE — H&P (Signed)
Lynn Carey 11/23/2017 1:30 PM Location: [facility] Patient #: [id number] DOB: 1940/04/08 Divorced / Language: Lynn Carey / Race: Black or African American Female   History of Present Illness Lynn Mormon MD; 11/24/2017 12:54 PM) Patient words: Last O/V 11/08/2017; ACUTE Visit for SOB, Chest Tightness X2-3 days.  The patient is a 77 year old female who presents for a Follow-up for Coronary artery disease. Lynn Carey is a pleasant AA female with a history of known coronary artery disease with CABG in 1997 with LIMA to LAD and SVG to RCA and OM1 stenosis, due to class 3-4 symptoms of angina pectoris, underwent coronary angiography on 06/18/2017 and successful angioplasty to SVG to RCA. Left main/circumflex native vessels are occluded and she has patent LIMA to LAD. She also has Chronic systolic and diastolic CHF, EF 29-79% by echo in 05/2017, bilateral asymptomatic high grade (70% by duplex) carotid stenosis, hyperlipidemia, asthmatic bronchitis and hypertension. She is still working in [employer] in [employer] [employer].   Patient presents for acute visit today for exertional chest pain and dyspnea. Symptoms started 10 days ago after having GI illness. She has noticed symptoms of chest tightness and shortness of breath with doing minimal activities around her house and symptoms are similar to before last angiogram. She has not used nitroglycerin, but does use extra dose of Ranexa as needed. Denies any orthopnea, PND, or leg swelling. No cough.  Additional reasons for visit:  Carotid artery stenosis    Problem List/Past Medical Lynn Carey; 11/23/2017 1:20 PM) Atherosclerosis of native arteries of the extremities with intermittent claudication (440.21)  Coronary artery disease involving native coronary artery of native heart with angina pectoris (I25.10)  Coronary angiogram 06/18/2017: Occluded LM/Cx. Patent LIMA to LAD. PTCA SVG to RCA prox 80% to 0% with 4.0x15  Xience DES. (CABG 1997). Occluded SVG to OM. Postsurgical aortocoronary bypass status (Z95.1)  DM (diabetes mellitus) type II, controlled, with peripheral vascular disorder (E11.51)  Other and unspecified angina pectoris (413.9) (I20.9)  Mixed hyperlipidemia (E78.2)  Asymptomatic bilateral carotid artery stenosis (G92.11)  Carotid artery duplex 04/28/2017: Stenosis in the right internal carotid artery (>=70%). Mild stenosis in the right external carotid artery (<50%). Stenosis in the left internal carotid artery (>=70%). The PSV internal/common carotid artery ratio is consistent with a stenosis of >70% bilaterally. Antegrade right vertebral artery flow. Antegrade left vertebral artery flow. Follow up in six months is appropriate if clinically indicated. Compared to the study done on 10/23/2016, no significant change. Mitral regurgitation (I34.0)  Benign essential HTN (I10)  Heart failure, systolic and diastolic, chronic (H41.74)  Echocardiogram 06/02/2017: Left ventricle cavity is normal in size. Mild concentric hypertrophy of the left ventricle. Abnormal septal wall motion due to left bundle branch block. Severe global hypokinesis. Doppler evidence of grade II (pseudonormal) diastolic dysfunction, elevated LAP. Calculated EF 20%. Left atrial cavity is mildly dilated. Aneurysmal interatrial septum without PFO. Inadequate tricuspid regurgitation jet to estimate pulmonary artery pressure. Normal right atrial pressure. Compared to previous study dated 12/18/2014, LVEF is significantly reduced. Laboratory examination (Z01.89)  Labs 11/05/2017: Total cholesterol 137, triglycerides 79, HDL 58, LDL 63. 06/04/2017: Platelets 394, CBC otherwise normal. Glucose 155, creatinine 1.5, EGFR 33/38, potassium 4.7, BMP normal. INR 1.0, prothrombin time 10.5. 05/20/2017: Vitamin B12 normal. Hemoglobin A1c 6.1%. Cholesterol 220, HDL 56, triglycerides 108, LDL 142. CBC normal. Potassium 5.0, EGFR 38/33, creatinine 1.5,  CMP otherwise normal.  Allergies Lynn Carey; 11/23/2017 1:20 PM) Lisinopril *CHEMICALS*  Cough.  Family History Lynn Carey;  2017-12-09 1:20 PM) Mother  Deceased. at age 51, from [place] Father  Deceased. died in his late May 16, 2022, early 54's from a MI. Siblings  1 half brother Deceased from an MI, CABG at age 1. 81 half sister with heart murmur.  Social History Lynn Carey; 12/09/2017 1:20 PM) Current tobacco use  Never smoker. Non Drinker/No Alcohol Use  Marital status  Divorced. Living Situation  lives with daughter Number of Children  5.  Past Surgical History Lynn Carey; 12-09-17 1:20 PM) H/O CABGs in [place] cath 04/21/11: EF 45%/. LM stent May 16, 2002.  LIMA to LAD, SVG to PDA patent.  Circumflex coronary artery 99% on 05/12/11: Unsuccessful attempt at PCI circumflex and Ramus intermediate.  Medical therapy only. Circumflex coronary artery has large OM2,OM3. 80% stenosis at OM2 origin SVG to obtuse marginal was occluded. Left subclavian artery showed a 30% stenosis with mild damping.  Hip Replacement - Left [12/13/2012]: Total Hip Replacement - Right [01/30/2014]: Right. Cataract Extraction-Bilateral [09/2017]:  Medication History Lynn Carey; 2017-12-09 1:26 PM) amLODIPine Besylate (10MG Tablet, 1 (one) Tablet Tablet Oral daily, Taken starting 09/16/2017) Active. BiDil (20-37.5MG Tablet, 2 (two) Tablet Tablet Oral three times daily, Taken starting 09/02/2017) Active. Rosuvastatin Calcium (40MG Tablet, 1 Tablet Tablet Oral daily, Taken starting 09/02/2017) Active. Ezetimibe (10MG Tablet, 1 (one) Tablet Tablet Oral daily, Taken starting 09/02/2017) Active. Bisoprolol Fumarate (5MG Tablet, 1 (one) Tablet Tablet Tablet Oral daily, Taken starting 06/28/2017) Active. Clopidogrel Bisulfate (75MG Tablet, 1 Tablet Tablet Tablet Oral daily with food, Taken starting 06/28/2017) Active. Ranexa (500MG Tablet ER 12HR, 1 Tablet ER 12HR Tablet ER 12H  Oral two times daily, Taken starting 06/02/2017) Active. Nitrostat (0.4MG Tab Sublingual, 1 (one) Tab Sublingual Tab Su Sublingual take as directed for chest pain every 5 minutes, Taken starting 07/18/2014) Active. Tylenol Arthritis Ext Relief (650MG Tablet ER, 1 Oral every four to six hours as needed, Taken starting 08/04/2013) Active. Aspirin (81MG Tablet, 2 Oral one time a day) Active. Januvia (100MG Tablet, 1 Oral daily) Active. Chlortrimeton 4 mg (Free Text) (1 every 4 hrs as needed) Active: for allergies. Vitamin D (2000UNIT Capsule, 2 Oral daily) Active. ProAir HFA (108 (90 Base)MCG/ACT Aerosol Soln, 2 puffs every four hours Inhalation as needed) Active. Montelukast Sodium (10MG Tablet, 1 Oral in the evening) Active. Fluticasone Propionate (50MCG/ACT Suspension, 1 spray each nostril Nasal daily as needed) Active. Telmisartan-HCTZ (80-12.5MG Tablet, 1 Oral daily) Active. Temazepam (15MG Capsule, 1 Oral at bedtime as needed) Active. Pantoprazole Sodium (40MG Tablet DR, 1 Oral daily) Active. Hydromet (5-1.5MG/5ML Syrup, 40m Oral as needed) Active. Medications Reconciled (Verbally "everything the same")  Diagnostic Studies History (Cheri Kearns [id number]:21 PM) Echocardiogram  Echocardiogram 06/02/2017: Left ventricle cavity is normal in size. Mild concentric hypertrophy of the left ventricle. Abnormal septal wall motion due to left bundle branch block. Severe global hypokinesis. Doppler evidence of grade II (pseudonormal) diastolic dysfunction, elevated LAP. Calculated EF 20%. Left atrial cavity is mildly dilated. Aneurysmal interatrial septum without PFO. Inadequate tricuspid regurgitation jet to estimate pulmonary artery pressure. Normal right atrial pressure. Compared to previous study dated 12/18/2014, LVEF is significantly reduced. Carotid Doppler  04/28/2017: Stenosis in the right internal carotid artery (>=70%). Mild stenosis in the right external carotid  artery (<50%). Stenosis in the left internal carotid artery (>=70%). The PSV internal/common carotid artery ratio is consistent with a stenosis of >70% bilaterally. Antegrade right vertebral artery flow. Antegrade left vertebral artery flow. Follow up in six months is appropriate if clinically indicated. Compared to  the study done on 10/23/2016, no significant change. Colonoscopy 2008  Coronary Angiogram  06/18/2017: Occluded LM/Cx. Patent LIMA to LAD. PTCA SVG to RCA prox 80% to 0% with 4.0x15 Xience DES. (CABG 1997) Sleep study 01/09/08 (negative for sleep apnea).     Review of Systems Gwinda Maine FNP-C; 11/24/2017 12:21 PM) General Not Present- Fatigue, Fever, Night Sweats and Weight Gain. Skin Not Present- Itching and Rash. HEENT Not Present- Headache. Respiratory Present- Cough (chronic) and Difficulty Breathing on Exertion (worsening). Not Present- Wakes up from Sleep Wheezing or Short of Breath. Cardiovascular Present- Chest Pain (worsening over the last 10 days). Not Present- Claudications, Edema, Fainting, Orthopnea and Swelling of Extremities. Gastrointestinal Not Present- Abdominal Pain, Constipation, Diarrhea, Nausea and Vomiting. Musculoskeletal Present- Joint Pain (right hip and knee pain especially when she sits and has to get up. Left hip replacement. Lower back pain). Not Present- Joint Swelling. Neurological Not Present- Headaches. Hematology Not Present- Blood Clots, Easy Bruising and Nose Bleed. All other systems negative  Vitals Lynn Carey; 11/23/2017 1:30 PM) 11/23/2017 1:24 PM Weight: 152.06 lb Height: 59.5in Body Surface Area: 1.65 m Body Mass Index: 30.2 kg/m  Pulse: 92 (Regular)  P.OX: 95% (Room air) BP: 139/62 (Sitting, Right Arm, Standard)       Physical Exam Gwinda Maine, FNP-C; 11/24/2017 12:58 PM) General Mental Status-Alert. General Appearance-Cooperative, Appears stated age, Not in acute  distress. Orientation-Oriented X3. Build & Nutrition-Well nourished(overweight) and Short statured.  Head and Neck Thyroid Gland Characteristics - no palpable nodules, no palpable enlargement.  Chest and Lung Exam Chest and lung exam reveals -quiet, even and easy respiratory effort with no use of accessory muscles. Palpation Tender - No chest wall tenderness. Auscultation Adventitious sounds - Expiratory & inspiratory wheeze - Both Lung Fields.  Cardiovascular Cardiovascular examination reveals -normal heart sounds, regular rate and rhythm with no murmurs. Inspection Jugular vein - Bilateral - Inspection Normal.  Abdomen Inspection Contour - Obese. Palpation/Percussion Normal exam - Non Tender and No hepatosplenomegaly. Auscultation Normal exam - Bowel sounds normal.  Peripheral Vascular Lower Extremity Inspection - Left - No Pigmentation, No Varicose veins. Right - No Pigmentation, No Varicose veins. Palpation - Edema - Left - No edema. Right - No edema. Popliteal pulse - Bilateral - 1+(Pulsus difficult to feel due to patient's bodily habitus.). Dorsalis pedis pulse - Bilateral - 1+. Posterior tibial pulse - Bilateral - 1+. Carotid arteries - Bilateral-Harsh Bruit. Abdomen-No prominent abdominal aortic pulsation, No epigastric bruit.  Neurologic Motor-Grossly intact without any focal deficits.  Musculoskeletal Global Assessment Left Lower Extremity - normal range of motion without pain. Right Lower Extremity - normal range of motion without pain.    Assessment & Plan Gwinda Maine FNP-C; 11/24/2017 12:59 PM) Unstable angina (I20.0) Coronary artery disease involving native coronary artery of native heart with angina pectoris (I25.10) Story: Coronary angiogram 06/18/2017: Occluded LM/Cx. Patent LIMA to LAD. PTCA SVG to RCA prox 80% to 0% with 4.0x15 Xience DES. (CABG 1997). Occluded SVG to OM. Impression: Labs 06/28/2017: Sinus bradycardia at the rate  of 59 bpm, borderline criteria for left atrial enlargement, leftward axis. T-wave inversion, cannot exclude anterolateral ischemia. QRS widening, borderline criteria for LVH. No significant change from EKG 05/24/2017.. Current Plans Changed Ranexa 1000MG, 1 (one) Tablet two times daily, #60, 30 days starting 11/23/2017, No Refill. Local Order: increased dose METABOLIC PANEL, BASIC (29798) CBC & PLATELETS (AUTO) (92119) Benign essential HTN (I10) Left bundle branch block (I44.7) Story: EKG 11/23/2017: Normal sinus rhythm at 94  bpm with first degree AV block, left axis deviation, left bundle branch block, no further analysis due to LBBB. Compared to EKG in March 2019, LBBB is new. Current Plans Complete electrocardiogram (93000) Heart failure, systolic and diastolic, chronic (G01.74) Story: Echocardiogram 06/02/2017: Left ventricle cavity is normal in size. Mild concentric hypertrophy of the left ventricle. Abnormal septal wall motion due to left bundle branch block. Severe global hypokinesis. Doppler evidence of grade II (pseudonormal) diastolic dysfunction, elevated LAP. Calculated EF 20%. Left atrial cavity is mildly dilated. Aneurysmal interatrial septum without PFO. Inadequate tricuspid regurgitation jet to estimate pulmonary artery pressure. Normal right atrial pressure. Compared to previous study dated 12/18/2014, LVEF is significantly reduced. Future Plans 05/10/2018: Echocardiography, transthoracic, real-time with image documentation (2D), includes M-mode recording, when performed, complete, with spectral Doppler echocardiography, and with color flow Doppler echocardiography (94496) - one time Asymptomatic bilateral carotid artery stenosis (P59.16) Story: Carotid artery duplex 04/28/2017: Stenosis in the right internal carotid artery (>=70%). Mild stenosis in the right external carotid artery (<50%). Stenosis in the left internal carotid artery (>=70%). The PSV internal/common carotid  artery ratio is consistent with a stenosis of >70% bilaterally. Antegrade right vertebral artery flow. Antegrade left vertebral artery flow. Follow up in six months is appropriate if clinically indicated. Compared to the study done on 10/23/2016, no significant change. Future Plans 12/22/2017: Complete duplex scan of bilateral carotid arteries (38466) - one time Laboratory examination (Z01.89) Story: Labs 11/05/2017: Total cholesterol 137, triglycerides 79, HDL 58, LDL 63.  06/04/2017: Platelets 394, CBC otherwise normal. Glucose 155, creatinine 1.5, EGFR 33/38, potassium 4.7, BMP normal. INR 1.0, prothrombin time 10.5.  05/20/2017: Vitamin B12 normal. Hemoglobin A1c 6.1%. Cholesterol 220, HDL 56, triglycerides 108, LDL 142. CBC normal. Potassium 5.0, EGFR 38/33, creatinine 1.5, CMP otherwise normal.  Note:-  Recommendations:  77 year old African-American female with coronary artery disease status post CABG 1997, PCI to SVG with 4.0 x 15 mm resolute stent in 06/2017, controlled hypertension, hyperlipidemia, ischemic cardio myopathy EF 30%, stable bilateral high-grade carotid stenosis.  Chest pain: Accelerated unstable angina in patient with known CAD on excellent medical therapy. I discussed the options for stress test followed by possible coronary bypass graft angiography. Patient is leaving for [place] next week and says that she would rather burden proceeding with coronary and bypass graft angiography as she is very symptomatic. We'll arrange for coronary and bypass graft angiography with possible intervention this week. In the meantime, I'll increase her Ranexa to 1000 mg twice daily. Continue rest of the medical therapy.  Bialteral >70% carotid stenosis: Asymptomatic. She will need follow up carotid duplex. Continue aspirin, statin.  Ischemic cardiomyopathy: Stable. Euvolumic  LBBB: New onset compared to March 2019.  Anemia: Stable to improved. No onging bleeding  *I have  discussed this case with Dr. Virgina Jock and he personally examined the patient and participated in formulating the plan.*  CC Dr. Glendale Chard.    Signed by Gwinda Maine, FNP-C (11/24/2017 1:00 PM)

## 2017-11-25 ENCOUNTER — Encounter (HOSPITAL_COMMUNITY)
Admission: RE | Disposition: A | Discharge status: Home / Self Care | Payer: Self-pay | Source: Ambulatory Visit | Attending: Cardiology

## 2017-11-25 ENCOUNTER — Ambulatory Visit (HOSPITAL_COMMUNITY)
Admission: RE | Admit: 2017-11-25 | Discharge: 2017-11-25 | Disposition: A | Discharge status: Home / Self Care | Payer: Medicare HMO | Source: Ambulatory Visit | Attending: Cardiology | Admitting: Cardiology

## 2017-11-25 ENCOUNTER — Encounter (HOSPITAL_COMMUNITY): Payer: Self-pay | Admitting: Cardiology

## 2017-11-25 ENCOUNTER — Ambulatory Visit (HOSPITAL_COMMUNITY): Payer: Medicare HMO

## 2017-11-25 DIAGNOSIS — E1151 Type 2 diabetes mellitus with diabetic peripheral angiopathy without gangrene: Secondary | ICD-10-CM | POA: Insufficient documentation

## 2017-11-25 DIAGNOSIS — I5042 Chronic combined systolic (congestive) and diastolic (congestive) heart failure: Secondary | ICD-10-CM | POA: Diagnosis not present

## 2017-11-25 DIAGNOSIS — I70219 Atherosclerosis of native arteries of extremities with intermittent claudication, unspecified extremity: Secondary | ICD-10-CM | POA: Insufficient documentation

## 2017-11-25 DIAGNOSIS — I2582 Chronic total occlusion of coronary artery: Secondary | ICD-10-CM | POA: Insufficient documentation

## 2017-11-25 DIAGNOSIS — I255 Ischemic cardiomyopathy: Secondary | ICD-10-CM | POA: Diagnosis not present

## 2017-11-25 DIAGNOSIS — E782 Mixed hyperlipidemia: Secondary | ICD-10-CM | POA: Insufficient documentation

## 2017-11-25 DIAGNOSIS — I200 Unstable angina: Secondary | ICD-10-CM | POA: Diagnosis present

## 2017-11-25 DIAGNOSIS — I447 Left bundle-branch block, unspecified: Secondary | ICD-10-CM | POA: Insufficient documentation

## 2017-11-25 DIAGNOSIS — I25710 Atherosclerosis of autologous vein coronary artery bypass graft(s) with unstable angina pectoris: Secondary | ICD-10-CM | POA: Diagnosis not present

## 2017-11-25 DIAGNOSIS — I110 Hypertensive heart disease with heart failure: Secondary | ICD-10-CM | POA: Diagnosis not present

## 2017-11-25 DIAGNOSIS — I6523 Occlusion and stenosis of bilateral carotid arteries: Secondary | ICD-10-CM | POA: Diagnosis not present

## 2017-11-25 DIAGNOSIS — I340 Nonrheumatic mitral (valve) insufficiency: Secondary | ICD-10-CM | POA: Insufficient documentation

## 2017-11-25 DIAGNOSIS — I25118 Atherosclerotic heart disease of native coronary artery with other forms of angina pectoris: Secondary | ICD-10-CM | POA: Diagnosis not present

## 2017-11-25 DIAGNOSIS — Z7982 Long term (current) use of aspirin: Secondary | ICD-10-CM | POA: Insufficient documentation

## 2017-11-25 DIAGNOSIS — Z955 Presence of coronary angioplasty implant and graft: Secondary | ICD-10-CM | POA: Diagnosis not present

## 2017-11-25 DIAGNOSIS — I25110 Atherosclerotic heart disease of native coronary artery with unstable angina pectoris: Secondary | ICD-10-CM | POA: Diagnosis present

## 2017-11-25 HISTORY — PX: LEFT HEART CATH AND CORONARY ANGIOGRAPHY: CATH118249

## 2017-11-25 LAB — BASIC METABOLIC PANEL
Anion gap: 9 (ref 5–15)
BUN: 20 mg/dL (ref 8–23)
CO2: 25 mmol/L (ref 22–32)
Calcium: 9 mg/dL (ref 8.9–10.3)
Chloride: 106 mmol/L (ref 98–111)
Creatinine, Ser: 1.2 mg/dL — ABNORMAL HIGH (ref 0.44–1.00)
GFR calc Af Amer: 49 mL/min — ABNORMAL LOW (ref >60)
GFR calc non Af Amer: 42 mL/min — ABNORMAL LOW (ref >60)
Glucose, Bld: 121 mg/dL — ABNORMAL HIGH (ref 70–99)
Potassium: 3.6 mmol/L (ref 3.5–5.1)
Sodium: 140 mmol/L (ref 135–145)

## 2017-11-25 LAB — CBC
HCT: 31.9 % — ABNORMAL LOW (ref 36.0–46.0)
Hemoglobin: 9.8 g/dL — ABNORMAL LOW (ref 12.0–15.0)
MCH: 29.6 pg (ref 26.0–34.0)
MCHC: 30.7 g/dL (ref 30.0–36.0)
MCV: 96.4 fL (ref 78.0–100.0)
Platelets: 317 10*3/uL (ref 150–400)
RBC: 3.31 MIL/uL — ABNORMAL LOW (ref 3.87–5.11)
RDW: 13.8 % (ref 11.5–15.5)
WBC: 5.9 10*3/uL (ref 4.0–10.5)

## 2017-11-25 LAB — ECHOCARDIOGRAM COMPLETE
Height: 59 in
Weight: 2416 oz

## 2017-11-25 LAB — GLUCOSE, CAPILLARY: Glucose-Capillary: 119 mg/dL — ABNORMAL HIGH (ref 70–99)

## 2017-11-25 SURGERY — LEFT HEART CATH AND CORONARY ANGIOGRAPHY
Anesthesia: LOCAL

## 2017-11-25 MED ORDER — CLOPIDOGREL BISULFATE 75 MG PO TABS
75.0000 mg | ORAL_TABLET | Freq: Once | ORAL | Status: AC
  Administered 2017-11-25: 75 mg via ORAL
  Filled 2017-11-25: qty 1

## 2017-11-25 MED ORDER — ACETAMINOPHEN 325 MG PO TABS: 650.0000 mg | ORAL_TABLET | ORAL | Status: DC | PRN

## 2017-11-25 MED ORDER — MIDAZOLAM HCL 2 MG/2ML IJ SOLN
INTRAMUSCULAR | Status: DC | PRN
  Administered 2017-11-25: 1 mg via INTRAVENOUS

## 2017-11-25 MED ORDER — FENTANYL CITRATE (PF) 100 MCG/2ML IJ SOLN
INTRAMUSCULAR | Status: DC | PRN
  Administered 2017-11-25: 50 ug via INTRAVENOUS

## 2017-11-25 MED ORDER — NITROGLYCERIN 0.4 MG SL SUBL: 0.4000 mg | SUBLINGUAL_TABLET | SUBLINGUAL | Status: DC | PRN

## 2017-11-25 MED ORDER — HEPARIN (PORCINE) IN NACL 1000-0.9 UT/500ML-% IV SOLN
INTRAVENOUS | Status: DC | PRN
  Administered 2017-11-25 (×2): 500 mL

## 2017-11-25 MED ORDER — SODIUM CHLORIDE 0.9% FLUSH: 3.0000 mL | Freq: Two times a day (BID) | INTRAVENOUS | Status: DC

## 2017-11-25 MED ORDER — ACETAMINOPHEN 325 MG PO TABS: 650.0000 mg | ORAL_TABLET | Freq: Four times a day (QID) | ORAL | Status: DC | PRN

## 2017-11-25 MED ORDER — SODIUM CHLORIDE 0.9 % IV SOLN: INTRAVENOUS | Status: AC

## 2017-11-25 MED ORDER — FENTANYL CITRATE (PF) 100 MCG/2ML IJ SOLN
INTRAMUSCULAR | Status: AC
  Filled 2017-11-25: qty 2

## 2017-11-25 MED ORDER — LIDOCAINE HCL (PF) 1 % IJ SOLN
INTRAMUSCULAR | Status: DC | PRN
  Administered 2017-11-25: 20 mL

## 2017-11-25 MED ORDER — MIDAZOLAM HCL 2 MG/2ML IJ SOLN
INTRAMUSCULAR | Status: AC
  Filled 2017-11-25: qty 2

## 2017-11-25 MED ORDER — IOPAMIDOL (ISOVUE-370) INJECTION 76%
INTRAVENOUS | Status: DC | PRN
  Administered 2017-11-25: 65 mL via INTRA_ARTERIAL

## 2017-11-25 MED ORDER — SODIUM CHLORIDE 0.9 % IV SOLN
INTRAVENOUS | Status: AC
  Administered 2017-11-25: 06:00:00 via INTRAVENOUS

## 2017-11-25 MED ORDER — ONDANSETRON HCL 4 MG/2ML IJ SOLN: 4.0000 mg | Freq: Four times a day (QID) | INTRAMUSCULAR | Status: DC | PRN

## 2017-11-25 MED ORDER — IOPAMIDOL (ISOVUE-370) INJECTION 76%
INTRAVENOUS | Status: AC
  Filled 2017-11-25: qty 125

## 2017-11-25 MED ORDER — SODIUM CHLORIDE 0.9% FLUSH: 3.0000 mL | INTRAVENOUS | Status: DC | PRN

## 2017-11-25 MED ORDER — ISOSORB DINITRATE-HYDRALAZINE 20-37.5 MG PO TABS: 2.0000 | ORAL_TABLET | Freq: Three times a day (TID) | ORAL | Status: DC

## 2017-11-25 MED ORDER — ASPIRIN 81 MG PO CHEW: 81.0000 mg | CHEWABLE_TABLET | ORAL | Status: DC

## 2017-11-25 MED ORDER — RANOLAZINE ER 500 MG PO TB12: 1000.0000 mg | ORAL_TABLET | Freq: Two times a day (BID) | ORAL | Status: DC

## 2017-11-25 MED ORDER — ALBUTEROL SULFATE HFA 108 (90 BASE) MCG/ACT IN AERS: 2.0000 | INHALATION_SPRAY | RESPIRATORY_TRACT | Status: DC | PRN

## 2017-11-25 MED ORDER — HEPARIN (PORCINE) IN NACL 1000-0.9 UT/500ML-% IV SOLN
INTRAVENOUS | Status: AC
  Filled 2017-11-25: qty 1000

## 2017-11-25 MED ORDER — LIDOCAINE HCL (PF) 1 % IJ SOLN
INTRAMUSCULAR | Status: AC
  Filled 2017-11-25: qty 30

## 2017-11-25 MED ORDER — SODIUM CHLORIDE 0.9 % IV SOLN: 250.0000 mL | INTRAVENOUS | Status: DC | PRN

## 2017-11-25 SURGICAL SUPPLY — 16 items
CATH INFINITI 5 FR AR1 MOD (CATHETERS) ×1 IMPLANT
CATH INFINITI 5 FR IM (CATHETERS) ×1 IMPLANT
CATH INFINITI 5 FR LCB (CATHETERS) ×1 IMPLANT
CATH INFINITI 5 FR RCB (CATHETERS) ×1 IMPLANT
CATH INFINITI 5FR AL1 (CATHETERS) ×1 IMPLANT
CATH INFINITI 5FR MULTPACK ANG (CATHETERS) ×1 IMPLANT
DEVICE CLOSURE MYNXGRIP 5F (Vascular Products) ×1 IMPLANT
KIT HEART LEFT (KITS) ×2 IMPLANT
KIT MICROPUNCTURE NIT STIFF (SHEATH) ×1 IMPLANT
PACK CARDIAC CATHETERIZATION (CUSTOM PROCEDURE TRAY) ×2 IMPLANT
SHEATH PINNACLE 5F 10CM (SHEATH) ×1 IMPLANT
SHEATH PROBE COVER 6X72 (BAG) ×1 IMPLANT
TRANSDUCER W/STOPCOCK (MISCELLANEOUS) ×2 IMPLANT
TUBING CIL FLEX 10 FLL-RA (TUBING) ×2 IMPLANT
WIRE EMERALD 3MM-J .035X150CM (WIRE) ×1 IMPLANT
WIRE EMERALD 3MM-J .035X260CM (WIRE) ×1 IMPLANT

## 2017-11-25 NOTE — Progress Notes (Signed)
  Echocardiogram 2D Echocardiogram has been performed.  Merrie Roof F 11/25/2017, 12:36 PM

## 2017-11-25 NOTE — Interval H&P Note (Signed)
History and Physical Interval Note:  11/25/2017 7:29 AM  Lynn Carey  has presented today for surgery, with the diagnosis of angina  The various methods of treatment have been discussed with the patient and family. After consideration of risks, benefits and other options for treatment, the patient has consented to  Procedure(s): LEFT HEART CATH AND CORONARY ANGIOGRAPHY (N/A) as a surgical intervention .  The patient's history has been reviewed, patient examined, no change in status, stable for surgery.  I have reviewed the patient's chart and labs.  Questions were answered to the patient's satisfaction.    2016 Appropriate Use Criteria for Coronary Revascularization in Patients With Acute Coronary Syndrome NSTEMI/UA High Risk (TIMI Score 5-7) NSTEMI/Unstable angina, stabilized patient at high risk Link Here: sistemancia.com Indication:  Revascularization by PCI or CABG of 1 or more arteries in a patient with NSTEMI or unstable angina with Stabilization after presentation High risk for clinical events  A (7) Indication: 16; Score 7     [place]

## 2017-11-25 NOTE — Discharge Instructions (Signed)

## 2017-11-25 NOTE — Progress Notes (Signed)
Pt has an extended stay due to Dr Celso Sickle wanting an echo prior to leaving.

## 2017-12-13 DIAGNOSIS — Z23 Encounter for immunization: Secondary | ICD-10-CM | POA: Diagnosis not present

## 2017-12-13 DIAGNOSIS — I130 Hypertensive heart and chronic kidney disease with heart failure and stage 1 through stage 4 chronic kidney disease, or unspecified chronic kidney disease: Secondary | ICD-10-CM | POA: Diagnosis not present

## 2017-12-13 DIAGNOSIS — N183 Chronic kidney disease, stage 3 (moderate): Secondary | ICD-10-CM | POA: Diagnosis not present

## 2017-12-13 DIAGNOSIS — E1122 Type 2 diabetes mellitus with diabetic chronic kidney disease: Secondary | ICD-10-CM | POA: Diagnosis not present

## 2017-12-13 DIAGNOSIS — Z79899 Other long term (current) drug therapy: Secondary | ICD-10-CM

## 2017-12-13 DIAGNOSIS — I5042 Chronic combined systolic (congestive) and diastolic (congestive) heart failure: Secondary | ICD-10-CM

## 2017-12-13 DIAGNOSIS — N08 Glomerular disorders in diseases classified elsewhere: Secondary | ICD-10-CM | POA: Diagnosis not present

## 2017-12-15 DIAGNOSIS — Z01818 Encounter for other preprocedural examination: Secondary | ICD-10-CM | POA: Diagnosis not present

## 2017-12-15 DIAGNOSIS — H35371 Puckering of macula, right eye: Secondary | ICD-10-CM | POA: Diagnosis not present

## 2017-12-22 DIAGNOSIS — I6523 Occlusion and stenosis of bilateral carotid arteries: Secondary | ICD-10-CM | POA: Diagnosis not present

## 2017-12-23 DIAGNOSIS — I5020 Unspecified systolic (congestive) heart failure: Secondary | ICD-10-CM | POA: Diagnosis not present

## 2017-12-23 DIAGNOSIS — I6523 Occlusion and stenosis of bilateral carotid arteries: Secondary | ICD-10-CM | POA: Diagnosis not present

## 2017-12-23 DIAGNOSIS — I2510 Atherosclerotic heart disease of native coronary artery without angina pectoris: Secondary | ICD-10-CM | POA: Diagnosis not present

## 2017-12-23 DIAGNOSIS — E782 Mixed hyperlipidemia: Secondary | ICD-10-CM | POA: Diagnosis not present

## 2017-12-23 DIAGNOSIS — I10 Essential (primary) hypertension: Secondary | ICD-10-CM | POA: Diagnosis not present

## 2017-12-28 DIAGNOSIS — H5789 Other specified disorders of eye and adnexa: Secondary | ICD-10-CM | POA: Diagnosis not present

## 2017-12-28 DIAGNOSIS — H35371 Puckering of macula, right eye: Secondary | ICD-10-CM | POA: Diagnosis not present

## 2017-12-28 DIAGNOSIS — H43821 Vitreomacular adhesion, right eye: Secondary | ICD-10-CM | POA: Diagnosis not present

## 2017-12-29 DIAGNOSIS — H5789 Other specified disorders of eye and adnexa: Secondary | ICD-10-CM | POA: Diagnosis not present

## 2017-12-29 DIAGNOSIS — H43821 Vitreomacular adhesion, right eye: Secondary | ICD-10-CM | POA: Diagnosis not present

## 2017-12-29 DIAGNOSIS — H33011 Retinal detachment with single break, right eye: Secondary | ICD-10-CM | POA: Diagnosis not present

## 2018-01-05 DIAGNOSIS — H3321 Serous retinal detachment, right eye: Secondary | ICD-10-CM | POA: Diagnosis not present

## 2018-01-06 DIAGNOSIS — I2510 Atherosclerotic heart disease of native coronary artery without angina pectoris: Secondary | ICD-10-CM | POA: Diagnosis not present

## 2018-01-10 DIAGNOSIS — I5020 Unspecified systolic (congestive) heart failure: Secondary | ICD-10-CM | POA: Diagnosis not present

## 2018-01-10 DIAGNOSIS — I2510 Atherosclerotic heart disease of native coronary artery without angina pectoris: Secondary | ICD-10-CM | POA: Diagnosis not present

## 2018-01-10 DIAGNOSIS — I6523 Occlusion and stenosis of bilateral carotid arteries: Secondary | ICD-10-CM | POA: Diagnosis not present

## 2018-01-10 DIAGNOSIS — E782 Mixed hyperlipidemia: Secondary | ICD-10-CM | POA: Diagnosis not present

## 2018-01-20 DIAGNOSIS — I10 Essential (primary) hypertension: Secondary | ICD-10-CM | POA: Diagnosis not present

## 2018-01-24 DIAGNOSIS — I6523 Occlusion and stenosis of bilateral carotid arteries: Secondary | ICD-10-CM | POA: Diagnosis not present

## 2018-01-24 DIAGNOSIS — I5020 Unspecified systolic (congestive) heart failure: Secondary | ICD-10-CM | POA: Diagnosis not present

## 2018-01-24 DIAGNOSIS — E782 Mixed hyperlipidemia: Secondary | ICD-10-CM | POA: Diagnosis not present

## 2018-01-24 DIAGNOSIS — I2510 Atherosclerotic heart disease of native coronary artery without angina pectoris: Secondary | ICD-10-CM | POA: Diagnosis not present

## 2018-01-25 ENCOUNTER — Other Ambulatory Visit: Payer: Self-pay | Admitting: Cardiology

## 2018-01-25 ENCOUNTER — Other Ambulatory Visit (HOSPITAL_COMMUNITY): Payer: Self-pay | Admitting: Cardiology

## 2018-01-25 DIAGNOSIS — I6523 Occlusion and stenosis of bilateral carotid arteries: Secondary | ICD-10-CM

## 2018-01-26 DIAGNOSIS — H3321 Serous retinal detachment, right eye: Secondary | ICD-10-CM | POA: Diagnosis not present

## 2018-01-26 DIAGNOSIS — Z09 Encounter for follow-up examination after completed treatment for conditions other than malignant neoplasm: Secondary | ICD-10-CM | POA: Diagnosis not present

## 2018-01-28 ENCOUNTER — Other Ambulatory Visit: Payer: Medicare HMO

## 2018-01-31 ENCOUNTER — Other Ambulatory Visit: Payer: Medicare HMO

## 2018-02-01 ENCOUNTER — Ambulatory Visit
Admission: RE | Admit: 2018-02-01 | Discharge: 2018-02-01 | Disposition: A | Discharge status: Home / Self Care | Payer: Medicare HMO | Source: Ambulatory Visit | Attending: Cardiology | Admitting: Cardiology

## 2018-02-01 DIAGNOSIS — I6523 Occlusion and stenosis of bilateral carotid arteries: Secondary | ICD-10-CM

## 2018-02-01 DIAGNOSIS — I10 Essential (primary) hypertension: Secondary | ICD-10-CM | POA: Diagnosis not present

## 2018-02-01 MED ORDER — IOPAMIDOL (ISOVUE-370) INJECTION 76%
75.0000 mL | Freq: Once | INTRAVENOUS | Status: AC | PRN
  Administered 2018-02-01: 75 mL via INTRAVENOUS

## 2018-02-08 DIAGNOSIS — H3321 Serous retinal detachment, right eye: Secondary | ICD-10-CM | POA: Diagnosis not present

## 2018-02-08 DIAGNOSIS — H33051 Total retinal detachment, right eye: Secondary | ICD-10-CM | POA: Diagnosis not present

## 2018-02-09 DIAGNOSIS — H3321 Serous retinal detachment, right eye: Secondary | ICD-10-CM | POA: Diagnosis not present

## 2018-02-15 DIAGNOSIS — H43821 Vitreomacular adhesion, right eye: Secondary | ICD-10-CM | POA: Diagnosis not present

## 2018-02-15 DIAGNOSIS — Z09 Encounter for follow-up examination after completed treatment for conditions other than malignant neoplasm: Secondary | ICD-10-CM | POA: Diagnosis not present

## 2018-02-18 DIAGNOSIS — I10 Essential (primary) hypertension: Secondary | ICD-10-CM | POA: Diagnosis not present

## 2018-02-18 DIAGNOSIS — I5020 Unspecified systolic (congestive) heart failure: Secondary | ICD-10-CM | POA: Diagnosis not present

## 2018-02-18 DIAGNOSIS — I2510 Atherosclerotic heart disease of native coronary artery without angina pectoris: Secondary | ICD-10-CM | POA: Diagnosis not present

## 2018-02-18 DIAGNOSIS — I6523 Occlusion and stenosis of bilateral carotid arteries: Secondary | ICD-10-CM | POA: Diagnosis not present

## 2018-02-18 DIAGNOSIS — E782 Mixed hyperlipidemia: Secondary | ICD-10-CM | POA: Diagnosis not present

## 2018-02-22 DIAGNOSIS — H3321 Serous retinal detachment, right eye: Secondary | ICD-10-CM | POA: Diagnosis not present

## 2018-02-22 DIAGNOSIS — H33051 Total retinal detachment, right eye: Secondary | ICD-10-CM | POA: Diagnosis not present

## 2018-02-22 DIAGNOSIS — H33011 Retinal detachment with single break, right eye: Secondary | ICD-10-CM | POA: Diagnosis not present

## 2018-02-28 DIAGNOSIS — I5020 Unspecified systolic (congestive) heart failure: Secondary | ICD-10-CM | POA: Diagnosis not present

## 2018-03-02 ENCOUNTER — Other Ambulatory Visit: Payer: Self-pay

## 2018-03-02 DIAGNOSIS — I6523 Occlusion and stenosis of bilateral carotid arteries: Secondary | ICD-10-CM

## 2018-03-05 ENCOUNTER — Other Ambulatory Visit: Payer: Self-pay | Admitting: Internal Medicine

## 2018-03-05 ENCOUNTER — Other Ambulatory Visit: Payer: Self-pay | Admitting: Nurse Practitioner

## 2018-03-11 DIAGNOSIS — H35351 Cystoid macular degeneration, right eye: Secondary | ICD-10-CM | POA: Diagnosis not present

## 2018-03-21 ENCOUNTER — Encounter: Payer: Self-pay | Admitting: Internal Medicine

## 2018-03-21 ENCOUNTER — Ambulatory Visit (INDEPENDENT_AMBULATORY_CARE_PROVIDER_SITE_OTHER): Payer: Medicare HMO | Admitting: Internal Medicine

## 2018-03-21 VITALS — BP 116/78 | HR 74 | Temp 97.7°F | Ht 59.0 in | Wt 142.8 lb

## 2018-03-21 DIAGNOSIS — N182 Chronic kidney disease, stage 2 (mild): Secondary | ICD-10-CM | POA: Diagnosis not present

## 2018-03-21 DIAGNOSIS — I1310 Hypertensive heart and chronic kidney disease without heart failure, with stage 1 through stage 4 chronic kidney disease, or unspecified chronic kidney disease: Secondary | ICD-10-CM | POA: Diagnosis not present

## 2018-03-21 DIAGNOSIS — I25119 Atherosclerotic heart disease of native coronary artery with unspecified angina pectoris: Secondary | ICD-10-CM

## 2018-03-21 DIAGNOSIS — R05 Cough: Secondary | ICD-10-CM

## 2018-03-21 DIAGNOSIS — Z79899 Other long term (current) drug therapy: Secondary | ICD-10-CM

## 2018-03-21 DIAGNOSIS — I10 Essential (primary) hypertension: Secondary | ICD-10-CM | POA: Diagnosis not present

## 2018-03-21 DIAGNOSIS — E1122 Type 2 diabetes mellitus with diabetic chronic kidney disease: Secondary | ICD-10-CM | POA: Diagnosis not present

## 2018-03-21 DIAGNOSIS — R053 Chronic cough: Secondary | ICD-10-CM

## 2018-03-21 MED ORDER — HYDROCODONE-HOMATROPINE 5-1.5 MG/5ML PO SYRP: 5.0000 mL | ORAL_SOLUTION | Freq: Four times a day (QID) | ORAL | 0 refills | Status: DC | PRN

## 2018-03-21 NOTE — Progress Notes (Signed)
Subjective:     Patient ID: Lynn Carey , female    DOB: [birth year] , 77 y.o.   MRN: [id number]   Chief Complaint  Patient presents with  . Diabetes  . Hypertension    HPI  Diabetes  She presents for her follow-up diabetic visit. She has type 2 diabetes mellitus. Her disease course has been stable. There are no hypoglycemic associated symptoms. Pertinent negatives for diabetes include no blurred vision and no chest pain. There are no diabetic complications. Risk factors for coronary artery disease include diabetes mellitus, dyslipidemia, hypertension, sedentary lifestyle, post-menopausal and stress. She is compliant with treatment all of the time. She is following a diabetic diet.  Hypertension  This is a chronic problem. The current episode started more than 1 year ago. The problem has been gradually improving since onset. The problem is controlled. Pertinent negatives include no blurred vision, chest pain or shortness of breath.   Reports compliance with meds.   Past Medical History:  Diagnosis Date  . Anginal pain ([place])    h/o, denies today   . Arthritis    hip - both, shot in L shoulder- 2 weeks ago  . Asthma   . CHF (congestive heart failure) ([place])   . Complication of anesthesia   . Coronary artery disease   . Diabetes mellitus without complication ([place])   . GERD (gastroesophageal reflux disease)   . Hypertension    followed by Dr. Einar Gip  . MYOCARDIAL INFARCTION 05/25/2007   Qualifier: History of  By: Milana Obey, Doroteo Bradford    . Myocardial infarction ([place]) 1997  . PONV (postoperative nausea and vomiting)   . Shortness of breath      Family History  Problem Relation Age of Onset  . Allergies Mother   . Asthma Mother   . Heart disease Mother   . Allergies Child   . Asthma Child   . Heart disease Father   . Breast cancer Daughter      Current Outpatient Medications:  .  ACETAMINOPHEN PO, Take 650 mg by mouth every 6 (six) hours as needed for moderate pain or  headache. , Disp: , Rfl:  .  albuterol (PROVENTIL HFA;VENTOLIN HFA) 108 (90 BASE) MCG/ACT inhaler, Inhale 2 puffs into the lungs every 4 (four) hours as needed for wheezing or shortness of breath., Disp: , Rfl:  .  aspirin EC 81 MG tablet, Take 162 mg by mouth daily., Disp: , Rfl:  .  bisoprolol (ZEBETA) 5 MG tablet, Take 5 mg by mouth daily., Disp: , Rfl:  .  budesonide-formoterol (SYMBICORT) 160-4.5 MCG/ACT inhaler, Inhale 2 puffs into the lungs every 4 (four) hours as needed (wheezing)., Disp: , Rfl:  .  cetirizine (ZYRTEC) 10 MG tablet, Take 10 mg by mouth at bedtime., Disp: , Rfl:  .  Cholecalciferol (VITAMIN D) 2000 UNITS CAPS, Take 2,000 Units by mouth daily. , Disp: , Rfl:  .  clopidogrel (PLAVIX) 75 MG tablet, Take 1 tablet (75 mg total) by mouth daily., Disp: 30 tablet, Rfl: 11 .  isosorbide-hydrALAZINE (BIDIL) 20-37.5 MG per tablet, Take 2 tablets by mouth 3 (three) times daily. , Disp: , Rfl:  .  JANUVIA 100 MG tablet, TAKE 1 TABLET EVERY DAY, Disp: 90 tablet, Rfl: 0 .  loratadine (CLARITIN) 10 MG tablet, Take 10 mg by mouth daily., Disp: , Rfl:  .  montelukast (SINGULAIR) 10 MG tablet, Take 10 mg by mouth at bedtime., Disp: , Rfl:  .  nitroGLYCERIN (NITROSTAT) 0.4 MG SL  tablet, Place 0.4 mg under the tongue every 5 (five) minutes as needed for chest pain., Disp: , Rfl:  .  pantoprazole (PROTONIX) 40 MG tablet, Take 1 tablet (40 mg total) by mouth daily., Disp: 90 tablet, Rfl: 1 .  ranolazine (RANEXA) 500 MG 12 hr tablet, Take 1,000 mg by mouth 2 (two) times daily. , Disp: , Rfl:  .  rosuvastatin (CRESTOR) 20 MG tablet, TAKE 1 TABLET EVERY DAY, Disp: 90 tablet, Rfl: 0 .  spironolactone (ALDACTONE) 50 MG tablet, Take 50 mg by mouth daily., Disp: , Rfl:  .  ezetimibe (ZETIA) 10 MG tablet, Take 10 mg by mouth daily., Disp: , Rfl:  .  HYDROcodone-homatropine (HYDROMET) 5-1.5 MG/5ML syrup, Take 5 mLs by mouth every 6 (six) hours as needed for cough., Disp: 120 mL, Rfl: 0 .  temazepam  (RESTORIL) 15 MG capsule, Take 15 mg by mouth at bedtime as needed for sleep., Disp: , Rfl: 1   Allergies  Allergen Reactions  . Lisinopril Cough     Review of Systems  Constitutional: Negative.   Eyes: Negative for blurred vision.  Respiratory: Positive for cough (she has chronic cough. worsens if she gets hot. delsym no longer effective). Negative for shortness of breath.   Cardiovascular: Negative.  Negative for chest pain.  Gastrointestinal: Negative.   Neurological: Negative.   Psychiatric/Behavioral: Negative.      Today's Vitals   03/21/18 0846  BP: 116/78  Pulse: 74  Temp: 97.7 F (36.5 C)  TempSrc: Oral  Weight: 142 lb 12.8 oz (64.8 kg)  Height: 4\' 11"  (1.499 m)  PainSc: 0-No pain   Body mass index is 28.84 kg/m.   Objective:  Physical Exam Vitals signs and nursing note reviewed.  Constitutional:      Appearance: Normal appearance.  HENT:     Head: Normocephalic and atraumatic.  Neck:     Musculoskeletal: Normal range of motion.  Cardiovascular:     Rate and Rhythm: Normal rate and regular rhythm.     Heart sounds: Normal heart sounds.  Pulmonary:     Effort: Pulmonary effort is normal.     Breath sounds: Normal breath sounds.  Skin:    General: Skin is warm and dry.  Neurological:     General: No focal deficit present.     Mental Status: She is alert.  Psychiatric:        Mood and Affect: Mood normal.         Assessment And Plan:     1. Type 2 diabetes mellitus with stage 2 chronic kidney disease, without long-term current use of insulin (HCC)  I will check CMET, hba1c and lipid panel today. I will be sure to forward a copy to her cardiologist, Dr. Einar Gip.   2. Chronic renal disease, stage II  Chronic, she is encouraged to stay well hydrated.   3. Benign hypertensive heart and renal disease  Well controlled. She will continue with current meds. She is encouraged to avoid adding salt to her foods.   4. Atherosclerosis of native coronary  artery of native heart with angina pectoris (HCC)  Chronic, s/p stent placement March 2019.   5. Chronic cough  She was given rx Hydromet syrup to use prn. She will use Delsym during the day as needed and hydromet at night prn.   6. Drug therapy   Maximino Greenland, MD

## 2018-03-21 NOTE — Patient Instructions (Signed)

## 2018-03-22 LAB — CMP14+EGFR
ALT: 11 IU/L (ref 0–32)
AST: 15 IU/L (ref 0–40)
Albumin/Globulin Ratio: 1.6 (ref 1.2–2.2)
Albumin: 4.2 g/dL (ref 3.5–4.8)
Alkaline Phosphatase: 91 IU/L (ref 39–117)
BUN/Creatinine Ratio: 13 (ref 12–28)
BUN: 17 mg/dL (ref 8–27)
Bilirubin Total: 0.6 mg/dL (ref 0.0–1.2)
CO2: 21 mmol/L (ref 20–29)
Calcium: 9.4 mg/dL (ref 8.7–10.3)
Chloride: 104 mmol/L (ref 96–106)
Creatinine, Ser: 1.31 mg/dL — ABNORMAL HIGH (ref 0.57–1.00)
GFR calc Af Amer: 45 mL/min/{1.73_m2} — ABNORMAL LOW (ref >59)
GFR calc non Af Amer: 39 mL/min/{1.73_m2} — ABNORMAL LOW (ref >59)
Globulin, Total: 2.7 g/dL (ref 1.5–4.5)
Glucose: 97 mg/dL (ref 65–99)
Potassium: 4.6 mmol/L (ref 3.5–5.2)
Sodium: 140 mmol/L (ref 134–144)
Total Protein: 6.9 g/dL (ref 6.0–8.5)

## 2018-03-22 LAB — LIPID PANEL
Chol/HDL Ratio: 3 ratio (ref 0.0–4.4)
Cholesterol, Total: 179 mg/dL (ref 100–199)
HDL: 60 mg/dL (ref >39)
LDL Calculated: 101 mg/dL — ABNORMAL HIGH (ref 0–99)
Triglycerides: 89 mg/dL (ref 0–149)
VLDL Cholesterol Cal: 18 mg/dL (ref 5–40)

## 2018-03-22 LAB — HEMOGLOBIN A1C
Est. average glucose Bld gHb Est-mCnc: 108 mg/dL
Hgb A1c MFr Bld: 5.4 % (ref 4.8–5.6)

## 2018-03-22 NOTE — Progress Notes (Signed)
Your LDL is 101, should be 70 or less. Are you taking the chol medication? Your hba1c is 5.4, this is perfect! I think you can skip Januvia on the weekends. Your sugars will be fine. You are doing great! I suspect that you are not eating enough

## 2018-03-28 ENCOUNTER — Other Ambulatory Visit: Payer: Self-pay | Admitting: Internal Medicine

## 2018-04-12 DIAGNOSIS — Z01818 Encounter for other preprocedural examination: Secondary | ICD-10-CM | POA: Diagnosis not present

## 2018-04-12 DIAGNOSIS — H3321 Serous retinal detachment, right eye: Secondary | ICD-10-CM | POA: Diagnosis not present

## 2018-04-26 ENCOUNTER — Other Ambulatory Visit: Payer: Self-pay | Admitting: Cardiology

## 2018-04-26 DIAGNOSIS — I509 Heart failure, unspecified: Secondary | ICD-10-CM

## 2018-04-29 ENCOUNTER — Ambulatory Visit: Payer: Medicare HMO | Admitting: Vascular Surgery

## 2018-04-29 ENCOUNTER — Encounter: Payer: Self-pay | Admitting: Vascular Surgery

## 2018-04-29 ENCOUNTER — Other Ambulatory Visit: Payer: Self-pay

## 2018-04-29 ENCOUNTER — Ambulatory Visit (HOSPITAL_COMMUNITY)
Admission: RE | Admit: 2018-04-29 | Discharge: 2018-04-29 | Disposition: A | Discharge status: Home / Self Care | Payer: Medicare HMO | Source: Ambulatory Visit | Attending: Vascular Surgery | Admitting: Vascular Surgery

## 2018-04-29 VITALS — BP 144/75 | HR 60 | Resp 18 | Ht 59.0 in | Wt 144.8 lb

## 2018-04-29 DIAGNOSIS — I6523 Occlusion and stenosis of bilateral carotid arteries: Secondary | ICD-10-CM

## 2018-04-29 NOTE — Progress Notes (Signed)
Patient ID: Lynn Carey, female   DOB: 07/06/1940, 78 y.o.   MRN: [id number]  Reason for Consult: New Patient (Initial Visit) (eval carotid stenosis )   Referred by Glendale Chard, MD  Subjective:     HPI:  Lynn Carey is a 78 y.o. female has been followed for carotid stenosis with Dr. Irven Shelling office with Dr. Virgina Jock for some time.  She does not have any history of stroke TIA or amaurosis.  She does have a previous history of a CABG as well as PCI to her saphenous vein graft in March of last year.  She has ischemic cardiomyopathy, hypertension, hyperlipidemia.  She had CT scan prior to today's visit and also presents with carotid duplex in our office today.  She remains at her level of activity without any complaints about that.  She is present today with her daughter there is also a daughter on the telephone in [place].  Past Medical History:  Diagnosis Date  . Anginal pain ([place])    h/o, denies today   . Arthritis    hip - both, shot in L shoulder- 2 weeks ago  . Asthma   . Carotid artery occlusion   . CHF (congestive heart failure) ([place])   . Complication of anesthesia   . Coronary artery disease   . Diabetes mellitus without complication ([place])   . GERD (gastroesophageal reflux disease)   . Hypertension    followed by Dr. Einar Gip  . MYOCARDIAL INFARCTION 05/25/2007   Qualifier: History of  By: Milana Obey, Doroteo [place]    . Myocardial infarction ([place]) 1997  . PONV (postoperative nausea and vomiting)   . Shortness of breath    Family History  Problem Relation Age of Onset  . Allergies Mother   . Asthma Mother   . Heart disease Mother   . Heart attack Mother   . Allergies Child   . Asthma Child   . Heart disease Father   . Heart attack Father   . Breast cancer Daughter    Past Surgical History:  Procedure Laterality Date  . CARDIAC CATHETERIZATION  05/2012   see note  . CORONARY ARTERY BYPASS GRAFT  [address]  . CORONARY STENT INTERVENTION Right 06/18/2017     Procedure: CORONARY STENT INTERVENTION;  Surgeon: Adrian Prows, MD;  Location: [place] [facility];  Service: Cardiovascular;  Laterality: Right;  . EYE SURGERY     both eyes, laser procedure  . JOINT REPLACEMENT Left 12/13/12   hip  . LEFT HEART CATH AND CORONARY ANGIOGRAPHY N/A 11/25/2017   Procedure: LEFT HEART CATH AND CORONARY ANGIOGRAPHY;  Surgeon: [name], MD;  Location: [place] [facility];  Service: Cardiovascular;  Laterality: N/A;  . LEFT HEART CATH AND CORS/GRAFTS ANGIOGRAPHY N/A 06/18/2017   Procedure: LEFT HEART CATH AND CORS/GRAFTS ANGIOGRAPHY;  Surgeon: Adrian Prows, MD;  Location: [place] [facility];  Service: Cardiovascular;  Laterality: N/A;  . LEFT HEART CATHETERIZATION WITH CORONARY/GRAFT ANGIOGRAM N/A 04/21/2011   Procedure: LEFT HEART CATHETERIZATION WITH Beatrix Fetters;  Surgeon: Laverda Page, MD;  Location: [facility] CATH [facility];  Service: Cardiovascular;  Laterality: N/A;  . PERCUTANEOUS CORONARY INTERVENTION-BALLOON ONLY Right 05/12/2011   Procedure: PERCUTANEOUS CORONARY INTERVENTION-BALLOON ONLY;  Surgeon: Laverda Page, MD;  Location: [facility] CATH LAB;  Service: Cardiovascular;  Laterality: Right;  . TOTAL HIP ARTHROPLASTY Left 12/13/2012   Procedure: TOTAL HIP ARTHROPLASTY ANTERIOR APPROACH;  Surgeon: Hessie Dibble, MD;  Location: [place];  Service:  Orthopedics;  Laterality: Left;  . TOTAL HIP ARTHROPLASTY Right 01/30/2014   Procedure: TOTAL HIP ARTHROPLASTY ANTERIOR APPROACH;  Surgeon: Hessie Dibble, MD;  Location: [place];  Service: Orthopedics;  Laterality: Right;  . TUBAL LIGATION      Short Social History:  Social History   Tobacco Use  . Smoking status: Former Smoker    Packs/day: 0.50    Years: 2.00    Pack years: 1.00    Types: Cigarettes    Last attempt to quit: 12/08/1983    Years since quitting: 34.4  . Smokeless tobacco: Never Used  Substance Use Topics  . Alcohol use: No    Allergies  Allergen Reactions  . Lisinopril Cough     Current Outpatient Medications  Medication Sig Dispense Refill  . ACETAMINOPHEN PO Take 650 mg by mouth every 6 (six) hours as needed for moderate pain or headache.     . albuterol (PROVENTIL HFA;VENTOLIN HFA) 108 (90 BASE) MCG/ACT inhaler Inhale 2 puffs into the lungs every 4 (four) hours as needed for wheezing or shortness of breath.    Marland Kitchen aspirin EC 81 MG tablet Take 162 mg by mouth daily.    . bisoprolol (ZEBETA) 5 MG tablet Take 5 mg by mouth daily.    . budesonide-formoterol (SYMBICORT) 160-4.5 MCG/ACT inhaler Inhale 2 puffs into the lungs every 4 (four) hours as needed (wheezing).    . cetirizine (ZYRTEC) 10 MG tablet Take 10 mg by mouth at bedtime.    . Cholecalciferol (VITAMIN D) 2000 UNITS CAPS Take 2,000 Units by mouth daily.     . clopidogrel (PLAVIX) 75 MG tablet Take 1 tablet (75 mg total) by mouth daily. 30 tablet 11  . ezetimibe (ZETIA) 10 MG tablet Take 10 mg by mouth daily.    Marland Kitchen HYDROcodone-homatropine (HYDROMET) 5-1.5 MG/5ML syrup Take 5 mLs by mouth every 6 (six) hours as needed for cough. 120 mL 0  . isosorbide-hydrALAZINE (BIDIL) 20-37.5 MG per tablet Take 2 tablets by mouth 3 (three) times daily.     Marland Kitchen JANUVIA 100 MG tablet TAKE 1 TABLET EVERY DAY 90 tablet 0  . loratadine (CLARITIN) 10 MG tablet Take 10 mg by mouth daily.    . montelukast (SINGULAIR) 10 MG tablet Take 10 mg by mouth at bedtime.    . nitroGLYCERIN (NITROSTAT) 0.4 MG SL tablet Place 0.4 mg under the tongue every 5 (five) minutes as needed for chest pain.    . pantoprazole (PROTONIX) 40 MG tablet Take 1 tablet (40 mg total) by mouth daily. 90 tablet 1  . ranolazine (RANEXA) 500 MG 12 hr tablet Take 1,000 mg by mouth 2 (two) times daily.     . rosuvastatin (CRESTOR) 20 MG tablet TAKE 1 TABLET EVERY DAY 90 tablet 0  . spironolactone (ALDACTONE) 50 MG tablet Take 50 mg by mouth daily.    . temazepam (RESTORIL) 15 MG capsule Take 15 mg by mouth at bedtime as needed for sleep.  1  .  telmisartan-hydrochlorothiazide (MICARDIS HCT) 80-12.5 MG tablet TAKE 1 TABLET EVERY DAY (Patient not taking: Reported on 04/29/2018) 90 tablet 1   No current facility-administered medications for this visit.     Review of Systems  Constitutional:  Constitutional negative. HENT: HENT negative.  Eyes: Eyes negative.  Respiratory: Positive for cough, shortness of breath and wheezing.  Cardiovascular: Positive for claudication and orthopnea.  GI: Gastrointestinal negative.  Musculoskeletal: Musculoskeletal negative.  Skin: Skin negative.  Neurological: Neurological negative. Hematologic: Hematologic/lymphatic negative.  Psychiatric: Psychiatric  negative.        Objective:  Objective   Vitals:   04/29/18 0930 04/29/18 0932  BP: (!) 151/79 (!) 144/75  Pulse: 60   Resp: 18   SpO2: 99%   Weight: 144 lb 12.8 oz (65.7 kg)   Height: 4\' 11"  (1.499 m)    Body mass index is 29.25 kg/m.  Physical Exam HENT:     Head: Normocephalic.  Eyes:     Extraocular Movements: Extraocular movements intact.     Pupils: Pupils are equal, round, and reactive to light.  Neck:     Musculoskeletal: Normal range of motion and neck supple.     Vascular: No carotid bruit.  Cardiovascular:     Rate and Rhythm: Regular rhythm.     Pulses:          Radial pulses are 2+ on the right side and 2+ on the left side.       Dorsalis pedis pulses are 2+ on the right side and 2+ on the left side.       Posterior tibial pulses are 2+ on the right side and 2+ on the left side.     Heart sounds: Normal heart sounds. No murmur.  Pulmonary:     Effort: Pulmonary effort is normal.     Breath sounds: Normal breath sounds.  Abdominal:     Palpations: Abdomen is soft.  Musculoskeletal: Normal range of motion.        General: No swelling.  Skin:    General: Skin is warm and dry.  Neurological:     General: No focal deficit present.     Mental Status: She is alert.  Psychiatric:        Mood and Affect: Mood  normal.        Behavior: Behavior normal.        Thought Content: Thought content normal.        Judgment: Judgment normal.     Data: I reviewed the outside carotid duplex which demonstrated 70% bilateral carotid stenosis with PSP on the right 255 and EDV 46 and on the left 242 and EDV 45.  I have independently interpreted her carotid duplex today which demonstrates right-sided 40 to 59% stenosis with PSP 183 and EDV 45.  On the left she has 40 to 59% stenosis and calcified with PSV 195 and EDV 45 essentially unchanged from outside duplex.  I reviewed her CT scan with her which demonstrates heavy calcification at the left ICA bifurcation and 80% stenosis by NASCET criteria and on the right side she is approximately 60% stenosis and calcified plaque also there.     Assessment/Plan:     79 year old female with bilateral carotid artery stenosis and history of ischemic cardiomyopathy.  Putting all of her studies together with the EDVs that are quite low I would say she has less than 60% stenosis bilaterally and given that she has been asymptomatic from a carotid standpoint I would think medical management is her best option.  I did discuss with her the option of carotid endarterectomy as I do not think she would be a good stenting candidate given the heavy calcification.  At this time I will follow her up in 1 year with a repeat duplex in our office.  If at that time she is still 40 to 59% I think she can be followed with [facility] cardiovascular.  We did discuss the signs and symptoms of stroke and the need for emergent evaluation in that  instance.  She along with both of her daughters demonstrate very good understanding.     Waynetta Sandy MD [facility]

## 2018-05-03 DIAGNOSIS — H3321 Serous retinal detachment, right eye: Secondary | ICD-10-CM | POA: Diagnosis not present

## 2018-05-03 DIAGNOSIS — H5789 Other specified disorders of eye and adnexa: Secondary | ICD-10-CM | POA: Diagnosis not present

## 2018-05-03 DIAGNOSIS — H33011 Retinal detachment with single break, right eye: Secondary | ICD-10-CM | POA: Diagnosis not present

## 2018-05-03 DIAGNOSIS — H33021 Retinal detachment with multiple breaks, right eye: Secondary | ICD-10-CM | POA: Diagnosis not present

## 2018-05-04 DIAGNOSIS — H3321 Serous retinal detachment, right eye: Secondary | ICD-10-CM | POA: Diagnosis not present

## 2018-05-10 ENCOUNTER — Ambulatory Visit: Payer: Medicare HMO

## 2018-05-10 DIAGNOSIS — I2510 Atherosclerotic heart disease of native coronary artery without angina pectoris: Secondary | ICD-10-CM | POA: Diagnosis not present

## 2018-05-10 DIAGNOSIS — I509 Heart failure, unspecified: Secondary | ICD-10-CM

## 2018-05-13 ENCOUNTER — Ambulatory Visit: Payer: Self-pay | Admitting: Cardiology

## 2018-05-14 ENCOUNTER — Other Ambulatory Visit: Payer: Self-pay | Admitting: Cardiology

## 2018-05-18 ENCOUNTER — Encounter: Payer: Self-pay | Admitting: Internal Medicine

## 2018-05-18 ENCOUNTER — Other Ambulatory Visit: Payer: Self-pay | Admitting: Cardiology

## 2018-05-18 DIAGNOSIS — I6523 Occlusion and stenosis of bilateral carotid arteries: Secondary | ICD-10-CM

## 2018-05-20 DIAGNOSIS — H3321 Serous retinal detachment, right eye: Secondary | ICD-10-CM | POA: Diagnosis not present

## 2018-05-23 ENCOUNTER — Encounter: Payer: Self-pay | Admitting: Cardiology

## 2018-05-23 ENCOUNTER — Ambulatory Visit: Payer: Medicare HMO | Admitting: Cardiology

## 2018-05-23 VITALS — BP 159/81 | HR 75 | Ht 60.0 in | Wt 145.2 lb

## 2018-05-23 DIAGNOSIS — I255 Ischemic cardiomyopathy: Secondary | ICD-10-CM | POA: Diagnosis not present

## 2018-05-23 DIAGNOSIS — I6523 Occlusion and stenosis of bilateral carotid arteries: Secondary | ICD-10-CM | POA: Diagnosis not present

## 2018-05-23 DIAGNOSIS — I25810 Atherosclerosis of coronary artery bypass graft(s) without angina pectoris: Secondary | ICD-10-CM | POA: Diagnosis not present

## 2018-05-23 MED ORDER — ASPIRIN EC 81 MG PO TBEC: 81.0000 mg | DELAYED_RELEASE_TABLET | Freq: Every day | ORAL | 3 refills | Status: DC

## 2018-05-23 MED ORDER — BISOPROLOL FUMARATE 5 MG PO TABS: 7.5000 mg | ORAL_TABLET | Freq: Every day | ORAL | 3 refills | Status: DC

## 2018-05-23 NOTE — Progress Notes (Signed)
Patient is here for follow up visit.  Subjective:   _0  ID: Lynn Carey, female    DOB: 1940-05-21, 78 y.o.   MRN: [id number]  Chief Complaint  Patient presents with  . Coronary Artery Disease    Echo results, 6MTH fu   . Congestive Heart Failure    HPI  78 year old African-American female with coronary artery disease status post CABG 1997, PCI to SVG 06/23/17, ischemic cardiomyopathy EF 20%, hypertension, hyperlipidemia, bilateral stable moderate carotid stenoses, here for 3 month follow up.   Patient was recently seen by Dr. Donzetta Matters and felt to have moderate asymptomatic b/l carotid stenosis. Medical management and follow up was recommended. In spite of guideline directed medical therapy, patient's EF has stayed around 15-20%.  Patient works at [employer]. She walks 1- 1 1/2 miles without any significant shortness of breath, but does get fatigued. Breathing has improved compared to few months ago. She walks few steps upstairs at work, where she has to pace herself to avoid shortness of breath. She continues to have dry cough. She does have seasonal allergies.   She is still taking bisoprolol 5 mg daily, unlike 7.5 mg daily as recommended at the last visit. Blood pressure remains elevated.    Past Medical History:  Diagnosis Date  . Anginal pain ([place])    h/o, denies today   . Arthritis    hip - both, shot in L shoulder- 2 weeks ago  . Asthma   . Carotid artery occlusion   . CHF (congestive heart failure) ([place])   . Complication of anesthesia   . Coronary artery disease   . Diabetes mellitus without complication ([place])   . GERD (gastroesophageal reflux disease)   . Hypertension    followed by Dr. Einar Gip  . MYOCARDIAL INFARCTION 05/25/2007   Qualifier: History of  By: Milana Obey, Doroteo [place]    . Myocardial infarction ([place]) 1997  . PONV (postoperative nausea and vomiting)   . Shortness of breath     Past Surgical History:  Procedure Laterality Date  .  CARDIAC CATHETERIZATION  05/2012   see note  . CORONARY ARTERY BYPASS GRAFT  [address]  . CORONARY STENT INTERVENTION Right 06/18/2017   Procedure: CORONARY STENT INTERVENTION;  Surgeon: Adrian Prows, MD;  Location: [place] [facility];  Service: Cardiovascular;  Laterality: Right;  . EYE SURGERY     both eyes, laser procedure  . JOINT REPLACEMENT Left 12/13/12   hip  . LEFT HEART CATH AND CORONARY ANGIOGRAPHY N/A 11/25/2017   Procedure: LEFT HEART CATH AND CORONARY ANGIOGRAPHY;  Surgeon: [name], MD;  Location: [place] [facility];  Service: Cardiovascular;  Laterality: N/A;  . LEFT HEART CATH AND CORS/GRAFTS ANGIOGRAPHY N/A 06/18/2017   Procedure: LEFT HEART CATH AND CORS/GRAFTS ANGIOGRAPHY;  Surgeon: Adrian Prows, MD;  Location: [place] [facility];  Service: Cardiovascular;  Laterality: N/A;  . LEFT HEART CATHETERIZATION WITH CORONARY/GRAFT ANGIOGRAM N/A 04/21/2011   Procedure: LEFT HEART CATHETERIZATION WITH Beatrix Fetters;  Surgeon: Laverda Page, MD;  Location: [facility] CATH LAB;  Service: Cardiovascular;  Laterality: N/A;  . PERCUTANEOUS CORONARY INTERVENTION-BALLOON ONLY Right 05/12/2011   Procedure: PERCUTANEOUS CORONARY INTERVENTION-BALLOON ONLY;  Surgeon: Laverda Page, MD;  Location: [facility] CATH LAB;  Service: Cardiovascular;  Laterality: Right;  . TOTAL HIP ARTHROPLASTY Left 12/13/2012   Procedure: TOTAL HIP ARTHROPLASTY ANTERIOR APPROACH;  Surgeon: Hessie Dibble, MD;  Location: [place];  Service: Orthopedics;  Laterality: Left;  .  TOTAL HIP ARTHROPLASTY Right 01/30/2014   Procedure: TOTAL HIP ARTHROPLASTY ANTERIOR APPROACH;  Surgeon: Hessie Dibble, MD;  Location: [place];  Service: Orthopedics;  Laterality: Right;  . TUBAL LIGATION      Social History   Socioeconomic History  . Marital status: Widowed    Spouse name: Not on file  . Number of children: Not on file  . Years of education: Not on file  . Highest education level: Not on file  Occupational  History  . Not on file  Social Needs  . Financial resource strain: Not on file  . Food insecurity:    Worry: Not on file    Inability: Not on file  . Transportation needs:    Medical: Not on file    Non-medical: Not on file  Tobacco Use  . Smoking status: Former Smoker    Packs/day: 0.50    Years: 2.00    Pack years: 1.00    Types: Cigarettes    Last attempt to quit: 12/08/1983    Years since quitting: 34.4  . Smokeless tobacco: Never Used  Substance and Sexual Activity  . Alcohol use: No  . Drug use: No  . Sexual activity: Not on file  Lifestyle  . Physical activity:    Days per week: Not on file    Minutes per session: Not on file  . Stress: Not on file  Relationships  . Social connections:    Talks on phone: Not on file    Gets together: Not on file    Attends religious service: Not on file    Active member of club or organization: Not on file    Attends meetings of clubs or organizations: Not on file    Relationship status: Not on file  . Intimate partner violence:    Fear of current or ex partner: Not on file    Emotionally abused: Not on file    Physically abused: Not on file    Forced sexual activity: Not on file  Other Topics Concern  . Not on file  Social History Narrative  . Not on file    Current Outpatient Medications on File Prior to Visit  Medication Sig Dispense Refill  . ACETAMINOPHEN PO Take 650 mg by mouth every 6 (six) hours as needed for moderate pain or headache.     . albuterol (PROVENTIL HFA;VENTOLIN HFA) 108 (90 BASE) MCG/ACT inhaler Inhale 2 puffs into the lungs every 4 (four) hours as needed for wheezing or shortness of breath.    . budesonide-formoterol (SYMBICORT) 160-4.5 MCG/ACT inhaler Inhale 2 puffs into the lungs every 4 (four) hours as needed (wheezing).    . cetirizine (ZYRTEC) 10 MG tablet Take 10 mg by mouth at bedtime.    . Cholecalciferol (VITAMIN D) 2000 UNITS CAPS Take 2,000 Units by mouth daily.     . clopidogrel (PLAVIX) 75  MG tablet Take 1 tablet (75 mg total) by mouth daily. 30 tablet 11  . ezetimibe (ZETIA) 10 MG tablet Take 10 mg by mouth daily.    Marland Kitchen HYDROcodone-homatropine (HYDROMET) 5-1.5 MG/5ML syrup Take 5 mLs by mouth every 6 (six) hours as needed for cough. 120 mL 0  . isosorbide-hydrALAZINE (BIDIL) 20-37.5 MG per tablet Take 2 tablets by mouth 3 (three) times daily.     Marland Kitchen JANUVIA 100 MG tablet TAKE 1 TABLET EVERY DAY 90 tablet 0  . loratadine (CLARITIN) 10 MG tablet Take 10 mg by mouth daily.    . montelukast (SINGULAIR)  10 MG tablet Take 10 mg by mouth at bedtime.    . nitroGLYCERIN (NITROSTAT) 0.4 MG SL tablet Place 0.4 mg under the tongue every 5 (five) minutes as needed for chest pain.    . pantoprazole (PROTONIX) 40 MG tablet Take 1 tablet (40 mg total) by mouth daily. 90 tablet 1  . ranolazine (RANEXA) 500 MG 12 hr tablet Take 1,000 mg by mouth 2 (two) times daily.     . rosuvastatin (CRESTOR) 20 MG tablet TAKE 1 TABLET EVERY DAY 90 tablet 0  . sacubitril-valsartan (ENTRESTO) 49-51 MG Take 1 tablet by mouth 2 (two) times daily.    Marland Kitchen spironolactone (ALDACTONE) 50 MG tablet TAKE 1 TABLET BY MOUTH EVERY DAY 30 tablet 3  . temazepam (RESTORIL) 15 MG capsule Take 15 mg by mouth at bedtime as needed for sleep.  1   No current facility-administered medications on file prior to visit.     Cardiovascular studies:  EKG 05/23/2018: Sinus rhythm. Incomplete LBBB. Baseline artifact seen.   Echocardiogram 05/10/2018: Left ventricle cavity is normal in size. Severe decrease in global wall motion. Doppler evidence of grade II (pseudonormal) diastolic dysfunction, elevated LAP. LVEF 15-20%. Left atrial cavity is moderately dilated. Moderate (Grade II) mitral regurgitation. Mildly restricted mitral valve leaflets. Moderate tricuspid regurgitation. Estimated pulmonary artery systolic pressure 35  mmHg. Mild pulmonic regurgitation. No significant change compared to previous study on 06/02/2017.  Cath  11/25/2017: LM: 95% diffuse disease (Unchanged from prior cath in 06/2017) LAD: 100% ostially occluded (Unchanged from prior cath in 06/2017) LCx: 100% ostially occluded (Unchanged from prior cath in 06/2017) RCA: 100% known proximal occlusion (Not visualized today) LIMA-LAD: Patent with good distal flow SVG-RCA: Patent with atent ostial stent. No other significant stenosis. Distal RCA has moderate disease (Unchanged from prior cath in 06/2017). RCA to LCx collaterals seen   LVEDP 16-20 mmHg  Recommendation: While the patient may conceivably have some area of unprotected myocardium supplied by native circumflex, most of it seems collaterals from the distal RCA. Left main is nearly occluded, but has a robust LIMA to LAD graft which is patent. Essentially, there is no significant change in angiographic findings compared to post PCI images in 06/2017.  I suspect patient's pain may be due to small vessel disease or perhaps related to pericarditis, given her recent viral infection. I will obtain an echocardiogram before discharge. Recommend current antianginal and medical therapy for CAD, including increased dose of Ranexa to 1000 mg twice a day.  CT angiogram neck 02/01/2018: 80% stenosis and left internal carotid artery with dense calcification 60% stenosis and right internal carotid artery with calcified and noncalcified plaque Scattered atherosclerotic calcifications involving vertebral arteries bilaterally without other significant stenoses  Incidental finding of grade 1 anterolisthesis C2-C3, C3-C4. 4 mm thyroid nodule, and coronary artery disease  Labs 03/21/18: GLucose 93. BUN/Cr 17/1.46. eGFR 40. Na/K 140/4.5  Labs 02/18/2018: Glucose 99. BUN/Cr 22/1.21. eGFR 50. Na/K 138/4.7   Review of Systems  Constitution: Positive for malaise/fatigue. Negative for decreased appetite, weight gain and weight loss.  HENT: Negative for congestion.   Eyes: Negative for visual disturbance.    Cardiovascular: Positive for dyspnea on exertion. Negative for leg swelling, palpitations and syncope.  Respiratory: Positive for shortness of breath.   Endocrine: Negative for cold intolerance.  Hematologic/Lymphatic: Does not bruise/bleed easily.  Skin: Negative for itching and rash.  Musculoskeletal: Negative for myalgias.  Gastrointestinal: Negative for abdominal pain, nausea and vomiting.  Genitourinary: Negative for dysuria.  Neurological: Negative for  dizziness and weakness.  Psychiatric/Behavioral: The patient is not nervous/anxious.   All other systems reviewed and are negative.      Objective:   Vitals:   05/23/18 0922  BP: (!) 159/81  Pulse: 75  SpO2: 98%     Physical Exam  Constitutional: She is oriented to person, place, and time. She appears well-developed and well-nourished. No distress.  HENT:  Head: Normocephalic and atraumatic.  Eyes: Pupils are equal, round, and reactive to light. Conjunctivae are normal.  Neck: No JVD present.  Cardiovascular: Normal rate and regular rhythm.  Pulses:      Carotid pulses are on the right side with bruit and on the left side with bruit.      Dorsalis pedis pulses are 1+ on the right side and 1+ on the left side.       Posterior tibial pulses are 0 on the right side and 0 on the left side.  Pulmonary/Chest: Effort normal and breath sounds normal. She has no wheezes. She has no rales.  Abdominal: Soft. Bowel sounds are normal. There is no rebound.  Musculoskeletal:        General: No edema.  Lymphadenopathy:    She has no cervical adenopathy.  Neurological: She is alert and oriented to person, place, and time. No cranial nerve deficit.  Skin: Skin is warm and dry.  Psychiatric: She has a normal mood and affect.  Nursing note and vitals reviewed.       Assessment & Recommendations:   78 year old African-American female with coronary artery disease status post CABG 1997, PCI to SVG 06/23/17, ischemic cardiomyopathy EF  20%, hypertension, hyperlipidemia, bilateral stable moderate carotid stenoses, here for 3 month follow up.    1. Coronary artery disease involving coronary bypass graft of native heart without angina pectoris Stable. No recurrent chest pain. Severe native vessel disease with occluded native arteries and ostial or proximal areas. Patent LIMA-LAD, SVG-RCA grafts with RCA to left circumflex collaterals. Diffuse distal RCA artery disease. Okay to contnue aspirin and plavix given complex CAD and absence of bleeding. Reduce Aspirin to 81 mg daily.  Continue current antianginal therapy  2. Ischemic cardiomyopathy HFrEF NYHA class II-III symptoms.  EF remains 15-20% on guideline directed heart failure therapy.  Continue Entresto to 49-51 mg bid, spironolactone 50 mg daily. Increase bisoprolol to 7.5 mg daily. Will add Corlanor in the future. I may not be able to increase Entresto further given her eGFR around 40.  Given that EF has remained low in spite of moderate doses of guideline directed medical therapy, she may be a candidate for ICD. Her QRS duration is just about 120 msec. Defer to EP re: consideration for CRT.  Patient is currently enrolled in HEART-FID clinical trial.  She may be a candidate for SGLT2 inhibitors given her CAD and HFrEF. She is currently having difficulties getting her Januvia due to cost and does not want to try additional medications at this time.   Carotid arterry stenoses: Seen by vascular surgery Dr. Donzetta Matters. Though to have moderate stable disease. Continue medical management.   Management of stable anemia, CKD, DM as per PCP.  [name], MD [facility] Pager: [phone] Office: [phone] If no answer Cell [phone]

## 2018-05-25 ENCOUNTER — Telehealth: Payer: Self-pay

## 2018-05-25 ENCOUNTER — Encounter: Payer: Self-pay | Admitting: Cardiology

## 2018-05-25 NOTE — Telephone Encounter (Signed)
Pt daughter called stating that she wasn't with her in her last OV with you and she wants to know why does she need ICD and what exactly is going on ?

## 2018-05-26 NOTE — Telephone Encounter (Signed)
I talked to patient's daughter and explained by recommendation of consideration of ICD, and possibly BIV ICD for cardiac resynchronization therapy, given persistent ischemic cardiomyopathy on guideline direct medical therapy.  [name], MD [facility] Pager: [phone] Office: [phone] If no answer Cell [phone]

## 2018-06-02 ENCOUNTER — Encounter: Payer: Self-pay | Admitting: Internal Medicine

## 2018-06-02 ENCOUNTER — Ambulatory Visit: Payer: Medicare HMO | Admitting: Internal Medicine

## 2018-06-02 VITALS — BP 156/80 | HR 70 | Ht 60.0 in | Wt 145.8 lb

## 2018-06-02 DIAGNOSIS — I255 Ischemic cardiomyopathy: Secondary | ICD-10-CM | POA: Diagnosis not present

## 2018-06-02 DIAGNOSIS — I25810 Atherosclerosis of coronary artery bypass graft(s) without angina pectoris: Secondary | ICD-10-CM | POA: Diagnosis not present

## 2018-06-02 NOTE — Progress Notes (Signed)
ELECTROPHYSIOLOGY CONSULT NOTE  Patient ID: Lynn Carey, MRN: [id number], DOB/AGE: 10-Jan-1941 78 y.o. Admit date: (Not on file) Date of Consult: 06/02/2018  Primary Physician: Glendale Chard, MD Primary Cardiologist: MP     Merrianne Mccumbers Checketts is a 78 y.o. female who is being seen today for the evaluation of ICD at the request of MP.    HPI Lynn Carey is a 78 y.o. female with a history of coronary artery disease with remote CABG [place] and PCI to a vein graft 3/19.  She is referred for consideration of an ICD.  She has had some palpitations.  No history of syncope.  She has significant shortness of breath, president less than 100 feet.  She has 3-5 pillow orthopnea and some nocturnal dyspnea.  She has a chronic cough and is on Singulair.  She has never seen pulmonary.  More recently over the last 3 weeks, she has had recurrent exertional chest tightness.  This is reminiscent of what prompted her catheterization/PCI 3/19 and the symptoms of which were relieved at that time by PCI.  She has longstanding poorly controlled hypertension.  I failed to ask her that I remember any way regarding sleep apnea  Past Medical History:  Diagnosis Date  . Anginal pain ([place])    h/o, denies today   . Arthritis    hip - both, shot in L shoulder- 2 weeks ago  . Asthma   . Carotid artery occlusion   . CHF (congestive heart failure) ([place])   . Complication of anesthesia   . Coronary artery disease   . Diabetes mellitus without complication ([place])   . GERD (gastroesophageal reflux disease)   . Hypertension    followed by Dr. Einar Gip  . MYOCARDIAL INFARCTION 05/25/2007   Qualifier: History of  By: Lynn Carey, Doroteo Bradford    . Myocardial infarction ([place]) 1997  . PONV (postoperative nausea and vomiting)   . Shortness of breath       Surgical History:  Past Surgical History:  Procedure Laterality Date  . CARDIAC CATHETERIZATION  05/2012   see note  . CORONARY ARTERY BYPASS GRAFT   [address]  . CORONARY STENT INTERVENTION Right 06/18/2017   Procedure: CORONARY STENT INTERVENTION;  Surgeon: Adrian Prows, MD;  Location: [facility];  Service: Cardiovascular;  Laterality: Right;  . EYE SURGERY     both eyes, laser procedure  . JOINT REPLACEMENT Left 12/13/12   hip  . LEFT HEART CATH AND CORONARY ANGIOGRAPHY N/A 11/25/2017   Procedure: LEFT HEART CATH AND CORONARY ANGIOGRAPHY;  Surgeon: [name], MD;  Location: [facility];  Service: Cardiovascular;  Laterality: N/A;  . LEFT HEART CATH AND CORS/GRAFTS ANGIOGRAPHY N/A 06/18/2017   Procedure: LEFT HEART CATH AND CORS/GRAFTS ANGIOGRAPHY;  Surgeon: Adrian Prows, MD;  Location: [place];  Service: Cardiovascular;  Laterality: N/A;  . LEFT HEART CATHETERIZATION WITH CORONARY/GRAFT ANGIOGRAM N/A 04/21/2011   Procedure: LEFT HEART CATHETERIZATION WITH Beatrix Fetters;  Surgeon: Laverda Page, MD;  Location: [facility] CATH LAB;  Service: Cardiovascular;  Laterality: N/A;  . PERCUTANEOUS CORONARY INTERVENTION-BALLOON ONLY Right 05/12/2011   Procedure: PERCUTANEOUS CORONARY INTERVENTION-BALLOON ONLY;  Surgeon: Laverda Page, MD;  Location: [facility] CATH LAB;  Service: Cardiovascular;  Laterality: Right;  . TOTAL HIP ARTHROPLASTY Left 12/13/2012   Procedure: TOTAL HIP ARTHROPLASTY ANTERIOR APPROACH;  Surgeon: Hessie Dibble, MD;  Location: [facility];  Service: Orthopedics;  Laterality: Left;  .  TOTAL HIP ARTHROPLASTY Right 01/30/2014   Procedure: TOTAL HIP ARTHROPLASTY ANTERIOR APPROACH;  Surgeon: Hessie Dibble, MD;  Location: [place];  Service: Orthopedics;  Laterality: Right;  . TUBAL LIGATION       Home Meds: Current Meds  Medication Sig  . ACETAMINOPHEN PO Take 650 mg by mouth every 6 (six) hours as needed for moderate pain or headache.   . albuterol (PROVENTIL HFA;VENTOLIN HFA) 108 (90 BASE) MCG/ACT inhaler Inhale 2 puffs into the lungs every 4 (four) hours as needed for wheezing or shortness  of breath.  Marland Kitchen aspirin EC 81 MG tablet Take 1 tablet (81 mg total) by mouth daily.  . bisoprolol (ZEBETA) 5 MG tablet Take 1.5 tablets (7.5 mg total) by mouth daily.  . budesonide-formoterol (SYMBICORT) 160-4.5 MCG/ACT inhaler Inhale 2 puffs into the lungs every 4 (four) hours as needed (wheezing).  . cetirizine (ZYRTEC) 10 MG tablet Take 10 mg by mouth at bedtime.  . Cholecalciferol (VITAMIN D) 2000 UNITS CAPS Take 2,000 Units by mouth daily.   . clopidogrel (PLAVIX) 75 MG tablet Take 1 tablet (75 mg total) by mouth daily.  Marland Kitchen ezetimibe (ZETIA) 10 MG tablet Take 10 mg by mouth daily.  Marland Kitchen HYDROcodone-homatropine (HYDROMET) 5-1.5 MG/5ML syrup Take 5 mLs by mouth every 6 (six) hours as needed for cough.  . isosorbide-hydrALAZINE (BIDIL) 20-37.5 MG per tablet Take 2 tablets by mouth 3 (three) times daily.   Marland Kitchen JANUVIA 100 MG tablet TAKE 1 TABLET EVERY DAY  . loratadine (CLARITIN) 10 MG tablet Take 10 mg by mouth daily.  . montelukast (SINGULAIR) 10 MG tablet Take 10 mg by mouth at bedtime.  . nitroGLYCERIN (NITROSTAT) 0.4 MG SL tablet Place 0.4 mg under the tongue every 5 (five) minutes as needed for chest pain.  . pantoprazole (PROTONIX) 40 MG tablet Take 1 tablet (40 mg total) by mouth daily.  . ranolazine (RANEXA) 500 MG 12 hr tablet Take 1,000 mg by mouth 2 (two) times daily.   . rosuvastatin (CRESTOR) 20 MG tablet TAKE 1 TABLET EVERY DAY  . sacubitril-valsartan (ENTRESTO) 49-51 MG Take 1 tablet by mouth 2 (two) times daily.  Marland Kitchen spironolactone (ALDACTONE) 50 MG tablet TAKE 1 TABLET BY MOUTH EVERY DAY  . temazepam (RESTORIL) 15 MG capsule Take 15 mg by mouth at bedtime as needed for sleep.    Allergies:  Allergies  Allergen Reactions  . Lisinopril Cough    Social History   Socioeconomic History  . Marital status: Widowed    Spouse name: Not on file  . Number of children: Not on file  . Years of education: Not on file  . Highest education level: Not on file  Occupational History  . Not  on file  Social Needs  . Financial resource strain: Not on file  . Food insecurity:    Worry: Not on file    Inability: Not on file  . Transportation needs:    Medical: Not on file    Non-medical: Not on file  Tobacco Use  . Smoking status: Former Smoker    Packs/day: 0.50    Years: 2.00    Pack years: 1.00    Types: Cigarettes    Last attempt to quit: 12/08/1983    Years since quitting: 34.5  . Smokeless tobacco: Never Used  Substance and Sexual Activity  . Alcohol use: No  . Drug use: No  . Sexual activity: Not on file  Lifestyle  . Physical activity:    Days per week:  Not on file    Minutes per session: Not on file  . Stress: Not on file  Relationships  . Social connections:    Talks on phone: Not on file    Gets together: Not on file    Attends religious service: Not on file    Active member of club or organization: Not on file    Attends meetings of clubs or organizations: Not on file    Relationship status: Not on file  . Intimate partner violence:    Fear of current or ex partner: Not on file    Emotionally abused: Not on file    Physically abused: Not on file    Forced sexual activity: Not on file  Other Topics Concern  . Not on file  Social History Narrative  . Not on file     Family History  Problem Relation Age of Onset  . Allergies Mother   . Asthma Mother   . Heart disease Mother   . Heart attack Mother   . Allergies Son   . Asthma Son   . Heart disease Father   . Heart attack Father   . Breast cancer Daughter      ROS:  Please see the history of present illness.     All other systems reviewed and negative.    Physical Exam: Blood pressure (!) 156/80, pulse 70, height 5' (1.524 m), weight 145 lb 12.8 oz (66.1 kg), SpO2 99 %. General: Well developed, well nourished female in no acute distress. Head: Normocephalic, atraumatic, sclera non-icteric, no xanthomas, nares are without discharge. EENT: normal  Lymph Nodes:  none Neck: Negative for  carotid bruits. JVD 8/10. Back:without scoliosis kyphosis Lungs: Clear bilaterally to auscultation without wheezes, rales, or rhonchi. Breathing is unlabored. Heart: RRR with S1 S2. 2/6 systolic murmur . No rubs, or gallops appreciated. Abdomen: Soft, non-tender, non-distended with normoactive bowel sounds. No hepatomegaly. No rebound/guarding. No obvious abdominal masses. Msk:  Strength and tone appear normal for age. Extremities: No clubbing or cyanosis. No edema.  Distal pedal pulses are 2+ and equal bilaterally. Skin: Warm and Dry Neuro: Alert and oriented X 3. CN III-XII intact Grossly normal sensory and motor function . Psych:  Responds to questions appropriately with a normal affect.      Labs: Cardiac Enzymes No results for input(s): CKTOTAL, CKMB, TROPONINI in the last 72 hours. CBC Lab Results  Component Value Date   WBC 5.9 11/25/2017   HGB 9.8 (L) 11/25/2017   HCT 31.9 (L) 11/25/2017   MCV 96.4 11/25/2017   PLT 317 11/25/2017   PROTIME: No results for input(s): LABPROT, INR in the last 72 hours. Chemistry No results for input(s): NA, K, CL, CO2, BUN, CREATININE, CALCIUM, PROT, BILITOT, ALKPHOS, ALT, AST, GLUCOSE in the last 168 hours.  Invalid input(s): LABALBU Lipids Lab Results  Component Value Date   CHOL 179 03/21/2018   HDL 60 03/21/2018   LDLCALC 101 (H) 03/21/2018   TRIG 89 03/21/2018   BNP No results found for: PROBNP Thyroid Function Tests: No results for input(s): TSH, T4TOTAL, T3FREE, THYROIDAB in the last 72 hours.  Invalid input(s): FREET3 Miscellaneous No results found for: DDIMER  Radiology/Studies:  No results found.  EKG: Sinus at 70 Interval 20/11/42 Left axis deviation -37 Nonspecific ST-T changes   Assessment and Plan:  Ischemic cardiomyopathy  Exertional angina-unstable  Congestive heart failure-chronic-systolic/diastolic class III  COPD/reactive airways disease  Hypertension-inadequately controlled  Left arm  numbness   The patient has  persistent left ventricular dysfunction despite guideline directed medical therapy.  It is reasonable to consider ICD implantation for primary prevention, this based on the recent meta-analysis looking at the advantages of a defibrillator in the elderly.  I have reviewed this caveat with the patient and her daughter.  However, she has poorly controlled blood pressure and would suggest that we increase her Entresto and add back amlodipine as this may help her functional status.  In addition, reactive airways is likely contributing to her dyspnea and she may benefit from pulmonary consultation.  Most importantly is that she is having recurrences of her exertional angina that was associated with stenting and relieved by stenting 3/19.  Prior to proceeding with ICD implantation, talk with Dr. NP regarding repeat catheterization.  She will also need MRI prior to proceeding to exclude TIA/stroke    Virl Axe

## 2018-06-02 NOTE — Patient Instructions (Signed)
Medication Instructions:  Your physician recommends that you continue on your current medications as directed. Please refer to the Current Medication list given to you today.  Labwork: None ordered.  Testing/Procedures: None ordered.  Follow-Up: Your physician recommends that you schedule a follow-up appointment as needed with Dr Klein  Any Other Special Instructions Will Be Listed Below (If Applicable).     If you need a refill on your cardiac medications before your next appointment, please call your pharmacy.  

## 2018-06-08 ENCOUNTER — Telehealth: Payer: Self-pay | Admitting: Internal Medicine

## 2018-06-08 NOTE — Telephone Encounter (Signed)
Called to schedule Medicare Annual Wellness Visit with the Nurse Health Advisor. No answer at Home/Mobile number and couldn't leave message because the mailbox is full.  If patient returns call, please note: their last AWV was on 06/25/2017, please schedule AWV with NHA any date AFTER 06/26/2018.  Thank you! For any questions please contact: Janace Hoard at [phone] or Skype [email or web address]

## 2018-06-17 ENCOUNTER — Encounter: Payer: Self-pay | Admitting: Nurse Practitioner

## 2018-06-17 ENCOUNTER — Encounter: Payer: Self-pay | Admitting: Internal Medicine

## 2018-06-17 ENCOUNTER — Other Ambulatory Visit: Payer: Self-pay

## 2018-06-17 ENCOUNTER — Ambulatory Visit (INDEPENDENT_AMBULATORY_CARE_PROVIDER_SITE_OTHER): Payer: Medicare HMO | Admitting: Internal Medicine

## 2018-06-17 VITALS — BP 120/70 | HR 80 | Temp 98.1°F | Ht <= 58 in | Wt 142.8 lb

## 2018-06-17 DIAGNOSIS — M791 Myalgia, unspecified site: Secondary | ICD-10-CM | POA: Diagnosis not present

## 2018-06-17 DIAGNOSIS — R05 Cough: Secondary | ICD-10-CM | POA: Diagnosis not present

## 2018-06-17 DIAGNOSIS — R6889 Other general symptoms and signs: Secondary | ICD-10-CM

## 2018-06-17 DIAGNOSIS — J40 Bronchitis, not specified as acute or chronic: Secondary | ICD-10-CM

## 2018-06-17 DIAGNOSIS — R509 Fever, unspecified: Secondary | ICD-10-CM | POA: Diagnosis not present

## 2018-06-17 DIAGNOSIS — R062 Wheezing: Secondary | ICD-10-CM

## 2018-06-17 MED ORDER — PREDNISONE 20 MG PO TABS: 20.0000 mg | ORAL_TABLET | Freq: Every day | ORAL | 0 refills | Status: DC

## 2018-06-17 MED ORDER — AZITHROMYCIN 250 MG PO TABS: ORAL_TABLET | ORAL | 0 refills | Status: AC

## 2018-06-17 NOTE — Progress Notes (Addendum)
Subjective:     Patient ID: Lynn Carey , female    DOB: 1941/03/29 , 78 y.o.   MRN: [id number]   Chief Complaint  Patient presents with  . Cough    asthma/brochitis/mucus/sore throat/sinus pressure/runny eye nose    HPI  Onset of URI x 5 days , has had stuffy ears as well.  Had low grade temp for 48h 99-101, when she started it, then resolved. No fever yesteday. Had body aches and HA though. Has been wheezing. Cough is productive with purulent mucous.   Past Medical History:  Diagnosis Date  . Anginal pain ([place])    h/o, denies today   . Arthritis    hip - both, shot in L shoulder- 2 weeks ago  . Asthma   . Carotid artery occlusion   . CHF (congestive heart failure) ([place])   . Complication of anesthesia   . Coronary artery disease   . Diabetes mellitus without complication ([place])   . GERD (gastroesophageal reflux disease)   . Hypertension    followed by Dr. Einar Gip  . MYOCARDIAL INFARCTION 05/25/2007   Qualifier: History of  By: Milana Obey, Doroteo Bradford    . Myocardial infarction ([place]) 1997  . PONV (postoperative nausea and vomiting)   . Shortness of breath      Family History  Problem Relation Age of Onset  . Allergies Mother   . Asthma Mother   . Heart disease Mother   . Heart attack Mother   . Allergies Son   . Asthma Son   . Heart disease Father   . Heart attack Father   . Breast cancer Daughter      Current Outpatient Medications:  .  ACETAMINOPHEN PO, Take 650 mg by mouth every 6 (six) hours as needed for moderate pain or headache. , Disp: , Rfl:  .  albuterol (PROVENTIL HFA;VENTOLIN HFA) 108 (90 BASE) MCG/ACT inhaler, Inhale 2 puffs into the lungs every 4 (four) hours as needed for wheezing or shortness of breath., Disp: , Rfl:  .  aspirin EC 81 MG tablet, Take 1 tablet (81 mg total) by mouth daily., Disp: 90 tablet, Rfl: 3 .  bisoprolol (ZEBETA) 5 MG tablet, Take 1.5 tablets (7.5 mg total) by mouth daily., Disp: 135 tablet, Rfl: 3 .  budesonide-formoterol  (SYMBICORT) 160-4.5 MCG/ACT inhaler, Inhale 2 puffs into the lungs every 4 (four) hours as needed (wheezing)., Disp: , Rfl:  .  cetirizine (ZYRTEC) 10 MG tablet, Take 10 mg by mouth at bedtime., Disp: , Rfl:  .  Cholecalciferol (VITAMIN D) 2000 UNITS CAPS, Take 2,000 Units by mouth daily. , Disp: , Rfl:  .  clopidogrel (PLAVIX) 75 MG tablet, Take 1 tablet (75 mg total) by mouth daily., Disp: 30 tablet, Rfl: 11 .  ezetimibe (ZETIA) 10 MG tablet, Take 10 mg by mouth daily., Disp: , Rfl:  .  isosorbide-hydrALAZINE (BIDIL) 20-37.5 MG per tablet, Take 2 tablets by mouth 3 (three) times daily. , Disp: , Rfl:  .  JANUVIA 100 MG tablet, TAKE 1 TABLET EVERY DAY, Disp: 90 tablet, Rfl: 0 .  loratadine (CLARITIN) 10 MG tablet, Take 10 mg by mouth daily., Disp: , Rfl:  .  montelukast (SINGULAIR) 10 MG tablet, Take 10 mg by mouth at bedtime., Disp: , Rfl:  .  nitroGLYCERIN (NITROSTAT) 0.4 MG SL tablet, Place 0.4 mg under the tongue every 5 (five) minutes as needed for chest pain., Disp: , Rfl:  .  pantoprazole (PROTONIX) 40 MG tablet, Take 1  tablet (40 mg total) by mouth daily., Disp: 90 tablet, Rfl: 1 .  ranolazine (RANEXA) 500 MG 12 hr tablet, Take 1,000 mg by mouth 2 (two) times daily. , Disp: , Rfl:  .  rosuvastatin (CRESTOR) 20 MG tablet, TAKE 1 TABLET EVERY DAY, Disp: 90 tablet, Rfl: 0 .  sacubitril-valsartan (ENTRESTO) 49-51 MG, Take 1 tablet by mouth 2 (two) times daily., Disp: , Rfl:  .  spironolactone (ALDACTONE) 50 MG tablet, TAKE 1 TABLET BY MOUTH EVERY DAY, Disp: 30 tablet, Rfl: 3 .  temazepam (RESTORIL) 15 MG capsule, Take 15 mg by mouth at bedtime as needed for sleep., Disp: , Rfl: 1 .  HYDROcodone-homatropine (HYDROMET) 5-1.5 MG/5ML syrup, Take 5 mLs by mouth every 6 (six) hours as needed for cough. (Patient not taking: Reported on 06/17/2018), Disp: 120 mL, Rfl: 0   Allergies  Allergen Reactions  . Lisinopril Cough     Review of Systems  Constitutional: Positive for chills, fatigue and  fever. Negative for appetite change and diaphoresis.  HENT: Positive for congestion, postnasal drip and rhinorrhea. Negative for ear discharge and ear pain.   Respiratory: Positive for cough and wheezing. Negative for chest tightness and shortness of breath.   Cardiovascular: Positive for chest pain.  Gastrointestinal: Negative for nausea and vomiting.  Genitourinary: Negative for difficulty urinating.  Musculoskeletal: Positive for myalgias. Negative for gait problem.  Skin: Negative for rash.  Neurological: Negative for headaches.  Hematological: Negative for adenopathy.     Today's Vitals   06/17/18 1037  BP: 120/70  Pulse: 80  Temp: 98.1 F (36.7 C)  TempSrc: Oral  SpO2: 98%  Weight: 142 lb 12.8 oz (64.8 kg)  Height: 4' 9.4" (1.458 m)   Body mass index is 30.47 kg/m.   Objective:  Physical Exam  Alert, well kept in NAD EYES- non icterus, conjunctiva's are normal.  ENT- external ears and nose are normal, TM's gray and shiny, Nose with clear mucous and            Mild congestion bilaterally. Throat- clear. No sinus tenderness.  NECK- supple with no nodes LUNG- has scattered wheezing throughout, resolved after neb treatment.  HEART- RRR with no murmurs SKIN- non jaundiced, no rashes or ecchymosis.     Assessment And Plan:   1. Bronchitis- acute. Placed on Zpack and Albuterol    inhaler.   2. Flu-like symptoms- acute.  - POCT Influenza A/B- negative.     Fu if she gets worse in 72h.     Amyia Lodwick RODRIGUEZ-SOUTHWORTH, PA-C

## 2018-06-20 DIAGNOSIS — H3321 Serous retinal detachment, right eye: Secondary | ICD-10-CM | POA: Diagnosis not present

## 2018-06-21 LAB — POCT INFLUENZA A/B
Influenza A, POC: NEGATIVE
Influenza B, POC: NEGATIVE

## 2018-06-22 ENCOUNTER — Other Ambulatory Visit: Payer: Self-pay

## 2018-06-22 ENCOUNTER — Ambulatory Visit: Payer: Medicare HMO

## 2018-06-22 DIAGNOSIS — I6523 Occlusion and stenosis of bilateral carotid arteries: Secondary | ICD-10-CM

## 2018-06-26 ENCOUNTER — Other Ambulatory Visit: Payer: Self-pay | Admitting: Cardiology

## 2018-06-26 DIAGNOSIS — I6523 Occlusion and stenosis of bilateral carotid arteries: Secondary | ICD-10-CM

## 2018-06-28 ENCOUNTER — Telehealth: Payer: Self-pay

## 2018-06-28 ENCOUNTER — Other Ambulatory Visit: Payer: Self-pay | Admitting: Nurse Practitioner

## 2018-06-28 DIAGNOSIS — R6889 Other general symptoms and signs: Secondary | ICD-10-CM

## 2018-06-28 NOTE — Telephone Encounter (Signed)
Called pt to confirm that grandson had been diagnosed with covid19. Pt stated that her grandson who is 78 years old received his results that he was positive for covid19 today. Pt stated that he was currently in the hospital. I asked pt how she was since her last visit and she said that she had improved but still had all symptoms that she had at he last appt on 06/17/18. Pt was informed that we would call her back with further instructions to be tested since she has been exposed. Pt seemed understanding and willing to comply.

## 2018-06-28 NOTE — Progress Notes (Signed)
Schedule eVisit

## 2018-06-28 NOTE — Addendum Note (Signed)
Addended by: Minette Brine F on: 06/28/2018 01:18 PM   Modules accepted: Orders

## 2018-06-28 NOTE — Progress Notes (Signed)
covi

## 2018-07-13 NOTE — Progress Notes (Signed)
Scheduled for 07/14/2018 @ 2:00pm

## 2018-07-14 ENCOUNTER — Ambulatory Visit (INDEPENDENT_AMBULATORY_CARE_PROVIDER_SITE_OTHER): Payer: Medicare HMO | Admitting: Cardiology

## 2018-07-14 ENCOUNTER — Encounter: Payer: Self-pay | Admitting: Cardiology

## 2018-07-14 ENCOUNTER — Other Ambulatory Visit: Payer: Self-pay

## 2018-07-14 VITALS — BP 159/81 | HR 75 | Ht <= 58 in | Wt 142.0 lb

## 2018-07-14 DIAGNOSIS — I6523 Occlusion and stenosis of bilateral carotid arteries: Secondary | ICD-10-CM | POA: Diagnosis not present

## 2018-07-14 DIAGNOSIS — I25810 Atherosclerosis of coronary artery bypass graft(s) without angina pectoris: Secondary | ICD-10-CM

## 2018-07-14 DIAGNOSIS — I255 Ischemic cardiomyopathy: Secondary | ICD-10-CM

## 2018-07-14 DIAGNOSIS — R05 Cough: Secondary | ICD-10-CM

## 2018-07-14 DIAGNOSIS — R053 Chronic cough: Secondary | ICD-10-CM
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
## 2018-07-21 ENCOUNTER — Ambulatory Visit (INDEPENDENT_AMBULATORY_CARE_PROVIDER_SITE_OTHER): Payer: Medicare HMO

## 2018-07-21 ENCOUNTER — Other Ambulatory Visit: Payer: Self-pay

## 2018-07-21 ENCOUNTER — Encounter: Payer: Self-pay | Admitting: Internal Medicine

## 2018-07-21 ENCOUNTER — Ambulatory Visit (INDEPENDENT_AMBULATORY_CARE_PROVIDER_SITE_OTHER): Payer: Medicare HMO | Admitting: Internal Medicine

## 2018-07-21 VITALS — BP 136/84 | HR 67 | Temp 97.6°F | Ht <= 58 in | Wt 140.0 lb

## 2018-07-21 DIAGNOSIS — J301 Allergic rhinitis due to pollen: Secondary | ICD-10-CM | POA: Diagnosis not present

## 2018-07-21 DIAGNOSIS — Z0000 Encounter for general adult medical examination without abnormal findings: Secondary | ICD-10-CM

## 2018-07-21 DIAGNOSIS — Z23 Encounter for immunization: Secondary | ICD-10-CM

## 2018-07-21 DIAGNOSIS — I25119 Atherosclerotic heart disease of native coronary artery with unspecified angina pectoris: Secondary | ICD-10-CM

## 2018-07-21 DIAGNOSIS — Z6830 Body mass index (BMI) 30.0-30.9, adult: Secondary | ICD-10-CM

## 2018-07-21 DIAGNOSIS — E6609 Other obesity due to excess calories: Secondary | ICD-10-CM | POA: Diagnosis not present

## 2018-07-21 DIAGNOSIS — N182 Chronic kidney disease, stage 2 (mild): Secondary | ICD-10-CM

## 2018-07-21 DIAGNOSIS — E1122 Type 2 diabetes mellitus with diabetic chronic kidney disease: Secondary | ICD-10-CM

## 2018-07-21 DIAGNOSIS — I1310 Hypertensive heart and chronic kidney disease without heart failure, with stage 1 through stage 4 chronic kidney disease, or unspecified chronic kidney disease: Secondary | ICD-10-CM

## 2018-07-21 LAB — POCT URINALYSIS DIPSTICK
Bilirubin, UA: NEGATIVE
Glucose, UA: NEGATIVE
Ketones, UA: NEGATIVE
Leukocytes, UA: NEGATIVE
Nitrite, UA: NEGATIVE
Protein, UA: POSITIVE — AB
Spec Grav, UA: 1.025 (ref 1.010–1.025)
Urobilinogen, UA: 0.2 E.U./dL
pH, UA: 5.5 (ref 5.0–8.0)

## 2018-07-21 LAB — POCT UA - MICROALBUMIN
Creatinine, POC: 300 mg/dL
Microalbumin Ur, POC: 150 mg/L

## 2018-07-21 MED ORDER — PNEUMOCOCCAL 13-VAL CONJ VACC IM SUSP: 0.5000 mL | INTRAMUSCULAR | 0 refills | Status: AC

## 2018-07-21 NOTE — Progress Notes (Signed)
Subjective:   Lynn Carey is a 78 y.o. female who presents for Medicare Annual (Subsequent) preventive examination.  Review of Systems:  n/a Cardiac Risk Factors include: advanced age (>25mn, >>13women);hypertension;diabetes mellitus;obesity (BMI >30kg/m2)     Objective:     Vitals: BP 136/84 (BP Location: Left Arm, Patient Position: Sitting)    Pulse 67    Temp 97.6 F (36.4 C) (Oral)    Ht _0  (1.448 m)    Wt 140 lb (63.5 kg)    SpO2 94%    BMI 30.30 kg/m   Body mass index is 30.3 kg/m.  Advanced Directives 07/21/2018 04/29/2018 06/18/2017 02/05/2015 01/22/2014 12/15/2012 12/13/2012  Does Patient Have a Medical Advance Directive? Yes Yes No Yes Yes Patient has advance directive, copy not in chart -  Type of Advance Directive HAndersonLiving will HLynchLiving will - - - HPress photographer-  Does patient want to make changes to medical advance directive? - No - Patient declined - No - Patient declined - - -  Copy of HTequestain Chart? No - copy requested No - copy requested - Yes - Copy requested from family -  Would patient like information on creating a medical advance directive? - - No - Patient declined - - - -  Pre-existing out of facility DNR order (yellow form or pink MOST form) - - - - - No No    Tobacco Social History   Tobacco Use  Smoking Status Former Smoker   Packs/day: 0.50   Years: 2.00   Pack years: 1.00   Types: Cigarettes   Last attempt to quit: 12/08/1983   Years since quitting: 34.6  Smokeless Tobacco Never Used     Counseling given: Not Answered   Clinical Intake:  Pre-visit preparation completed: Yes  Pain : No/denies pain Pain Score: 0-No pain     Nutritional Status: BMI > 30  Obese Nutritional Risks: None Diabetes: Yes CBG done?: No Did pt. bring in CBG monitor from home?: No  How often do you need to have someone help you when you read instructions,  pamphlets, or other written materials from your doctor or pharmacy?: 1 - Never What is the last grade level you completed in school?: some college  Interpreter Needed?: No  Information entered by :: NAllen LPN  Past Medical History:  Diagnosis Date   [place]    h/[place], denies today    Arthritis    hip - both, shot in L shoulder- 2 weeks ago   Asthma    Carotid artery occlusion    [place]    Complication of anesthesia    Coronary artery disease    Di[place] mellitus [place] complication [place] [facility]    GERD (gastroesophageal reflux disease)    Hypertension    followed by Dr. GEinar Gip  MYOCARDIAL INFARCTION 05/25/2007   Qualifier: History of  By: JWynetta EmeryRN, Erika     Myocardial infarction ([place] 1997   PONV (postoperative nausea and vomiting)    Shortness of breath    Past Surgical History:  Procedure Laterality Date   CARDIAC CATHETERIZATION  05/2012   see note   [place]ONARY ARTERY BYPASS GRAFT  [place] 06[place]   Procedure: CORONARY STENT INTERVENTION;  Surgeon: GAdrian Prows MD;  [facility];  Service: Cardiovascular;  Laterality: Right;   EYE SURGERY  both eyes, laser procedure   JOINT REPLACEMENT Left 12/13/12   hip   LEFT HEART CATH AND CORONARY ANGIOGRAPHY N/A 11/25/2017   Procedure: LEFT HEART CATH AND CORONARY ANGIOGRAPHY;  Surgeon: [name], MD;  Location: [place] [facility];  Service: Cardiovascular;  Laterality: N/A;   LEFT HEART CATH AND CORS/GRAFTS ANGIOGRAPHY N/A 06/18/2017   Procedure: LEFT HEART CATH AND CORS/GRAFTS ANGIOGRAPHY;  Surgeon: Adrian Prows, MD;  Location: [place] [facility];  Service: Cardiovascular;  Laterality: N/A;   LEFT HEART CATHETERIZATION WITH CORONARY/GRAFT ANGIOGRAM N/A 04/21/2011   Procedure: LEFT HEART CATHETERIZATION WITH Beatrix Fetters;  Surgeon: Laverda Page, MD;  Location[facility] LAB;  Service:  Cardiovascular;  Laterality: N/A;   PERCUTANEOUS CORONARY INTERVENTION-BALLOON ONLY Right 05/12/2011   Procedure: PERCUTANEOUS CORONARY INTERVENTION-BALLOON ONLY;  Surgeon: Laverda Page, MD;  Location[facility];  Service: Cardiovascular;  Laterality: Right;   TOTAL HIP ARTHROPLASTY Left 12/13/2012   Procedure: TOTAL HIP ARTHROPLASTY ANTERIOR APPROACH;  Surgeon: Hessie Dibble, MD;  Location: [place];  Service: Orthopedics;  Laterality: Left;   TOTAL HIP ARTHROPLASTY Right 01/30/2014   Procedure: TOTAL HIP ARTHROPLASTY ANTERIOR APPROACH;  Surgeon: Hessie Dibble, MD;  Location: [place];  Service: Orthopedics;  Laterality: Right;   TUBAL LIGATION     Family History  Problem Relation Age of Onset   Allergies Mother    Asthma Mother    Heart disease Mother    Heart attack Mother    Allergies Son    Asthma Son    Heart disease Father    Heart attack Father    Breast cancer Daughter    Social History   Socioeconomic History   Marital status: Widowed    Spouse name: Not on file   Number of children: Not on file   Years of education: Not on file   Highest education level: Not on file  Occupational History   Not on file  Social Needs   Financial resource strain: Not hard at all   Food insecurity:    Worry: Never true    Inability: Never true   Transportation needs:    Medical: No    Non-medical: No  Tobacco Use   Smoking status: Former Smoker    Packs/day: 0.50    Years: 2.00    Pack years: 1.00    Types: Cigarettes    Last attempt to quit: 12/08/1983    Years since quitting: 34.6   Smokeless tobacco: Never Used  Substance and Sexual Activity   Alcohol use: No   Drug use: No   Sexual activity: Not Currently  Lifestyle   Physical activity:    Days per week: 2 days    Minutes per session: 30 min   Stress: Not at all  Relationships   Social connections:    Talks on phone: Not on file    Gets together: Not on file    Attends religious  service: Not on file    Active member of club or organization: Not on file    Attends meetings of clubs or organizations: Not on file    Relationship status: Not on file  Other Topics Concern   Not on file  Social History Narrative   Not on file    Outpatient Encounter Medications as of 07/21/2018  Medication Sig   ACETAMINOPHEN PO Take 650 mg by mouth every 6 (six) hours as needed for moderate pain or headache.    albuterol (PROVENTIL HFA;VENTOLIN HFA)  108 (90 BASE) MCG/ACT inhaler Inhale 2 puffs into the lungs every 4 (four) hours as needed for wheezing or shortness of breath.   aspirin EC 81 MG tablet Take 1 tablet (81 mg total) by mouth daily.   bisoprolol (ZEBETA) 5 MG tablet Take 1.5 tablets (7.5 mg total) by mouth daily.   budesonide-formoterol (SYMBICORT) 160-4.5 MCG/ACT inhaler Inhale 2 puffs into the lungs every 4 (four) hours as needed (wheezing).   cetirizine (ZYRTEC) 10 MG tablet Take 10 mg by mouth at bedtime.   Cholecalciferol (VITAMIN D) 2000 UNITS CAPS Take 2,000 Units by mouth daily.    ezetimibe (ZETIA) 10 MG tablet Take 10 mg by mouth daily.   isosorbide-hydrALAZINE (BIDIL) 20-37.5 MG per tablet Take 2 tablets by mouth 3 (three) times daily.    JANUVIA 100 MG tablet TAKE 1 TABLET EVERY DAY   loratadine (CLARITIN) 10 MG tablet Take 10 mg by mouth daily.   montelukast (SINGULAIR) 10 MG tablet Take 10 mg by mouth at bedtime.   nitroGLYCERIN (NITROSTAT) 0.4 MG SL tablet Place 0.4 mg under the tongue every 5 (five) minutes as needed for chest pain.   pantoprazole (PROTONIX) 40 MG tablet Take 1 tablet (40 mg total) by mouth daily.   ranolazine (RANEXA) 500 MG 12 hr tablet Take 1,000 mg by mouth 2 (two) times daily.    rosuvastatin (CRESTOR) 20 MG tablet TAKE 1 TABLET EVERY DAY   sacubitril-valsartan (ENTRESTO) 49-51 MG Take 1 tablet by mouth 2 (two) times daily.   spironolactone (ALDACTONE) 50 MG tablet TAKE 1 TABLET BY MOUTH EVERY DAY   temazepam  (RESTORIL) 15 MG capsule Take 15 mg by mouth at bedtime as needed for sleep.   No facility-administered encounter medications on file as of 07/21/2018.     Activities of Daily Living In your present state of health, do you have any difficulty performing the following activities: 07/21/2018  Hearing? N  Vision? N  Difficulty concentrating or making decisions? N  Walking or climbing stairs? Y  Comment gets out of breath if too many stairs  Dressing or bathing? N  Doing errands, shopping? N  Preparing Food and eating ? N  Using the Toilet? N  In the past six months, have you accidently leaked urine? N  Do you have problems with loss of bowel control? N  Managing your Medications? N  Managing your Finances? N  Housekeeping or managing your Housekeeping? N  Some recent data might be hidden    Patient Care Team: Glendale Chard, MD as PCP - General (Internal Medicine)    Assessment:   This is a routine wellness examination for Parisha.  Exercise Activities and Dietary recommendations Current Exercise Habits: Home exercise routine, Type of exercise: walking, Time (Minutes): 30, Frequency (Times/Week): 2, Weekly Exercise (Minutes/Week): 60, Intensity: Moderate  Goals     Patient Stated     States already met       Fall Risk Fall Risk  07/21/2018 07/21/2018 06/17/2018 03/21/2018  Falls in the past year? 1 1 0 0  Number falls in past yr: 0 0 - -  Comment tripped - - -  Injury with Fall? 0 0 - -  Risk for fall due to : History of fall(s);Medication side effect - - -  Follow up Education provided;Falls prevention discussed - - -   Is the patient's home free of loose throw rugs in walkways, pet beds, electrical cords, etc?   yes      Grab bars in the  bathroom? yes      Handrails on the stairs?   yes      Adequate lighting?   yes  Timed Get Up and Go performed: n/a  Depression Screen PHQ 2/9 Scores 07/21/2018 06/17/2018 03/21/2018  PHQ - 2 Score 0 1 1     Cognitive Function       6CIT Screen 07/21/2018  What Year? 0 points  What month? 0 points  What time? 0 points  Count back from 20 0 points  Months in reverse 2 points  Repeat phrase 0 points  Total Score 2    Immunization History  Administered Date(s) Administered   Influenza,inj,Quad PF,6+ Mos 12/16/2012, 02/01/2014   Influenza-Unspecified 12/13/2017   Pneumococcal Polysaccharide-23 12/16/2012    Qualifies for Shingles Vaccine? yes  Screening Tests Health Maintenance  Topic Date Due   FOOT EXAM  11[birth year]17/1950   URINE MICROALBUMIN  11/17/1950   TETANUS/TDAP  11/17/1959   PNA vac Low Risk Adult (2 of 2 - PCV13) 12/16/2013   OPHTHALMOLOGY EXAM  07/22/2018   HEMOGLOBIN A1C  09/20/2018   INFLUENZA VACCINE  11/05/2018   DEXA SCAN  Completed    Cancer Screenings: Lung: Low Dose CT Chest recommended if Age 22-80 years, 30 pack-year currently smoking OR have quit w/in 15years. Patient does not qualify. Breast:  Up to date on Mammogram? Yes   Up to date of Bone Density/Dexa? Yes Colorectal: not required   Additional Screenings: : Hepatitis C Screening: n/a     Plan:    States reached her goals.   I have personally reviewed and noted the following in the patients chart:    Medical and social history  Use of alcohol, tobacco or illicit drugs   Current medications and supplements  Functional ability and status  Nutritional status  Physical activity  Advanced directives  List of other physicians  Hospitalizations, surgeries, and ER visits in previous 12 months  Vitals  Screenings to include cognitive, depression, and falls  Referrals and appointments  In addition, I have reviewed and discussed with patient certain preventive protocols, quality metrics, and best practice recommendations. A written personalized care plan for preventive services as well as general preventive health recommendations were provided to patient.     Kellie Simmering,  LPN  [id number]

## 2018-07-21 NOTE — Patient Instructions (Signed)
Lynn Carey , Thank you for taking time to come for your Medicare Wellness Visit. I appreciate your ongoing commitment to your health goals. Please review the following plan we discussed and let me know if I can assist you in the future.   Screening recommendations/referrals: Colonoscopy: not required Mammogram: 07/2017 Bone Density: 12/2014 Recommended yearly ophthalmology/optometry visit for glaucoma screening and checkup Recommended yearly dental visit for hygiene and checkup  Vaccinations: Influenza vaccine: 12/2017 Pneumococcal vaccine: 12/2012 Tdap vaccine: 11/2012 Shingles vaccine: discussed    Advanced directives: Please bring a copy of your POA (Power of Boyd) and/or Living Will to your next appointment.    Conditions/risks identified: Obesity  Next appointment: 11/30/2018 at 9:15  Preventive Care 65 Years and Older, Female Preventive care refers to lifestyle choices and visits with your health care provider that can promote health and wellness. What does preventive care include?  A yearly physical exam. This is also called an annual well check.  Dental exams once or twice a year.  Routine eye exams. Ask your health care provider how often you should have your eyes checked.  Personal lifestyle choices, including:  Daily care of your teeth and gums.  Regular physical activity.  Eating a healthy diet.  Avoiding tobacco and drug use.  Limiting alcohol use.  Practicing safe sex.  Taking low-dose aspirin every day.  Taking vitamin and mineral supplements as recommended by your health care provider. What happens during an annual well check? The services and screenings done by your health care provider during your annual well check will depend on your age, overall health, lifestyle risk factors, and family history of disease. Counseling  Your health care provider may ask you questions about your:  Alcohol use.  Tobacco use.  Drug use.  Emotional well-being.   Home and relationship well-being.  Sexual activity.  Eating habits.  History of falls.  Memory and ability to understand (cognition).  Work and work Statistician.  Reproductive health. Screening  You may have the following tests or measurements:  Height, weight, and BMI.  Blood pressure.  Lipid and cholesterol levels. These may be checked every 5 years, or more frequently if you are over 25 years old.  Skin check.  Lung cancer screening. You may have this screening every year starting at age 31 if you have a 30-pack-year history of smoking and currently smoke or have quit within the past 15 years.  Fecal occult blood test (FOBT) of the stool. You may have this test every year starting at age 40.  Flexible sigmoidoscopy or colonoscopy. You may have a sigmoidoscopy every 5 years or a colonoscopy every 10 years starting at age 15.  Hepatitis C blood test.  Hepatitis B blood test.  Sexually transmitted disease (STD) testing.  Diabetes screening. This is done by checking your blood sugar (glucose) after you have not eaten for a while (fasting). You may have this done every 1-3 years.  Bone density scan. This is done to screen for osteoporosis. You may have this done starting at age 92.  Mammogram. This may be done every 1-2 years. Talk to your health care provider about how often you should have regular mammograms. Talk with your health care provider about your test results, treatment options, and if necessary, the need for more tests. Vaccines  Your health care provider may recommend certain vaccines, such as:  Influenza vaccine. This is recommended every year.  Tetanus, diphtheria, and acellular pertussis (Tdap, Td) vaccine. You may need a Td booster  every 10 years.  Zoster vaccine. You may need this after age 18.  Pneumococcal 13-valent conjugate (PCV13) vaccine. One dose is recommended after age 67.  Pneumococcal polysaccharide (PPSV23) vaccine. One dose is  recommended after age 15. Talk to your health care provider about which screenings and vaccines you need and how often you need them. This information is not intended to replace advice given to you by your health care provider. Make sure you discuss any questions you have with your health care provider. Document Released: 04/19/2015 Document Revised: 12/11/2015 Document Reviewed: 01/22/2015 Elsevier Interactive Patient Education  2017 [place] Prevention in the Home Falls can cause injuries. They can happen to people of all ages. There are many things you can do to make your home safe and to help prevent falls. What can I do on the outside of my home?  Regularly fix the edges of walkways and driveways and fix any cracks.  Remove anything that might make you trip as you walk through a door, such as a raised step or threshold.  Trim any bushes or trees on the path to your home.  Use bright outdoor lighting.  Clear any walking paths of anything that might make someone trip, such as rocks or tools.  Regularly check to see if handrails are loose or broken. Make sure that both sides of any steps have handrails.  Any raised decks and porches should have guardrails on the edges.  Have any leaves, snow, or ice cleared regularly.  Use sand or salt on walking paths during winter.  Clean up any spills in your garage right away. This includes oil or grease spills. What can I do in the bathroom?  Use night lights.  Install grab bars by the toilet and in the tub and shower. Do not use towel bars as grab bars.  Use non-skid mats or decals in the tub or shower.  If you need to sit down in the shower, use a plastic, non-slip stool.  Keep the floor dry. Clean up any water that spills on the floor as soon as it happens.  Remove soap buildup in the tub or shower regularly.  Attach bath mats securely with double-sided non-slip rug tape.  Do not have throw rugs and other things on  the floor that can make you trip. What can I do in the bedroom?  Use night lights.  Make sure that you have a light by your bed that is easy to reach.  Do not use any sheets or blankets that are too big for your bed. They should not hang down onto the floor.  Have a firm chair that has side arms. You can use this for support while you get dressed.  Do not have throw rugs and other things on the floor that can make you trip. What can I do in the kitchen?  Clean up any spills right away.  Avoid walking on wet floors.  Keep items that you use a lot in easy-to-reach places.  If you need to reach something above you, use a strong step stool that has a grab bar.  Keep electrical cords out of the way.  Do not use floor polish or wax that makes floors slippery. If you must use wax, use non-skid floor wax.  Do not have throw rugs and other things on the floor that can make you trip. What can I do with my stairs?  Do not leave any items on the stairs.  Make  sure that there are handrails on both sides of the stairs and use them. Fix handrails that are broken or loose. Make sure that handrails are as long as the stairways.  Check any carpeting to make sure that it is firmly attached to the stairs. Fix any carpet that is loose or worn.  Avoid having throw rugs at the top or bottom of the stairs. If you do have throw rugs, attach them to the floor with carpet tape.  Make sure that you have a light switch at the top of the stairs and the bottom of the stairs. If you do not have them, ask someone to add them for you. What else can I do to help prevent falls?  Wear shoes that:  Do not have high heels.  Have rubber bottoms.  Are comfortable and fit you well.  Are closed at the toe. Do not wear sandals.  If you use a stepladder:  Make sure that it is fully opened. Do not climb a closed stepladder.  Make sure that both sides of the stepladder are locked into place.  Ask someone to  hold it for you, if possible.  Clearly mark and make sure that you can see:  Any grab bars or handrails.  First and last steps.  Where the edge of each step is.  Use tools that help you move around (mobility aids) if they are needed. These include:  Canes.  Walkers.  Scooters.  Crutches.  Turn on the lights when you go into a dark area. Replace any light bulbs as soon as they burn out.  Set up your furniture so you have a clear path. Avoid moving your furniture around.  If any of your floors are uneven, fix them.  If there are any pets around you, be aware of where they are.  Review your medicines with your doctor. Some medicines can make you feel dizzy. This can increase your chance of falling. Ask your doctor what other things that you can do to help prevent falls. This information is not intended to replace advice given to you by your health care provider. Make sure you discuss any questions you have with your health care provider. Document Released: 01/17/2009 Document Revised: 08/29/2015 Document Reviewed: 04/27/2014 Elsevier Interactive Patient Education  2017 [employer].

## 2018-07-21 NOTE — Addendum Note (Signed)
Addended by: Glenna Durand E on: 07/21/2018 01:59 PM   Modules accepted: Orders

## 2018-07-21 NOTE — Progress Notes (Signed)
Subjective:     Patient ID: Lynn Carey , female    DOB: 26-Oct-1940 , 78 y.o.   MRN: [id number]   Chief Complaint  Patient presents with  . Diabetes  . Hypertension    HPI  Diabetes  She presents for her follow-up diabetic visit. She has type 2 diabetes mellitus. Her disease course has been stable. There are no hypoglycemic associated symptoms. Pertinent negatives for diabetes include no blurred vision and no chest pain. There are no hypoglycemic complications. Diabetic complications include heart disease and nephropathy. Risk factors for coronary artery disease include diabetes mellitus, dyslipidemia, hypertension, post-menopausal, sedentary lifestyle and obesity. She is compliant with treatment all of the time. She is following a diabetic diet. She participates in exercise intermittently. Her breakfast blood glucose is taken between 8-9 am. Her breakfast blood glucose range is generally 90-110 mg/dl. An ACE inhibitor/angiotensin II receptor blocker is being taken. She does not see a podiatrist.Eye exam is current.  Hypertension  This is a chronic problem. The current episode started more than 1 year ago. The problem has been rapidly improving since onset. The problem is controlled. Pertinent negatives include no blurred vision, chest pain, palpitations or shortness of breath.     Past Medical History:  Diagnosis Date  . Anginal pain ([place])    h/o, denies today   . Arthritis    hip - both, shot in L shoulder- 2 weeks ago  . Asthma   . Carotid artery occlusion   . CHF (congestive heart failure) ([place])   . Complication of anesthesia   . Coronary artery disease   . Diabetes mellitus without complication ([place])   . GERD (gastroesophageal reflux disease)   . Hypertension    followed by Dr. Einar Gip  . MYOCARDIAL INFARCTION 05/25/2007   Qualifier: History of  By: Milana Obey, Doroteo Bradford    . Myocardial infarction ([place]) 1997  . PONV (postoperative nausea and vomiting)   . Shortness of breath       Family History  Problem Relation Age of Onset  . Allergies Mother   . Asthma Mother   . Heart disease Mother   . Heart attack Mother   . Allergies Son   . Asthma Son   . Heart disease Father   . Heart attack Father   . Breast cancer Daughter      Current Outpatient Medications:  .  ACETAMINOPHEN PO, Take 650 mg by mouth every 6 (six) hours as needed for moderate pain or headache. , Disp: , Rfl:  .  albuterol (PROVENTIL HFA;VENTOLIN HFA) 108 (90 BASE) MCG/ACT inhaler, Inhale 2 puffs into the lungs every 4 (four) hours as needed for wheezing or shortness of breath., Disp: , Rfl:  .  aspirin EC 81 MG tablet, Take 1 tablet (81 mg total) by mouth daily., Disp: 90 tablet, Rfl: 3 .  bisoprolol (ZEBETA) 5 MG tablet, Take 1.5 tablets (7.5 mg total) by mouth daily., Disp: 135 tablet, Rfl: 3 .  budesonide-formoterol (SYMBICORT) 160-4.5 MCG/ACT inhaler, Inhale 2 puffs into the lungs every 4 (four) hours as needed (wheezing)., Disp: , Rfl:  .  cetirizine (ZYRTEC) 10 MG tablet, Take 10 mg by mouth at bedtime., Disp: , Rfl:  .  Cholecalciferol (VITAMIN D) 2000 UNITS CAPS, Take 2,000 Units by mouth daily. , Disp: , Rfl:  .  ezetimibe (ZETIA) 10 MG tablet, Take 10 mg by mouth daily., Disp: , Rfl:  .  isosorbide-hydrALAZINE (BIDIL) 20-37.5 MG per tablet, Take 2 tablets by mouth  3 (three) times daily. , Disp: , Rfl:  .  JANUVIA 100 MG tablet, TAKE 1 TABLET EVERY DAY, Disp: 90 tablet, Rfl: 0 .  loratadine (CLARITIN) 10 MG tablet, Take 10 mg by mouth daily., Disp: , Rfl:  .  montelukast (SINGULAIR) 10 MG tablet, Take 10 mg by mouth at bedtime., Disp: , Rfl:  .  nitroGLYCERIN (NITROSTAT) 0.4 MG SL tablet, Place 0.4 mg under the tongue every 5 (five) minutes as needed for chest pain., Disp: , Rfl:  .  pantoprazole (PROTONIX) 40 MG tablet, Take 1 tablet (40 mg total) by mouth daily., Disp: 90 tablet, Rfl: 1 .  ranolazine (RANEXA) 500 MG 12 hr tablet, Take 1,000 mg by mouth 2 (two) times daily. , Disp: ,  Rfl:  .  rosuvastatin (CRESTOR) 20 MG tablet, TAKE 1 TABLET EVERY DAY, Disp: 90 tablet, Rfl: 0 .  sacubitril-valsartan (ENTRESTO) 49-51 MG, Take 1 tablet by mouth 2 (two) times daily., Disp: , Rfl:  .  spironolactone (ALDACTONE) 50 MG tablet, TAKE 1 TABLET BY MOUTH EVERY DAY, Disp: 30 tablet, Rfl: 3 .  temazepam (RESTORIL) 15 MG capsule, Take 15 mg by mouth at bedtime as needed for sleep., Disp: , Rfl: 1   Allergies  Allergen Reactions  . Lisinopril Cough     Review of Systems  Constitutional: Negative.   Eyes: Negative for blurred vision.  Respiratory: Negative.  Negative for shortness of breath.   Cardiovascular: Negative.  Negative for chest pain and palpitations.  Gastrointestinal: Negative.   Neurological: Negative.   Psychiatric/Behavioral: Negative.      Today's Vitals   07/21/18 0839  BP: 136/84  Pulse: 67  Temp: 97.6 F (36.4 C)  TempSrc: Oral  SpO2: 94%  Weight: 140 lb (63.5 kg)  Height: 4\' 9"  (1.448 m)   Body mass index is 30.3 kg/m.   Objective:  Physical Exam Vitals signs and nursing note reviewed.  Constitutional:      Appearance: Normal appearance.  HENT:     Head: Normocephalic and atraumatic.  Cardiovascular:     Rate and Rhythm: Normal rate and regular rhythm.     Heart sounds: Normal heart sounds.  Pulmonary:     Effort: Pulmonary effort is normal.     Breath sounds: Normal breath sounds.  Skin:    General: Skin is warm.  Neurological:     General: No focal deficit present.     Mental Status: She is alert.  Psychiatric:        Mood and Affect: Mood normal.        Behavior: Behavior normal.         Assessment And Plan:     1. Type 2 diabetes mellitus with stage 2 chronic kidney disease, without long-term current use of insulin ([place])  I reviewed her last a1c, it was 5.4. Patient has been extremely diligent in changing her dietary habits and she was congratulated for this. She is advised that she can be a bit more liberal in her eating.  She will continue with Januvia. I will check CMP and lipid panel.   2. Benign hypertensive heart and renal disease  Fair control. She is aware optimal bp is less than 130/80. She is encouraged to limit her salt intake.   3. Atherosclerosis of native coronary artery of native heart with angina pectoris (H[facility])  Chronic, yet stable. She is also followed by cardiology.   4. Seasonal allergic rhinitis due to pollen  Chronic. She will continue with current meds.  5. Class 1 obesity due to excess calories with serious comorbidity and body mass index (BMI) of 30.0 to 30.9 in adult  Importance of achieving optimal weight to decrease risk of cardiovascular disease and cancers was discussed with the patient in full detail. She is encouraged to start slowly - start with 10 minutes twice daily at least three to four days per week and to gradually build to 30 minutes five days weekly. She was given tips to incorporate more activity into her daily routine - take stairs when possible, park farther away from her job, grocery stores, etc.      Maximino Greenland, MD    THE PATIENT IS ENCOURAGED TO PRACTICE SOCIAL DISTANCING DUE TO THE COVID-19 PANDEMIC.

## 2018-07-21 NOTE — Patient Instructions (Signed)
Allergies, Adult  An allergy means that your body reacts to something that bothers it (allergen). It is not a normal reaction. This can happen from something that you:   Eat.   Breathe in.   Touch.  You can have an allergy (be allergic) to:   Outdoor things, like:  ? Pollen.  ? Grass.  ? Weeds.   Indoor things, like:  ? Dust.  ? Smoke.  ? Pet dander.   Foods.   Medicines.   Things that bother your skin, like:  ? Detergents.  ? Chemicals.  ? Latex.   Perfume.   Bugs.  An allergy cannot spread from person to person (is not contagious).  Follow these instructions at home:               Stay away from things that you know you are allergic to.   If you have allergies to things in the air, wash out your nose each day. Do it with one of these:  ? A salt-water (saline) spray.  ? A container (neti pot).   Take over-the-counter and prescription medicines only as told by your doctor.   Keep all follow-up visits as told by your doctor. This is important.   If you are at risk for a very bad allergy reaction (anaphylaxis), keep an auto-injector with you all the time. This is called an epinephrine injection.  ? This is pre-measured medicine with a needle. You can put it into your skin by yourself.  ? Right after you have a very bad allergy reaction, you or a person with you must give the medicine in less than a few minutes. This is an emergency.   If you have ever had a very bad allergy reaction, wear a medical alert bracelet or necklace. Your very bad allergy should be written on it.  Contact a health care provider if:   Your symptoms do not get better with treatment.  Get help right away if:   You have symptoms of a very bad allergy reaction. These include:  ? A swollen mouth, tongue, or throat.  ? Pain or tightness in your chest.  ? Trouble breathing.  ? Being short of breath.  ? Dizziness.  ? Fainting.  ? Very bad pain in your belly (abdomen).  ? Throwing up (vomiting).  ? Watery poop  (diarrhea).  Summary   An allergy means that your body reacts to something that bothers it (allergen). It is not a normal reaction.   Stay away from things that make your body react.   Take over-the-counter and prescription medicines only as told by your doctor.   If you are at risk for a very bad allergy reaction, carry an auto-injector (epinephrine injection) all the time. Also, wear a medical alert bracelet or necklace so people know about your allergy.  This information is not intended to replace advice given to you by your health care provider. Make sure you discuss any questions you have with your health care provider.  Document Released: 07/18/2012 Document Revised: 07/06/2016 Document Reviewed: 07/06/2016  [employer]vier Interactive Patient Education  2019 [employer]

## 2018-07-22 ENCOUNTER — Telehealth: Payer: Self-pay

## 2018-07-22 LAB — CBC
Hematocrit: 36.6 % (ref 34.0–46.6)
Hemoglobin: 12.3 g/dL (ref 11.1–15.9)
MCH: 30.9 pg (ref 26.6–33.0)
MCHC: 33.6 g/dL (ref 31.5–35.7)
MCV: 92 fL (ref 79–97)
Platelets: 313 10*3/uL (ref 150–450)
RBC: 3.98 x10E6/uL (ref 3.77–5.28)
RDW: 12 % (ref 11.7–15.4)
WBC: 5.1 10*3/uL (ref 3.4–10.8)

## 2018-07-22 LAB — CMP14+EGFR
ALT: 10 IU/L (ref 0–32)
AST: 12 IU/L (ref 0–40)
Albumin/Globulin Ratio: 1.4 (ref 1.2–2.2)
Albumin: 4 g/dL (ref 3.7–4.7)
Alkaline Phosphatase: 74 IU/L (ref 39–117)
BUN/Creatinine Ratio: 9 — ABNORMAL LOW (ref 12–28)
BUN: 8 mg/dL (ref 8–27)
Bilirubin Total: 0.5 mg/dL (ref 0.0–1.2)
CO2: 22 mmol/L (ref 20–29)
Calcium: 9.3 mg/dL (ref 8.7–10.3)
Chloride: 104 mmol/L (ref 96–106)
Creatinine, Ser: 0.9 mg/dL (ref 0.57–1.00)
GFR calc Af Amer: 71 mL/min/{1.73_m2} (ref >59)
GFR calc non Af Amer: 62 mL/min/{1.73_m2} (ref >59)
Globulin, Total: 2.9 g/dL (ref 1.5–4.5)
Glucose: 85 mg/dL (ref 65–99)
Potassium: 3.9 mmol/L (ref 3.5–5.2)
Sodium: 142 mmol/L (ref 134–144)
Total Protein: 6.9 g/dL (ref 6.0–8.5)

## 2018-07-22 LAB — HEMOGLOBIN A1C
Est. average glucose Bld gHb Est-mCnc: 111 mg/dL
Hgb A1c MFr Bld: 5.5 % (ref 4.8–5.6)

## 2018-07-22 NOTE — Telephone Encounter (Signed)
I called the pt to notify her of her most recent lab results.  Her voicemail is full and I was unable to leave a message.

## 2018-08-22 ENCOUNTER — Ambulatory Visit: Payer: Medicare HMO | Admitting: Cardiology

## 2018-09-06 ENCOUNTER — Other Ambulatory Visit: Payer: Self-pay | Admitting: Internal Medicine

## 2018-09-06 DIAGNOSIS — Z1231 Encounter for screening mammogram for malignant neoplasm of breast: Secondary | ICD-10-CM

## 2018-09-12 ENCOUNTER — Ambulatory Visit: Payer: Medicare HMO | Admitting: Cardiology

## 2018-09-14 ENCOUNTER — Ambulatory Visit: Payer: Medicare HMO | Admitting: Cardiology

## 2018-09-15 ENCOUNTER — Telehealth: Payer: Self-pay

## 2018-09-15 ENCOUNTER — Other Ambulatory Visit: Payer: Self-pay

## 2018-09-15 ENCOUNTER — Other Ambulatory Visit: Payer: Self-pay | Admitting: Cardiology

## 2018-09-15 MED ORDER — CLOPIDOGREL BISULFATE 75 MG PO TABS: 75.0000 mg | ORAL_TABLET | Freq: Every day | ORAL | 2 refills | Status: DC

## 2018-09-15 NOTE — Telephone Encounter (Signed)
Pt called requesting rf on bisoprolol and clopidogrel but I do not see clopidogrel on her current epic list. Please advise if she is supposed to be on this.//ah

## 2018-09-16 MED ORDER — CLOPIDOGREL BISULFATE 75 MG PO TABS: 75.0000 mg | ORAL_TABLET | Freq: Every day | ORAL | 2 refills | Status: DC

## 2018-09-16 NOTE — Telephone Encounter (Signed)
Pt aware.//ah

## 2018-09-19 ENCOUNTER — Encounter: Payer: Self-pay | Admitting: Cardiology

## 2018-09-19 ENCOUNTER — Ambulatory Visit (INDEPENDENT_AMBULATORY_CARE_PROVIDER_SITE_OTHER): Payer: Medicare HMO | Admitting: Cardiology

## 2018-09-19 ENCOUNTER — Other Ambulatory Visit: Payer: Self-pay

## 2018-09-19 VITALS — BP 133/74 | HR 91 | Ht 60.0 in | Wt 144.0 lb

## 2018-09-19 DIAGNOSIS — I5042 Chronic combined systolic (congestive) and diastolic (congestive) heart failure: Secondary | ICD-10-CM

## 2018-09-19 DIAGNOSIS — I255 Ischemic cardiomyopathy: Secondary | ICD-10-CM

## 2018-09-19 DIAGNOSIS — R05 Cough: Secondary | ICD-10-CM

## 2018-09-19 DIAGNOSIS — I5022 Chronic systolic (congestive) heart failure: Secondary | ICD-10-CM | POA: Insufficient documentation

## 2018-09-19 DIAGNOSIS — I25810 Atherosclerosis of coronary artery bypass graft(s) without angina pectoris: Secondary | ICD-10-CM | POA: Diagnosis not present

## 2018-09-19 DIAGNOSIS — R059 Cough, unspecified: Secondary | ICD-10-CM

## 2018-09-19 HISTORY — DX: Chronic systolic (congestive) heart failure: I50.22

## 2018-09-19 MED ORDER — SACUBITRIL-VALSARTAN 97-103 MG PO TABS: 1.0000 | ORAL_TABLET | Freq: Two times a day (BID) | ORAL | 3 refills | Status: DC
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
## 2018-09-27 ENCOUNTER — Encounter: Payer: Self-pay | Admitting: Internal Medicine

## 2018-09-27 ENCOUNTER — Other Ambulatory Visit: Payer: Self-pay

## 2018-09-27 ENCOUNTER — Ambulatory Visit (INDEPENDENT_AMBULATORY_CARE_PROVIDER_SITE_OTHER): Payer: Medicare HMO | Admitting: Internal Medicine

## 2018-09-27 VITALS — BP 118/76 | HR 77 | Temp 98.3°F | Ht 60.0 in | Wt 144.0 lb

## 2018-09-27 DIAGNOSIS — W19XXXA Unspecified fall, initial encounter: Secondary | ICD-10-CM

## 2018-09-27 DIAGNOSIS — M25512 Pain in left shoulder: Secondary | ICD-10-CM

## 2018-09-27 DIAGNOSIS — E1122 Type 2 diabetes mellitus with diabetic chronic kidney disease: Secondary | ICD-10-CM

## 2018-09-27 DIAGNOSIS — N182 Chronic kidney disease, stage 2 (mild): Secondary | ICD-10-CM | POA: Diagnosis not present

## 2018-09-27 MED ORDER — EZETIMIBE 10 MG PO TABS: 10.0000 mg | ORAL_TABLET | Freq: Every day | ORAL | 2 refills | Status: DC

## 2018-09-27 MED ORDER — ROSUVASTATIN CALCIUM 20 MG PO TABS: 20.0000 mg | ORAL_TABLET | Freq: Every day | ORAL | 2 refills | Status: DC

## 2018-09-27 MED ORDER — PANTOPRAZOLE SODIUM 40 MG PO TBEC: 40.0000 mg | DELAYED_RELEASE_TABLET | Freq: Every day | ORAL | 1 refills | Status: DC

## 2018-09-27 NOTE — Patient Instructions (Signed)
Understanding Your Risk for Falls Each year, millions of people suffer serious injuries from falls. It is important to understand your risk for falling. Talk with your health care provider about your risk and what you can do to lower it. There are actions you can take at home to lower your risk. If you do have a serious fall, it is important to tell your health care provider. Falling once raises your risk for falling again. How can falls affect me? Serious injuries from falls are common. These include:  Broken bones. Most hip fractures are caused by falls.  Traumatic brain injury (TBI). Falls are the most common cause of TBI. Fear of falling can also cause you to avoid activities and stay at home. This can make your muscles weaker and actually raise your risk for a fall. What can increase my risk? Serious injuries from a fall most often happen to people older than age 65. Children and young adults ages 15-29 are also at higher risk. The more risk factors you have for falling, the higher your risk. Risk factors include:  Weakness in the lower body.  Lack (deficiency) of vitamin D.  Weak bones (osteoporosis).  Being generally weak or confused due to long-term (chronic) illness.  Dizziness or balance problems.  Poor vision.  Having depression.  Medicine that causes dizziness or drowsiness. These can include medicines for your blood pressure, heart, anxiety, insomnia, or edema, as well as pain medicines and muscle relaxants.  Drinking alcohol.  Foot pain or improper footwear.  Working at a dangerous job.  Having had a fall in the past.  Tripping hazards at home, such as floor clutter or loose rugs, or poor lighting.  Having pets or clutter in your home. What actions can I take to lower my risk of falling?      Maintain physical fitness: ? Do strength and balance exercises. Consider taking a regular class to build strength and balance. Yoga and tai chi are good options. ?  Have your eyes checked every year and your vision prescription updated as needed.  Remove all clutter from walkways and stairways, including extension cords.  Use a cordless phone.  Do not use throw rugs. Make sure all carpeting is taped or tacked down securely.  Use good lighting in all rooms. Keep a flashlight near your bed.  Make sure there is a clear path from your bed to the bathroom. Use night-lights.  Install grab bars for your tub, shower, and toilet. Use a bath mat in your tub or shower.  Attach secure railings on both sides of your stairs.  Repair uneven or broken steps.  Use a cane or walker as directed by your health care provider.  Wear nonskid shoes. Do not wear high heels. Do not walk around the house in socks or slippers.  Avoid walking on icy or slippery surfaces. Walk on the grass instead of on icy or slick sidewalks. Where you can, use ice melt to get rid of ice on walkways. Questions to ask your health care provider  Can you help me evaluate my risk for a fall?  Do any of my medicines make me more likely to fall?  Should I take a vitamin D supplement?  What exercises can I do to improve my strength and balance?  Should I make an appointment to have my vision checked?  Do I need a bone density test to check for osteoporosis?  Would it help to use a cane or a walker? Where   What exercises can I do to improve my strength and balance?   Should I make an appointment to have my vision checked?   Do I need a bone density test to check for osteoporosis?   Would it help to use a cane or a walker?  Where to find more information   Centers for Disease Control and Prevention, STEADI: cdc.gov   Community-Based Fall Prevention Programs: cdc.gov   National Institute on Aging: go4life.nia.nih.gov  Contact a health care provider if:   You fall at home.   You are afraid of falling at home.   You feel weak, drowsy, or dizzy at home.  Summary   People 65 and older are at high risk for falling. However, older people are not the only ones injured in falls. Children and young adults have a higher-than-normal risk, too.   Talk with your health care provider about your risks for falling  and how to lower those risks.   Taking certain precautions at home can lower your risk for falling.   If you fall, always tell your health care provider.  This information is not intended to replace advice given to you by your health care provider. Make sure you discuss any questions you have with your health care provider.  Document Released: 11/04/2016 Document Revised: 11/04/2016 Document Reviewed: 11/04/2016  [employer]vier Interactive Patient Education  2019 [employer]

## 2018-10-03 DIAGNOSIS — I5042 Chronic combined systolic (congestive) and diastolic (congestive) heart failure: Secondary | ICD-10-CM | POA: Diagnosis not present

## 2018-10-03 DIAGNOSIS — I25810 Atherosclerosis of coronary artery bypass graft(s) without angina pectoris: Secondary | ICD-10-CM | POA: Diagnosis not present

## 2018-10-03 DIAGNOSIS — I255 Ischemic cardiomyopathy: Secondary | ICD-10-CM | POA: Diagnosis not present

## 2018-10-03 NOTE — Progress Notes (Signed)
Subjective:     Patient ID: Lynn Carey , female    DOB: January 07, 1941 , 78 y.o.   MRN: [id number]   Chief Complaint  Patient presents with  . Fall    HPI  Fall The accident occurred 3 to 5 days ago. The fall occurred while walking. She landed on concrete. There was no blood loss. The point of impact was the head, left shoulder and left knee. The pain is present in the left shoulder. The pain is at a severity of 5/10. The symptoms are aggravated by movement. Pertinent negatives include no loss of consciousness, numbness, tingling or visual change. She has tried acetaminophen for the symptoms. The treatment provided mild relief.     Past Medical History:  Diagnosis Date  . Anginal pain ([place])    h/o, denies today   . Arthritis    hip - both, shot in L shoulder- 2 weeks ago  . Asthma   . Carotid artery occlusion   . CHF (congestive heart failure) ([place])   . Complication of anesthesia   . Coronary artery disease   . Diabetes mellitus without complication ([place])   . GERD (gastroesophageal reflux disease)   . Hypertension    followed by Dr. Einar Gip  . MYOCARDIAL INFARCTION 05/25/2007   Qualifier: History of  By: Milana Obey, Doroteo Bradford    . Myocardial infarction ([place]) 1997  . PONV (postoperative nausea and vomiting)   . Shortness of breath      Family History  Problem Relation Age of Onset  . Allergies Mother   . Asthma Mother   . Heart disease Mother   . Heart attack Mother   . Allergies Son   . Asthma Son   . Heart disease Father   . Heart attack Father   . Breast cancer Daughter      Current Outpatient Medications:  .  ACETAMINOPHEN PO, Take 650 mg by mouth every 6 (six) hours as needed for moderate pain or headache. , Disp: , Rfl:  .  albuterol (PROVENTIL HFA;VENTOLIN HFA) 108 (90 BASE) MCG/ACT inhaler, Inhale 2 puffs into the lungs every 4 (four) hours as needed for wheezing or shortness of breath., Disp: , Rfl:  .  aspirin EC 81 MG tablet, Take 1 tablet (81 mg total) by  mouth daily., Disp: 90 tablet, Rfl: 3 .  bisoprolol (ZEBETA) 5 MG tablet, Take 1.5 tablets (7.5 mg total) by mouth daily., Disp: 135 tablet, Rfl: 3 .  budesonide-formoterol (SYMBICORT) 160-4.5 MCG/ACT inhaler, Inhale 2 puffs into the lungs every 4 (four) hours as needed (wheezing)., Disp: , Rfl:  .  cetirizine (ZYRTEC) 10 MG tablet, Take 10 mg by mouth at bedtime., Disp: , Rfl:  .  Cholecalciferol (VITAMIN D) 2000 UNITS CAPS, Take 2,000 Units by mouth daily. , Disp: , Rfl:  .  clopidogrel (PLAVIX) 75 MG tablet, Take 1 tablet (75 mg total) by mouth daily., Disp: 90 tablet, Rfl: 2 .  isosorbide-hydrALAZINE (BIDIL) 20-37.5 MG per tablet, Take 2 tablets by mouth 3 (three) times daily. 1 TAB 2 TIMES PER DAY, Disp: , Rfl:  .  JANUVIA 100 MG tablet, TAKE 1 TABLET EVERY DAY, Disp: 90 tablet, Rfl: 0 .  loratadine (CLARITIN) 10 MG tablet, Take 10 mg by mouth daily., Disp: , Rfl:  .  montelukast (SINGULAIR) 10 MG tablet, Take 10 mg by mouth at bedtime., Disp: , Rfl:  .  nitroGLYCERIN (NITROSTAT) 0.4 MG SL tablet, Place 0.4 mg under the tongue every 5 (five) minutes  as needed for chest pain., Disp: , Rfl:  .  ranolazine (RANEXA) 500 MG 12 hr tablet, Take 1,000 mg by mouth 2 (two) times daily. , Disp: , Rfl:  .  sacubitril-valsartan (ENTRESTO) 97-103 MG, Take 1 tablet by mouth 2 (two) times daily. (Patient taking differently: Take 1 tablet by mouth 2 (two) times daily. 2 TABS 2 TIMES PER DAY), Disp: 120 tablet, Rfl: 3 .  spironolactone (ALDACTONE) 50 MG tablet, TAKE 1 TABLET BY MOUTH EVERY DAY, Disp: 30 tablet, Rfl: 3 .  temazepam (RESTORIL) 15 MG capsule, Take 15 mg by mouth at bedtime as needed for sleep., Disp: , Rfl: 1 .  ezetimibe (ZETIA) 10 MG tablet, Take 1 tablet (10 mg total) by mouth daily., Disp: 90 tablet, Rfl: 2 .  pantoprazole (PROTONIX) 40 MG tablet, Take 1 tablet (40 mg total) by mouth daily., Disp: 90 tablet, Rfl: 1 .  rosuvastatin (CRESTOR) 20 MG tablet, Take 1 tablet (20 mg total) by mouth  daily., Disp: 90 tablet, Rfl: 2   Allergies  Allergen Reactions  . Lisinopril Cough     Review of Systems  Constitutional: Negative.   Respiratory: Negative.   Cardiovascular: Negative.   Gastrointestinal: Negative.   Musculoskeletal: Positive for arthralgias.       She c/o shoulder pain. Reports she [place] this [place] the beach. Specifically, she was at [facility]. She was celebrating her dgtrs 43th wedding anniversary.   She was in parking lot, walking around cars, stepped into a large hole and fell to the ground. She hit head on ground, fell onto her shoulder and scraped her knee. She did not seek medical attention  Neurological: Negative.  Negative for tingling, loss of consciousness and numbness.  Psychiatric/Behavioral: Negative.      Today's Vitals   09/27/18 1157  BP: 118/76  Pulse: 77  Temp: 98.3 F (36.8 C)  TempSrc: Oral  Weight: 144 lb (65.3 kg)  Height: 5' (1.524 m)  PainSc: 0-No pain   Body mass index is 28.12 kg/m.   Objective:  Physical Exam Vitals signs and nursing note reviewed.  Constitutional:      Appearance: Normal appearance.  HENT:     Head: Normocephalic and atraumatic.  Cardiovascular:     Rate and Rhythm: Normal rate and regular rhythm.     Pulses:          Dorsalis pedis pulses are 2+ on the right side and 2+ on the left side.     Heart sounds: Normal heart sounds.  Pulmonary:     Effort: Pulmonary effort is normal.     Breath sounds: Normal breath sounds.  Musculoskeletal:     Left shoulder: She exhibits tenderness.     Right foot: Bunion present.     Left foot: Bunion present.  Feet:     Right foot:     Protective Sensation: 5 sites tested. 5 sites sensed.     Skin integrity: Skin integrity normal.     Toenail Condition: Right toenails are normal.     Left foot:     Protective Sensation: 5 sites tested. 5 sites sensed.     Skin integrity: Skin integrity normal.     Toenail Condition: Left toenails are normal.  Skin:     General: Skin is warm.     Comments: Abrasions b/l knees  Neurological:     General: No focal deficit present.     Mental Status: She is alert.  Psychiatric:  Mood and Affect: Mood normal.        Behavior: Behavior normal.         Assessment And Plan:     1. Fall, initial encounter  Occurred 6/20. Unfortunately, she did suffer some injuries. Advised to try to look down when walking in unfamiliar territory.   2. Acute pain of left shoulder  She has full ROM, although there is some discomfort. She is advised to apply muscle rub to affected area twice daily as needed. She will let me know if her sx persist/worsen.   3. Type 2 diabetes mellitus with stage 2 chronic kidney disease, without long-term current use of insulin ([place])  Diabetic foot exam was performed. I DISCUSSED WITH THE PATIENT AT LENGTH REGARDING THE GOALS OF GLYCEMIC CONTROL AND POSSIBLE LONG-TERM COMPLICATIONS.  I  ALSO STRESSED THE IMPORTANCE OF COMPLIANCE WITH HOME GLUCOSE MONITORING, DIETARY RESTRICTIONS INCLUDING AVOIDANCE OF SUGARY DRINKS/PROCESSED FOODS,  ALONG WITH REGULAR EXERCISE.  I  ALSO STRESSED THE IMPORTANCE OF ANNUAL EYE EXAMS, SELF FOOT CARE AND COMPLIANCE WITH OFFICE VISITS.  Maximino [place], MD    THE PATIENT IS ENCOURAGED TO PRACTICE SOCIAL DISTANCING DUE TO THE COVID-19 PANDEMIC.

## 2018-10-04 LAB — BASIC METABOLIC PANEL
BUN/Creatinine Ratio: 14 (ref 12–28)
BUN: 14 mg/dL (ref 8–27)
CO2: 22 mmol/L (ref 20–29)
Calcium: 9.3 mg/dL (ref 8.7–10.3)
Chloride: 106 mmol/L (ref 96–106)
Creatinine, Ser: 0.97 mg/dL (ref 0.57–1.00)
GFR calc Af Amer: 65 mL/min/{1.73_m2} (ref >59)
GFR calc non Af Amer: 57 mL/min/{1.73_m2} — ABNORMAL LOW (ref >59)
Glucose: 96 mg/dL (ref 65–99)
Potassium: 4.4 mmol/L (ref 3.5–5.2)
Sodium: 141 mmol/L (ref 134–144)

## 2018-10-04 LAB — LIPID PANEL
Chol/HDL Ratio: 4.5 ratio — ABNORMAL HIGH (ref 0.0–4.4)
Cholesterol, Total: 216 mg/dL — ABNORMAL HIGH (ref 100–199)
HDL: 48 mg/dL (ref >39)
LDL Calculated: 149 mg/dL — ABNORMAL HIGH (ref 0–99)
Triglycerides: 94 mg/dL (ref 0–149)
VLDL Cholesterol Cal: 19 mg/dL (ref 5–40)

## 2018-10-04 NOTE — Progress Notes (Signed)
Patient is here for follow up visit.  Subjective:   @Patient  ID: Lynn Carey, female    DOB: [birth year], 78 y.o.   MRN: [id number]  Chief complaint: Cardiomyopathy  HPI  78 year old African-American female with coronary artery disease status post CABG 1997, PCI to SVG 06/23/17, ischemic cardiomyopathy EF 20%, hypertension, hyperlipidemia, bilateral stable moderate carotid stenoses.   At last visit, I had increase Entresto to 2 tab of 49-51 mg bid. Follow up BMP shows normal Cr and K. Lipid panel shows LDL elevated at 149.   It appears that she has actually run out of her eliquis, as well as bisoprolol. Blood pressure and heart rate elevated today. She also has leg swelling.   Past Medical History:  Diagnosis Date  . Anginal pain ([place])    h/o, denies today   . Arthritis    hip - both, shot in L shoulder- 2 weeks ago  . Asthma   . Carotid artery occlusion   . CHF (congestive heart failure) ([place])   . Complication of anesthesia   . Coronary artery disease   . Diabetes mellitus without complication ([place])   . GERD (gastroesophageal reflux disease)   . Hypertension    followed by Dr. Einar Gip  . MYOCARDIAL INFARCTION 05/25/2007   Qualifier: History of  By: Milana Obey, Doroteo Bradford    . Myocardial infarction ([place]) 1997  . PONV (postoperative nausea and vomiting)   . Shortness of breath     Past Surgical History:  Procedure Laterality Date  . CARDIAC CATHETERIZATION  05/2012   see note  . CORONARY ARTERY BYPASS GRAFT  [address]  . CORONARY STENT INTERVENTION Right 06/18/2017   Procedure: CORONARY STENT INTERVENTION;  Surgeon: Adrian Prows, MD;  Location: [place] [facility];  Service: Cardiovascular;  Laterality: Right;  . EYE SURGERY     both eyes, laser procedure  . JOINT REPLACEMENT Left 12/13/12   hip  . LEFT HEART CATH AND CORONARY ANGIOGRAPHY N/A 11/25/2017   Procedure: LEFT HEART CATH AND CORONARY ANGIOGRAPHY;  Surgeon: [name], MD;  Location: [place] [facility];  Service: Cardiovascular;  Laterality: N/A;  . LEFT HEART CATH AND CORS/GRAFTS ANGIOGRAPHY N/A 06/18/2017   Procedure: LEFT HEART CATH AND CORS/GRAFTS ANGIOGRAPHY;  Surgeon: Adrian Prows, MD;  Location: [place] [facility];  Service: Cardiovascular;  Laterality: N/A;  . LEFT HEART CATHETERIZATION WITH CORONARY/GRAFT ANGIOGRAM N/A 04/21/2011   Procedure: LEFT HEART CATHETERIZATION WITH Beatrix Fetters;  Surgeon: Laverda Page, MD;  Location: [facility];  Service: Cardiovascular;  Laterality: N/A;  . PERCUTANEOUS CORONARY INTERVENTION-BALLOON ONLY Right 05/12/2011   Procedure: PERCUTANEOUS CORONARY INTERVENTION-BALLOON ONLY;  Surgeon: Laverda Page, MD;  Location: [facility];  Service: Cardiovascular;  Laterality: Right;  . TOTAL HIP ARTHROPLASTY Left 12/13/2012   Procedure: TOTAL HIP ARTHROPLASTY ANTERIOR APPROACH;  Surgeon: Hessie Dibble, MD;  Location: [place] [facility];  Service: Orthopedics;  Laterality: Left;  . TOTAL HIP ARTHROPLASTY Right 01/30/2014   Procedure: TOTAL HIP ARTHROPLASTY ANTERIOR APPROACH;  Surgeon: Hessie Dibble, MD;  Location: [place];  Service: Orthopedics;  Laterality: Right;  . TUBAL LIGATION      Social History   Socioeconomic History  . Marital status: Widowed    Spouse name: Not on file  . Number of children: Not on file  . Years of education: Not on file  . Highest education level: Not on file  Occupational History  . Not on file  Social  Needs  . Financial resource strain: Not hard at all  . Food insecurity    Worry: Never true    Inability: Never true  . Transportation needs    Medical: No    Non-medical: No  Tobacco Use  . Smoking status: Former Smoker    Packs/day: 0.50    Years: 2.00    Pack years: 1.00    Types: Cigarettes    Quit date: 12/08/1983    Years since quitting: 34.8  . Smokeless tobacco: Never Used  Substance and Sexual Activity  . Alcohol use: No  . Drug use: No  . Sexual activity: Not Currently  Lifestyle  .  Physical activity    Days per week: 2 days    Minutes per session: 30 min  . Stress: Not at all  Relationships  . Social Herbalist on phone: Not on file    Gets together: Not on file    Attends religious service: Not on file    Active member of club or organization: Not on file    Attends meetings of clubs or organizations: Not on file    Relationship status: Not on file  . Intimate partner violence    Fear of current or ex partner: No    Emotionally abused: No    Physically abused: No    Forced sexual activity: No  Other Topics Concern  . Not on file  Social History Narrative  . Not on file    Current Outpatient Medications on File Prior to Visit  Medication Sig Dispense Refill  . ACETAMINOPHEN PO Take 650 mg by mouth every 6 (six) hours as needed for moderate pain or headache.     . albuterol (PROVENTIL HFA;VENTOLIN HFA) 108 (90 BASE) MCG/ACT inhaler Inhale 2 puffs into the lungs every 4 (four) hours as needed for wheezing or shortness of breath.    Marland Kitchen aspirin EC 81 MG tablet Take 1 tablet (81 mg total) by mouth daily. 90 tablet 3  . bisoprolol (ZEBETA) 5 MG tablet Take 1.5 tablets (7.5 mg total) by mouth daily. 135 tablet 3  . budesonide-formoterol (SYMBICORT) 160-4.5 MCG/ACT inhaler Inhale 2 puffs into the lungs every 4 (four) hours as needed (wheezing).    . cetirizine (ZYRTEC) 10 MG tablet Take 10 mg by mouth at bedtime.    . Cholecalciferol (VITAMIN D) 2000 UNITS CAPS Take 2,000 Units by mouth daily.     . clopidogrel (PLAVIX) 75 MG tablet Take 1 tablet (75 mg total) by mouth daily. 90 tablet 2  . ezetimibe (ZETIA) 10 MG tablet Take 1 tablet (10 mg total) by mouth daily. 90 tablet 2  . isosorbide-hydrALAZINE (BIDIL) 20-37.5 MG per tablet Take 2 tablets by mouth 3 (three) times daily. 1 TAB 2 TIMES PER DAY    . JANUVIA 100 MG tablet TAKE 1 TABLET EVERY DAY 90 tablet 0  . loratadine (CLARITIN) 10 MG tablet Take 10 mg by mouth daily.    . montelukast (SINGULAIR) 10  MG tablet Take 10 mg by mouth at bedtime.    . nitroGLYCERIN (NITROSTAT) 0.4 MG SL tablet Place 0.4 mg under the tongue every 5 (five) minutes as needed for chest pain.    . pantoprazole (PROTONIX) 40 MG tablet Take 1 tablet (40 mg total) by mouth daily. 90 tablet 1  . ranolazine (RANEXA) 500 MG 12 hr tablet Take 1,000 mg by mouth 2 (two) times daily.     . rosuvastatin (CRESTOR) 20 MG tablet Take  1 tablet (20 mg total) by mouth daily. 90 tablet 2  . sacubitril-valsartan (ENTRESTO) 97-103 MG Take 1 tablet by mouth 2 (two) times daily. (Patient taking differently: Take 1 tablet by mouth 2 (two) times daily. 2 TABS 2 TIMES PER DAY) 120 tablet 3  . spironolactone (ALDACTONE) 50 MG tablet TAKE 1 TABLET BY MOUTH EVERY DAY 30 tablet 3  . temazepam (RESTORIL) 15 MG capsule Take 15 mg by mouth at bedtime as needed for sleep.  1   No current facility-administered medications on file prior to visit.     Cardiovascular studies:  Carotid artery duplex  07-21-2018 Severe stenosis in the right internal carotid artery (50-69%). Severe stenosis in the left internal carotid artery (50-69%). upper limit of spectrum. The left PSV internal/common carotid artery ratio of 5.94  is consistent with a stenosis of >70%. Antegrade right vertebral artery flow. Left vertebral artery flow is not visualized. Follow up in six months is appropriate if clinically indicated. Compared to the study done on  12/22/17, no significant change.  EKG 05/23/2018: Sinus rhythm. Incomplete LBBB. Baseline artifact seen.   Echocardiogram 05/10/2018: Left ventricle cavity is normal in size. Severe decrease in global wall motion. Doppler evidence of grade II (pseudonormal) diastolic dysfunction, elevated LAP. LVEF 15-20%. Left atrial cavity is moderately dilated. Moderate (Grade II) mitral regurgitation. Mildly restricted mitral valve leaflets. Moderate tricuspid regurgitation. Estimated pulmonary artery systolic pressure 35  mmHg. Mild  pulmonic regurgitation. No significant change compared to previous study on 06/02/2017.  Cath 11/25/2017: LM: 95% diffuse disease (Unchanged from prior cath in 06/2017) LAD: 100% ostially occluded (Unchanged from prior cath in 06/2017) LCx: 100% ostially occluded (Unchanged from prior cath in 06/2017) RCA: 100% known proximal occlusion (Not visualized today) LIMA-LAD: Patent with good distal flow SVG-RCA: Patent with atent ostial stent. No other significant stenosis. Distal RCA has moderate disease (Unchanged from prior cath in 06/2017). RCA to LCx collaterals seen   LVEDP 16-20 mmHg  Recommendation: While the patient may conceivably have some area of unprotected myocardium supplied by native circumflex, most of it seems collaterals from the distal RCA. Left main is nearly occluded, but has a robust LIMA to LAD graft which is patent. Essentially, there is no significant change in angiographic findings compared to post PCI images in 06/2017.  I suspect patient's pain may be due to small vessel disease or perhaps related to pericarditis, given her recent viral infection. I will obtain an echocardiogram before discharge. Recommend current antianginal and medical therapy for CAD, including increased dose of Ranexa to 1000 mg twice a day.  CT angiogram neck 02/01/2018: 80% stenosis and left internal carotid artery with dense calcification 60% stenosis and right internal carotid artery with calcified and noncalcified plaque Scattered atherosclerotic calcifications involving vertebral arteries bilaterally without other significant stenoses  Incidental finding of grade 1 anterolisthesis C2-C3, C3-C4. 4 mm thyroid nodule, and coronary artery disease  Labs: Results for MARGARIE, MCGUIRT (MRN [id number]) as of 10/04/2018 16:10  Ref. Range 10/03/2018 40:98  BASIC METABOLIC PANEL Unknown Rpt (A)  Sodium Latest Ref Range: 134 - 144 mmol/L 141  Potassium Latest Ref Range: 3.5 - 5.2 mmol/L 4.4  Chloride  Latest Ref Range: 96 - 106 mmol/L 106  CO2 Latest Ref Range: 20 - 29 mmol/L 22  Glucose Latest Ref Range: 65 - 99 mg/dL 96  BUN Latest Ref Range: 8 - 27 mg/dL 14  Creatinine Latest Ref Range: 0.57 - 1.00 mg/dL 0.97  Calcium Latest Ref Range: 8.7 -  10.3 mg/dL 9.3  BUN/Creatinine Ratio Latest Ref Range: 12 - 28  14  GFR, Est Non African American Latest Ref Range: >59 mL/min/1.73 57 (L)  GFR, Est African American Latest Ref Range: >59 mL/min/1.73 65  Total CHOL/HDL Ratio Latest Ref Range: 0.0 - 4.4 ratio 4.5 (H)  Cholesterol, Total Latest Ref Range: 100 - 199 mg/dL 216 (H)  HDL Cholesterol Latest Ref Range: >39 mg/dL 48  LDL (calc) Latest Ref Range: 0 - 99 mg/dL 149 (H)  Triglycerides Latest Ref Range: 0 - 149 mg/dL 94  VLDL Cholesterol Cal Latest Ref Range: 5 - 40 mg/dL 19     Review of Systems  Constitution: Positive for malaise/fatigue. Negative for decreased appetite, weight gain and weight loss.  HENT: Negative for congestion.   Eyes: Negative for visual disturbance.  Cardiovascular: Positive for dyspnea on exertion and leg swelling. Negative for palpitations and syncope.  Respiratory: Positive for shortness of breath.   Endocrine: Negative for cold intolerance.  Hematologic/Lymphatic: Does not bruise/bleed easily.  Skin: Negative for itching and rash.  Musculoskeletal: Negative for myalgias.  Gastrointestinal: Negative for abdominal pain, nausea and vomiting.  Genitourinary: Negative for dysuria.  Neurological: Negative for dizziness and weakness.  Psychiatric/Behavioral: The patient is not nervous/anxious.   All other systems reviewed and are negative.    Vitals:   10/05/18 1128  BP: (!) 163/93  Pulse: 98  Temp: 98.6 F (37 C)  SpO2: 99%     Objective:   Physical Exam  Constitutional: She is oriented to person, place, and time. She appears well-developed and well-nourished. No distress.  HENT:  Head: Normocephalic and atraumatic.  Eyes: Pupils are equal,  round, and reactive to light. Conjunctivae are normal.  Neck: No JVD present.  Cardiovascular: Normal rate, regular rhythm and intact distal pulses.  Pulmonary/Chest: Effort normal and breath sounds normal. She has no wheezes. She has no rales.  Abdominal: Soft. Bowel sounds are normal. There is no rebound.  Musculoskeletal:        General: Edema (1+ b/l) present.  Lymphadenopathy:    She has no cervical adenopathy.  Neurological: She is alert and oriented to person, place, and time. No cranial nerve deficit.  Skin: Skin is warm and dry.  Psychiatric: She has a normal mood and affect.  Nursing note and vitals reviewed.       Assessment & Recommendations:   78 year old African-American female with coronary artery disease status post CABG 1997, PCI to SVG 06/23/17, ischemic cardiomyopathy EF 20%, hypertension, hyperlipidemia, bilateral stable moderate carotid stenoses.    1. Coronary artery disease involving coronary bypass graft of native heart without angina pectoris Stable. No recurrent chest pain. Severe native vessel disease with occluded native arteries and ostial or proximal areas. Patent LIMA-LAD, SVG-RCA grafts with RCA to left circumflex collaterals. Diffuse distal RCA artery disease. Okay to contnue aspirin and plavix given complex CAD and absence of bleeding.   2. Ischemic cardiomyopathy HFrEF NYHA class II-III symptoms.  EF remains 15-20% on guideline directed heart failure therapy.  Resume Entresto to 97-103 mg bid, spironolactone 50 mg daily. Switched bisoprolol 7.5 mg daily to metoprolol succinate 50 mg daily.   3. Carotid arterry stenoses: Seen by vascular surgery Dr. Donzetta Matters. Though to have moderate stable disease. Asymptomatic.  Continue medical management.   4. Hyperlipidemia: Uncontrolled. Increase rosuvastatin to 40 mg daily.   5. Chronic cough:  Suspect reactive airway disease. This has predated starting Entresto, and did not improve even when she was off  Praxair  for 2 weeks.   BMP in one week. F/u in 2 weeks.   [name], MD [facility] Pager: [phone] Office: [phone] If no answer Cell [phone]

## 2018-10-05 ENCOUNTER — Other Ambulatory Visit: Payer: Self-pay

## 2018-10-05 ENCOUNTER — Encounter: Payer: Self-pay | Admitting: Cardiology

## 2018-10-05 ENCOUNTER — Ambulatory Visit (INDEPENDENT_AMBULATORY_CARE_PROVIDER_SITE_OTHER): Payer: Medicare HMO | Admitting: Cardiology

## 2018-10-05 VITALS — BP 163/93 | HR 98 | Temp 98.6°F | Wt 142.0 lb

## 2018-10-05 DIAGNOSIS — I255 Ischemic cardiomyopathy: Secondary | ICD-10-CM

## 2018-10-05 DIAGNOSIS — I5042 Chronic combined systolic (congestive) and diastolic (congestive) heart failure: Secondary | ICD-10-CM

## 2018-10-05 DIAGNOSIS — I25810 Atherosclerosis of coronary artery bypass graft(s) without angina pectoris: Secondary | ICD-10-CM | POA: Diagnosis not present

## 2018-10-05 DIAGNOSIS — R05 Cough: Secondary | ICD-10-CM

## 2018-10-05 DIAGNOSIS — R053 Chronic cough: Secondary | ICD-10-CM

## 2018-10-05 DIAGNOSIS — E782 Mixed hyperlipidemia: Secondary | ICD-10-CM | POA: Diagnosis not present

## 2018-10-05 DIAGNOSIS — I6523 Occlusion and stenosis of bilateral carotid arteries: Secondary | ICD-10-CM | POA: Diagnosis not present

## 2018-10-05 MED ORDER — ROSUVASTATIN CALCIUM 40 MG PO TABS: 40.0000 mg | ORAL_TABLET | Freq: Every day | ORAL | 2 refills | Status: DC

## 2018-10-05 MED ORDER — METOPROLOL SUCCINATE ER 50 MG PO TB24: 50.0000 mg | ORAL_TABLET | Freq: Every day | ORAL | 2 refills | Status: DC

## 2018-10-05 MED ORDER — ROSUVASTATIN CALCIUM 40 MG PO TABS: 20.0000 mg | ORAL_TABLET | Freq: Every day | ORAL | 2 refills | Status: DC

## 2018-10-14 DIAGNOSIS — I255 Ischemic cardiomyopathy: Secondary | ICD-10-CM | POA: Diagnosis not present

## 2018-10-15 LAB — BASIC METABOLIC PANEL
BUN/Creatinine Ratio: 15 (ref 12–28)
BUN: 18 mg/dL (ref 8–27)
CO2: 20 mmol/L (ref 20–29)
Calcium: 9.3 mg/dL (ref 8.7–10.3)
Chloride: 106 mmol/L (ref 96–106)
Creatinine, Ser: 1.19 mg/dL — ABNORMAL HIGH (ref 0.57–1.00)
GFR calc Af Amer: 51 mL/min/{1.73_m2} — ABNORMAL LOW (ref >59)
GFR calc non Af Amer: 44 mL/min/{1.73_m2} — ABNORMAL LOW (ref >59)
Glucose: 96 mg/dL (ref 65–99)
Potassium: 4.2 mmol/L (ref 3.5–5.2)
Sodium: 141 mmol/L (ref 134–144)

## 2018-10-20 ENCOUNTER — Other Ambulatory Visit: Payer: Self-pay

## 2018-10-20 ENCOUNTER — Ambulatory Visit (INDEPENDENT_AMBULATORY_CARE_PROVIDER_SITE_OTHER): Payer: Medicare HMO | Admitting: Cardiology

## 2018-10-20 ENCOUNTER — Encounter: Payer: Self-pay | Admitting: Cardiology

## 2018-10-20 VITALS — BP 147/75 | HR 70 | Ht 59.0 in | Wt 139.6 lb

## 2018-10-20 DIAGNOSIS — I6523 Occlusion and stenosis of bilateral carotid arteries: Secondary | ICD-10-CM

## 2018-10-20 DIAGNOSIS — I25810 Atherosclerosis of coronary artery bypass graft(s) without angina pectoris: Secondary | ICD-10-CM | POA: Diagnosis not present

## 2018-10-20 DIAGNOSIS — I5022 Chronic systolic (congestive) heart failure: Secondary | ICD-10-CM

## 2018-10-20 DIAGNOSIS — I5042 Chronic combined systolic (congestive) and diastolic (congestive) heart failure: Secondary | ICD-10-CM

## 2018-10-20 MED ORDER — FUROSEMIDE 40 MG PO TABS: 40.0000 mg | ORAL_TABLET | Freq: Every day | ORAL | 3 refills | Status: DC
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
## 2018-10-21 ENCOUNTER — Encounter: Payer: Self-pay | Admitting: Cardiology

## 2018-10-25 ENCOUNTER — Ambulatory Visit: Payer: Medicare HMO

## 2018-10-31 DIAGNOSIS — H5203 Hypermetropia, bilateral: Secondary | ICD-10-CM | POA: Diagnosis not present

## 2018-10-31 DIAGNOSIS — H524 Presbyopia: Secondary | ICD-10-CM | POA: Diagnosis not present

## 2018-10-31 DIAGNOSIS — H5213 Myopia, bilateral: Secondary | ICD-10-CM | POA: Diagnosis not present

## 2018-10-31 DIAGNOSIS — H52209 Unspecified astigmatism, unspecified eye: Secondary | ICD-10-CM | POA: Diagnosis not present

## 2018-11-02 ENCOUNTER — Telehealth: Payer: Self-pay

## 2018-11-02 NOTE — Telephone Encounter (Signed)
The pt was told that she needed to complete her section of her insurance paperwork and is she ok with her June 23 rd note being included and the pt said yes.

## 2018-11-03 ENCOUNTER — Ambulatory Visit
Admission: RE | Admit: 2018-11-03 | Discharge: 2018-11-03 | Disposition: A | Discharge status: Home / Self Care | Payer: Medicare HMO | Source: Ambulatory Visit | Attending: Internal Medicine | Admitting: Internal Medicine

## 2018-11-03 ENCOUNTER — Other Ambulatory Visit: Payer: Self-pay

## 2018-11-03 ENCOUNTER — Other Ambulatory Visit (HOSPITAL_COMMUNITY): Payer: Self-pay | Admitting: Cardiology

## 2018-11-03 DIAGNOSIS — Z1231 Encounter for screening mammogram for malignant neoplasm of breast: Secondary | ICD-10-CM | POA: Diagnosis not present

## 2018-11-03 DIAGNOSIS — I5022 Chronic systolic (congestive) heart failure: Secondary | ICD-10-CM | POA: Diagnosis not present

## 2018-11-03 DIAGNOSIS — E782 Mixed hyperlipidemia: Secondary | ICD-10-CM | POA: Diagnosis not present

## 2018-11-04 LAB — LIPID PANEL
Chol/HDL Ratio: 2.9 ratio (ref 0.0–4.4)
Cholesterol, Total: 149 mg/dL (ref 100–199)
HDL: 51 mg/dL (ref >39)
LDL Calculated: 78 mg/dL (ref 0–99)
Triglycerides: 99 mg/dL (ref 0–149)
VLDL Cholesterol Cal: 20 mg/dL (ref 5–40)

## 2018-11-04 LAB — BASIC METABOLIC PANEL
BUN/Creatinine Ratio: 14 (ref 12–28)
BUN: 19 mg/dL (ref 8–27)
CO2: 23 mmol/L (ref 20–29)
Calcium: 10 mg/dL (ref 8.7–10.3)
Chloride: 100 mmol/L (ref 96–106)
Creatinine, Ser: 1.36 mg/dL — ABNORMAL HIGH (ref 0.57–1.00)
GFR calc Af Amer: 43 mL/min/{1.73_m2} — ABNORMAL LOW (ref >59)
GFR calc non Af Amer: 38 mL/min/{1.73_m2} — ABNORMAL LOW (ref >59)
Glucose: 85 mg/dL (ref 65–99)
Potassium: 4.6 mmol/L (ref 3.5–5.2)
Sodium: 142 mmol/L (ref 134–144)

## 2018-11-07 ENCOUNTER — Other Ambulatory Visit: Payer: Self-pay

## 2018-11-07 MED ORDER — ALBUTEROL SULFATE HFA 108 (90 BASE) MCG/ACT IN AERS: 2.0000 | INHALATION_SPRAY | RESPIRATORY_TRACT | 3 refills | Status: AC | PRN

## 2018-11-11 ENCOUNTER — Ambulatory Visit: Payer: Medicare HMO | Admitting: Cardiology
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
## 2018-11-15 ENCOUNTER — Other Ambulatory Visit: Payer: Self-pay

## 2018-11-15 DIAGNOSIS — I10 Essential (primary) hypertension: Secondary | ICD-10-CM

## 2018-11-15 MED ORDER — RANOLAZINE ER 500 MG PO TB12: 1000.0000 mg | ORAL_TABLET | Freq: Two times a day (BID) | ORAL | 6 refills | Status: DC

## 2018-11-15 MED ORDER — RANOLAZINE ER 500 MG PO TB12: 1000.0000 mg | ORAL_TABLET | Freq: Two times a day (BID) | ORAL | 6 refills | Status: AC

## 2018-11-17 ENCOUNTER — Telehealth: Payer: Self-pay | Admitting: Internal Medicine

## 2018-11-17 NOTE — Chronic Care Management (AMB) (Signed)
Chronic Care Management   Note  11/17/2018 Name: Lynn Carey MRN: [id number] DOB: 1940/12/26  Lynn Carey is a 78 y.o. year old female who is a primary care patient of Glendale Chard, MD. I reached out to [name] by phone today in response to a referral sent by Ms. Arman Bogus Artz's health plan.    Lynn Carey was given information about Chronic Care Management services today including:  1. CCM service includes personalized support from designated clinical staff supervised by her physician, including individualized plan of care and coordination with other care providers 2. 24/7 contact phone numbers for assistance for urgent and routine care needs. 3. Service will only be billed when office clinical staff spend 20 minutes or more in a month to coordinate care. 4. Only one practitioner may furnish and bill the service in a calendar month. 5. The patient may stop CCM services at any time (effective at the end of the month) by phone call to the office staff. 6. The patient will be responsible for cost sharing (co-pay) of up to 20% of the service fee (after annual deductible is met).  Patient agreed to services and verbal consent obtained.   Follow up plan: Telephone appointment with CCM team member scheduled for: 11/22/2018  [place]  ??[email or web address]   ??[phone]

## 2018-11-22 ENCOUNTER — Ambulatory Visit: Payer: Medicare HMO | Admitting: Pharmacist

## 2018-11-22 NOTE — Progress Notes (Signed)
  Chronic Care Management   Outreach Note  11/22/2018 Name: Lynn Carey MRN: [id number] DOB: 1940-04-19  Referred by: Glendale Chard, MD Reason for referral : Chronic Care Management   An unsuccessful telephone outreach was attempted today. The patient was referred to the case management team by for assistance with chronic care management and care coordination.   Follow Up Plan: The care management team will reach out to the patient again over the next 5-7 business days.  Voicemail box was full and could not accept messages.  Will follow up.  Regina Eck, PharmD, BCPS Clinical Pharmacist, [facility] [place]: [phone]

## 2018-11-29 ENCOUNTER — Telehealth: Payer: Self-pay

## 2018-11-30 ENCOUNTER — Other Ambulatory Visit: Payer: Self-pay

## 2018-11-30 ENCOUNTER — Ambulatory Visit (INDEPENDENT_AMBULATORY_CARE_PROVIDER_SITE_OTHER): Payer: Medicare HMO | Admitting: Internal Medicine

## 2018-11-30 ENCOUNTER — Encounter: Payer: Self-pay | Admitting: Internal Medicine

## 2018-11-30 VITALS — BP 122/76 | HR 97 | Temp 97.9°F | Ht <= 58 in | Wt 135.6 lb

## 2018-11-30 DIAGNOSIS — L299 Pruritus, unspecified: Secondary | ICD-10-CM | POA: Diagnosis not present

## 2018-11-30 DIAGNOSIS — I1310 Hypertensive heart and chronic kidney disease without heart failure, with stage 1 through stage 4 chronic kidney disease, or unspecified chronic kidney disease: Secondary | ICD-10-CM | POA: Diagnosis not present

## 2018-11-30 DIAGNOSIS — R05 Cough: Secondary | ICD-10-CM

## 2018-11-30 DIAGNOSIS — Z23 Encounter for immunization: Secondary | ICD-10-CM

## 2018-11-30 DIAGNOSIS — Z0000 Encounter for general adult medical examination without abnormal findings: Secondary | ICD-10-CM | POA: Diagnosis not present

## 2018-11-30 DIAGNOSIS — L304 Erythema intertrigo: Secondary | ICD-10-CM | POA: Diagnosis not present

## 2018-11-30 DIAGNOSIS — E1122 Type 2 diabetes mellitus with diabetic chronic kidney disease: Secondary | ICD-10-CM

## 2018-11-30 DIAGNOSIS — N182 Chronic kidney disease, stage 2 (mild): Secondary | ICD-10-CM

## 2018-11-30 DIAGNOSIS — R053 Chronic cough: Secondary | ICD-10-CM

## 2018-11-30 DIAGNOSIS — I25119 Atherosclerotic heart disease of native coronary artery with unspecified angina pectoris: Secondary | ICD-10-CM

## 2018-11-30 LAB — POCT URINALYSIS DIPSTICK
Blood, UA: NEGATIVE
Glucose, UA: NEGATIVE
Ketones, UA: NEGATIVE
Nitrite, UA: NEGATIVE
Protein, UA: POSITIVE — AB
Spec Grav, UA: >=1.03 — AB (ref 1.010–1.025)
Urobilinogen, UA: 0.2 E.U./dL
pH, UA: 5 (ref 5.0–8.0)

## 2018-11-30 MED ORDER — NYSTATIN 100000 UNIT/GM EX POWD: CUTANEOUS | 0 refills | Status: DC

## 2018-11-30 MED ORDER — TRAMADOL HCL 50 MG PO TABS: 50.0000 mg | ORAL_TABLET | Freq: Four times a day (QID) | ORAL | 0 refills | Status: AC | PRN

## 2018-11-30 NOTE — Patient Instructions (Signed)
Health Maintenance, Female Adopting a healthy lifestyle and getting preventive care are important in promoting health and wellness. Ask your health care provider about:  The right schedule for you to have regular tests and exams.  Things you can do on your own to prevent diseases and keep yourself healthy. What should I know about diet, weight, and exercise? Eat a healthy diet   Eat a diet that includes plenty of vegetables, fruits, low-fat dairy products, and lean protein.  Do not eat a lot of foods that are high in solid fats, added sugars, or sodium. Maintain a healthy weight Body mass index (BMI) is used to identify weight problems. It estimates body fat based on height and weight. Your health care provider can help determine your BMI and help you achieve or maintain a healthy weight. Get regular exercise Get regular exercise. This is one of the most important things you can do for your health. Most adults should:  Exercise for at least 150 minutes each week. The exercise should increase your heart rate and make you sweat (moderate-intensity exercise).  Do strengthening exercises at least twice a week. This is in addition to the moderate-intensity exercise.  Spend less time sitting. Even light physical activity can be beneficial. Watch cholesterol and blood lipids Have your blood tested for lipids and cholesterol at 78 years of age, then have this test every 5 years. Have your cholesterol levels checked more often if:  Your lipid or cholesterol levels are high.  You are older than 78 years of age.  You are at high risk for heart disease. What should I know about cancer screening? Depending on your health history and family history, you may need to have cancer screening at various ages. This may include screening for:  Breast cancer.  Cervical cancer.  Colorectal cancer.  Skin cancer.  Lung cancer. What should I know about heart disease, diabetes, and high blood  pressure? Blood pressure and heart disease  High blood pressure causes heart disease and increases the risk of stroke. This is more likely to develop in people who have high blood pressure readings, are of African descent, or are overweight.  Have your blood pressure checked: ? Every 3-5 years if you are 18-39 years of age. ? Every year if you are 40 years old or older. Diabetes Have regular diabetes screenings. This checks your fasting blood sugar level. Have the screening done:  Once every three years after age 40 if you are at a normal weight and have a low risk for diabetes.  More often and at a younger age if you are overweight or have a high risk for diabetes. What should I know about preventing infection? Hepatitis B If you have a higher risk for hepatitis B, you should be screened for this virus. Talk with your health care provider to find out if you are at risk for hepatitis B infection. Hepatitis C Testing is recommended for:  Everyone born from 1945 through 1965.  Anyone with known risk factors for hepatitis C. Sexually transmitted infections (STIs)  Get screened for STIs, including gonorrhea and chlamydia, if: ? You are sexually active and are younger than 78 years of age. ? You are older than 78 years of age and your health care provider tells you that you are at risk for this type of infection. ? Your sexual activity has changed since you were last screened, and you are at increased risk for chlamydia or gonorrhea. Ask your health care provider if   you are at risk.  Ask your health care provider about whether you are at high risk for HIV. Your health care provider may recommend a prescription medicine to help prevent HIV infection. If you choose to take medicine to prevent HIV, you should first get tested for HIV. You should then be tested every 3 months for as long as you are taking the medicine. Pregnancy  If you are about to stop having your period (premenopausal) and  you may become pregnant, seek counseling before you get pregnant.  Take 400 to 800 micrograms (mcg) of folic acid every day if you become pregnant.  Ask for birth control (contraception) if you want to prevent pregnancy. Osteoporosis and menopause Osteoporosis is a disease in which the bones lose minerals and strength with aging. This can result in bone fractures. If you are 65 years old or older, or if you are at risk for osteoporosis and fractures, ask your health care provider if you should:  Be screened for bone loss.  Take a calcium or vitamin D supplement to lower your risk of fractures.  Be given hormone replacement therapy (HRT) to treat symptoms of menopause. Follow these instructions at home: Lifestyle  Do not use any products that contain nicotine or tobacco, such as cigarettes, e-cigarettes, and chewing tobacco. If you need help quitting, ask your health care provider.  Do not use street drugs.  Do not share needles.  Ask your health care provider for help if you need support or information about quitting drugs. Alcohol use  Do not drink alcohol if: ? Your health care provider tells you not to drink. ? You are pregnant, may be pregnant, or are planning to become pregnant.  If you drink alcohol: ? Limit how much you use to 0-1 drink a day. ? Limit intake if you are breastfeeding.  Be aware of how much alcohol is in your drink. In the [place] one drink equals one 12 oz bottle of beer (355 mL), one 5 oz glass of wine (148 mL), or one 1 oz glass of hard liquor (44 mL). General instructions  Schedule regular health, dental, and eye exams.  Stay current with your vaccines.  Tell your health care provider if: ? You often feel depressed. ? You have ever been abused or do not feel safe at home. Summary  Adopting a healthy lifestyle and getting preventive care are important in promoting health and wellness.  Follow your health care provider's instructions about healthy  diet, exercising, and getting tested or screened for diseases.  Follow your health care provider's instructions on monitoring your cholesterol and blood pressure. This information is not intended to replace advice given to you by your health care provider. Make sure you discuss any questions you have with your health care provider. Document Released: 10/06/2010 Document Revised: 03/16/2018 Document Reviewed: 03/16/2018 Elsevier Patient Education  2020 [employer]  

## 2018-11-30 NOTE — Progress Notes (Addendum)
Subjective:     Patient ID: Lynn Carey , female    DOB: [birth year] , 78 y.o.   MRN: [id number]   Chief Complaint  Patient presents with  . Annual Exam  . Diabetes  . Hypertension    HPI  She is here today for a full physical examination. She is no longer followed by GYN.   Diabetes She presents for her follow-up diabetic visit. She has type 2 diabetes mellitus. There are no hypoglycemic associated symptoms. Pertinent negatives for diabetes include no blurred vision and no chest pain. There are no hypoglycemic complications. Diabetic complications include heart disease and nephropathy. Risk factors for coronary artery disease include diabetes mellitus, dyslipidemia, hypertension, post-menopausal and sedentary lifestyle. She never participates in exercise. Her breakfast blood glucose is taken between 8-9 am. Her breakfast blood glucose range is generally 90-110 mg/dl. Eye exam is not current.  Hypertension This is a chronic problem. The current episode started more than 1 year ago. The problem has been gradually improving since onset. The problem is controlled. Pertinent negatives include no blurred vision, chest pain, palpitations or shortness of breath. The current treatment provides moderate improvement. Compliance problems include exercise.  Hypertensive end-organ damage includes kidney disease and CAD/MI.     Past Medical History:  Diagnosis Date  . Anginal pain ([place])    h/o, denies today   . Arthritis    hip - both, shot in L shoulder- 2 weeks ago  . Asthma   . Carotid artery occlusion   . CHF (congestive heart failure) ([place])   . Complication of anesthesia   . Coronary artery disease   . Diabetes mellitus without complication ([place])   . GERD (gastroesophageal reflux disease)   . Hypertension    followed by Dr. Einar Gip  . MYOCARDIAL INFARCTION 05/25/2007   Qualifier: History of  By: Milana Obey, Doroteo Bradford    . Myocardial infarction ([place]) 1997  . PONV (postoperative nausea and  vomiting)   . Shortness of breath      Family History  Problem Relation Age of Onset  . Allergies Mother   . Asthma Mother   . Heart disease Mother   . Heart attack Mother   . Allergies Son   . Asthma Son   . Heart disease Father   . Heart attack Father   . Breast cancer Daughter 8     Current Outpatient Medications:  .  ACETAMINOPHEN PO, Take 650 mg by mouth every 6 (six) hours as needed for moderate pain or headache. , Disp: , Rfl:  .  albuterol (VENTOLIN HFA) 108 (90 Base) MCG/ACT inhaler, Inhale 2 puffs into the lungs every 4 (four) hours as needed for wheezing or shortness of breath., Disp: 6.7 g, Rfl: 3 .  aspirin EC 81 MG tablet, Take 1 tablet (81 mg total) by mouth daily., Disp: 90 tablet, Rfl: 3 .  budesonide-formoterol (SYMBICORT) 160-4.5 MCG/ACT inhaler, Inhale 2 puffs into the lungs every 4 (four) hours as needed (wheezing)., Disp: , Rfl:  .  cetirizine (ZYRTEC) 10 MG tablet, Take 10 mg by mouth at bedtime., Disp: , Rfl:  .  Cholecalciferol (VITAMIN D) 2000 UNITS CAPS, Take 2,000 Units by mouth daily. , Disp: , Rfl:  .  clopidogrel (PLAVIX) 75 MG tablet, Take 1 tablet (75 mg total) by mouth daily., Disp: 90 tablet, Rfl: 2 .  dorzolamide-timolol (COSOPT) 22.3-6.8 MG/ML ophthalmic solution, Place 1 drop into both eyes 3 (three) times daily., Disp: , Rfl:  .  ezetimibe (ZETIA)  10 MG tablet, Take 1 tablet (10 mg total) by mouth daily., Disp: 90 tablet, Rfl: 2 .  furosemide (LASIX) 40 MG tablet, Take 1 tablet (40 mg total) by mouth daily., Disp: 90 tablet, Rfl: 3 .  isosorbide-hydrALAZINE (BIDIL) 20-37.5 MG per tablet, Take 2 tablets by mouth 3 (three) times daily. 1 TAB 2 TIMES PER DAY, Disp: , Rfl:  .  JANUVIA 100 MG tablet, TAKE 1 TABLET EVERY DAY, Disp: 90 tablet, Rfl: 0 .  loratadine (CLARITIN) 10 MG tablet, Take 10 mg by mouth daily., Disp: , Rfl:  .  metoprolol succinate (TOPROL-XL) 50 MG 24 hr tablet, Take 1 tablet (50 mg total) by mouth daily. Take with or immediately  following a meal., Disp: 30 tablet, Rfl: 2 .  montelukast (SINGULAIR) 10 MG tablet, Take 10 mg by mouth at bedtime., Disp: , Rfl:  .  nitroGLYCERIN (NITROSTAT) 0.4 MG SL tablet, Place 0.4 mg under the tongue every 5 (five) minutes as needed for chest pain., Disp: , Rfl:  .  pantoprazole (PROTONIX) 40 MG tablet, Take 1 tablet (40 mg total) by mouth daily., Disp: 90 tablet, Rfl: 1 .  ranolazine (RANEXA) 500 MG 12 hr tablet, Take 2 tablets (1,000 mg total) by mouth 2 (two) times daily., Disp: 60 tablet, Rfl: 6 .  rosuvastatin (CRESTOR) 40 MG tablet, Take 1 tablet (40 mg total) by mouth daily. (Patient taking differently: Take 20 mg by mouth daily. ), Disp: 90 tablet, Rfl: 2 .  sacubitril-valsartan (ENTRESTO) 97-103 MG, Take 1 tablet by mouth 2 (two) times daily. (Patient taking differently: Take 1 tablet by mouth daily. ), Disp: 120 tablet, Rfl: 3 .  spironolactone (ALDACTONE) 50 MG tablet, TAKE 1 TABLET BY MOUTH EVERY DAY, Disp: 30 tablet, Rfl: 3 .  temazepam (RESTORIL) 15 MG capsule, Take 15 mg by mouth at bedtime as needed for sleep., Disp: , Rfl: 1 .  nystatin (NYSTATIN) powder, Apply to affected area tid prn, Disp: 60 g, Rfl: 0 .  traMADol (ULTRAM) 50 MG tablet, Take 1 tablet (50 mg total) by mouth every 6 (six) hours as needed., Disp: 20 tablet, Rfl: 0   Allergies  Allergen Reactions  . Lisinopril Cough      The patient states she uses post menopausal status for birth control. Last LMP was No LMP recorded. Patient is postmenopausal.. Negative for Dysmenorrhea Negative for: breast discharge, breast lump(s), breast pain and breast self exam. Associated symptoms include abnormal vaginal bleeding. Pertinent negatives include abnormal bleeding (hematology), anxiety, decreased libido, depression, difficulty falling sleep, dyspareunia, history of infertility, nocturia, sexual dysfunction, sleep disturbances, urinary incontinence, urinary urgency, vaginal discharge and vaginal itching. Diet regular.The  patient states her exercise level is    . The patient's tobacco use is:  Social History   Tobacco Use  Smoking Status Former Smoker  . Packs/day: 0.50  . Years: 2.00  . Pack years: 1.00  . Types: Cigarettes  . Quit date: 12/08/1983  . Years since quitting: 35.0  Smokeless Tobacco Never Used  . She has been exposed to passive smoke. The patient's alcohol use is:  Social History   Substance and Sexual Activity  Alcohol Use No    Review of Systems  Constitutional: Negative.   HENT: Negative.   Eyes: Negative.  Negative for blurred vision.  Respiratory: Negative.  Negative for shortness of breath.   Cardiovascular: Negative.  Negative for chest pain and palpitations.  Endocrine: Negative.   Genitourinary: Negative.   Musculoskeletal: Negative.   Skin: Negative.  Allergic/Immunologic: Negative.   Neurological: Negative.   Hematological: Negative.   Psychiatric/Behavioral: Negative.      Today's Vitals   11/30/18 0932  BP: 122/76  Pulse: 97  Temp: 97.9 F (36.6 C)  TempSrc: Oral  Weight: 135 lb 9.6 oz (61.5 kg)  Height: 4' 9.4" (1.458 m)  PainSc: 0-No pain   Body mass index is 28.94 kg/m.   Objective:  Physical Exam Vitals signs and nursing note reviewed.  Constitutional:      Appearance: Normal appearance.  HENT:     Head: Normocephalic and atraumatic.     Right Ear: Tympanic membrane, ear canal and external ear normal.     Left Ear: Tympanic membrane, ear canal and external ear normal.     Nose: Nose normal.     Mouth/Throat:     Mouth: Mucous membranes are moist.     Pharynx: Oropharynx is clear.  Eyes:     Extraocular Movements: Extraocular movements intact.     Conjunctiva/sclera: Conjunctivae normal.     Pupils: Pupils are equal, round, and reactive to light.  Neck:     Musculoskeletal: Normal range of motion and neck supple.  Cardiovascular:     Rate and Rhythm: Normal rate and regular rhythm.     Pulses: Normal pulses.          Dorsalis pedis  pulses are 2+ on the right side and 2+ on the left side.     Heart sounds: Normal heart sounds.  Pulmonary:     Effort: Pulmonary effort is normal.     Breath sounds: Normal breath sounds.  Chest:     Breasts: Tanner Score is 5.        Right: Normal.        Left: Normal.    Abdominal:     General: Abdomen is flat. Bowel sounds are normal.     Palpations: Abdomen is soft.  Genitourinary:    Comments: deferred Musculoskeletal: Normal range of motion.  Feet:     Right foot:     Protective Sensation: 5 sites tested. 5 sites sensed.     Skin integrity: Skin integrity normal.     Toenail Condition: Right toenails are normal.     Left foot:     Protective Sensation: 5 sites tested. 5 sites sensed.     Skin integrity: Skin integrity normal.     Toenail Condition: Left toenails are normal.  Skin:    General: Skin is warm and dry.     Findings: Rash present. Rash is scaling.     Comments: Area of scaly, hyperpigmented skin beneath breasts and under left axilla  Neurological:     General: No focal deficit present.     Mental Status: She is alert and oriented to person, place, and time.  Psychiatric:        Mood and Affect: Mood normal.        Behavior: Behavior normal.         Assessment And Plan:     1. Routine general medical examination at health care facility  A full exam was performed.  Importance of monthly self breast exams was discussed with the patient.  PATIENT HAS BEEN ADVISED TO GET 30-45 MINUTES REGULAR EXERCISE NO LESS THAN FOUR TO FIVE DAYS PER WEEK - BOTH WEIGHTBEARING EXERCISES AND AEROBIC ARE RECOMMENDED.  SHE WAS ADVISED TO FOLLOW A HEALTHY DIET WITH AT LEAST SIX FRUITS/VEGGIES PER DAY, DECREASE INTAKE OF RED MEAT, AND TO INCREASE FISH INTAKE  TO TWO DAYS PER WEEK.  MEATS/FISH SHOULD NOT BE FRIED, BAKED OR BROILED IS PREFERABLE.  I SUGGEST WEARING SPF 50 SUNSCREEN ON EXPOSED PARTS AND ESPECIALLY WHEN IN THE DIRECT SUNLIGHT FOR AN EXTENDED PERIOD OF TIME.  PLEASE  AVOID FAST FOOD RESTAURANTS AND INCREASE YOUR WATER INTAKE.  - POCT Urinalysis Dipstick (81002)  2. Type 2 diabetes mellitus with stage 2 chronic kidney disease, without long-term current use of insulin ([place])  Diabetic foot exam was performed.  I DISCUSSED WITH THE PATIENT AT LENGTH REGARDING THE GOALS OF GLYCEMIC CONTROL AND POSSIBLE LONG-TERM COMPLICATIONS.  I  ALSO STRESSED THE IMPORTANCE OF COMPLIANCE WITH HOME GLUCOSE MONITORING, DIETARY RESTRICTIONS INCLUDING AVOIDANCE OF SUGARY DRINKS/PROCESSED FOODS,  ALONG WITH REGULAR EXERCISE.  I  ALSO STRESSED THE IMPORTANCE OF ANNUAL EYE EXAMS, SELF FOOT CARE AND COMPLIANCE WITH OFFICE VISITS.  - Hemoglobin A1c  3. Benign hypertensive heart and renal disease  Chronic, well controlled. She will continue with current meds. She is encouraged to avoid adding salt to her foods. EKG performed, no new changes noted. LBBB noted, this is not new.   - EKG 12-Lead  4. Atherosclerosis of native coronary artery of native heart with angina pectoris (HCC)  Chronic, yet currently stable. She is also followed by Cardiology.   5. Pruritus  This has been persistent according to the patient. She is advised to stay well hydrated and be sure to keep skin well moisturized. I will check labs as listed below. I will make further recommendations once her labs are available for review.   - Food Allergy Profile - Liver Profile - CBC with Diff  6. Chronic cough  Unfortunately, she has had this for years. She has had extensive evaluation by both Pulmonary and ENT without any improvement in her symptoms.  Pt advised of off-label use of tramadol for chronic cough. She agrees to give this a try. She has used Hydromet syrup in the past. Review of the [place] CSRS was performed in accordance of the [place] prior to dispensing any controlled drugs.   7. Intertrigo  I will send her rx nystatin powder to apply to affected area as needed. Pt advised she may need nystatin cream in  the future.  She will let me know if she has any improvement within a week or two .  8. Encounter for immunization  She was given high dose flu vaccine to update her immunization history.   - Flu vaccine HIGH DOSE PF (Fluzone High dose)        Maximino Greenland, MD    THE PATIENT IS ENCOURAGED TO PRACTICE SOCIAL DISTANCING DUE TO THE COVID-19 PANDEMIC.

## 2018-12-01 ENCOUNTER — Ambulatory Visit (INDEPENDENT_AMBULATORY_CARE_PROVIDER_SITE_OTHER): Payer: Medicare HMO | Admitting: Cardiology

## 2018-12-01 ENCOUNTER — Encounter: Payer: Self-pay | Admitting: Cardiology

## 2018-12-01 VITALS — BP 140/70 | HR 84 | Temp 98.7°F | Ht 59.0 in | Wt 135.0 lb

## 2018-12-01 DIAGNOSIS — I255 Ischemic cardiomyopathy: Secondary | ICD-10-CM

## 2018-12-01 DIAGNOSIS — E782 Mixed hyperlipidemia: Secondary | ICD-10-CM | POA: Diagnosis not present

## 2018-12-01 DIAGNOSIS — I25810 Atherosclerosis of coronary artery bypass graft(s) without angina pectoris: Secondary | ICD-10-CM

## 2018-12-01 DIAGNOSIS — I5022 Chronic systolic (congestive) heart failure: Secondary | ICD-10-CM | POA: Diagnosis not present

## 2018-12-01 DIAGNOSIS — I6523 Occlusion and stenosis of bilateral carotid arteries: Secondary | ICD-10-CM | POA: Diagnosis not present

## 2018-12-01 NOTE — Progress Notes (Signed)
Patient is here for follow up visit.  Subjective:   _0  ID: Lynn Carey, female    DOB: [birth year], 78 y.o.   MRN: [id number]  Chief complaint: Cardiomyopathy  HPI  78 year old African-American female with coronary artery disease status post CABG 1997, PCI to SVG 06/23/17, ischemic cardiomyopathy EF 20%, hypertension, hyperlipidemia, bilateral stable moderate carotid stenoses.   Patient has had significant improvement in his leg swelling and shortness of breath symptoms.Recent BMP showed increase in Cr to 146, eGFR of 43.   Past Medical History:  Diagnosis Date  . Anginal pain ([place])    h/o, denies today   . Arthritis    hip - both, shot in L shoulder- 2 weeks ago  . Asthma   . Carotid artery occlusion   . CHF (congestive heart failure) ([place])   . Complication of anesthesia   . Coronary artery disease   . Diabetes mellitus without complication ([place])   . GERD (gastroesophageal reflux disease)   . Hypertension    followed by Dr. Einar Gip  . MYOCARDIAL INFARCTION 05/25/2007   Qualifier: History of  By: Milana Obey, Doroteo Bradford    . Myocardial infarction ([place]) 1997  . PONV (postoperative nausea and vomiting)   . Shortness of breath     Past Surgical History:  Procedure Laterality Date  . CARDIAC CATHETERIZATION  05/2012   see note  . CORONARY ARTERY BYPASS GRAFT  [address]  . CORONARY STENT INTERVENTION Right 06/18/2017   Procedure: CORONARY STENT INTERVENTION;  Surgeon: Adrian Prows, MD;  Location: [facility];  Service: Cardiovascular;  Laterality: Right;  . EYE SURGERY     both eyes, laser procedure  . JOINT REPLACEMENT Left 12/13/12   hip  . LEFT HEART CATH AND CORONARY ANGIOGRAPHY N/A 11/25/2017   Procedure: LEFT HEART CATH AND CORONARY ANGIOGRAPHY;  Surgeon: [name], MD;  Location: [facility];  Service: Cardiovascular;  Laterality: N/A;  . LEFT HEART CATH AND CORS/GRAFTS ANGIOGRAPHY N/A 06/18/2017   Procedure: LEFT HEART CATH AND  CORS/GRAFTS ANGIOGRAPHY;  Surgeon: Adrian Prows, MD;  Location: [facility];  Service: Cardiovascular;  Laterality: N/A;  . LEFT HEART CATHETERIZATION WITH CORONARY/GRAFT ANGIOGRAM N/A 04/21/2011   Procedure: LEFT HEART CATHETERIZATION WITH Beatrix Fetters;  Surgeon: Laverda Page, MD;  Location: [facility] C[facility] LAB;  Service: Cardiovascular;  Laterality: N/A;  . PERCUTANEOUS CORONARY INTERVENTION-BALLOON ONLY Right 05/12/2011   Procedure: PERCUTANEOUS CORONARY INTERVENTION-BALLOON ONLY;  Surgeon: Laverda Page, MD;  Location: [facility] CATH LAB;  Service: Cardiovascular;  Laterality: Right;  . TOTAL HIP ARTHROPLASTY Left 12/13/2012   Procedure: TOTAL HIP ARTHROPLASTY ANTERIOR APPROACH;  Surgeon: Hessie Dibble, MD;  Location: [facility];  Service: Orthopedics;  Laterality: Left;  . TOTAL HIP ARTHROPLASTY Right 01/30/2014   Procedure: TOTAL HIP ARTHROPLASTY ANTERIOR APPROACH;  Surgeon: Hessie Dibble, MD;  Location: [facility];  Service: Orthopedics;  Laterality: Right;  . TUBAL LIGATION      Social History   Socioeconomic History  . Marital status: Divorced    Spouse name: Not on file  . Number of children: 4  . Years of education: Not on file  . Highest education level: Not on file  Occupational History  . Not on file  Social Needs  . Financial resource strain: Not hard at all  . Food insecurity    Worry: Never true    Inability: Never true  . Transportation needs    Medical: No  Non-medical: No  Tobacco Use  . Smoking status: Former Smoker    Packs/day: 0.50    Years: 2.00    Pack years: 1.00    Types: Cigarettes    Quit date: 12/08/1983    Years since quitting: 35.0  . Smokeless tobacco: Never Used  Substance and Sexual Activity  . Alcohol use: No  . Drug use: No  . Sexual activity: Not Currently  Lifestyle  . Physical activity    Days per week: 2 days    Minutes per session: 30 min  . Stress: Not at all  Relationships  . Social Herbalist on phone: Not on  file    Gets together: Not on file    Attends religious service: Not on file    Active member of club or organization: Not on file    Attends meetings of clubs or organizations: Not on file    Relationship status: Not on file  . Intimate partner violence    Fear of current or ex partner: No    Emotionally abused: No    Physically abused: No    Forced sexual activity: No  Other Topics Concern  . Not on file  Social History Narrative  . Not on file    Current Outpatient Medications on File Prior to Visit  Medication Sig Dispense Refill  . ACETAMINOPHEN PO Take 650 mg by mouth every 6 (six) hours as needed for moderate pain or headache.     . albuterol (VENTOLIN HFA) 108 (90 Base) MCG/ACT inhaler Inhale 2 puffs into the lungs every 4 (four) hours as needed for wheezing or shortness of breath. 6.7 g 3  . aspirin EC 81 MG tablet Take 1 tablet (81 mg total) by mouth daily. 90 tablet 3  . budesonide-formoterol (SYMBICORT) 160-4.5 MCG/ACT inhaler Inhale 2 puffs into the lungs every 4 (four) hours as needed (wheezing).    . cetirizine (ZYRTEC) 10 MG tablet Take 10 mg by mouth at bedtime.    . Cholecalciferol (VITAMIN D) 2000 UNITS CAPS Take 2,000 Units by mouth daily.     . clopidogrel (PLAVIX) 75 MG tablet Take 1 tablet (75 mg total) by mouth daily. 90 tablet 2  . dorzolamide-timolol (COSOPT) 22.3-6.8 MG/ML ophthalmic solution Place 1 drop into both eyes 3 (three) times daily.    Marland Kitchen ezetimibe (ZETIA) 10 MG tablet Take 1 tablet (10 mg total) by mouth daily. 90 tablet 2  . furosemide (LASIX) 40 MG tablet Take 1 tablet (40 mg total) by mouth daily. 90 tablet 3  . isosorbide-hydrALAZINE (BIDIL) 20-37.5 MG per tablet Take 2 tablets by mouth 3 (three) times daily. 1 TAB 2 TIMES PER DAY    . JANUVIA 100 MG tablet TAKE 1 TABLET EVERY DAY 90 tablet 0  . loratadine (CLARITIN) 10 MG tablet Take 10 mg by mouth daily.    . metoprolol succinate (TOPROL-XL) 50 MG 24 hr tablet Take 1 tablet (50 mg total) by  mouth daily. Take with or immediately following a meal. 30 tablet 2  . montelukast (SINGULAIR) 10 MG tablet Take 10 mg by mouth at bedtime.    . nitroGLYCERIN (NITROSTAT) 0.4 MG SL tablet Place 0.4 mg under the tongue every 5 (five) minutes as needed for chest pain.    Marland Kitchen nystatin (NYSTATIN) powder Apply to affected area tid prn 60 g 0  . pantoprazole (PROTONIX) 40 MG tablet Take 1 tablet (40 mg total) by mouth daily. 90 tablet 1  . ranolazine (RANEXA)  500 MG 12 hr tablet Take 2 tablets (1,000 mg total) by mouth 2 (two) times daily. 60 tablet 6  . rosuvastatin (CRESTOR) 40 MG tablet Take 1 tablet (40 mg total) by mouth daily. (Patient taking differently: Take 20 mg by mouth daily. ) 90 tablet 2  . sacubitril-valsartan (ENTRESTO) 97-103 MG Take 1 tablet by mouth 2 (two) times daily. (Patient taking differently: Take 1 tablet by mouth daily. ) 120 tablet 3  . spironolactone (ALDACTONE) 50 MG tablet TAKE 1 TABLET BY MOUTH EVERY DAY 30 tablet 3  . temazepam (RESTORIL) 15 MG capsule Take 15 mg by mouth at bedtime as needed for sleep.  1  . traMADol (ULTRAM) 50 MG tablet Take 1 tablet (50 mg total) by mouth every 6 (six) hours as needed. 20 tablet 0   No current facility-administered medications on file prior to visit.     Cardiovascular studies:  Carotid artery duplex  07-14-18 Severe stenosis in the right internal carotid artery (50-69%). Severe stenosis in the left internal carotid artery (50-69%). upper limit of spectrum. The left PSV internal/common carotid artery ratio of 5.94  is consistent with a stenosis of >70%. Antegrade right vertebral artery flow. Left vertebral artery flow is not visualized. Follow up in six months is appropriate if clinically indicated. Compared to the study done on  12/22/17, no significant change.  EKG 05/23/2018: Sinus rhythm. Incomplete LBBB. Baseline artifact seen.   Echocardiogram 05/10/2018: Left ventricle cavity is normal in size. Severe decrease in  global wall motion. Doppler evidence of grade II (pseudonormal) diastolic dysfunction, elevated LAP. LVEF 15-20%. Left atrial cavity is moderately dilated. Moderate (Grade II) mitral regurgitation. Mildly restricted mitral valve leaflets. Moderate tricuspid regurgitation. Estimated pulmonary artery systolic pressure 35  mmHg. Mild pulmonic regurgitation. No significant change compared to previous study on 06/02/2017.  Cath 11/25/2017: LM: 95% diffuse disease (Unchanged from prior cath in 06/2017) LAD: 100% ostially occluded (Unchanged from prior cath in 06/2017) LCx: 100% ostially occluded (Unchanged from prior cath in 06/2017) RCA: 100% known proximal occlusion (Not visualized today) LIMA-LAD: Patent with good distal flow SVG-RCA: Patent with atent ostial stent. No other significant stenosis. Distal RCA has moderate disease (Unchanged from prior cath in 06/2017). RCA to LCx collaterals seen   LVEDP 16-20 mmHg  Recommendation: While the patient may conceivably have some area of unprotected myocardium supplied by native circumflex, most of it seems collaterals from the distal RCA. Left main is nearly occluded, but has a robust LIMA to LAD graft which is patent. Essentially, there is no significant change in angiographic findings compared to post PCI images in 06/2017.  I suspect patient's pain may be due to small vessel disease or perhaps related to pericarditis, given her recent viral infection. I will obtain an echocardiogram before discharge. Recommend current antianginal and medical therapy for CAD, including increased dose of Ranexa to 1000 mg twice a day.  CT angiogram neck 02/01/2018: 80% stenosis and left internal carotid artery with dense calcification 60% stenosis and right internal carotid artery with calcified and noncalcified plaque Scattered atherosclerotic calcifications involving vertebral arteries bilaterally without other significant stenoses  Incidental finding of grade 1  anterolisthesis C2-C3, C3-C4. 4 mm thyroid nodule, and coronary artery disease  Labs: Results for KEANDREA, TAPLEY (MRN [id number]) as of 12/01/2018 09:30  Ref. Range 10/03/2018 11:39 10/14/2018 08:04 11/03/2018 11:21  Sodium Latest Ref Range: 134 - 144 mmol/L 141 141 142  Potassium Latest Ref Range: 3.5 - 5.2 mmol/L 4.4 4.2 4.6  Chloride  Latest Ref Range: 96 - 106 mmol/L 106 106 100  CO2 Latest Ref Range: 20 - 29 mmol/L _0 Glucose Latest Ref Range: 65 - 99 mg/dL 96 96 85  BUN Latest Ref Range: 8 - 27 mg/dL _1 Creatinine Latest Ref Range: 0.57 - 1.00 mg/dL 0.97 1.19 (H) 1.36 (H)  Calcium Latest Ref Range: 8.7 - 10.3 mg/dL 9.3 9.3 10.0  BUN/Creatinine Ratio Latest Ref Range: 12 - _2 GFR, Est Non African American Latest Ref Range: >59 mL/min/1.73 57 (L) 44 (L) 38 (L)  GFR, Est African American Latest Ref Range: >59 mL/min/1.73 65 51 (L) 43 (L)  Total CHOL/HDL Ratio Latest Ref Range: 0.0 - 4.4 ratio 4.5 (H)  2.9  Cholesterol, Total Latest Ref Range: 100 - 199 mg/dL 216 (H)  149  HDL Cholesterol Latest Ref Range: >39 mg/dL 48  51  LDL (calc) Latest Ref Range: 0 - 99 mg/dL 149 (H)  78  Triglycerides Latest Ref Range: 0 - 149 mg/dL 94  99  VLDL Cholesterol Cal Latest Ref Range: 5 - 40 mg/dL 19  20    Review of Systems  Constitution: Positive for malaise/fatigue. Negative for decreased appetite, weight gain and weight loss.  HENT: Negative for congestion.   Eyes: Negative for visual disturbance.  Cardiovascular: Positive for dyspnea on exertion and leg swelling. Negative for palpitations and syncope.  Respiratory: Positive for shortness of breath.   Endocrine: Negative for cold intolerance.  Hematologic/Lymphatic: Does not bruise/bleed easily.  Skin: Negative for itching and rash.  Musculoskeletal: Negative for myalgias.  Gastrointestinal: Negative for abdominal pain, nausea and vomiting.  Genitourinary: Negative for dysuria.  Neurological: Negative for dizziness and  weakness.  Psychiatric/Behavioral: The patient is not nervous/anxious.   All other systems reviewed and are negative.    Vitals:   12/01/18 1454  BP: 140/70  Pulse: 84  Temp: 98.7 F (37.1 C)  SpO2: 96%   Filed Weights   12/01/18 1454  Weight: 135 lb (61.2 kg)     Objective:   Physical Exam  Constitutional: She is oriented to person, place, and time. She appears well-developed and well-nourished. No distress.  HENT:  Head: Normocephalic and atraumatic.  Eyes: Pupils are equal, round, and reactive to light. Conjunctivae are normal.  Neck: No JVD present.  Cardiovascular: Normal rate, regular rhythm and intact distal pulses.  Pulmonary/Chest: Effort normal and breath sounds normal. She has no wheezes. She has no rales.  Abdominal: Soft. Bowel sounds are normal. There is no rebound.  Musculoskeletal:        General: Edema (1+ b/l) present.  Lymphadenopathy:    She has no cervical adenopathy.  Neurological: She is alert and oriented to person, place, and time. No cranial nerve deficit.  Skin: Skin is warm and dry.  Psychiatric: She has a normal mood and affect.  Nursing note and vitals reviewed.       Assessment & Recommendations:   78 year old African-American female with coronary artery disease status post CABG 1997, PCI to SVG 06/23/17, ischemic cardiomyopathy EF 20%, hypertension, hyperlipidemia, bilateral stable moderate carotid stenoses.    1. Coronary artery disease involving coronary bypass graft of native heart without angina pectoris Stable. No recurrent chest pain. Severe native vessel disease with occluded native arteries and ostial or proximal areas. Patent LIMA-LAD, SVG-RCA grafts with RCA to left circumflex collaterals. Diffuse distal RCA artery disease. Okay to contnue aspirin and plavix given complex CAD and absence of  bleeding.   2. Ischemic cardiomyopathy HFrEF NYHA class II-III symptoms.  EF remains 15-20% on guideline directed heart failure  therapy.  Continue Entresto to 97-103 mg bid, spironolactone 50 mg daily, metoprolol succinate 50 mg daily. While Cr has increased with, given significant improvement in heart failure symptoms, I would continue the above regimen. Reduce lasix to as needed. Unable to add [place] given CKD.  I will re refer to Dr. Caryl Comes to consider BiVICD placement now that blood pressure is better controlled.   3. Carotid artery stenoses: Seen by vascular surgery Dr. Donzetta Matters. Thought to have moderate stable disease. Asymptomatic. Will repeat carotid duplex. Continue medical management.   4. Hyperlipidemia: Improved on rosuvastatin 40 mg daily.   F/u in 2-3 months.  [name], MD [facility] Pager: [phone] Office: [phone] If no answer Cell [phone]

## 2018-12-03 LAB — CBC WITH DIFFERENTIAL/PLATELET
Basophils Absolute: 0 10*3/uL (ref 0.0–0.2)
Basos: 1 %
EOS (ABSOLUTE): 0.2 10*3/uL (ref 0.0–0.4)
Eos: 4 %
Hematocrit: 45.1 % (ref 34.0–46.6)
Hemoglobin: 14.6 g/dL (ref 11.1–15.9)
Immature Grans (Abs): 0 10*3/uL (ref 0.0–0.1)
Immature Granulocytes: 0 %
Lymphocytes Absolute: 1.8 10*3/uL (ref 0.7–3.1)
Lymphs: 31 %
MCH: 30.8 pg (ref 26.6–33.0)
MCHC: 32.4 g/dL (ref 31.5–35.7)
MCV: 95 fL (ref 79–97)
Monocytes Absolute: 0.5 10*3/uL (ref 0.1–0.9)
Monocytes: 9 %
Neutrophils Absolute: 3.1 10*3/uL (ref 1.4–7.0)
Neutrophils: 55 %
Platelets: 232 10*3/uL (ref 150–450)
RBC: 4.74 x10E6/uL (ref 3.77–5.28)
RDW: 12.7 % (ref 11.7–15.4)
WBC: 5.6 10*3/uL (ref 3.4–10.8)

## 2018-12-03 LAB — HEPATIC FUNCTION PANEL
ALT: 12 IU/L (ref 0–32)
AST: 14 IU/L (ref 0–40)
Albumin: 4.4 g/dL (ref 3.7–4.7)
Alkaline Phosphatase: 105 IU/L (ref 39–117)
Bilirubin Total: 0.8 mg/dL (ref 0.0–1.2)
Bilirubin, Direct: 0.21 mg/dL (ref 0.00–0.40)
Total Protein: 7.4 g/dL (ref 6.0–8.5)

## 2018-12-03 LAB — FOOD ALLERGY PROFILE
Allergen Corn, IgE: <0.1 kU/L
Clam IgE: <0.1 kU/L
Codfish IgE: <0.1 kU/L
Egg White IgE: <0.1 kU/L
Milk IgE: <0.1 kU/L
Peanut IgE: <0.1 kU/L
Scallop IgE: <0.1 kU/L
Sesame Seed IgE: <0.1 kU/L
Shrimp IgE: <0.1 kU/L
Soybean IgE: <0.1 kU/L
Walnut IgE: <0.1 kU/L
Wheat IgE: <0.1 kU/L

## 2018-12-03 LAB — HEMOGLOBIN A1C
Est. average glucose Bld gHb Est-mCnc: 120 mg/dL
Hgb A1c MFr Bld: 5.8 % — ABNORMAL HIGH (ref 4.8–5.6)

## 2018-12-14 ENCOUNTER — Other Ambulatory Visit: Payer: Self-pay | Admitting: Internal Medicine

## 2018-12-14 ENCOUNTER — Other Ambulatory Visit: Payer: Self-pay | Admitting: Cardiology

## 2018-12-20 ENCOUNTER — Other Ambulatory Visit: Payer: Self-pay

## 2018-12-21 ENCOUNTER — Ambulatory Visit (INDEPENDENT_AMBULATORY_CARE_PROVIDER_SITE_OTHER): Payer: Medicare HMO | Admitting: Internal Medicine

## 2018-12-21 ENCOUNTER — Encounter: Payer: Self-pay | Admitting: Internal Medicine

## 2018-12-21 VITALS — BP 142/78 | HR 85 | Temp 98.2°F | Ht 59.0 in | Wt 136.6 lb

## 2018-12-21 DIAGNOSIS — I10 Essential (primary) hypertension: Secondary | ICD-10-CM | POA: Diagnosis not present

## 2018-12-21 DIAGNOSIS — J45909 Unspecified asthma, uncomplicated: Secondary | ICD-10-CM | POA: Diagnosis not present

## 2018-12-21 DIAGNOSIS — J209 Acute bronchitis, unspecified: Secondary | ICD-10-CM | POA: Diagnosis not present

## 2018-12-21 DIAGNOSIS — Z1159 Encounter for screening for other viral diseases: Secondary | ICD-10-CM | POA: Diagnosis not present

## 2018-12-21 DIAGNOSIS — Z139 Encounter for screening, unspecified: Secondary | ICD-10-CM | POA: Diagnosis not present

## 2018-12-21 MED ORDER — TRIAMCINOLONE ACETONIDE 40 MG/ML IJ SUSP
60.0000 mg | Freq: Once | INTRAMUSCULAR | Status: AC
  Administered 2018-12-21: 60 mg via INTRAMUSCULAR

## 2018-12-21 MED ORDER — CEFTRIAXONE SODIUM 500 MG IJ SOLR
500.0000 mg | Freq: Once | INTRAMUSCULAR | Status: AC
  Administered 2018-12-21: 500 mg via INTRAMUSCULAR

## 2018-12-21 MED ORDER — HYDROCODONE-HOMATROPINE 5-1.5 MG/5ML PO SYRP: 5.0000 mL | ORAL_SOLUTION | Freq: Four times a day (QID) | ORAL | 0 refills | Status: DC | PRN

## 2018-12-21 NOTE — Patient Instructions (Signed)
Acute Bronchitis, Adult Acute bronchitis is when air tubes (bronchi) in the lungs suddenly get swollen. The condition can make it hard to breathe. It can also cause these symptoms:  A cough.  Coughing up clear, yellow, or green mucus.  Wheezing.  Chest congestion.  Shortness of breath.  A fever.  Body aches.  Chills.  A sore throat. Follow these instructions at home:  Medicines  Take over-the-counter and prescription medicines only as told by your doctor.  If you were prescribed an antibiotic medicine, take it as told by your doctor. Do not stop taking the antibiotic even if you start to feel better. General instructions  Rest.  Drink enough fluids to keep your pee (urine) pale yellow.  Avoid smoking and secondhand smoke. If you smoke and you need help quitting, ask your doctor. Quitting will help your lungs heal faster.  Use an inhaler, cool mist vaporizer, or humidifier as told by your doctor.  Keep all follow-up visits as told by your doctor. This is important. How is this prevented? To lower your risk of getting this condition again:  Wash your hands often with soap and water. If you cannot use soap and water, use hand sanitizer.  Avoid contact with people who have cold symptoms.  Try not to touch your hands to your mouth, nose, or eyes.  Make sure to get the flu shot every year. Contact a doctor if:  Your symptoms do not get better in 2 weeks. Get help right away if:  You cough up blood.  You have chest pain.  You have very bad shortness of breath.  You become dehydrated.  You faint (pass out) or keep feeling like you are going to pass out.  You keep throwing up (vomiting).  You have a very bad headache.  Your fever or chills gets worse. This information is not intended to replace advice given to you by your health care provider. Make sure you discuss any questions you have with your health care provider. Document Released: 09/09/2007 Document  Revised: 03/05/2017 Document Reviewed: 09/11/2015 Elsevier Patient Education  2020 Elsevier Inc.  

## 2018-12-22 ENCOUNTER — Telehealth: Payer: Self-pay

## 2018-12-22 LAB — NOVEL CORONAVIRUS, NAA: SARS-CoV-2, NAA: NOT DETECTED

## 2018-12-22 NOTE — Telephone Encounter (Signed)
Called to check on pt . She states that she feels better than yesterday.

## 2019-01-01 NOTE — Progress Notes (Signed)
Subjective:     Patient ID: Lynn Carey , female    DOB: 25-Nov-1940 , 78 y.o.   MRN: [id number]   Chief Complaint  Patient presents with  . Cough    HPI  Cough This is a new problem. The current episode started in the past 7 days. The problem has been gradually worsening. The problem occurs every few minutes. The cough is productive of sputum. Associated symptoms include postnasal drip. Pertinent negatives include no chest pain, chills, fever, sore throat or shortness of breath. The symptoms are aggravated by lying down. She has tried a beta-agonist inhaler for the symptoms. The treatment provided no relief. Her past medical history is significant for asthma.     Past Medical History:  Diagnosis Date  . Anginal pain ([place])    h/o, denies today   . Arthritis    hip - both, shot in L shoulder- 2 weeks ago  . Asthma   . Carotid artery occlusion   . CHF (congestive heart failure) ([place])   . Complication of anesthesia   . Coronary artery disease   . Diabetes mellitus without complication ([place])   . GERD (gastroesophageal reflux disease)   . Hypertension    followed by Dr. Einar Gip  . MYOCARDIAL INFARCTION 05/25/2007   Qualifier: History of  By: Milana Obey, Doroteo Bradford    . Myocardial infarction ([place]) 1997  . PONV (postoperative nausea and vomiting)   . Shortness of breath      Family History  Problem Relation Age of Onset  . Allergies Mother   . Asthma Mother   . Heart disease Mother   . Heart attack Mother   . Allergies Son   . Asthma Son   . Heart disease Father   . Heart attack Father   . Breast cancer Daughter 27     Current Outpatient Medications:  .  ACETAMINOPHEN PO, Take 650 mg by mouth every 6 (six) hours as needed for moderate pain or headache. , Disp: , Rfl:  .  albuterol (VENTOLIN HFA) 108 (90 Base) MCG/ACT inhaler, Inhale 2 puffs into the lungs every 4 (four) hours as needed for wheezing or shortness of breath., Disp: 6.7 g, Rfl: 3 .  aspirin EC 81 MG tablet,  Take 1 tablet (81 mg total) by mouth daily., Disp: 90 tablet, Rfl: 3 .  BIDIL 20-37.5 MG tablet, TAKE 2 TABLETS THREE TIMES DAILY, Disp: 540 tablet, Rfl: 3 .  budesonide-formoterol (SYMBICORT) 160-4.5 MCG/ACT inhaler, Inhale 2 puffs into the lungs every 4 (four) hours as needed (wheezing)., Disp: , Rfl:  .  cetirizine (ZYRTEC) 10 MG tablet, Take 10 mg by mouth at bedtime., Disp: , Rfl:  .  Cholecalciferol (VITAMIN D) 2000 UNITS CAPS, Take 2,000 Units by mouth daily. , Disp: , Rfl:  .  clopidogrel (PLAVIX) 75 MG tablet, Take 1 tablet (75 mg total) by mouth daily., Disp: 90 tablet, Rfl: 2 .  dorzolamide-timolol (COSOPT) 22.3-6.8 MG/ML ophthalmic solution, Place 1 drop into both eyes 3 (three) times daily., Disp: , Rfl:  .  ezetimibe (ZETIA) 10 MG tablet, Take 1 tablet (10 mg total) by mouth daily., Disp: 90 tablet, Rfl: 2 .  furosemide (LASIX) 40 MG tablet, Take 1 tablet (40 mg total) by mouth daily., Disp: 90 tablet, Rfl: 3 .  HYDROcodone-homatropine (HYDROMET) 5-1.5 MG/5ML syrup, Take 5 mLs by mouth every 6 (six) hours as needed., Disp: 120 mL, Rfl: 0 .  JANUVIA 100 MG tablet, TAKE 1 TABLET EVERY DAY, Disp: 90  tablet, Rfl: 0 .  loratadine (CLARITIN) 10 MG tablet, Take 10 mg by mouth daily., Disp: , Rfl:  .  metoprolol succinate (TOPROL-XL) 50 MG 24 hr tablet, Take 1 tablet (50 mg total) by mouth daily. Take with or immediately following a meal., Disp: 30 tablet, Rfl: 2 .  montelukast (SINGULAIR) 10 MG tablet, Take 10 mg by mouth at bedtime., Disp: , Rfl:  .  nitroGLYCERIN (NITROSTAT) 0.4 MG SL tablet, Place 0.4 mg under the tongue every 5 (five) minutes as needed for chest pain., Disp: , Rfl:  .  nystatin (NYSTATIN) powder, Apply to affected area tid prn, Disp: 60 g, Rfl: 0 .  pantoprazole (PROTONIX) 40 MG tablet, Take 1 tablet (40 mg total) by mouth daily., Disp: 90 tablet, Rfl: 1 .  ranolazine (RANEXA) 500 MG 12 hr tablet, Take 2 tablets (1,000 mg total) by mouth 2 (two) times daily., Disp: 60  tablet, Rfl: 6 .  rosuvastatin (CRESTOR) 40 MG tablet, Take 1 tablet (40 mg total) by mouth daily. (Patient taking differently: Take 20 mg by mouth daily. ), Disp: 90 tablet, Rfl: 2 .  sacubitril-valsartan (ENTRESTO) 97-103 MG, Take 1 tablet by mouth 2 (two) times daily. (Patient taking differently: Take 1 tablet by mouth daily. ), Disp: 120 tablet, Rfl: 3 .  spironolactone (ALDACTONE) 50 MG tablet, TAKE 1 TABLET BY MOUTH EVERY DAY, Disp: 30 tablet, Rfl: 3 .  temazepam (RESTORIL) 15 MG capsule, Take 15 mg by mouth at bedtime as needed for sleep., Disp: , Rfl: 1 .  traMADol (ULTRAM) 50 MG tablet, Take 1 tablet (50 mg total) by mouth every 6 (six) hours as needed., Disp: 20 tablet, Rfl: 0   Allergies  Allergen Reactions  . Lisinopril Cough     Review of Systems  Constitutional: Negative.  Negative for chills and fever.  HENT: Positive for postnasal drip. Negative for sore throat.   Respiratory: Positive for cough. Negative for shortness of breath.   Cardiovascular: Negative.  Negative for chest pain.  Gastrointestinal: Negative.   Neurological: Negative.   Psychiatric/Behavioral: Negative.      Today's Vitals   12/21/18 1004  BP: (!) 142/78  Pulse: 85  Temp: 98.2 F (36.8 C)  TempSrc: Oral  SpO2: 98%  Weight: 136 lb 9.6 oz (62 kg)  Height: 4\' 11"  (1.499 m)   Body mass index is 27.59 kg/m.   Objective:  Physical Exam Vitals signs and nursing note reviewed.  Constitutional:      Appearance: Normal appearance.  HENT:     Head: Normocephalic and atraumatic.  Cardiovascular:     Rate and Rhythm: Normal rate and regular rhythm.     Heart sounds: Normal heart sounds.  Pulmonary:     Effort: Pulmonary effort is normal.     Breath sounds: Rhonchi present.  Skin:    General: Skin is warm.  Neurological:     General: No focal deficit present.     Mental Status: She is alert.  Psychiatric:        Mood and Affect: Mood normal.        Behavior: Behavior normal.          Assessment And Plan:     1. Acute bronchitis, unspecified organism  Due to underlying asthma, she was given both Rocephin and Kenalog.  She will use albuterol nebulizer at home as needed. She will let me know if her sx persist. She was also given rx hydromet syrup to use prn. Review of the [place]  CSRS was performed in accordance of the [place] prior to dispensing any controlled drugs.  - triamcinolone acetonide (KENALOG-40) injection 60 mg - cefTRIAXone (ROCEPHIN) injection 500 mg  2. Encounter for screening  She is agreeable to COVID testing. She denies having any recent known contacts. Her grandson was diagnosed several months ago.   - Novel Coronavirus, NAA ([facility]); Future - Novel Coronavirus, NAA ([facility])        Maximino Greenland, MD    THE PATIENT IS ENCOURAGED TO PRACTICE SOCIAL DISTANCING DUE TO THE COVID-19 PANDEMIC.

## 2019-01-02 ENCOUNTER — Other Ambulatory Visit: Payer: Self-pay

## 2019-01-02 MED ORDER — SITAGLIPTIN PHOSPHATE 100 MG PO TABS: 100.0000 mg | ORAL_TABLET | Freq: Every day | ORAL | 0 refills | Status: DC

## 2019-01-02 MED ORDER — SITAGLIPTIN PHOSPHATE 100 MG PO TABS: 100.0000 mg | ORAL_TABLET | Freq: Every day | ORAL | 3 refills | Status: DC

## 2019-01-03 ENCOUNTER — Telehealth: Payer: Self-pay

## 2019-01-03 ENCOUNTER — Other Ambulatory Visit: Payer: Self-pay

## 2019-01-03 DIAGNOSIS — I5022 Chronic systolic (congestive) heart failure: Secondary | ICD-10-CM

## 2019-01-03 MED ORDER — BIDIL 20-37.5 MG PO TABS: 2.0000 | ORAL_TABLET | Freq: Three times a day (TID) | ORAL | 0 refills | Status: DC

## 2019-01-04 ENCOUNTER — Telehealth: Payer: Self-pay | Admitting: Internal Medicine

## 2019-01-04 NOTE — Chronic Care Management (AMB) (Signed)
Chronic Care Management   Note  01/04/2019 Name: [name] MRN: [id number] DOB: 1941/03/17  Lynn Carey is a 78 y.o. year old female who is a primary care patient of Glendale Chard, MD. I reached out to [name] by phone today in response to a referral sent by Ms. Arman Bogus Devore's health plan.     Ms. Constantin was given information about Chronic Care Management services today including:  1. CCM service includes personalized support from designated clinical staff supervised by her physician, including individualized plan of care and coordination with other care providers 2. 24/7 contact phone numbers for assistance for urgent and routine care needs. 3. Service will only be billed when office clinical staff spend 20 minutes or more in a month to coordinate care. 4. Only one practitioner may furnish and bill the service in a calendar month. 5. The patient may stop CCM services at any time (effective at the end of the month) by phone call to the office staff. 6. The patient will be responsible for cost sharing (co-pay) of up to 20% of the service fee (after annual deductible is met).  Patient agreed to services and verbal consent obtained.   Follow up plan: Telephone appointment with CCM team member scheduled for: 01/25/2019  Amo  ??[email or web address]   ??[phone]

## 2019-01-05 NOTE — Telephone Encounter (Signed)
error 

## 2019-01-09 ENCOUNTER — Ambulatory Visit: Payer: Medicare HMO

## 2019-01-09 ENCOUNTER — Other Ambulatory Visit: Payer: Self-pay

## 2019-01-09 DIAGNOSIS — I6523 Occlusion and stenosis of bilateral carotid arteries: Secondary | ICD-10-CM

## 2019-01-15 ENCOUNTER — Other Ambulatory Visit: Payer: Self-pay | Admitting: Cardiology

## 2019-01-15 DIAGNOSIS — I6523 Occlusion and stenosis of bilateral carotid arteries: Secondary | ICD-10-CM

## 2019-01-25 ENCOUNTER — Telehealth: Payer: Medicare HMO

## 2019-01-30 ENCOUNTER — Ambulatory Visit: Payer: Self-pay | Admitting: Pharmacist

## 2019-01-30 NOTE — Progress Notes (Signed)
  Chronic Care Management   Outreach Note  01/30/2019 Name: Lynn Carey MRN: [id number] DOB: Jan 15, 1941  Referred by: Glendale Chard, MD Reason for referral : Chronic Care Management   An unsuccessful telephone outreach was attempted today. The patient was referred to the case management team by for assistance with care management and care coordination.   Follow Up Plan: The care management team will reach out to the patient again over the next 7-10 business days.   SIGNATURE Regina Eck, PharmD, BCPS Clinical Pharmacist, [facility] [place]: [phone]

## 2019-02-03 ENCOUNTER — Other Ambulatory Visit: Payer: Self-pay

## 2019-02-03 ENCOUNTER — Encounter: Payer: Self-pay | Admitting: Cardiology

## 2019-02-03 ENCOUNTER — Ambulatory Visit (INDEPENDENT_AMBULATORY_CARE_PROVIDER_SITE_OTHER): Payer: Medicare HMO | Admitting: Cardiology

## 2019-02-03 VITALS — BP 157/86 | HR 83 | Temp 98.0°F | Ht 60.0 in | Wt 139.2 lb

## 2019-02-03 DIAGNOSIS — I10 Essential (primary) hypertension: Secondary | ICD-10-CM

## 2019-02-03 DIAGNOSIS — I5022 Chronic systolic (congestive) heart failure: Secondary | ICD-10-CM | POA: Diagnosis not present

## 2019-02-03 DIAGNOSIS — I6523 Occlusion and stenosis of bilateral carotid arteries: Secondary | ICD-10-CM | POA: Diagnosis not present

## 2019-02-03 DIAGNOSIS — E782 Mixed hyperlipidemia: Secondary | ICD-10-CM

## 2019-02-03 DIAGNOSIS — R053 Chronic cough: Secondary | ICD-10-CM

## 2019-02-03 DIAGNOSIS — R05 Cough: Secondary | ICD-10-CM | POA: Diagnosis not present

## 2019-02-03 MED ORDER — AMLODIPINE BESYLATE 2.5 MG PO TABS: 2.5000 mg | ORAL_TABLET | Freq: Every day | ORAL | 5 refills | Status: DC
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
## 2019-02-10 ENCOUNTER — Ambulatory Visit: Payer: Self-pay | Admitting: Pharmacist

## 2019-02-10 NOTE — Progress Notes (Signed)
  Chronic Care Management   Outreach Note  02/10/2019 Name: Lynn Carey MRN: [id number] DOB: 04-15-1940  Referred by: Glendale Chard, MD Reason for referral : Chronic Care Management   A second unsuccessful telephone outreach was attempted today. The patient was referred to the case management team for assistance with care management and care coordination.   Follow Up Plan: The care management team will reach out to the patient again over the next 14 days. Patient's mailbox was full and is not accepting messages.  Will follow up.  SIGNATURE Regina Eck, PharmD, BCPS Clinical Pharmacist, [facility] [place]: [phone]

## 2019-02-13 ENCOUNTER — Other Ambulatory Visit: Payer: Self-pay

## 2019-02-13 ENCOUNTER — Other Ambulatory Visit: Payer: Self-pay | Admitting: Cardiology

## 2019-02-13 MED ORDER — SITAGLIPTIN PHOSPHATE 100 MG PO TABS: 100.0000 mg | ORAL_TABLET | Freq: Every day | ORAL | 3 refills | Status: DC

## 2019-02-17 ENCOUNTER — Ambulatory Visit: Payer: Self-pay | Admitting: Pharmacist

## 2019-02-17 NOTE — Progress Notes (Signed)
  Chronic Care Management   Outreach Note  02/17/2019 Name: Lynn Carey MRN: [id number] DOB: December 14, 1940  Referred by: Glendale Chard, MD Reason for referral : Chronic Care Management   3 unsuccessful outreach attempts have been made in an effort to contact this established care management patient for the purpose of scheduling a follow up appointment with a team member.   Follow Up Plan: The patient has been provided with contact information for the care management team and has been advised to call with any health related questions or concerns.  No further follow up required  SIGNATURE [name], PharmD, [place] Pharmacist, [place]: [phone]

## 2019-03-16 ENCOUNTER — Ambulatory Visit: Payer: Medicare HMO | Admitting: Internal Medicine

## 2019-03-16 ENCOUNTER — Other Ambulatory Visit: Payer: Self-pay | Admitting: Cardiology

## 2019-03-16 ENCOUNTER — Encounter: Payer: Self-pay | Admitting: Internal Medicine

## 2019-03-16 ENCOUNTER — Other Ambulatory Visit: Payer: Self-pay

## 2019-03-16 VITALS — BP 148/82 | HR 79 | Ht 60.0 in | Wt 141.6 lb

## 2019-03-16 DIAGNOSIS — I5022 Chronic systolic (congestive) heart failure: Secondary | ICD-10-CM | POA: Diagnosis not present

## 2019-03-16 DIAGNOSIS — J449 Chronic obstructive pulmonary disease, unspecified: Secondary | ICD-10-CM | POA: Diagnosis not present

## 2019-03-16 DIAGNOSIS — I447 Left bundle-branch block, unspecified: Secondary | ICD-10-CM | POA: Diagnosis not present

## 2019-03-16 DIAGNOSIS — I255 Ischemic cardiomyopathy: Secondary | ICD-10-CM

## 2019-03-16 NOTE — Patient Instructions (Signed)
Medication Instructions:  Your physician recommends that you continue on your current medications as directed. Please refer to the Current Medication list given to you today.   Labwork: None ordered.   Testing/Procedures:Your physician has recommended that you have a defibrillator inserted. An implantable cardioverter defibrillator (ICD) is a small device that is placed in your chest or, in rare cases, your abdomen. This device uses electrical pulses or shocks to help control life-threatening, irregular heartbeats that could lead the heart to suddenly stop beating (sudden cardiac arrest). Leads are attached to the ICD that goes into your heart. This is done in the hospital and usually requires an overnight stay. Please see the instruction sheet given to you today for more information.   Cardioverter Defibrillator Implantation  An implantable cardioverter defibrillator (ICD) is a small device that is placed under the skin in the chest or abdomen. An ICD consists of a battery, a small computer (pulse generator), and wires (leads) that go into the heart. An ICD is used to detect and correct two types of dangerous irregular heartbeats (arrhythmias):  A rapid heart rhythm (tachycardia).  An arrhythmia in which the lower chambers of the heart (ventricles) contract in an uncoordinated way (fibrillation). When an ICD detects tachycardia, it sends a low-energy shock to the heart to restore the heartbeat to normal (cardioversion). This signal is usually painless. If cardioversion does not work or if the ICD detects fibrillation, it delivers a high-energy shock to the heart (defibrillation) to restart the heart. This shock may feel like a strong jolt in the chest. Your health care provider may prescribe an ICD if:  You have had an arrhythmia that originated in the ventricles.  Your heart has been damaged by a disease or heart condition. Sometimes, ICDs are programmed to act as a device called a  pacemaker. Pacemakers can be used to treat a slow heartbeat (bradycardia) or tachycardia by taking over the heart rate with electrical impulses. Tell a health care provider about:  Any allergies you have.  All medicines you are taking, including vitamins, herbs, eye drops, creams, and over-the-counter medicines.  Any problems you or family members have had with anesthetic medicines.  Any blood disorders you have.  Any surgeries you have had.  Any medical conditions you have.  Whether you are pregnant or may be pregnant. What are the risks? Generally, this is a safe procedure. However, problems may occur, including:  Swelling, bleeding, or bruising.  Infection.  Blood clots.  Damage to other structures or organs, such as nerves, blood vessels, or the heart.  Allergic reactions to medicines used during the procedure. What happens before the procedure? Staying hydrated Follow instructions from your health care provider about hydration, which may include:  Up to 2 hours before the procedure - you may continue to drink clear liquids, such as water, clear fruit juice, black coffee, and plain tea. Eating and drinking restrictions Follow instructions from your health care provider about eating and drinking, which may include:  8 hours before the procedure - stop eating heavy meals or foods such as meat, fried foods, or fatty foods.  6 hours before the procedure - stop eating light meals or foods, such as toast or cereal.  6 hours before the procedure - stop drinking milk or drinks that contain milk.  2 hours before the procedure - stop drinking clear liquids. Medicine Ask your health care provider about:  Changing or stopping your normal medicines. This is important if you take diabetes medicines or  blood thinners.  Taking medicines such as aspirin and ibuprofen. These medicines can thin your blood. Do not take these medicines before your procedure if your doctor tells you not  to. Tests  You may have blood tests.  You may have a test to check the electrical signals in your heart (electrocardiogram, ECG).  You may have imaging tests, such as a chest X-ray. General instructions  For 24 hours before the procedure, stop using products that contain nicotine or tobacco, such as cigarettes and e-cigarettes. If you need help quitting, ask your health care provider.  Plan to have someone take you home from the hospital or clinic.  You may be asked to shower with a germ-killing soap. What happens during the procedure?  To reduce your risk of infection: ? Your health care team will wash or sanitize their hands. ? Your skin will be washed with soap. ? Hair may be removed from the surgical area.  Small monitors will be put on your body. They will be used to check your heart, blood pressure, and oxygen level.  An IV tube will be inserted into one of your veins.  You will be given one or more of the following: ? A medicine to help you relax (sedative). ? A medicine to numb the area (local anesthetic). ? A medicine to make you fall asleep (general anesthetic).  Leads will be guided through a blood vessel into your heart and attached to your heart muscles. Depending on the ICD, the leads may go into one ventricle or they may go into both ventricles and into an upper chamber of the heart. An X-ray machine (fluoroscope) will be usedto help guide the leads.  A small incision will be made to create a deep pocket under your skin.  The pulse generator will be placed into the pocket.  The ICD will be tested.  The incision will be closed with stitches (sutures), skin glue, or staples.  A bandage (dressing) will be placed over the incision. This procedure may vary among health care providers and hospitals. What happens after the procedure?  Your blood pressure, heart rate, breathing rate, and blood oxygen level will be monitored often until the medicines you were given  have worn off.  A chest X-ray will be taken to check that the ICD is in the right place.  You will need to stay in the hospital for 1-2 days so your health care provider can make sure your ICD is working.  Do not drive for 24 hours if you received a sedative. Ask your health care provider when it is safe for you to drive.  You may be given an identification card explaining that you have an ICD. Summary  An implantable cardioverter defibrillator (ICD) is a small device that is placed under the skin in the chest or abdomen. It is used to detect and correct dangerous irregular heartbeats (arrhythmias).  An ICD consists of a battery, a small computer (pulse generator), and wires (leads) that go into the heart.  When an ICD detects rapid heart rhythm (tachycardia), it sends a low-energy shock to the heart to restore the heartbeat to normal (cardioversion). If cardioversion does not work or if the ICD detects uncoordinated heart contractions (fibrillation), it delivers a high-energy shock to the heart (defibrillation) to restart the heart.  You will need to stay in the hospital for 1-2 days to make sure your ICD is working. This information is not intended to replace advice given to you by your  health care provider. Make sure you discuss any questions you have with your health care provider. Document Released: 12/13/2001 Document Revised: 03/05/2017 Document Reviewed: 04/01/2016 Elsevier Patient Education  [place]: January 4th January 15 January 20 January 29 *Please know that you will need a Covid test and quarantine 3 days prior to procedure.  Any Other Special Instructions Will Be Listed Below (If Applicable).     If you need a refill on your cardiac medications before your next appointment, please call your pharmacy.

## 2019-03-16 NOTE — Progress Notes (Signed)
Patient Care Team: Glendale Chard, MD as PCP - General (Internal Medicine) Lavera Guise, [facility] (Pharmacist)   HPI  STEELE Carey is a 78 y.o. female Seen in secondary consultation for consideration/reconsideration of an ICD.  She has a history of ischemic heart disease with remote bypass surgery 1997 and PCI 3/19.  She has modest dyspnea on exertion with extreme effort i.e. walking too fast.  Typically associated with preceding chest pain  High blood pressure-difficult to control  DATE TEST EF   8/19 LHC  % LIMA-LADp; SVG-RCAP LM 95   3V T   8/19 Echo  15%   2/20 Echo   15-20 %           Records and Results Reviewed  Outside cardiology records and ECGs MP  Past Medical History:  Diagnosis Date  . Anginal pain ([place])    h/o, denies today   . Arthritis    hip - both, shot in L shoulder- 2 weeks ago  . Asthma   . Carotid artery occlusion   . CHF (congestive heart failure) ([place])   . Complication of anesthesia   . Coronary artery disease   . Diabetes mellitus without complication ([place])   . GERD (gastroesophageal reflux disease)   . Hypertension    followed by Dr. Einar Gip  . MYOCARDIAL INFARCTION 05/25/2007   Qualifier: History of  By: Milana Obey, Doroteo Bradford    . Myocardial infarction ([place]) 1997  . PONV (postoperative nausea and vomiting)   . Shortness of breath     Past Surgical History:  Procedure Laterality Date  . CARDIAC CATHETERIZATION  05/2012   see note  . CORONARY ARTERY BYPASS GRAFT  [address]  . CORONARY STENT INTERVENTION Right 06/18/2017   Procedure: CORONARY STENT INTERVENTION;  Surgeon: Adrian Prows, MD;  Location: [place] [facility];  Service: Cardiovascular;  Laterality: Right;  . EYE SURGERY     both eyes, laser procedure  . JOINT REPLACEMENT Left 12/13/12   hip  . LEFT HEART CATH AND CORONARY ANGIOGRAPHY N/A 11/25/2017   Procedure: LEFT HEART CATH AND CORONARY ANGIOGRAPHY;  Surgeon: [name], MD;  Location: [place] [facility]  LAB;  Service: Cardiovascular;  Laterality: N/A;  . LEFT HEART CATH AND CORS/GRAFTS ANGIOGRAPHY N/A 06/18/2017   Procedure: LEFT HEART CATH AND CORS/GRAFTS ANGIOGRAPHY;  Surgeon: Adrian Prows, MD;  Location: [place] [facility];  Service: Cardiovascular;  Laterality: N/A;  . LEFT HEART CATHETERIZATION WITH CORONARY/GRAFT ANGIOGRAM N/A 04/21/2011   Procedure: LEFT HEART CATHETERIZATION WITH Beatrix Fetters;  Surgeon: Laverda Page, MD;  Location: [facility] CATH LAB;  Service: Cardiovascular;  Laterality: N/A;  . PERCUTANEOUS CORONARY INTERVENTION-BALLOON ONLY Right 05/12/2011   Procedure: PERCUTANEOUS CORONARY INTERVENTION-BALLOON ONLY;  Surgeon: Laverda Page, MD;  Location: [facility] CATH LAB;  Service: Cardiovascular;  Laterality: Right;  . TOTAL HIP ARTHROPLASTY Left 12/13/2012   Procedure: TOTAL HIP ARTHROPLASTY ANTERIOR APPROACH;  Surgeon: Hessie Dibble, MD;  Location: [place];  Service: Orthopedics;  Laterality: Left;  . TOTAL HIP ARTHROPLASTY Right 01/30/2014   Procedure: TOTAL HIP ARTHROPLASTY ANTERIOR APPROACH;  Surgeon: Hessie Dibble, MD;  Location: [place];  Service: Orthopedics;  Laterality: Right;  . TUBAL LIGATION      Current Meds  Medication Sig  . ACETAMINOPHEN PO Take 650 mg by mouth every 6 (six) hours as needed for moderate pain or headache.   . albuterol (VENTOLIN HFA) 108 (90 Base) MCG/ACT inhaler Inhale 2  puffs into the lungs every 4 (four) hours as needed for wheezing or shortness of breath.  Marland Kitchen amLODipine (NORVASC) 2.5 MG tablet Take 1 tablet (2.5 mg total) by mouth daily.  Marland Kitchen aspirin EC 81 MG tablet Take 1 tablet (81 mg total) by mouth daily.  . budesonide-formoterol (SYMBICORT) 160-4.5 MCG/ACT inhaler Inhale 2 puffs into the lungs every 4 (four) hours as needed (wheezing).  . cetirizine (ZYRTEC) 10 MG tablet Take 10 mg by mouth at bedtime.  . Cholecalciferol (VITAMIN D) 2000 UNITS CAPS Take 2,000 Units by mouth daily.   . clopidogrel (PLAVIX) 75 MG tablet Take 1 tablet (75  mg total) by mouth daily.  . dorzolamide-timolol (COSOPT) 22.3-6.8 MG/ML ophthalmic solution Place 1 drop into both eyes 3 (three) times daily.  Marland Kitchen ezetimibe (ZETIA) 10 MG tablet Take 1 tablet (10 mg total) by mouth daily.  . furosemide (LASIX) 40 MG tablet Take 1 tablet (40 mg total) by mouth daily.  Marland Kitchen HYDROcodone-homatropine (HYDROMET) 5-1.5 MG/5ML syrup Take 5 mLs by mouth every 6 (six) hours as needed.  . isosorbide-hydrALAZINE (BIDIL) 20-37.5 MG tablet Take 2 tablets by mouth 3 (three) times daily.  Marland Kitchen loratadine (CLARITIN) 10 MG tablet Take 10 mg by mouth daily.  . metoprolol succinate (TOPROL-XL) 50 MG 24 hr tablet Take 1 tablet (50 mg total) by mouth daily. Take with or immediately following a meal.  . montelukast (SINGULAIR) 10 MG tablet Take 10 mg by mouth at bedtime.  . nitroGLYCERIN (NITROSTAT) 0.4 MG SL tablet Place 0.4 mg under the tongue every 5 (five) minutes as needed for chest pain.  Marland Kitchen nystatin (NYSTATIN) powder Apply to affected area tid prn  . pantoprazole (PROTONIX) 40 MG tablet Take 1 tablet (40 mg total) by mouth daily.  . ranolazine (RANEXA) 500 MG 12 hr tablet Take 2 tablets (1,000 mg total) by mouth 2 (two) times daily.  . rosuvastatin (CRESTOR) 40 MG tablet Take 1 tablet (40 mg total) by mouth daily. (Patient taking differently: Take 20 mg by mouth daily. )  . sacubitril-valsartan (ENTRESTO) 97-103 MG Take 1 tablet by mouth 2 (two) times daily. (Patient taking differently: Take 1 tablet by mouth daily. )  . sitaGLIPtin (JANUVIA) 100 MG tablet Take 1 tablet (100 mg total) by mouth daily.  Marland Kitchen spironolactone (ALDACTONE) 50 MG tablet TAKE 1 TABLET BY MOUTH EVERY DAY  . temazepam (RESTORIL) 15 MG capsule Take 15 mg by mouth at bedtime as needed for sleep.  . traMADol (ULTRAM) 50 MG tablet Take 1 tablet (50 mg total) by mouth every 6 (six) hours as needed.    Allergies  Allergen Reactions  . Lisinopril Cough      Review of Systems negative except from HPI and  PMH  Physical Exam BP (!) 148/82   Pulse 79   Ht 5' (1.524 m)   Wt 141 lb 9.6 oz (64.2 kg)   SpO2 98%   BMI 27.65 kg/m  Well developed and well nourished in no acute distress HENT normal E scleral and icterus clear Neck Supple JVP flat; carotids brisk and full Clear to ausculation Regular rate and rhythm, no murmurs gallops or rub Soft with active bowel sounds No clubbing cyanosis  Edema Alert and oriented, grossly normal motor and sensory function Skin Warm and Dry  ECG sinus @ 79 19/11/40  ECG 8/20  Sinus @ 98 LBBB  CrCl cannot be calculated (Patient's most recent lab result is older than the maximum 21 days allowed.).   Assessment and  Plan  Ischemic cardiomyopathy  Exertional angina-unstable  Congestive heart failure-chronic-systolic/diastolic class III  COPD/reactive airways disease  Hypertension-inadequately controlled    Lengthy discussion regarding the role of device implantation.  She has rate related left bundle branch block.  This is modification for CRT.  We discussed at great length the issue of ICD implantation in the context of ischemic heart disease in an octogenarian population.  The [facility] paper 2018 supports the indication as it applies to someone like this vigorous lady.  We discussed the possibility of complications both procedural and postprocedural.  She has been very physically regarding the latter.  Spoke with both her and her daughter.  They would like to proceed.  Have reviewed the potential benefits and risks of ICD implantation including but not limited to death, perforation of heart or lung, lead dislodgement, infection,  device malfunction and inappropriate shocks.  The patient and family express understanding  and are willing to proceed.    More than 50% of 45 min was spent in counseling related to the above  Current medicines are reviewed at length with the patient today .  The patient does not  have concerns regarding medicines.

## 2019-03-22 ENCOUNTER — Ambulatory Visit (INDEPENDENT_AMBULATORY_CARE_PROVIDER_SITE_OTHER): Payer: Medicare HMO

## 2019-03-22 ENCOUNTER — Other Ambulatory Visit: Payer: Self-pay

## 2019-03-22 DIAGNOSIS — I255 Ischemic cardiomyopathy: Secondary | ICD-10-CM | POA: Diagnosis not present

## 2019-03-28 ENCOUNTER — Ambulatory Visit (INDEPENDENT_AMBULATORY_CARE_PROVIDER_SITE_OTHER): Payer: Medicare HMO | Admitting: Internal Medicine

## 2019-03-28 ENCOUNTER — Other Ambulatory Visit: Payer: Self-pay

## 2019-03-28 ENCOUNTER — Encounter: Payer: Self-pay | Admitting: Internal Medicine

## 2019-03-28 VITALS — BP 148/90 | HR 84 | Temp 98.3°F | Ht 60.0 in | Wt 141.0 lb

## 2019-03-28 DIAGNOSIS — I1310 Hypertensive heart and chronic kidney disease without heart failure, with stage 1 through stage 4 chronic kidney disease, or unspecified chronic kidney disease: Secondary | ICD-10-CM

## 2019-03-28 DIAGNOSIS — R05 Cough: Secondary | ICD-10-CM

## 2019-03-28 DIAGNOSIS — I25119 Atherosclerotic heart disease of native coronary artery with unspecified angina pectoris: Secondary | ICD-10-CM

## 2019-03-28 DIAGNOSIS — E663 Overweight: Secondary | ICD-10-CM | POA: Diagnosis not present

## 2019-03-28 DIAGNOSIS — Z23 Encounter for immunization: Secondary | ICD-10-CM | POA: Diagnosis not present

## 2019-03-28 DIAGNOSIS — N182 Chronic kidney disease, stage 2 (mild): Secondary | ICD-10-CM

## 2019-03-28 DIAGNOSIS — R053 Chronic cough: Secondary | ICD-10-CM

## 2019-03-28 DIAGNOSIS — E1122 Type 2 diabetes mellitus with diabetic chronic kidney disease: Secondary | ICD-10-CM | POA: Diagnosis not present

## 2019-03-28 DIAGNOSIS — Z6827 Body mass index (BMI) 27.0-27.9, adult: Secondary | ICD-10-CM | POA: Diagnosis not present

## 2019-03-28 MED ORDER — HYDROCODONE-HOMATROPINE 5-1.5 MG/5ML PO SYRP: 5.0000 mL | ORAL_SOLUTION | Freq: Four times a day (QID) | ORAL | 0 refills | Status: DC | PRN

## 2019-03-28 MED ORDER — PNEUMOCOCCAL 13-VAL CONJ VACC IM SUSP: 0.5000 mL | INTRAMUSCULAR | 0 refills | Status: AC

## 2019-03-28 NOTE — Patient Instructions (Signed)
Cardioverter Defibrillator Implantation  An implantable cardioverter defibrillator (ICD) is a small device that is placed under the skin in the chest or abdomen. An ICD consists of a battery, a small computer (pulse generator), and wires (leads) that go into the heart. An ICD is used to detect and correct two types of dangerous irregular heartbeats (arrhythmias):  A rapid heart rhythm (tachycardia).  An arrhythmia in which the lower chambers of the heart (ventricles) contract in an uncoordinated way (fibrillation). When an ICD detects tachycardia, it sends a low-energy shock to the heart to restore the heartbeat to normal (cardioversion). This signal is usually painless. If cardioversion does not work or if the ICD detects fibrillation, it delivers a high-energy shock to the heart (defibrillation) to restart the heart. This shock may feel like a strong jolt in the chest. Your health care provider may prescribe an ICD if:  You have had an arrhythmia that originated in the ventricles.  Your heart has been damaged by a disease or heart condition. Sometimes, ICDs are programmed to act as a device called a pacemaker. Pacemakers can be used to treat a slow heartbeat (bradycardia) or tachycardia by taking over the heart rate with electrical impulses. Tell a health care provider about:  Any allergies you have.  All medicines you are taking, including vitamins, herbs, eye drops, creams, and over-the-counter medicines.  Any problems you or family members have had with anesthetic medicines.  Any blood disorders you have.  Any surgeries you have had.  Any medical conditions you have.  Whether you are pregnant or may be pregnant. What are the risks? Generally, this is a safe procedure. However, problems may occur, including:  Swelling, bleeding, or bruising.  Infection.  Blood clots.  Damage to other structures or organs, such as nerves, blood vessels, or the heart.  Allergic reactions to  medicines used during the procedure. What happens before the procedure? Staying hydrated Follow instructions from your health care provider about hydration, which may include:  Up to 2 hours before the procedure - you may continue to drink clear liquids, such as water, clear fruit juice, black coffee, and plain tea. Eating and drinking restrictions Follow instructions from your health care provider about eating and drinking, which may include:  8 hours before the procedure - stop eating heavy meals or foods such as meat, fried foods, or fatty foods.  6 hours before the procedure - stop eating light meals or foods, such as toast or cereal.  6 hours before the procedure - stop drinking milk or drinks that contain milk.  2 hours before the procedure - stop drinking clear liquids. Medicine Ask your health care provider about:  Changing or stopping your normal medicines. This is important if you take diabetes medicines or blood thinners.  Taking medicines such as aspirin and ibuprofen. These medicines can thin your blood. Do not take these medicines before your procedure if your doctor tells you not to. Tests  You may have blood tests.  You may have a test to check the electrical signals in your heart (electrocardiogram, ECG).  You may have imaging tests, such as a chest X-ray. General instructions  For 24 hours before the procedure, stop using products that contain nicotine or tobacco, such as cigarettes and e-cigarettes. If you need help quitting, ask your health care provider.  Plan to have someone take you home from the hospital or clinic.  You may be asked to shower with a germ-killing soap. What happens during the procedure?  To reduce your risk of infection: ? Your health care team will wash or sanitize their hands. ? Your skin will be washed with soap. ? Hair may be removed from the surgical area.  Small monitors will be put on your body. They will be used to check your  heart, blood pressure, and oxygen level.  An IV tube will be inserted into one of your veins.  You will be given one or more of the following: ? A medicine to help you relax (sedative). ? A medicine to numb the area (local anesthetic). ? A medicine to make you fall asleep (general anesthetic).  Leads will be guided through a blood vessel into your heart and attached to your heart muscles. Depending on the ICD, the leads may go into one ventricle or they may go into both ventricles and into an upper chamber of the heart. An X-ray machine (fluoroscope) will be usedto help guide the leads.  A small incision will be made to create a deep pocket under your skin.  The pulse generator will be placed into the pocket.  The ICD will be tested.  The incision will be closed with stitches (sutures), skin glue, or staples.  A bandage (dressing) will be placed over the incision. This procedure may vary among health care providers and hospitals. What happens after the procedure?  Your blood pressure, heart rate, breathing rate, and blood oxygen level will be monitored often until the medicines you were given have worn off.  A chest X-ray will be taken to check that the ICD is in the right place.  You will need to stay in the hospital for 1-2 days so your health care provider can make sure your ICD is working.  Do not drive for 24 hours if you received a sedative. Ask your health care provider when it is safe for you to drive.  You may be given an identification card explaining that you have an ICD. Summary  An implantable cardioverter defibrillator (ICD) is a small device that is placed under the skin in the chest or abdomen. It is used to detect and correct dangerous irregular heartbeats (arrhythmias).  An ICD consists of a battery, a small computer (pulse generator), and wires (leads) that go into the heart.  When an ICD detects rapid heart rhythm (tachycardia), it sends a low-energy shock to  the heart to restore the heartbeat to normal (cardioversion). If cardioversion does not work or if the ICD detects uncoordinated heart contractions (fibrillation), it delivers a high-energy shock to the heart (defibrillation) to restart the heart.  You will need to stay in the hospital for 1-2 days to make sure your ICD is working. This information is not intended to replace advice given to you by your health care provider. Make sure you discuss any questions you have with your health care provider. Document Released: 12/13/2001 Document Revised: 03/05/2017 Document Reviewed: 04/01/2016 Elsevier Patient Education  2020 [employer].

## 2019-03-28 NOTE — Progress Notes (Signed)
This visit occurred during the SARS-CoV-2 public health emergency.  Safety protocols were in place, including screening questions prior to the visit, additional usage of staff PPE, and extensive cleaning of exam room while observing appropriate contact time as indicated for disinfecting solutions.  Subjective:     Patient ID: Lynn Carey , female    DOB: 09-10-1940 , 78 y.o.   MRN: [id number]   Chief Complaint  Patient presents with  . Diabetes  . Immunizations    pneumonia / Tdap    HPI  She is here today for DM/HTN check. She reports compliance with meds.   Diabetes She presents for her follow-up diabetic visit. She has type 2 diabetes mellitus. Her disease course has been stable. There are no hypoglycemic associated symptoms. Pertinent negatives for diabetes include no blurred vision and no chest pain. There are no hypoglycemic complications. Diabetic complications include heart disease and nephropathy. Risk factors for coronary artery disease include diabetes mellitus, dyslipidemia, hypertension, post-menopausal, sedentary lifestyle and obesity. She is compliant with treatment all of the time. She is following a diabetic diet. She participates in exercise intermittently. Her breakfast blood glucose is taken between 8-9 am. Her breakfast blood glucose range is generally 90-110 mg/dl. An ACE inhibitor/angiotensin II receptor blocker is being taken. She does not see a podiatrist.Eye exam is current.  Hypertension This is a chronic problem. The current episode started more than 1 year ago. The problem has been rapidly improving since onset. The problem is controlled. Pertinent negatives include no blurred vision, chest pain, palpitations or shortness of breath.     Past Medical History:  Diagnosis Date  . Anginal pain ([place])    h/o, denies today   . Arthritis    hip - both, shot in L shoulder- 2 weeks ago  . Asthma   . Carotid artery occlusion   . CHF (congestive heart failure)  ([place])   . Complication of anesthesia   . Coronary artery disease   . Diabetes mellitus without complication ([place])   . GERD (gastroesophageal reflux disease)   . Hypertension    followed by Dr. Einar Gip  . MYOCARDIAL INFARCTION 05/25/2007   Qualifier: History of  By: Milana Obey, Doroteo Bradford    . Myocardial infarction ([place]) 1997  . PONV (postoperative nausea and vomiting)   . Shortness of breath      Family History  Problem Relation Age of Onset  . Allergies Mother   . Asthma Mother   . Heart disease Mother   . Heart attack Mother   . Allergies Son   . Asthma Son   . Heart disease Father   . Heart attack Father   . Breast cancer Daughter 65     Current Outpatient Medications:  .  ACETAMINOPHEN PO, Take 650 mg by mouth every 6 (six) hours as needed for moderate pain or headache. , Disp: , Rfl:  .  albuterol (VENTOLIN HFA) 108 (90 Base) MCG/ACT inhaler, Inhale 2 puffs into the lungs every 4 (four) hours as needed for wheezing or shortness of breath., Disp: 6.7 g, Rfl: 3 .  amLODipine (NORVASC) 2.5 MG tablet, Take 1 tablet (2.5 mg total) by mouth daily., Disp: 30 tablet, Rfl: 5 .  aspirin EC 81 MG tablet, Take 1 tablet (81 mg total) by mouth daily., Disp: 90 tablet, Rfl: 3 .  budesonide-formoterol (SYMBICORT) 160-4.5 MCG/ACT inhaler, Inhale 2 puffs into the lungs every 4 (four) hours as needed (wheezing)., Disp: , Rfl:  .  cetirizine (ZYRTEC) 10  MG tablet, Take 10 mg by mouth at bedtime., Disp: , Rfl:  .  Cholecalciferol (VITAMIN D) 2000 UNITS CAPS, Take 2,000 Units by mouth daily. , Disp: , Rfl:  .  clopidogrel (PLAVIX) 75 MG tablet, Take 1 tablet (75 mg total) by mouth daily., Disp: 90 tablet, Rfl: 2 .  dorzolamide-timolol (COSOPT) 22.3-6.8 MG/ML ophthalmic solution, Place 1 drop into both eyes 3 (three) times daily., Disp: , Rfl:  .  ezetimibe (ZETIA) 10 MG tablet, Take 1 tablet (10 mg total) by mouth daily., Disp: 90 tablet, Rfl: 2 .  furosemide (LASIX) 40 MG tablet, Take 1 tablet (40 mg  total) by mouth daily., Disp: 90 tablet, Rfl: 3 .  isosorbide-hydrALAZINE (BIDIL) 20-37.5 MG tablet, Take 2 tablets by mouth 3 (three) times daily., Disp: 15 tablet, Rfl: 0 .  loratadine (CLARITIN) 10 MG tablet, Take 10 mg by mouth daily., Disp: , Rfl:  .  metoprolol succinate (TOPROL-XL) 50 MG 24 hr tablet, Take 1 tablet (50 mg total) by mouth daily. Take with or immediately following a meal., Disp: 30 tablet, Rfl: 2 .  montelukast (SINGULAIR) 10 MG tablet, Take 10 mg by mouth at bedtime., Disp: , Rfl:  .  nitroGLYCERIN (NITROSTAT) 0.4 MG SL tablet, Place 0.4 mg under the tongue every 5 (five) minutes as needed for chest pain., Disp: , Rfl:  .  nystatin (NYSTATIN) powder, Apply to affected area tid prn, Disp: 60 g, Rfl: 0 .  pantoprazole (PROTONIX) 40 MG tablet, Take 1 tablet (40 mg total) by mouth daily., Disp: 90 tablet, Rfl: 1 .  ranolazine (RANEXA) 500 MG 12 hr tablet, Take 2 tablets (1,000 mg total) by mouth 2 (two) times daily., Disp: 60 tablet, Rfl: 6 .  rosuvastatin (CRESTOR) 40 MG tablet, Take 1 tablet (40 mg total) by mouth daily. (Patient taking differently: Take 20 mg by mouth daily. ), Disp: 90 tablet, Rfl: 2 .  sacubitril-valsartan (ENTRESTO) 97-103 MG, Take 1 tablet by mouth 2 (two) times daily. (Patient taking differently: Take 1 tablet by mouth daily. ), Disp: 120 tablet, Rfl: 3 .  sitaGLIPtin (JANUVIA) 100 MG tablet, Take 1 tablet (100 mg total) by mouth daily., Disp: 90 tablet, Rfl: 3 .  spironolactone (ALDACTONE) 50 MG tablet, TAKE 1 TABLET BY MOUTH EVERY DAY, Disp: 90 tablet, Rfl: 2 .  temazepam (RESTORIL) 15 MG capsule, Take 15 mg by mouth at bedtime as needed for sleep., Disp: , Rfl: 1 .  traMADol (ULTRAM) 50 MG tablet, Take 1 tablet (50 mg total) by mouth every 6 (six) hours as needed., Disp: 20 tablet, Rfl: 0 .  HYDROcodone-homatropine (HYDROMET) 5-1.5 MG/5ML syrup, Take 5 mLs by mouth every 6 (six) hours as needed. (Patient not taking: Reported on 03/28/2019), Disp: 120 mL,  Rfl: 0 .  pneumococcal 13-valent conjugate vaccine (PREVNAR 13) SUSP injection, Inject 0.5 mLs into the muscle tomorrow at 10 am for 1 dose., Disp: 0.5 mL, Rfl: 0   Allergies  Allergen Reactions  . Lisinopril Cough     Review of Systems  Constitutional: Negative.   Eyes: Negative for blurred vision.  Respiratory: Negative.  Negative for shortness of breath.   Cardiovascular: Negative.  Negative for chest pain and palpitations.  Gastrointestinal: Negative.   Neurological: Negative.   Psychiatric/Behavioral: Negative.      Today's Vitals   03/28/19 1136  BP: (!) 148/90  Pulse: 84  Temp: 98.3 F (36.8 C)  TempSrc: Oral  SpO2: 98%  Weight: 141 lb (64 kg)  Height: 5' (1.524  m)  PainSc: 0-No pain   Body mass index is 27.54 kg/m.   Objective:  Physical Exam Vitals and nursing note reviewed.  Constitutional:      Appearance: Normal appearance.  HENT:     Head: Normocephalic and atraumatic.  Cardiovascular:     Rate and Rhythm: Normal rate and regular rhythm.     Heart sounds: Normal heart sounds.  Pulmonary:     Effort: Pulmonary effort is normal.     Breath sounds: Normal breath sounds.  Skin:    General: Skin is warm.  Neurological:     General: No focal deficit present.     Mental Status: She is alert.  Psychiatric:        Mood and Affect: Mood normal.        Behavior: Behavior normal.         Assessment And Plan:    1. Type 2 diabetes mellitus with stage 2 chronic kidney disease, without long-term current use of insulin (HCC)  Chronic, yet stable. I will check an a1c. She was commended on her lifestyle changes and encouraged to keep up the great work. She will rto in 3-4 months for re-evaluation.   - Hemoglobin A1c  2. Benign hypertensive heart and renal disease  Chronic, fair control.  She reports being "aggravated" just prior to her appt. She will continue with current meds for now.   3. Atherosclerosis of native coronary artery of native heart with  angina pectoris (HCC)  Chronic, yet stable.   4. Chronic cough  Chronic, yet stable. She was given refill of hydromet syrup. Review of the [place] CSRS was performed in accordance of the [place] prior to dispensing any controlled drugs.   5. Immunization due  She was given rx Prevnar-13 to update her immunization history.   6. Overweight with body mass index (BMI) of 27 to 27.9 in adult  Her weight has improved and she has maintained her weight loss. A BMI of 27 or less is great for her. She is encouraged to increase her daily activity as tolerated.     Maximino Greenland, MD    THE PATIENT IS ENCOURAGED TO PRACTICE SOCIAL DISTANCING DUE TO THE COVID-19 PANDEMIC.

## 2019-03-29 ENCOUNTER — Telehealth: Payer: Self-pay

## 2019-03-29 DIAGNOSIS — I255 Ischemic cardiomyopathy: Secondary | ICD-10-CM

## 2019-03-29 DIAGNOSIS — I5022 Chronic systolic (congestive) heart failure: Secondary | ICD-10-CM

## 2019-03-29 LAB — HEMOGLOBIN A1C
Est. average glucose Bld gHb Est-mCnc: 105 mg/dL
Hgb A1c MFr Bld: 5.3 % (ref 4.8–5.6)

## 2019-03-29 NOTE — Telephone Encounter (Signed)
Phone call from pt states she wishes to schedule ICD implant for 04/21/2019 at 930am.  Orders placed and instructions provided to pt.  Pt was given surgical scrub and instruction sheet during OV.  Pt verbalizes understanding and agrees with plan.

## 2019-04-01 NOTE — Telephone Encounter (Signed)
M  mostly I just figure it out at the hospital-- no need to be concerned about vendors, unless something specific-- Thanks SK

## 2019-04-03 ENCOUNTER — Other Ambulatory Visit: Payer: Self-pay

## 2019-04-03 ENCOUNTER — Ambulatory Visit (INDEPENDENT_AMBULATORY_CARE_PROVIDER_SITE_OTHER): Payer: Medicare HMO

## 2019-04-03 ENCOUNTER — Ambulatory Visit: Payer: Medicare HMO | Admitting: Internal Medicine

## 2019-04-03 ENCOUNTER — Encounter: Payer: Self-pay | Admitting: Internal Medicine

## 2019-04-03 DIAGNOSIS — R05 Cough: Secondary | ICD-10-CM

## 2019-04-03 DIAGNOSIS — R053 Chronic cough: Secondary | ICD-10-CM

## 2019-04-03 MED ORDER — GABAPENTIN 100 MG PO CAPS: 100.0000 mg | ORAL_CAPSULE | Freq: Three times a day (TID) | ORAL | 2 refills | Status: DC

## 2019-04-03 MED ORDER — FAMOTIDINE 20 MG PO TABS: ORAL_TABLET | ORAL | 11 refills | Status: DC

## 2019-04-03 NOTE — Patient Instructions (Addendum)
Pantoprazole (protonix) 40 mg   Take  30-60 min before first meal of the day and Pepcid (famotidine)  20 mg one hour before bedtime  until return to office - this is the best way to tell whether stomach acid is contributing to your problem.    Gabapentin 100 mg three times a day   For drainage / throat tickle stop clariton and zyrtec and  try take CHLORPHENIRAMINE  4 mg  (Chlortab 4mg   at [employer] should be easiest to find in the green box)  take one every 4 hours as needed - available over the counter- may cause drowsiness so start with just a bedtime dose or two and see how you tolerate it before trying in daytime    GERD (REFLUX)  is an extremely common cause of respiratory symptoms just like yours , many times with no obvious heartburn at all.    It can be treated with medication, but also with lifestyle changes including elevation of the head of your bed (ideally with 6 -8inch blocks under the headboard of your bed),  Smoking cessation, avoidance of late meals, excessive alcohol, and avoid fatty foods, chocolate, peppermint, colas, red wine, and acidic juices such as orange juice.  NO MINT OR MENTHOL PRODUCTS SO NO COUGH DROPS  USE SUGARLESS CANDY INSTEAD (Jolley ranchers or Stover's or Life Savers) or even ice chips will also do - the key is to swallow to prevent all throat clearing. NO OIL BASED VITAMINS - use powdered substitutes.  Avoid fish oil when coughing.  Please remember to go to the  x-ray department  for your tests - we will call you with the results when they are available     Please schedule a follow up office visit in 4 weeks, call sooner if needed with all medications /inhalers/ solutions in hand so we can verify exactly what you are taking. This includes all medications from all doctors and over the [place] separate them into two bags:  the ones you take automatically, no matter what, vs the ones you take just when you feel you need them "BAG #2 is UP TO YOU"   - this will really help [place] help you take your medications more effectively.

## 2019-04-03 NOTE — Progress Notes (Signed)
Subjective:    Patient ID: Lynn Carey, female    DOB: 1940/05/29   MRN: [id number]  Brief patient profile:  7   yobf quit smoking around 1985 with onset "asthma attacks"  Which resolved p quit  did not need medications until mid 90's then needed "medications for breathing" rx  per Dr  Kelton Pillar then onset of cough and worse breathing started summer 2014 under Bryon Lions > referred 05/26/13 to pulmonary clinic for cough with nl lung function documented 07/27/2013    History of Present Illness  05/26/2013 1st [facility] office visit/ Lynn Carey  Chief Complaint  Patient presents with  . Advice Only    Referred by Dr Redmond Baseman for chronic cough X8 mos.  Pt c/o sometimes prod cough with clear mucous, made worse by eating.    [facility] eval and rx for GERD then referred to Dr Redmond Baseman Only improved on hydromet  Cough only when eat and lie down  ? Better with inhalers, already on dulera 100 2bid no sign purulent sputum or even much volume No sob over baseline rec Stop tribenzor and lopressor Start benicar [id number] and bystolic 10 mg daily  Continue Nexium 40 mg Take 30- 60 min before your first and last meals of the day  Continue dulera 100 Take 2 puffs first thing in am and then another 2 puffs about 12 hours later   GERD  Diet      06/15/2013 f/u ov/Lynn Carey re: cough since summer 2014/ brought meds in a pill organizer, unlabeled, no saba Chief Complaint  Patient presents with  . Follow-up    Pt reports cough is about 50% better since last visit. No new co's today.   cough all day long but no longer at night and not using cough suppression at all. Constant sensation she needs to clear her throat Not limited by breathing from desired activities   >>Continue nexium Take 30- 60 min before your first and last meals of the day automatically For drainage > use sugarless candy and chlortrimeton 4 mg every 4 hours as needed (over the counter) Stop dulera for now and just use ventolin every 4  hours as needed   06/23/2013 Follow up and Med review Patient returns for followup and medication review. We reviewed all her medications and organized them into a medication calendar with patient education It appears the patient is taking her medications Last visit. Patient was taken off of Dulera due to cough .  Since last visit. Patient is feeling some better. No flare of cough or wheezing off Dulera .  No increased SABA use. No use for last 1 week.  rec No change rx > follow med calendar   07/27/2013 f/u ov/Lynn Carey re: chronic cough with nl pfts/ no longer on any inhalers at all/ no better or worse rec depomedrol 120 mg today Completely eliminate all coughing x 3 straight days  Gabapentin 100 mg three times daily  Please schedule a follow up office visit in 2 weeks, sooner if needed  Will start back on bystolic 10 mg daily samples for 2 weeks then regroup    08/09/2013 f/u ov/Lynn Carey re: pseudoasthma / now on neurontin 100 tid  Chief Complaint  Patient presents with  . Follow-up    Pt states cough is better compared to last visit. She is using her rescue inhaler at least once per day.   Not using med calendar well, not updated by various providers at time of ov so it's not  correct anyway rec The only change I recommend is to reduce the benicar to one half daily    Outpatient Encounter Prescriptions as of 08/09/2013  Medication Sig  . acetaminophen (TYLENOL ARTHRITIS PAIN) 650 MG CR tablet 2 tabs by mouth every 8 hours as needed for pain  . albuterol (PROVENTIL HFA;VENTOLIN HFA) 108 (90 BASE) MCG/ACT inhaler Inhale 2 puffs into the lungs every 4 (four) hours as needed for shortness of breath.   Marland Kitchen aspirin 81 MG tablet 2 tabs by mouth once daily  . chlorpheniramine (CHLOR-TRIMETON) 4 MG tablet Take 4 mg by mouth every 4 (four) hours as needed (drippy nose, drainage, throat clearing).  . cholecalciferol (VITAMIN D) 1000 UNITS tablet Take 2,000 Units by mouth daily.   Marland Kitchen dextromethorphan  (DELSYM) 30 MG/5ML liquid 2 tsp every 12 hours as needed for cough  . esomeprazole (NEXIUM) 40 MG capsule Take 40 mg by mouth 2 (two) times daily.  Marland Kitchen ezetimibe (ZETIA) 10 MG tablet Take 10 mg by mouth at bedtime.   . gabapentin (NEURONTIN) 100 MG capsule Take 1 capsule (100 mg total) by mouth 3 (three) times daily. One three times daily  . HYDROcodone-acetaminophen (NORCO/VICODIN) 5-325 MG per tablet Take 1-2 tablets by mouth every 4 (four) hours as needed.  Marland Kitchen ibuprofen (ADVIL,MOTRIN) 200 MG tablet Per bottle as needed for joint pain  . isosorbide-hydrALAZINE (BIDIL) 20-37.5 MG per tablet Take 1 tablet by mouth 3 (three) times daily.  . nebivolol (BYSTOLIC) 10 MG tablet Take 1 tablet (10 mg total) by mouth daily.  . nitroGLYCERIN (NITROSTAT) 0.4 MG SL tablet Place 0.4 mg under the tongue every 5 (five) minutes as needed. For chest pain    . olmesartan-hydrochlorothiazide (BENICAR HCT) 40-25 MG per tablet Take 1 tablet by mouth daily.  . ranolazine (RANEXA) 500 MG 12 hr tablet Take 500 mg by mouth 2 (two) times daily.  . rosuvastatin (CRESTOR) 20 MG tablet Take 20 mg by mouth at bedtime.   . sitaGLIPtin (JANUVIA) 100 MG tablet Take 100 mg by mouth daily.        04/03/2019  Re-establish ov/Lynn Carey re:  "cough never got better, then worse x 2 years (note last 3 visits progressive improvement off asthma rx, now back on it plus entresto since 09/2018  Chief Complaint  Patient presents with  . Pulmonary Consult    Referred by Dr Virgina Jock. She c/o cough for at least the past 10 years, worse x 2 years. She states she notices cough more when she changes atmospheres.   Dyspnea:  MMRC1 = can walk nl pace, flat grade, can't hurry or go uphills or steps s sob   Cough: worse with temperature change/ voice use / dry  Sleeping: not coughing unless temp changes  But it will sometimes wake her up / bed is flat with 3 pillows SABA use: not using symbicort unless she has to / not  02: none   No obvious day  to day or daytime variability or assoc excess/ purulent sputum or mucus plugs or hemoptysis or cp or chest tightness, subjective wheeze or overt sinus or hb symptoms.    Also denies any obvious fluctuation of symptoms with non-temperature related environmental changes or other aggravating or alleviating factors except as outlined above   No unusual exposure hx or h/o childhood pna/ asthma or knowledge of premature birth.  Current Allergies, Complete Past Medical History, Past Surgical History, Family History, and Social History were reviewed in [employer] record.  ROS  The following are not active complaints unless bolded Hoarseness, sore throat, dysphagia, dental problems, itching, sneezing,  nasal congestion or discharge of excess mucus or purulent secretions, ear ache,   fever, chills, sweats, unintended wt loss or wt gain, classically pleuritic or exertional cp,  orthopnea pnd or arm/hand swelling  or leg swelling, presyncope, palpitations, abdominal pain, anorexia, nausea, vomiting, diarrhea  or change in bowel habits or change in bladder habits, change in stools or change in urine, dysuria, hematuria,  rash, arthralgias, visual complaints, headache, numbness, weakness or ataxia or problems with walking or coordination,  change in mood or  memory.        Current Meds  Medication Sig  . ACETAMINOPHEN PO Take 650 mg by mouth every 6 (six) hours as needed for moderate pain or headache.   . albuterol (VENTOLIN HFA) 108 (90 Base) MCG/ACT inhaler Inhale 2 puffs into the lungs every 4 (four) hours as needed for wheezing or shortness of breath.  Marland Kitchen amLODipine (NORVASC) 2.5 MG tablet Take 1 tablet (2.5 mg total) by mouth daily.  Marland Kitchen aspirin EC 81 MG tablet Take 1 tablet (81 mg total) by mouth daily.  . budesonide-formoterol (SYMBICORT) 160-4.5 MCG/ACT inhaler Inhale 2 puffs into the lungs every 4 (four) hours as needed (wheezing).  . cetirizine (ZYRTEC) 10 MG tablet Take 10 mg by  mouth at bedtime.  . Cholecalciferol (VITAMIN D) 2000 UNITS CAPS Take 2,000 Units by mouth daily.   . clopidogrel (PLAVIX) 75 MG tablet Take 1 tablet (75 mg total) by mouth daily.  . dorzolamide-timolol (COSOPT) 22.3-6.8 MG/ML ophthalmic solution Place 1 drop into both eyes 3 (three) times daily.  Marland Kitchen ezetimibe (ZETIA) 10 MG tablet Take 1 tablet (10 mg total) by mouth daily.  . furosemide (LASIX) 40 MG tablet Take 1 tablet (40 mg total) by mouth daily.  Marland Kitchen HYDROcodone-homatropine (HYDROMET) 5-1.5 MG/5ML syrup Take 5 mLs by mouth every 6 (six) hours as needed.  . isosorbide-hydrALAZINE (BIDIL) 20-37.5 MG tablet Take 2 tablets by mouth 3 (three) times daily.  Marland Kitchen loratadine (CLARITIN) 10 MG tablet Take 10 mg by mouth daily.  . metoprolol succinate (TOPROL-XL) 50 MG 24 hr tablet Take 1 tablet (50 mg total) by mouth daily. Take with or immediately following a meal.  . montelukast (SINGULAIR) 10 MG tablet Take 10 mg by mouth at bedtime.  . nitroGLYCERIN (NITROSTAT) 0.4 MG SL tablet Place 0.4 mg under the tongue every 5 (five) minutes as needed for chest pain.  Marland Kitchen nystatin (NYSTATIN) powder Apply to affected area tid prn  . pantoprazole (PROTONIX) 40 MG tablet Take 1 tablet (40 mg total) by mouth daily.  . ranolazine (RANEXA) 500 MG 12 hr tablet Take 2 tablets (1,000 mg total) by mouth 2 (two) times daily.  . rosuvastatin (CRESTOR) 40 MG tablet Take 1 tablet (40 mg total) by mouth daily. (Patient taking differently: Take 20 mg by mouth daily. )  . sacubitril-valsartan (ENTRESTO) 97-103 MG Take 1 tablet by mouth 2 (two) times daily. (Patient taking differently: Take 1 tablet by mouth daily. )  . sitaGLIPtin (JANUVIA) 100 MG tablet Take 1 tablet (100 mg total) by mouth daily.  Marland Kitchen spironolactone (ALDACTONE) 50 MG tablet TAKE 1 TABLET BY MOUTH EVERY DAY  . temazepam (RESTORIL) 15 MG capsule Take 15 mg by mouth at bedtime as needed for sleep.  . traMADol (ULTRAM) 50 MG tablet Take 1 tablet (50 mg total) by mouth  every 6 (six) hours as needed.  Objective:   Physical Exam   04/03/2019       140  08/09/13              150 06/15/2013  165  >06/23/2013 >163 06/23/2013 > 07/27/2013  157 >    slt hoarse amb bf nad  classic voice fatigue/ easily confused with details of car   Vital signs reviewed - Note on arrival 02 sats  98% on RA      HEENT : pt wearing mask not removed for exam due to covid -19 concerns.    NECK :  without JVD/Nodes/TM/ nl carotid upstrokes bilaterally   LUNGS: no acc muscle use,  Nl contour chest which is clear to A and P bilaterally without cough on insp or exp maneuvers   CV:  RRR  no s3 or murmur or increase in P2, and no edema   ABD:  soft and nontender with nl inspiratory excursion in the supine position. No bruits or organomegaly appreciated, bowel sounds nl  MS:  Nl gait/ ext warm without deformities, calf tenderness, cyanosis or clubbing No obvious joint restrictions   SKIN: warm and dry without lesions    NEURO:  alert, approp, nl sensorium with  no motor or cerebellar deficits apparent.        02/21/2013  1. No evidence of Zenker's diverticulum.  2. Nonspecific esophageal motility disorder (mild).  3. Small sliding-type hiatal hernia.  4. Mild gastroesophageal reflux identified during the water siphon  test.    CXR PA and Lateral:   04/03/2019 :    I personally reviewed images and   impression as follows:   Mod CM s chf         Assessment & Plan:

## 2019-04-03 NOTE — Assessment & Plan Note (Addendum)
Onset around  2014 / ACEi intolerant  Dg Es 04/23/2012   Nonspecific esophageal motility disorder (mild).   Small sliding-type hiatal hernia. With Mild gastroesophageal reflux identified during the water siphon  test. - trial of neurontin 07/28/2013 > improved 08/09/13 so continue > pt d/c'd > restarted 04/03/2019   Lack of resp to symbicort or worse off it with h/o ACEi and gerd are classic for Upper airway cough syndrome (previously labeled PNDS),  is so named because it's frequently impossible to sort out how much is  CR/sinusitis with freq throat clearing (which can be related to primary GERD)   vs  causing  secondary (" extra esophageal")  GERD from wide swings in gastric pressure that occur with throat clearing, often  promoting self use of mint and menthol lozenges that reduce the lower esophageal sphincter tone and exacerbate the problem further in a cyclical fashion.   These are the same pts (now being labeled as having "irritable larynx syndrome" by some cough centers) who not infrequently have a history of having failed to tolerate ace inhibitors(and entresto assoc cough is 9% in the PI but note this was added more recent than cough flare) ,  dry powder inhalers or biphosphonates or report having atypical/extraesophageal reflux symptoms that don't respond to standard doses of PPI  and are easily confused as having aecopd or asthma flares by even experienced allergists/ pulmonologists (myself included).   Of the three most common causes of  Sub-acute / recurrent or chronic cough, only one (GERD)  can actually contribute to/ trigger  the other two (asthma and post nasal drip syndrome)  and perpetuate the cylce of cough.  While not intuitively obvious, many patients with chronic low grade reflux do not cough until there is a primary insult that disturbs the protective epithelial barrier and exposes sensitive nerve endings.   This is typically viral but can due to PNDS and  either may apply here.    The point is that once this occurs, it is difficult to eliminate the cycle  using anything but a maximally effective acid suppression regimen at least in the short run, accompanied by an appropriate diet to address non acid GERD and control / eliminate the cough itself with gabapentin starting at 100 mg tid and pushing to 300 qid if tol plus eliminate pnds with 1st gen H1 blockers per guidelines     Advised: The standardized cough guidelines published in Chest by Lissa Morales in 2006 are still the best available and consist of a multiple step process (up to 12!) , not a single office visit,  and are intended  to address this problem logically,  with an alogrithm dependent on response to empiric treatment at  each progressive step  to determine a specific diagnosis with  minimal addtional testing needed. Therefore if adherence is an issue or can't be accurately verified,  it's very unlikely the standard evaluation and treatment will be successful here.    Furthermore, response to therapy (other than acute cough suppression, which should only be used short term with avoidance of narcotic containing cough syrups if possible), can be a gradual process for which the patient is not likely to  perceive immediate benefit.  Unlike going to an eye doctor where the best perscription is almost always the first one and is immediately effective, this is almost never the case in the management of chronic cough syndromes. Therefore the patient needs to commit up front to consistently adhere to recommendations  for  up to 6 weeks of therapy directed at the likely underlying problem(s) before the response can be reasonably evaluated.   >>> f/u in 4 weeks with all meds in hand using a trust but verify approach to confirm accurate Medication  Reconciliation The principal here is that until we are certain that the  patients are doing what we've asked, it makes no sense to ask them to do more.    Total time devoted to  counseling  > 50 % of initial 60 min office visit:  review case with pt/ discussion of options/alternatives/ personally creating written customized instructions  in presence of pt  then going over those specific  Instructions directly with the pt including how to use all of the meds but in particular covering each new medication in detail and the difference between the maintenance= "automatic" meds and the prns using an action plan format for the latter (If this problem/symptom => do that organization reading Left to right).  Please see AVS from this visit for a full list of these instructions which I personally wrote for this pt and  are unique to this visit.

## 2019-04-04 NOTE — Progress Notes (Signed)
Spoke with pt and notified of results per Dr. Wert. Pt verbalized understanding and denied any questions. 

## 2019-04-06 ENCOUNTER — Ambulatory Visit: Payer: Medicare HMO | Admitting: Cardiovascular Disease

## 2019-04-17 ENCOUNTER — Telehealth: Payer: Self-pay

## 2019-04-17 NOTE — Telephone Encounter (Signed)
Attempted phone call tp pt.  No answer and voicemail full.  Unable to leave message.

## 2019-04-18 ENCOUNTER — Other Ambulatory Visit: Payer: Medicare HMO

## 2019-04-18 ENCOUNTER — Other Ambulatory Visit: Payer: Self-pay

## 2019-04-18 ENCOUNTER — Other Ambulatory Visit (HOSPITAL_COMMUNITY)
Admission: RE | Admit: 2019-04-18 | Discharge: 2019-04-18 | Disposition: A | Discharge status: Home / Self Care | Payer: Medicare HMO | Source: Ambulatory Visit | Attending: Internal Medicine | Admitting: Internal Medicine

## 2019-04-18 DIAGNOSIS — Z20822 Contact with and (suspected) exposure to covid-19: Secondary | ICD-10-CM | POA: Insufficient documentation

## 2019-04-18 DIAGNOSIS — Z01812 Encounter for preprocedural laboratory examination: Secondary | ICD-10-CM | POA: Insufficient documentation

## 2019-04-18 DIAGNOSIS — I255 Ischemic cardiomyopathy: Secondary | ICD-10-CM

## 2019-04-18 DIAGNOSIS — I5022 Chronic systolic (congestive) heart failure: Secondary | ICD-10-CM

## 2019-04-18 LAB — BASIC METABOLIC PANEL
BUN/Creatinine Ratio: 17 (ref 12–28)
BUN: 22 mg/dL (ref 8–27)
CO2: 24 mmol/L (ref 20–29)
Calcium: 9.8 mg/dL (ref 8.7–10.3)
Chloride: 103 mmol/L (ref 96–106)
Creatinine, Ser: 1.27 mg/dL — ABNORMAL HIGH (ref 0.57–1.00)
GFR calc Af Amer: 47 mL/min/{1.73_m2} — ABNORMAL LOW (ref >59)
GFR calc non Af Amer: 41 mL/min/{1.73_m2} — ABNORMAL LOW (ref >59)
Glucose: 110 mg/dL — ABNORMAL HIGH (ref 65–99)
Potassium: 4.9 mmol/L (ref 3.5–5.2)
Sodium: 142 mmol/L (ref 134–144)

## 2019-04-18 LAB — CBC
Hematocrit: 36.9 % (ref 34.0–46.6)
Hemoglobin: 12.5 g/dL (ref 11.1–15.9)
MCH: 31.8 pg (ref 26.6–33.0)
MCHC: 33.9 g/dL (ref 31.5–35.7)
MCV: 94 fL (ref 79–97)
Platelets: 280 10*3/uL (ref 150–450)
RBC: 3.93 x10E6/uL (ref 3.77–5.28)
RDW: 11.9 % (ref 11.7–15.4)
WBC: 6.2 10*3/uL (ref 3.4–10.8)

## 2019-04-18 NOTE — Telephone Encounter (Signed)
Pt notified of schedule change for procedure on 04/21/2019.  Pt advised she will now need to report to hospital at 930am for procedure at 1130am with Dr Caryl Comes.  Pt verbalizes understanding and agrees with plan.

## 2019-04-19 LAB — NOVEL CORONAVIRUS, NAA (HOSP ORDER, SEND-OUT TO REF LAB; TAT 18-24 HRS): SARS-CoV-2, NAA: NOT DETECTED

## 2019-04-21 ENCOUNTER — Encounter (HOSPITAL_COMMUNITY)
Admission: RE | Disposition: A | Discharge status: Home / Self Care | Payer: Medicare HMO | Source: Home / Self Care | Attending: Internal Medicine

## 2019-04-21 ENCOUNTER — Ambulatory Visit (HOSPITAL_COMMUNITY): Payer: Medicare HMO

## 2019-04-21 ENCOUNTER — Other Ambulatory Visit: Payer: Self-pay

## 2019-04-21 ENCOUNTER — Ambulatory Visit (HOSPITAL_COMMUNITY)
Admission: RE | Admit: 2019-04-21 | Discharge: 2019-04-21 | Disposition: A | Discharge status: Home / Self Care | Payer: Medicare HMO | Attending: Internal Medicine | Admitting: Internal Medicine

## 2019-04-21 DIAGNOSIS — Z7982 Long term (current) use of aspirin: Secondary | ICD-10-CM | POA: Insufficient documentation

## 2019-04-21 DIAGNOSIS — Z7902 Long term (current) use of antithrombotics/antiplatelets: Secondary | ICD-10-CM | POA: Diagnosis not present

## 2019-04-21 DIAGNOSIS — Z8249 Family history of ischemic heart disease and other diseases of the circulatory system: Secondary | ICD-10-CM | POA: Diagnosis not present

## 2019-04-21 DIAGNOSIS — Z888 Allergy status to other drugs, medicaments and biological substances status: Secondary | ICD-10-CM | POA: Insufficient documentation

## 2019-04-21 DIAGNOSIS — I110 Hypertensive heart disease with heart failure: Secondary | ICD-10-CM | POA: Diagnosis not present

## 2019-04-21 DIAGNOSIS — Z006 Encounter for examination for normal comparison and control in clinical research program: Secondary | ICD-10-CM | POA: Insufficient documentation

## 2019-04-21 DIAGNOSIS — M1990 Unspecified osteoarthritis, unspecified site: Secondary | ICD-10-CM | POA: Insufficient documentation

## 2019-04-21 DIAGNOSIS — Z825 Family history of asthma and other chronic lower respiratory diseases: Secondary | ICD-10-CM | POA: Insufficient documentation

## 2019-04-21 DIAGNOSIS — K219 Gastro-esophageal reflux disease without esophagitis: Secondary | ICD-10-CM | POA: Insufficient documentation

## 2019-04-21 DIAGNOSIS — I252 Old myocardial infarction: Secondary | ICD-10-CM | POA: Insufficient documentation

## 2019-04-21 DIAGNOSIS — Z950 Presence of cardiac pacemaker: Secondary | ICD-10-CM | POA: Diagnosis not present

## 2019-04-21 DIAGNOSIS — Z7984 Long term (current) use of oral hypoglycemic drugs: Secondary | ICD-10-CM | POA: Insufficient documentation

## 2019-04-21 DIAGNOSIS — Z79899 Other long term (current) drug therapy: Secondary | ICD-10-CM | POA: Diagnosis not present

## 2019-04-21 DIAGNOSIS — Z95810 Presence of automatic (implantable) cardiac defibrillator: Secondary | ICD-10-CM | POA: Insufficient documentation

## 2019-04-21 DIAGNOSIS — Z87891 Personal history of nicotine dependence: Secondary | ICD-10-CM | POA: Diagnosis not present

## 2019-04-21 DIAGNOSIS — Z951 Presence of aortocoronary bypass graft: Secondary | ICD-10-CM | POA: Insufficient documentation

## 2019-04-21 DIAGNOSIS — I447 Left bundle-branch block, unspecified: Secondary | ICD-10-CM | POA: Insufficient documentation

## 2019-04-21 DIAGNOSIS — J449 Chronic obstructive pulmonary disease, unspecified: Secondary | ICD-10-CM | POA: Insufficient documentation

## 2019-04-21 DIAGNOSIS — E119 Type 2 diabetes mellitus without complications: Secondary | ICD-10-CM | POA: Diagnosis not present

## 2019-04-21 DIAGNOSIS — I255 Ischemic cardiomyopathy: Secondary | ICD-10-CM | POA: Insufficient documentation

## 2019-04-21 DIAGNOSIS — Z955 Presence of coronary angioplasty implant and graft: Secondary | ICD-10-CM | POA: Diagnosis not present

## 2019-04-21 DIAGNOSIS — I25119 Atherosclerotic heart disease of native coronary artery with unspecified angina pectoris: Secondary | ICD-10-CM | POA: Insufficient documentation

## 2019-04-21 DIAGNOSIS — I6529 Occlusion and stenosis of unspecified carotid artery: Secondary | ICD-10-CM | POA: Insufficient documentation

## 2019-04-21 DIAGNOSIS — I5042 Chronic combined systolic (congestive) and diastolic (congestive) heart failure: Secondary | ICD-10-CM | POA: Diagnosis not present

## 2019-04-21 HISTORY — PX: ICD IMPLANT: EP1208

## 2019-04-21 HISTORY — DX: Presence of automatic (implantable) cardiac defibrillator: Z95.810

## 2019-04-21 LAB — GLUCOSE, CAPILLARY: Glucose-Capillary: 104 mg/dL — ABNORMAL HIGH (ref 70–99)

## 2019-04-21 SURGERY — ICD IMPLANT

## 2019-04-21 MED ORDER — FENTANYL CITRATE (PF) 100 MCG/2ML IJ SOLN
INTRAMUSCULAR | Status: AC
  Filled 2019-04-21: qty 2

## 2019-04-21 MED ORDER — CEFAZOLIN SODIUM-DEXTROSE 2-4 GM/100ML-% IV SOLN
INTRAVENOUS | Status: AC
  Filled 2019-04-21: qty 100

## 2019-04-21 MED ORDER — ONDANSETRON HCL 4 MG/2ML IJ SOLN: 4.0000 mg | Freq: Four times a day (QID) | INTRAMUSCULAR | Status: DC | PRN

## 2019-04-21 MED ORDER — SODIUM CHLORIDE 0.9 % IV SOLN: INTRAVENOUS | Status: DC

## 2019-04-21 MED ORDER — HEPARIN (PORCINE) IN NACL 1000-0.9 UT/500ML-% IV SOLN
INTRAVENOUS | Status: AC
  Filled 2019-04-21: qty 500

## 2019-04-21 MED ORDER — LIDOCAINE-EPINEPHRINE 1 %-1:100000 IJ SOLN
INTRAMUSCULAR | Status: AC
  Filled 2019-04-21: qty 3

## 2019-04-21 MED ORDER — ACETAMINOPHEN 325 MG PO TABS: 325.0000 mg | ORAL_TABLET | ORAL | Status: DC | PRN

## 2019-04-21 MED ORDER — SODIUM CHLORIDE 0.9 % IV SOLN
INTRAVENOUS | Status: AC
  Filled 2019-04-21: qty 2

## 2019-04-21 MED ORDER — MIDAZOLAM HCL 5 MG/5ML IJ SOLN
INTRAMUSCULAR | Status: AC
  Filled 2019-04-21: qty 5

## 2019-04-21 MED ORDER — HEPARIN (PORCINE) IN NACL 1000-0.9 UT/500ML-% IV SOLN
INTRAVENOUS | Status: DC | PRN
  Administered 2019-04-21: 500 mL

## 2019-04-21 MED ORDER — SODIUM CHLORIDE 0.9 % IV SOLN
80.0000 mg | INTRAVENOUS | Status: AC
  Administered 2019-04-21: 80 mg

## 2019-04-21 MED ORDER — CEFAZOLIN SODIUM-DEXTROSE 2-4 GM/100ML-% IV SOLN
2.0000 g | INTRAVENOUS | Status: AC
  Administered 2019-04-21: 2 g via INTRAVENOUS

## 2019-04-21 MED ORDER — FENTANYL CITRATE (PF) 100 MCG/2ML IJ SOLN
INTRAMUSCULAR | Status: DC | PRN
  Administered 2019-04-21 (×2): 12.5 ug via INTRAVENOUS

## 2019-04-21 MED ORDER — LIDOCAINE HCL 1 % IJ SOLN
INTRAMUSCULAR | Status: AC
  Filled 2019-04-21: qty 60

## 2019-04-21 MED ORDER — MIDAZOLAM HCL 5 MG/5ML IJ SOLN
INTRAMUSCULAR | Status: DC | PRN
  Administered 2019-04-21 (×2): 1 mg via INTRAVENOUS

## 2019-04-21 MED ORDER — LIDOCAINE HCL (PF) 1 % IJ SOLN
INTRAMUSCULAR | Status: DC | PRN
  Administered 2019-04-21: 60 mL

## 2019-04-21 SURGICAL SUPPLY — 9 items
CABLE SURGICAL S-101-97-12 (CABLE) ×2 IMPLANT
HEMOSTAT SURGICEL 2X4 FIBR (HEMOSTASIS) ×1 IMPLANT
ICD VISIA MRI VR DVFB1D4 (ICD Generator) IMPLANT
KIT MICROPUNCTURE NIT STIFF (SHEATH) ×1 IMPLANT
LEAD SPRINT QUAT SEC 6935M-55 (Lead) ×1 IMPLANT
PAD PRO RADIOLUCENT 2001M-C (PAD) ×2 IMPLANT
SHEATH 9FR PRELUDE SNAP 13 (SHEATH) ×1 IMPLANT
TRAY PACEMAKER INSERTION (PACKS) ×2 IMPLANT
VISIA MRI VR DVFB1D4 (ICD Generator) ×2 IMPLANT

## 2019-04-21 NOTE — Discharge Instructions (Signed)
    Tomorrow, 04/22/2019, PLEASE SEND A REMOTE DEVICE TRANSMISSION     Supplemental Discharge Instructions for  Pacemaker/Defibrillator Patients  Activity No heavy lifting or vigorous activity with your left/right arm for 6 to 8 weeks.  Do not raise your left/right arm above your head for one week.  Gradually raise your affected arm as drawn below.             04/25/19                     04/26/19                     04/27/19                  04/28/19 __  NO DRIVING for 1 week ; you may begin driving on  [id number]  .  WOUND CARE - Keep the wound area clean and dry.  Do not get this area wet for one week. No showers for one week; you may shower on  04/28/19  . - Remove arm sling tomorrow 04/22/19 - The derma-bond (skin glue) covering your wound stays in place until your wound check visit, do not peel off. - No bandage is needed on the site.  DO  NOT apply any creams, oils, or ointments to the wound area. - If you notice any drainage or discharge from the wound, any swelling or bruising at the site, or you develop a fever > 101? F after you are discharged home, call the office at once.  Special Instructions - You are still able to use cellular telephones; use the ear opposite the side where you have your pacemaker/defibrillator.  Avoid carrying your cellular phone near your device. - When traveling through airports, show security personnel your identification card to avoid being screened in the metal detectors.  Ask the security personnel to use the hand wand. - Avoid arc welding equipment, MRI testing (magnetic resonance imaging), TENS units (transcutaneous nerve stimulators).  Call the office for questions about other devices. - Avoid electrical appliances that are in poor condition or are not properly grounded. - Microwave ovens are safe to be near or to operate.  Additional information for defibrillator patients should your device go off: - If your device goes off ONCE and you feel fine  afterward, notify the device clinic nurses. - If your device goes off ONCE and you do not feel well afterward, call 911. - If your device goes off TWICE, call 911. - If your device goes off THREE times in one day, call 911.  DO NOT DRIVE YOURSELF OR A FAMILY MEMBER WITH A DEFIBRILLATOR TO THE HOSPITAL--CALL 911.
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
## 2019-05-02 ENCOUNTER — Ambulatory Visit (INDEPENDENT_AMBULATORY_CARE_PROVIDER_SITE_OTHER): Payer: Medicare HMO | Admitting: *Deleted

## 2019-05-02 ENCOUNTER — Other Ambulatory Visit: Payer: Self-pay

## 2019-05-02 ENCOUNTER — Telehealth: Payer: Self-pay

## 2019-05-02 DIAGNOSIS — I255 Ischemic cardiomyopathy: Secondary | ICD-10-CM | POA: Diagnosis not present

## 2019-05-02 DIAGNOSIS — Z95810 Presence of automatic (implantable) cardiac defibrillator: Secondary | ICD-10-CM

## 2019-05-02 LAB — CUP PACEART INCLINIC DEVICE CHECK
Battery Remaining Longevity: 138 mo
Battery Voltage: 3.17 V
Brady Statistic RV Percent Paced: 0.01 %
Date Time Interrogation Session: 20210126115623
HighPow Impedance: 52 Ohm
Implantable Lead Implant Date: 20210115
Implantable Lead Location: 753860
Implantable Pulse Generator Implant Date: 20210115
Lead Channel Impedance Value: 380 Ohm
Lead Channel Impedance Value: 380 Ohm
Lead Channel Pacing Threshold Amplitude: 0.5 V
Lead Channel Pacing Threshold Pulse Width: 0.4 ms
Lead Channel Sensing Intrinsic Amplitude: 8.5 mV
Lead Channel Setting Pacing Amplitude: 3.5 V
Lead Channel Setting Pacing Pulse Width: 0.4 ms
Lead Channel Setting Sensing Sensitivity: 0.3 mV

## 2019-05-02 NOTE — Progress Notes (Signed)
Wound check appointment. Dermabond removed prior to visit. Wound without redness or drainage, with minimal amount of edema noted along device margins. No hematoma present. Incision edges approximated, wound well healed. Normal device function. Thresholds, sensing, and impedances consistent with implant measurements. Device programmed at 3.5V for extra safety margin until 3 month visit. Histogram distribution appropriate for patient and level of activity. No AF or ventricular arrhythmias noted. Patient educated about wound care, arm mobility, lifting restrictions, shock plan and carelink monitor. ROV 08/07/19 with Dr. Caryl Comes. Next home remote 07/21/19.

## 2019-05-02 NOTE — Telephone Encounter (Signed)
Called patient to inform her we received her home manual transmission.  Reviewed results with patient and patient verbalizes understanding.

## 2019-05-04 ENCOUNTER — Other Ambulatory Visit: Payer: Self-pay

## 2019-05-04 ENCOUNTER — Telehealth (HOSPITAL_COMMUNITY): Payer: Self-pay

## 2019-05-04 DIAGNOSIS — I6523 Occlusion and stenosis of bilateral carotid arteries: Secondary | ICD-10-CM

## 2019-05-04 NOTE — Telephone Encounter (Signed)

## 2019-05-04 NOTE — Progress Notes (Signed)
HISTORY AND PHYSICAL     CC:  follow up. Requesting Provider:  Glendale Chard, MD  HPI: This is a 79 y.o. female here for follow up for carotid artery stenosis.   Pt was last seen in January 2020 by Dr. Donzetta Matters.  At that time, she had been followed for carotid stenosis with Dr. Irven Shelling office with Dr. Virgina Jock for some time.  She does not have any history of stroke TIA or amaurosis.  She does have a previous history of a CABG as well as PCI to her saphenous vein graft in March of last year.  She has ischemic cardiomyopathy, hypertension, hyperlipidemia.  She had CT scan prior to today's visit and also presents with carotid duplex in our office today.    Dr. Donzetta Matters had reviewed the outside duplex, which demonstrated 70% bilateral carotid stenosis with PSP on the right 255 and EDV 46 and on the left 242 and EDV 45.  He independently reviewed the carotid duplex she had that day, which revealed 40-59% bilateral ICA stenosis.  He felt that it was around 60% and given that she was asymptomatic, she should be treated medically.  He discussed CEA as he did not think she was a good stent candidate.  He recommended that she f/u in one year with duplex.  She is here today for that visit.   She presents today for follow up.  She denies any weakness, numbness or clumsiness of an extremity.  She denies any speech difficulties or amaurosis fugax.   She states she had a defibrillator placed a couple of weeks ago.   The pt is on a statin for cholesterol management.  The pt is on a daily aspirin.   Other AC:  Plavix The pt is on CCB, BB, ARB for hypertension.   The pt is diabetic.   Tobacco hx:  former   Past Medical History:  Diagnosis Date  . Anginal pain ([place])    h/o, denies today   . Arthritis    hip - both, shot in L shoulder- 2 weeks ago  . Asthma   . Carotid artery occlusion   . CHF (congestive heart failure) ([place])   . Complication of anesthesia   . Coronary artery disease   . Diabetes mellitus  without complication ([place])   . GERD (gastroesophageal reflux disease)   . Hypertension    followed by Dr. Einar Gip  . MYOCARDIAL INFARCTION 05/25/2007   Qualifier: History of  By: Milana Obey, Doroteo Bradford    . Myocardial infarction ([place]) 1997  . PONV (postoperative nausea and vomiting)   . Shortness of breath     Past Surgical History:  Procedure Laterality Date  . CARDIAC CATHETERIZATION  05/2012   see note  . CORONARY ARTERY BYPASS GRAFT  [address]  . CORONARY STENT INTERVENTION Right 06/18/2017   Procedure: CORONARY STENT INTERVENTION;  Surgeon: Adrian Prows, MD;  Location: [facility];  Service: Cardiovascular;  Laterality: Right;  . EYE SURGERY     both eyes, laser procedure  . ICD IMPLANT N/A 04/21/2019   Procedure: ICD IMPLANT;  Surgeon: Deboraha Sprang, MD;  Location: [facility];  Service: Cardiovascular;  Laterality: N/A;  . JOINT REPLACEMENT Left 12/13/12   hip  . LEFT HEART CATH AND CORONARY ANGIOGRAPHY N/A 11/25/2017   Procedure: LEFT HEART CATH AND CORONARY ANGIOGRAPHY;  Surgeon: [name], MD;  Location: [facility];  Service: Cardiovascular;  Laterality: N/A;  . LEFT  HEART CATH AND CORS/GRAFTS ANGIOGRAPHY N/A 06/18/2017   Procedure: LEFT HEART CATH AND CORS/GRAFTS ANGIOGRAPHY;  Surgeon: Adrian Prows, MD;  Location: [facility];  Service: Cardiovascular;  Laterality: N/A;  . LEFT HEART CATHETERIZATION WITH CORONARY/GRAFT ANGIOGRAM N/A 04/21/2011   Procedure: LEFT HEART CATHETERIZATION WITH Beatrix Fetters;  Surgeon: Laverda Page, MD;  Location: [facility] CATH LAB;  Service: Cardiovascular;  Laterality: N/A;  . PERCUTANEOUS CORONARY INTERVENTION-BALLOON ONLY Right 05/12/2011   Procedure: PERCUTANEOUS CORONARY INTERVENTION-BALLOON ONLY;  Surgeon: Laverda Page, MD;  Location: [facility];  Service: Cardiovascular;  Laterality: Right;  . TOTAL HIP ARTHROPLASTY Left 12/13/2012   Procedure: TOTAL HIP ARTHROPLASTY ANTERIOR APPROACH;   Surgeon: Hessie Dibble, MD;  Location: [facility];  Service: Orthopedics;  Laterality: Left;  . TOTAL HIP ARTHROPLASTY Right 01/30/2014   Procedure: TOTAL HIP ARTHROPLASTY ANTERIOR APPROACH;  Surgeon: Hessie Dibble, MD;  Location: [facility];  Service: Orthopedics;  Laterality: Right;  . TUBAL LIGATION      Allergies  Allergen Reactions  . Lisinopril Cough    Current Outpatient Medications  Medication Sig Dispense Refill  . acetaminophen (TYLENOL) 650 MG CR tablet Take 650 mg by mouth every 8 (eight) hours as needed for pain.    Marland Kitchen albuterol (VENTOLIN HFA) 108 (90 Base) MCG/ACT inhaler Inhale 2 puffs into the lungs every 4 (four) hours as needed for wheezing or shortness of breath. 6.7 g 3  . amLODipine (NORVASC) 2.5 MG tablet Take 1 tablet (2.5 mg total) by mouth daily. 30 tablet 5  . aspirin EC 81 MG tablet Take 1 tablet (81 mg total) by mouth daily. (Patient taking differently: Take 81 mg by mouth every evening. ) 90 tablet 3  . Cholecalciferol (VITAMIN D) 2000 UNITS CAPS Take 2,000 Units by mouth daily.     . clopidogrel (PLAVIX) 75 MG tablet Take 1 tablet (75 mg total) by mouth daily. 90 tablet 2  . dorzolamide-timolol (COSOPT) 22.3-6.8 MG/ML ophthalmic solution Place 1 drop into both eyes 3 (three) times daily.    Marland Kitchen ezetimibe (ZETIA) 10 MG tablet Take 1 tablet (10 mg total) by mouth daily. 90 tablet 2  . famotidine (PEPCID) 20 MG tablet Take one pill one hour before bedtime (Patient taking differently: Take 20 mg by mouth at bedtime. ) 30 tablet 11  . furosemide (LASIX) 40 MG tablet Take 1 tablet (40 mg total) by mouth daily. 90 tablet 3  . gabapentin (NEURONTIN) 100 MG capsule Take 1 capsule (100 mg total) by mouth 3 (three) times daily. One three times daily (Patient taking differently: Take 100 mg by mouth 3 (three) times daily. ) 90 capsule 2  . HYDROcodone-homatropine (HYDROMET) 5-1.5 MG/5ML syrup Take 5 mLs by mouth every 6 (six) hours as needed. (Patient taking differently: Take 5 mLs  by mouth every 6 (six) hours as needed for cough. ) 120 mL 0  . isosorbide-hydrALAZINE (BIDIL) 20-37.5 MG tablet Take 2 tablets by mouth 3 (three) times daily. 15 tablet 0  . metoprolol succinate (TOPROL-XL) 50 MG 24 hr tablet Take 1 tablet (50 mg total) by mouth daily. Take with or immediately following a meal. 30 tablet 2  . montelukast (SINGULAIR) 10 MG tablet Take 10 mg by mouth at bedtime.    . nitroGLYCERIN (NITROSTAT) 0.4 MG SL tablet Place 0.4 mg under the tongue every 5 (five) minutes as needed for chest pain.    Marland Kitchen nystatin (NYSTATIN) powder Apply to affected area tid prn (Patient not taking: Reported on  04/18/2019) 60 g 0  . pantoprazole (PROTONIX) 40 MG tablet Take 1 tablet (40 mg total) by mouth daily. 90 tablet 1  . ranolazine (RANEXA) 500 MG 12 hr tablet Take 2 tablets (1,000 mg total) by mouth 2 (two) times daily. (Patient taking differently: Take 500 mg by mouth 2 (two) times daily. ) 60 tablet 6  . rosuvastatin (CRESTOR) 40 MG tablet Take 1 tablet (40 mg total) by mouth daily. 90 tablet 2  . sacubitril-valsartan (ENTRESTO) 97-103 MG Take 1 tablet by mouth 2 (two) times daily. 120 tablet 3  . sitaGLIPtin (JANUVIA) 100 MG tablet Take 1 tablet (100 mg total) by mouth daily. 90 tablet 3  . spironolactone (ALDACTONE) 50 MG tablet TAKE 1 TABLET BY MOUTH EVERY DAY (Patient taking differently: Take 50 mg by mouth daily at 12 noon. ) 90 tablet 2  . temazepam (RESTORIL) 15 MG capsule Take 15 mg by mouth at bedtime as needed for sleep.  1  . traMADol (ULTRAM) 50 MG tablet Take 1 tablet (50 mg total) by mouth every 6 (six) hours as needed. (Patient not taking: Reported on 04/18/2019) 20 tablet 0   No current facility-administered medications for this visit.    Family History  Problem Relation Age of Onset  . Allergies Mother   . Asthma Mother   . Heart disease Mother   . Heart attack Mother   . Allergies Son   . Asthma Son   . Heart disease Father   . Heart attack Father   . Breast  cancer Daughter 70    Social History   Socioeconomic History  . Marital status: Divorced    Spouse name: Not on file  . Number of children: 4  . Years of education: Not on file  . Highest education level: Not on file  Occupational History  . Not on file  Tobacco Use  . Smoking status: Former Smoker    Packs/day: 0.50    Years: 2.00    Pack years: 1.00    Types: Cigarettes    Quit date: 12/08/1983    Years since quitting: 35.4  . Smokeless tobacco: Never Used  Substance and Sexual Activity  . Alcohol use: No  . Drug use: No  . Sexual activity: Not Currently  Other Topics Concern  . Not on file  Social History Narrative  . Not on file   Social Determinants of Health   Financial Resource Strain: Low Risk   . Difficulty of Paying Living Expenses: Not hard at all  Food Insecurity: No Food Insecurity  . Worried About Charity fundraiser in the Last Year: Never true  . Ran Out of Food in the Last Year: Never true  Transportation Needs: No Transportation Needs  . Lack of Transportation (Medical): No  . Lack of Transportation (Non-Medical): No  Physical Activity: Insufficiently Active  . Days of Exercise per Week: 2 days  . Minutes of Exercise per Session: 30 min  Stress: No Stress Concern Present  . Feeling of Stress : Not at all  Social Connections:   . Frequency of Communication with Friends and Family: Not on file  . Frequency of Social Gatherings with Friends and Family: Not on file  . Attends Religious Services: Not on file  . Active Member of Clubs or Organizations: Not on file  . Attends Archivist Meetings: Not on file  . Marital Status: Not on file  Intimate Partner Violence: Not At Risk  . Fear of Current or  Ex-Partner: No  . Emotionally Abused: No  . Physically Abused: No  . Sexually Abused: No     REVIEW OF SYSTEMS:   [X]  denotes positive finding, [ ]  denotes negative finding Cardiac  Comments:  Chest pain or chest pressure:    Shortness  of breath upon exertion:    Short of breath when lying flat:    Irregular heart rhythm:        Vascular    Pain in calf, thigh, or hip brought on by ambulation:    Pain in feet at night that wakes you up from your sleep:     Blood clot in your veins:    Leg swelling:         Pulmonary    Oxygen at home:    Productive cough:  x   Wheezing:  x       Neurologic    Sudden weakness in arms or legs:     Sudden numbness in arms or legs:     Sudden onset of difficulty speaking or slurred speech:    Temporary loss of vision in one eye:     Problems with dizziness:         Gastrointestinal    Blood in stool:     Vomited blood:         Genitourinary    Burning when urinating:     Blood in urine:        Psychiatric    Major depression:         Hematologic    Bleeding problems:    Problems with blood clotting too easily:        Skin    Rashes or ulcers:        Constitutional    Fever or chills:      PHYSICAL EXAMINATION:  Today's Vitals   05/05/19 0923 05/05/19 0926  BP: (!) 157/84 (!) 155/86  Pulse: 67 67  Resp: 14   Temp: (!) 97.1 F (36.2 C)   TempSrc: Temporal   SpO2: 99%   Weight: 140 lb (63.5 kg)   Height: 5' (1.524 m)    Body mass index is 27.34 kg/m.   General:  WDWN in NAD; vital signs documented above Gait: Not observed HENT: WNL, normocephalic Pulmonary: normal non-labored breathing , without Rales, rhonchi,  wheezing Cardiac: regular HR, without  Murmurs, rubs or gallops; with carotid bruits bilaterally Abdomen: soft, NT, no masses Skin: without rashes Vascular Exam/Pulses:  Right Left  Radial 2+ (normal) 2+ (normal)  Ulnar Unable to palpate  2+ (normal)  Popliteal Unable to palpate  Unable to palpate   DP 2+ (normal) 2+ (normal)  PT 2+ (normal) 2+ (normal)   Extremities: without ischemic changes, without Gangrene , without cellulitis; without open wounds;  Musculoskeletal: no muscle wasting or atrophy  Neurologic: A&O X 3 Psychiatric:   The pt has Normal affect.   Non-Invasive Vascular Imaging:   Carotid Duplex on 05/05/2019: Right:  60-79% ICA stenosis; EDV 66cm/s Left:  60-79% ICA stenosis;  EDV 77cm/s Vertebrals: Bilateral vertebral arteries demonstrate antegrade flow.  Subclavians: Normal flow hemodynamics were seen in bilateral subclavian arteries.   Previous Carotid duplex on 01/09/2019: Right:  40-59% ICA stenosis; EDV 43cm/s Left:  40-59% ICA stenosis; EDV 61cm/s  Previous Carotid duplex on 04/29/2018: Right: 40-59% ICA stenosis  Left:   40-59% ICA stenosis Vertebrals: Left vertebral artery demonstrates antegrade flow. Right  vertebral  artery was not visualized.  Subclavians: Normal flow hemodynamics were  seen in bilateral subclavian arteries.    ASSESSMENT/PLAN:: 79 y.o. female here for follow up carotid artery stenosis.  -pt doing well and remains asymptomatic.   -pt carotid duplex with slightly increased velocities in EDV bilaterally.  Discussed with Dr. Donzetta Matters and will see pt back in 6 months with repeat duplex.  Continue asa/statin. -discussed s/s of stroke with pt and they understand should they develop any of these sx, they will go to the nearest ER. -she will call sooner should she have any issues.   Leontine Locket, PA-C [facility] [phone]  Clinic MD:  Donzetta Matters

## 2019-05-05 ENCOUNTER — Ambulatory Visit: Payer: Medicare HMO | Admitting: Physician Assistant

## 2019-05-05 ENCOUNTER — Ambulatory Visit (HOSPITAL_COMMUNITY)
Admission: RE | Admit: 2019-05-05 | Discharge: 2019-05-05 | Disposition: A | Discharge status: Home / Self Care | Payer: Medicare HMO | Source: Ambulatory Visit | Attending: Vascular Surgery | Admitting: Vascular Surgery

## 2019-05-05 ENCOUNTER — Other Ambulatory Visit: Payer: Self-pay

## 2019-05-05 VITALS — BP 155/86 | HR 67 | Temp 97.1°F | Resp 14 | Ht 60.0 in | Wt 140.0 lb

## 2019-05-05 DIAGNOSIS — I6523 Occlusion and stenosis of bilateral carotid arteries: Secondary | ICD-10-CM

## 2019-05-08 ENCOUNTER — Other Ambulatory Visit: Payer: Self-pay | Admitting: *Deleted

## 2019-05-08 DIAGNOSIS — I6523 Occlusion and stenosis of bilateral carotid arteries: Secondary | ICD-10-CM

## 2019-05-09 ENCOUNTER — Other Ambulatory Visit: Payer: Self-pay

## 2019-05-09 ENCOUNTER — Encounter: Payer: Self-pay | Admitting: Internal Medicine

## 2019-05-09 ENCOUNTER — Ambulatory Visit: Payer: Medicare HMO | Admitting: Internal Medicine

## 2019-05-09 DIAGNOSIS — J45909 Unspecified asthma, uncomplicated: Secondary | ICD-10-CM

## 2019-05-09 DIAGNOSIS — R05 Cough: Secondary | ICD-10-CM | POA: Diagnosis not present

## 2019-05-09 DIAGNOSIS — R053 Chronic cough: Secondary | ICD-10-CM

## 2019-05-09 MED ORDER — GABAPENTIN 300 MG PO CAPS: 300.0000 mg | ORAL_CAPSULE | Freq: Three times a day (TID) | ORAL | 2 refills | Status: DC

## 2019-05-09 NOTE — Progress Notes (Signed)
Subjective:    Patient ID: Lynn Carey, female    DOB: Apr 29, 1940   MRN: [id number]  Brief patient profile:  85   yobf quit smoking around 1985 with onset "asthma attacks"  Which resolved p quit  did not need medications until mid 90's then needed "medications for breathing" rx  per Dr  Kelton Pillar then onset of cough and worse breathing started summer 2014 under Bryon Lions > referred 05/26/13 to pulmonary clinic for cough with nl lung function documented 07/27/2013      History of Present Illness  05/26/2013 1st [facility] office visit/ Lynn Carey  Chief Complaint  Patient presents with  . Advice Only    Referred by Dr Redmond Baseman for chronic cough X8 mos.  Pt c/o sometimes prod cough with clear mucous, made worse by eating.    [facility] eval and rx for GERD then referred to Dr Redmond Baseman Only improved on hydromet  Cough only when eat and lie down  ? Better with inhalers, already on dulera 100 2bid no sign purulent sputum or even much volume No sob over baseline rec Stop tribenzor and lopressor Start benicar 93/26 and bystolic 10 mg daily  Continue Nexium 40 mg Take 30- 60 min before your first and last meals of the day  Continue dulera 100 Take 2 puffs first thing in am and then another 2 puffs about 12 hours later   GERD  Diet      06/15/2013 f/u ov/Lynn Carey re: cough since summer 2014/ brought meds in a pill organizer, unlabeled, no saba Chief Complaint  Patient presents with  . Follow-up    Pt reports cough is about 50% better since last visit. No new co's today.   cough all day long but no longer at night and not using cough suppression at all. Constant sensation she needs to clear her throat Not limited by breathing from desired activities   >>Continue nexium Take 30- 60 min before your first and last meals of the day automatically For drainage > use sugarless candy and chlortrimeton 4 mg every 4 hours as needed (over the counter) Stop dulera for now and just use ventolin every  4 hours as needed   06/23/2013 Follow up and Med review Patient returns for followup and medication review. We reviewed all her medications and organized them into a medication calendar with patient education It appears the patient is taking her medications Last visit. Patient was taken off of Dulera due to cough .  Since last visit. Patient is feeling some better. No flare of cough or wheezing off Dulera .  No increased SABA use. No use for last 1 week.  rec No change rx > follow med calendar   07/27/2013 f/u ov/Lynn Carey re: chronic cough with nl pfts/ no longer on any inhalers at all/ no better or worse rec depomedrol 120 mg today Completely eliminate all coughing x 3 straight days  Gabapentin 100 mg three times daily  Please schedule a follow up office visit in 2 weeks, sooner if needed  Will start back on bystolic 10 mg daily samples for 2 weeks then regroup    08/09/2013 f/u ov/Lynn Carey re: pseudoasthma / now on neurontin 100 tid  Chief Complaint  Patient presents with  . Follow-up    Pt states cough is better compared to last visit. She is using her rescue inhaler at least once per day.   Not using med calendar well, not updated by various providers at time of ov so  it's not correct anyway rec The only change I recommend is to reduce the benicar to one half daily    Outpatient Encounter Prescriptions as of 08/09/2013  Medication Sig  . acetaminophen (TYLENOL ARTHRITIS PAIN) 650 MG CR tablet 2 tabs by mouth every 8 hours as needed for pain  . albuterol (PROVENTIL HFA;VENTOLIN HFA) 108 (90 BASE) MCG/ACT inhaler Inhale 2 puffs into the lungs every 4 (four) hours as needed for shortness of breath.   Marland Kitchen aspirin 81 MG tablet 2 tabs by mouth once daily  . chlorpheniramine (CHLOR-TRIMETON) 4 MG tablet Take 4 mg by mouth every 4 (four) hours as needed (drippy nose, drainage, throat clearing).  . cholecalciferol (VITAMIN D) 1000 UNITS tablet Take 2,000 Units by mouth daily.   Marland Kitchen dextromethorphan  (DELSYM) 30 MG/5ML liquid 2 tsp every 12 hours as needed for cough  . esomeprazole (NEXIUM) 40 MG capsule Take 40 mg by mouth 2 (two) times daily.  Marland Kitchen ezetimibe (ZETIA) 10 MG tablet Take 10 mg by mouth at bedtime.   . gabapentin (NEURONTIN) 100 MG capsule Take 1 capsule (100 mg total) by mouth 3 (three) times daily. One three times daily  . HYDROcodone-acetaminophen (NORCO/VICODIN) 5-325 MG per tablet Take 1-2 tablets by mouth every 4 (four) hours as needed.  Marland Kitchen ibuprofen (ADVIL,MOTRIN) 200 MG tablet Per bottle as needed for joint pain  . isosorbide-hydrALAZINE (BIDIL) 20-37.5 MG per tablet Take 1 tablet by mouth 3 (three) times daily.  . nebivolol (BYSTOLIC) 10 MG tablet Take 1 tablet (10 mg total) by mouth daily.  . nitroGLYCERIN (NITROSTAT) 0.4 MG SL tablet Place 0.4 mg under the tongue every 5 (five) minutes as needed. For chest pain    . olmesartan-hydrochlorothiazide (BENICAR HCT) 40-25 MG per tablet Take 1 tablet by mouth daily.  . ranolazine (RANEXA) 500 MG 12 hr tablet Take 500 mg by mouth 2 (two) times daily.  . rosuvastatin (CRESTOR) 20 MG tablet Take 20 mg by mouth at bedtime.   . sitaGLIPtin (JANUVIA) 100 MG tablet Take 100 mg by mouth daily.        04/03/2019  Re-establish ov/Lynn Carey re:  "cough never got better, then worse x 2 years (note last 3 visits progressive improvement off asthma rx, now back on it plus entresto since 09/2018  Chief Complaint  Patient presents with  . Pulmonary Consult    Referred by Dr Virgina Jock. She c/o cough for at least the past 10 years, worse x 2 years. She states she notices cough more when she changes atmospheres.   Dyspnea:  MMRC1 = can walk nl pace, flat grade, can't hurry or go uphills or steps s sob   Cough: worse with temperature change/ voice use / dry  Sleeping: not coughing unless temp changes  But it will sometimes wake her up / bed is flat with 3 pillows SABA use: not using symbicort unless she has to / not  02: none  rec Pantoprazole  (protonix) 40 mg   Take  30-60 min before first meal of the day and Pepcid (famotidine)  20 mg one hour before bedtime  until return to office - this is the best way to tell whether stomach acid is contributing to your problem.   Gabapentin 100 mg three times a day  For drainage / throat tickle stop clariton and zyrtec and  try take CHLORPHENIRAMINE  4 mg  (Chlortab 4mg   at [employer]   GERD diet  Please schedule a follow up office visit in 4  weeks, call sooner if needed with all medications     05/09/2019  f/u ov/Lynn Carey re: cough/ did not bring meds, brought chart but not updated  Chief Complaint  Patient presents with  . Follow-up    Cough is unchanged since the last visit. No new co's today. She is using her albuterol inhaler 3-4 x per day.   Dyspnea:  Not limited by breathing from desired activities  But very sedentary  Cough: worse with activity /with temperature changes  Sleeping: 3 pillows starts coughing if gets hot SABA use: only p ex, never pre-challenges  02: none  No obvious day to day or daytime variability or assoc excess/ purulent sputum or mucus plugs or hemoptysis or cp or chest tightness, subjective wheeze or overt sinus or hb symptoms.    . Also denies any obvious fluctuation of symptoms with weather or environmental changes or other aggravating or alleviating factors except as outlined above   No unusual exposure hx or h/o childhood pna/ asthma or knowledge of premature birth.  Current Allergies, Complete Past Medical History, Past Surgical History, Family History, and Social History were reviewed in [employer] [facility] record.  ROS  The following are not active complaints unless bolded Hoarseness, sore throat, dysphagia, dental problems, itching, sneezing,  nasal congestion or discharge of excess mucus or purulent secretions, ear ache,   fever, chills, sweats, unintended wt loss or wt gain, classically pleuritic or exertional cp,  orthopnea pnd or  arm/hand swelling  or leg swelling, presyncope, palpitations, abdominal pain, anorexia, nausea, vomiting, diarrhea  or change in bowel habits or change in bladder habits, change in stools or change in urine, dysuria, hematuria,  rash, arthralgias, visual complaints, headache, numbness, weakness or ataxia or problems with walking or coordination,  change in mood or  memory.        Current Meds  Medication Sig  . acetaminophen (TYLENOL) 650 MG CR tablet Take 650 mg by mouth every 8 (eight) hours as needed for pain.  Marland Kitchen albuterol (VENTOLIN HFA) 108 (90 Base) MCG/ACT inhaler Inhale 2 puffs into the lungs every 4 (four) hours as needed for wheezing or shortness of breath.  Marland Kitchen aspirin EC 81 MG tablet Take 1 tablet (81 mg total) by mouth daily. (Patient taking differently: Take 81 mg by mouth every evening. )  . Cholecalciferol (VITAMIN D) 2000 UNITS CAPS Take 2,000 Units by mouth daily.   . clopidogrel (PLAVIX) 75 MG tablet Take 1 tablet (75 mg total) by mouth daily.  . dorzolamide-timolol (COSOPT) 22.3-6.8 MG/ML ophthalmic solution Place 1 drop into both eyes 3 (three) times daily.  Marland Kitchen ezetimibe (ZETIA) 10 MG tablet Take 1 tablet (10 mg total) by mouth daily.  . famotidine (PEPCID) 20 MG tablet Take one pill one hour before bedtime (Patient taking differently: Take 20 mg by mouth at bedtime. )  . furosemide (LASIX) 40 MG tablet Take 1 tablet (40 mg total) by mouth daily.  Marland Kitchen gabapentin (NEURONTIN) 100 MG capsule Take 1 capsule (100 mg total) by mouth 3 (three) times daily. One three times daily (Patient taking differently: Take 100 mg by mouth 3 (three) times daily. )  . isosorbide-hydrALAZINE (BIDIL) 20-37.5 MG tablet Take 2 tablets by mouth 3 (three) times daily.  . metoprolol succinate (TOPROL-XL) 50 MG 24 hr tablet Take 1 tablet (50 mg total) by mouth daily. Take with or immediately following a meal.  . montelukast (SINGULAIR) 10 MG tablet Take 10 mg by mouth at bedtime.  . nitroGLYCERIN (NITROSTAT)  0.4  MG SL tablet Place 0.4 mg under the tongue every 5 (five) minutes as needed for chest pain.  Marland Kitchen nystatin (NYSTATIN) powder Apply to affected area tid prn  . pantoprazole (PROTONIX) 40 MG tablet Take 1 tablet (40 mg total) by mouth daily.  . ranolazine (RANEXA) 500 MG 12 hr tablet Take 2 tablets (1,000 mg total) by mouth 2 (two) times daily. (Patient taking differently: Take 500 mg by mouth 2 (two) times daily. )  . rosuvastatin (CRESTOR) 40 MG tablet Take 1 tablet (40 mg total) by mouth daily.  . sacubitril-valsartan (ENTRESTO) 97-103 MG Take 1 tablet by mouth 2 (two) times daily.  . sitaGLIPtin (JANUVIA) 100 MG tablet Take 1 tablet (100 mg total) by mouth daily.  Marland Kitchen spironolactone (ALDACTONE) 50 MG tablet TAKE 1 TABLET BY MOUTH EVERY DAY (Patient taking differently: Take 50 mg by mouth daily at 12 noon. )  . temazepam (RESTORIL) 15 MG capsule Take 15 mg by mouth at bedtime as needed for sleep.  . traMADol (ULTRAM) 50 MG tablet Take 1 tablet (50 mg total) by mouth every 6 (six) hours as needed.               Objective:   Physical Exam  05/09/2019            145  04/03/2019       140  08/09/13              150 06/15/2013  165  >06/23/2013 >163 06/23/2013 > 07/27/2013  157     amb bf good voice texture    Vital signs reviewed  05/09/2019  - Note at rest 02 sats  97% on RA        HEENT : pt wearing mask not removed for exam due to covid -19 concerns.    NECK :  without JVD/Nodes/TM/ nl carotid upstrokes bilaterally   LUNGS: no acc muscle use,  Nl contour chest which is clear to A and P bilaterally without cough on insp or exp maneuvers   CV:  RRR  no s3 or murmur or increase in P2, and no edema   ABD:  soft and nontender with nl inspiratory excursion in the supine position. No bruits or organomegaly appreciated, bowel sounds nl  MS:  Nl gait/ ext warm without deformities, calf tenderness, cyanosis or clubbing No obvious joint restrictions   SKIN: warm and dry without lesions     NEURO:  alert, approp, nl sensorium with  no motor or cerebellar deficits apparent.       I personally reviewed images and agree with radiology impression as follows:  CXR:   04/21/19  Interim placement of left-sided pacing device as above. Negative for pneumothorax. Minor atelectasis or scar in the left lower lung      Assessment & Plan:

## 2019-05-09 NOTE — Patient Instructions (Addendum)
Only use your albuterol as a rescue medication to be used if you can't catch your breath by resting or doing a relaxed purse lip breathing pattern.  - The less you use it, the better it will work when you need it. - Ok to use up to 2 puffs  every 4 hours if you must but call for immediate appointment if use goes up over your usual need - Don't leave home without it !!  (think of it like the spare tire for your car)   Gabapentin 300 mg three times daily    Try albuterol 15 min before an activity that you know would make you short of breath and see if it makes any difference and if makes none then don't take it after activity unless you can't catch your breath.   Please schedule a follow up office visit in 6 weeks, call sooner if needed

## 2019-05-10 ENCOUNTER — Encounter: Payer: Self-pay | Admitting: Internal Medicine

## 2019-05-10 NOTE — Assessment & Plan Note (Signed)
Onset around  2014 / ACEi intolerant  Dg Es 04/23/2012  . Nonspecific esophageal motility disorder (mild).  . Small sliding-type hiatal hernia. With Mild gastroesophageal reflux identified during the water siphon  test. - trial of neurontin 07/28/2013 > improved 08/09/13 so continue > pt d/c'd and worse> restarted 04/03/2019 > increased to 300 mg tid 05/09/2019   Some better so rec titrate up gabapentin if tolerated  To 300 tid  Discussed in detail all the  indications, usual  risks and alternatives  relative to the benefits with patient who agrees to proceed with Rx as outlined.             Each maintenance medication was reviewed in detail including emphasizing most importantly the difference between maintenance and prns and under what circumstances the prns are to be triggered using an action plan format where appropriate.  Total time for H and P, chart review, counseling,  and generating customized AVS unique to this office visit / charting = 20 min

## 2019-05-10 NOTE — Assessment & Plan Note (Addendum)
05/27/2013 p extensive coaching HFA effectiveness =    75% - trial off dulera [id number] due to cyclical upper airway coughing > no change  - PFTs 07/27/13 wnl   - The proper method of use, as well as anticipated side effects, of a metered-dose inhaler were discussed and demonstrated to the patient.     Advised:  Albuterol is a "rescue medication"  for relief of breathlessness  that does not improve by walking a slower pace or resting  for a few minutes (it doesn't really start working for 5 minutes anyway so ok to wait to see if resting helps)  However, if you are convinced, as many are, that albuterol  helps recover from activity faster then it's easy to tell if this is the case by re-challenging : stop, take the inhaler, then 5 minutes later try the exact same activity (intensity of workload) that just caused the symptoms and see if they are better by using the albuterol prior to exertion.  If  there is an activity that reproducibly causes the breathing problem every time you attempt it, try the albuterol  (either inhaler or nebulizer)  15 min before the activity on alternate days to see if there really is a difference and let me know what you find.   Pt informed of the seriousness of COVID 19 infection as a direct risk to lung health  and safey and to close contacts and should continue to wear a facemask in public and minimize exposure to public locations but especially avoid any area or activity where non-close contacts are not observing distancing or wearing an appropriate face mask.  I strongly recommended vaccine when offered.

## 2019-05-18 ENCOUNTER — Ambulatory Visit: Payer: Medicare HMO | Attending: Internal Medicine

## 2019-05-18 DIAGNOSIS — Z23 Encounter for immunization: Secondary | ICD-10-CM | POA: Insufficient documentation

## 2019-05-18 NOTE — Progress Notes (Signed)
   Covid-19 Vaccination Clinic  Name:  Lynn Carey    MRN: [id number] DOB: Jun 13, 1940  05/18/2019  Lynn Carey was observed post Covid-19 immunization for 15 minutes without incidence. She was provided with Vaccine Information Sheet and instruction to access the V-Safe system.   Lynn Carey was instructed to call 911 with any severe reactions post vaccine: Marland Kitchen Difficulty breathing  . Swelling of your face and throat  . A fast heartbeat  . A bad rash all over your body  . Dizziness and weakness    Immunizations Administered    Name Date Dose VIS Date Route   Pfizer COVID-19 Vaccine 05/18/2019 11:52 AM 0.3 mL 03/17/2019 Intramuscular   Manufacturer: [place]   Lot: [id number]   [place]: [id number]

## 2019-06-08 ENCOUNTER — Telehealth: Payer: Self-pay

## 2019-06-08 ENCOUNTER — Other Ambulatory Visit: Payer: Self-pay

## 2019-06-08 MED ORDER — SACUBITRIL-VALSARTAN 97-103 MG PO TABS: 1.0000 | ORAL_TABLET | Freq: Two times a day (BID) | ORAL | 3 refills | Status: DC

## 2019-06-10 ENCOUNTER — Ambulatory Visit: Payer: Medicare HMO | Attending: Internal Medicine

## 2019-06-10 DIAGNOSIS — Z23 Encounter for immunization: Secondary | ICD-10-CM | POA: Insufficient documentation

## 2019-06-10 NOTE — Progress Notes (Signed)
   Covid-19 Vaccination Clinic  Name:  Lynn Carey    MRN: [id number] DOB: [birth year]  06/10/2019  [name] was observed post Covid-19 immunization for 15 minutes without incident. She was provided with Vaccine Information Sheet and instruction to access the V-Safe system.   [name] was instructed to call 911 with any severe reactions post vaccine: Marland Kitchen Difficulty breathing  . Swelling of face and throat  . A fast heartbeat  . A bad rash all over body  . Dizziness and weakness   Immunizations Administered    Name Date Dose VIS Date Route   Pfizer COVID-19 Vaccine 06/10/2019 11:27 AM 0.3 mL 03/17/2019 Intramuscular   Manufacturer: [place]   Lot: [id number]   [place]: [id number]

## 2019-06-14 ENCOUNTER — Other Ambulatory Visit: Payer: Self-pay

## 2019-06-14 MED ORDER — SACUBITRIL-VALSARTAN 97-103 MG PO TABS: 1.0000 | ORAL_TABLET | Freq: Two times a day (BID) | ORAL | 3 refills | Status: AC

## 2019-06-14 NOTE — Telephone Encounter (Signed)
Spoke with patient briefly this morning 06/14/19 in regards to her Entresto. Patient stated she had spoken with another Medical Assistant "a week ago" in regards to needing paperwork filled out to receive more of her Entresto. Patient states she is out of medication but will bring paperwork to the office today so we can fax it out. We do not have any samples available, however- I did offer a "30 day Free Card" for her medication. Patient states she will be here today. 06/14/2019.

## 2019-06-20 ENCOUNTER — Other Ambulatory Visit: Payer: Self-pay

## 2019-06-20 ENCOUNTER — Encounter: Payer: Self-pay | Admitting: Internal Medicine

## 2019-06-20 ENCOUNTER — Ambulatory Visit: Payer: Medicare HMO | Admitting: Internal Medicine

## 2019-06-20 DIAGNOSIS — J45909 Unspecified asthma, uncomplicated: Secondary | ICD-10-CM | POA: Diagnosis not present

## 2019-06-20 DIAGNOSIS — R05 Cough: Secondary | ICD-10-CM

## 2019-06-20 DIAGNOSIS — R053 Chronic cough: Secondary | ICD-10-CM

## 2019-06-20 MED ORDER — GABAPENTIN 300 MG PO CAPS: 300.0000 mg | ORAL_CAPSULE | Freq: Four times a day (QID) | ORAL | 2 refills | Status: DC

## 2019-06-20 NOTE — Progress Notes (Signed)
Subjective:    Patient ID: Lynn Carey, female    DOB: Apr 29, 1940   MRN: [id number]  Brief patient profile:  85   yobf quit smoking around 1985 with onset "asthma attacks"  Which resolved p quit  did not need medications until mid 90's then needed "medications for breathing" rx  per Dr  Kelton Pillar then onset of cough and worse breathing started summer 2014 under Bryon Lions > referred 05/26/13 to pulmonary clinic for cough with nl lung function documented 07/27/2013      History of Present Illness  05/26/2013 1st [facility] office visit/ Stonewall Doss  Chief Complaint  Patient presents with  . Advice Only    Referred by Dr Redmond Baseman for chronic cough X8 mos.  Pt c/o sometimes prod cough with clear mucous, made worse by eating.    [facility] eval and rx for GERD then referred to Dr Redmond Baseman Only improved on hydromet  Cough only when eat and lie down  ? Better with inhalers, already on dulera 100 2bid no sign purulent sputum or even much volume No sob over baseline rec Stop tribenzor and lopressor Start benicar 93/26 and bystolic 10 mg daily  Continue Nexium 40 mg Take 30- 60 min before your first and last meals of the day  Continue dulera 100 Take 2 puffs first thing in am and then another 2 puffs about 12 hours later   GERD  Diet      06/15/2013 f/u ov/Kaily Wragg re: cough since summer 2014/ brought meds in a pill organizer, unlabeled, no saba Chief Complaint  Patient presents with  . Follow-up    Pt reports cough is about 50% better since last visit. No new co's today.   cough all day long but no longer at night and not using cough suppression at all. Constant sensation she needs to clear her throat Not limited by breathing from desired activities   >>Continue nexium Take 30- 60 min before your first and last meals of the day automatically For drainage > use sugarless candy and chlortrimeton 4 mg every 4 hours as needed (over the counter) Stop dulera for now and just use ventolin every  4 hours as needed   06/23/2013 Follow up and Med review Patient returns for followup and medication review. We reviewed all her medications and organized them into a medication calendar with patient education It appears the patient is taking her medications Last visit. Patient was taken off of Dulera due to cough .  Since last visit. Patient is feeling some better. No flare of cough or wheezing off Dulera .  No increased SABA use. No use for last 1 week.  rec No change rx > follow med calendar   07/27/2013 f/u ov/Cali Hope re: chronic cough with nl pfts/ no longer on any inhalers at all/ no better or worse rec depomedrol 120 mg today Completely eliminate all coughing x 3 straight days  Gabapentin 100 mg three times daily  Please schedule a follow up office visit in 2 weeks, sooner if needed  Will start back on bystolic 10 mg daily samples for 2 weeks then regroup    08/09/2013 f/u ov/Adam Sanjuan re: pseudoasthma / now on neurontin 100 tid  Chief Complaint  Patient presents with  . Follow-up    Pt states cough is better compared to last visit. She is using her rescue inhaler at least once per day.   Not using med calendar well, not updated by various providers at time of ov so  it's not correct anyway rec The only change I recommend is to reduce the benicar to one half daily    Outpatient Encounter Prescriptions as of 08/09/2013  Medication Sig  . acetaminophen (TYLENOL ARTHRITIS PAIN) 650 MG CR tablet 2 tabs by mouth every 8 hours as needed for pain  . albuterol (PROVENTIL HFA;VENTOLIN HFA) 108 (90 BASE) MCG/ACT inhaler Inhale 2 puffs into the lungs every 4 (four) hours as needed for shortness of breath.   Marland Kitchen aspirin 81 MG tablet 2 tabs by mouth once daily  . chlorpheniramine (CHLOR-TRIMETON) 4 MG tablet Take 4 mg by mouth every 4 (four) hours as needed (drippy nose, drainage, throat clearing).  . cholecalciferol (VITAMIN D) 1000 UNITS tablet Take 2,000 Units by mouth daily.   Marland Kitchen dextromethorphan  (DELSYM) 30 MG/5ML liquid 2 tsp every 12 hours as needed for cough  . esomeprazole (NEXIUM) 40 MG capsule Take 40 mg by mouth 2 (two) times daily.  Marland Kitchen ezetimibe (ZETIA) 10 MG tablet Take 10 mg by mouth at bedtime.   . gabapentin (NEURONTIN) 100 MG capsule Take 1 capsule (100 mg total) by mouth 3 (three) times daily. One three times daily  . HYDROcodone-acetaminophen (NORCO/VICODIN) 5-325 MG per tablet Take 1-2 tablets by mouth every 4 (four) hours as needed.  Marland Kitchen ibuprofen (ADVIL,MOTRIN) 200 MG tablet Per bottle as needed for joint pain  . isosorbide-hydrALAZINE (BIDIL) 20-37.5 MG per tablet Take 1 tablet by mouth 3 (three) times daily.  . nebivolol (BYSTOLIC) 10 MG tablet Take 1 tablet (10 mg total) by mouth daily.  . nitroGLYCERIN (NITROSTAT) 0.4 MG SL tablet Place 0.4 mg under the tongue every 5 (five) minutes as needed. For chest pain    . olmesartan-hydrochlorothiazide (BENICAR HCT) 40-25 MG per tablet Take 1 tablet by mouth daily.  . ranolazine (RANEXA) 500 MG 12 hr tablet Take 500 mg by mouth 2 (two) times daily.  . rosuvastatin (CRESTOR) 20 MG tablet Take 20 mg by mouth at bedtime.   . sitaGLIPtin (JANUVIA) 100 MG tablet Take 100 mg by mouth daily.        04/03/2019  Re-establish ov/Kaleeyah Cuffie re:  "cough never got better, then worse x 2 years (note last 3 visits progressive improvement off asthma rx, now back on it plus entresto since 09/2018  Chief Complaint  Patient presents with  . Pulmonary Consult    Referred by Dr Virgina Jock. She c/o cough for at least the past 10 years, worse x 2 years. She states she notices cough more when she changes atmospheres.   Dyspnea:  MMRC1 = can walk nl pace, flat grade, can't hurry or go uphills or steps s sob   Cough: worse with temperature change/ voice use / dry  Sleeping: not coughing unless temp changes  But it will sometimes wake her up / bed is flat with 3 pillows SABA use: not using symbicort unless she has to / not  02: none  rec Pantoprazole  (protonix) 40 mg   Take  30-60 min before first meal of the day and Pepcid (famotidine)  20 mg one hour before bedtime  until return to office - this is the best way to tell whether stomach acid is contributing to your problem.   Gabapentin 100 mg three times a day  For drainage / throat tickle stop clariton and zyrtec and  try take CHLORPHENIRAMINE  4 mg  (Chlortab 4mg   at [employer]   GERD diet  Please schedule a follow up office visit in 4  weeks, call sooner if needed with all medications     05/09/2019  f/u ov/Sharanda Shinault re: cough/ did not bring meds, brought chart but not updated  Chief Complaint  Patient presents with  . Follow-up    Cough is unchanged since the last visit. No new co's today. She is using her albuterol inhaler 3-4 x per day.   Dyspnea:  Not limited by breathing from desired activities  But very sedentary  Cough: worse with activity /with temperature changes  Sleeping: 3 pillows starts coughing if gets hot SABA use: only p ex, never pre-challenges  02: none rec Only use your albuterol as a rescue medication  Gabapentin 300 mg three times daily  Try albuterol 15 min before an activity that you know would make you short of breath and see if it makes any difference and if makes none then don't take it after activity unless you can't catch your breath.   06/20/2019  f/u ov/Sina Lucchesi re: uacs  Chief Complaint  Patient presents with  . Follow-up    Cough is some better since the last visit, prod with clear sputum. She is using her albuterol inhaler once daily on average.    Dyspnea:  Walks neighborhood 10 min daily before stops due to sob  Cough: daytime dry / on entresto  Sleeping: coughs if gets hot  SABA use: never pre-challenges 02: none    No obvious day to day or daytime variability or assoc excess/ purulent sputum or mucus plugs or hemoptysis or cp or chest tightness, subjective wheeze or overt sinus or hb symptoms.   sleeping without nocturnal  or early am  exacerbation  of respiratory  c/o's or need for noct saba. Also denies any obvious fluctuation of symptoms with weather or environmental changes or other aggravating or alleviating factors except as outlined above   No unusual exposure hx or h/o childhood pna/ asthma or knowledge of premature birth.  Current Allergies, Complete Past Medical History, Past Surgical History, Family History, and Social History were reviewed in [employer] record.  ROS  The following are not active complaints unless bolded Hoarseness, sore throat, dysphagia, dental problems, itching, sneezing,  nasal congestion or discharge of excess mucus or purulent secretions, ear ache,   fever, chills, sweats, unintended wt loss or wt gain, classically pleuritic or exertional cp,  orthopnea pnd or arm/hand swelling  or leg swelling, presyncope, palpitations, abdominal pain, anorexia, nausea, vomiting, diarrhea  or change in bowel habits or change in bladder habits, change in stools or change in urine, dysuria, hematuria,  rash, arthralgias, visual complaints, headache, numbness, weakness or ataxia or problems with walking or coordination,  change in mood or  memory.        Current Meds  Medication Sig  . acetaminophen (TYLENOL) 650 MG CR tablet Take 650 mg by mouth every 8 (eight) hours as needed for pain.  Marland Kitchen albuterol (VENTOLIN HFA) 108 (90 Base) MCG/ACT inhaler Inhale 2 puffs into the lungs every 4 (four) hours as needed for wheezing or shortness of breath.  Marland Kitchen aspirin EC 81 MG tablet Take 1 tablet (81 mg total) by mouth daily. (Patient taking differently: Take 81 mg by mouth every evening. )  . Cholecalciferol (VITAMIN D) 2000 UNITS CAPS Take 2,000 Units by mouth daily.   . clopidogrel (PLAVIX) 75 MG tablet Take 1 tablet (75 mg total) by mouth daily.  . dorzolamide-timolol (COSOPT) 22.3-6.8 MG/ML ophthalmic solution Place 1 drop into both eyes 3 (three) times daily.  Marland Kitchen  ezetimibe (ZETIA) 10 MG tablet Take 1  tablet (10 mg total) by mouth daily.  . famotidine (PEPCID) 20 MG tablet Take one pill one hour before bedtime (Patient taking differently: Take 20 mg by mouth at bedtime. )  . furosemide (LASIX) 40 MG tablet Take 1 tablet (40 mg total) by mouth daily.  Marland Kitchen gabapentin (NEURONTIN) 300 MG capsule Take 1 capsule (300 mg total) by mouth 3 (three) times daily.  . isosorbide-hydrALAZINE (BIDIL) 20-37.5 MG tablet Take 2 tablets by mouth 3 (three) times daily.  . metoprolol succinate (TOPROL-XL) 50 MG 24 hr tablet Take 1 tablet (50 mg total) by mouth daily. Take with or immediately following a meal.  . montelukast (SINGULAIR) 10 MG tablet Take 10 mg by mouth at bedtime.  . nitroGLYCERIN (NITROSTAT) 0.4 MG SL tablet Place 0.4 mg under the tongue every 5 (five) minutes as needed for chest pain.  Marland Kitchen nystatin (NYSTATIN) powder Apply to affected area tid prn  . pantoprazole (PROTONIX) 40 MG tablet Take 1 tablet (40 mg total) by mouth daily.  . ranolazine (RANEXA) 500 MG 12 hr tablet Take 2 tablets (1,000 mg total) by mouth 2 (two) times daily. (Patient taking differently: Take 500 mg by mouth 2 (two) times daily. )  . rosuvastatin (CRESTOR) 40 MG tablet Take 1 tablet (40 mg total) by mouth daily.  . sacubitril-valsartan (ENTRESTO) 97-103 MG Take 1 tablet by mouth 2 (two) times daily.  . sitaGLIPtin (JANUVIA) 100 MG tablet Take 1 tablet (100 mg total) by mouth daily.  Marland Kitchen spironolactone (ALDACTONE) 50 MG tablet TAKE 1 TABLET BY MOUTH EVERY DAY (Patient taking differently: Take 50 mg by mouth daily at 12 noon. )  . temazepam (RESTORIL) 15 MG capsule Take 15 mg by mouth at bedtime as needed for sleep.  . traMADol (ULTRAM) 50 MG tablet Take 1 tablet (50 mg total) by mouth every 6 (six) hours as needed.                   Objective:   Physical Exam  06/20/2019          145  05/09/2019            145  04/03/2019        140  08/09/13               150 06/15/2013  165  >06/23/2013 >163 06/23/2013 > 07/27/2013  157      amb wf with mild voice fatigue   Vital signs reviewed  06/20/2019  - Note at rest 02 sats  100% on RA    HEENT : pt wearing mask not removed for exam due to covid -19 concerns.    NECK :  without JVD/Nodes/TM/ nl carotid upstrokes bilaterally   LUNGS: no acc muscle use,  Nl contour chest which is clear to A and P bilaterally without cough on insp or exp maneuvers   CV:  RRR  no s3 or murmur or increase in P2, and no edema   ABD:  soft and nontender with nl inspiratory excursion in the supine position. No bruits or organomegaly appreciated, bowel sounds nl  MS:  Nl gait/ ext warm without deformities, calf tenderness, cyanosis or clubbing No obvious joint restrictions   SKIN: warm and dry without lesions    NEURO:  alert, approp, nl sensorium with  no motor or cerebellar deficits apparent.             Assessment & Plan:

## 2019-06-20 NOTE — Patient Instructions (Addendum)
Gabapentin 300 mg four times times daily - bfast lunch supper bedtime gradually building up  If not better on this the only options I can recommend are a trial off entresto (replace with alternative)  x 6 weeks then decide whether that helped and if not start back on it per dr Verdene Lennert.   GERD (REFLUX)  is an extremely common cause of respiratory symptoms just like yours , many times with no obvious heartburn at all.    It can be treated with medication, but also with lifestyle changes including elevation of the head of your bed (ideally with 6 -8inch blocks under the headboard of your bed),  Smoking cessation, avoidance of late meals, excessive alcohol, and avoid fatty foods, chocolate, peppermint, colas, red wine, and acidic juices such as orange juice.  NO MINT OR MENTHOL PRODUCTS SO NO COUGH DROPS  USE SUGARLESS CANDY INSTEAD (Jolley ranchers or Stover's or Life Savers) or even ice chips will also do - the key is to swallow to prevent all throat clearing. NO OIL BASED VITAMINS - use powdered substitutes.  Avoid fish oil when coughing.     Please schedule a follow up visit in 3 months but call sooner if needed  Add : make sure she understands not to change entresto rx without cards imput

## 2019-06-22 ENCOUNTER — Encounter: Payer: Self-pay | Admitting: Internal Medicine

## 2019-06-22 NOTE — Assessment & Plan Note (Signed)
05/27/2013 p extensive coaching HFA effectiveness =    75% - trial off dulera 3/88/7195 due to cyclical upper airway coughing > no change  - PFTs 07/27/13 wnl    No evidence of asthma at this point but hard to rule out  I spent extra time with pt today reviewing appropriate use of albuterol for prn use on exertion with the following points: 1) saba is for relief of sob that does not improve by walking a slower pace or resting but rather if the pt does not improve after trying this first. 2) If the pt is convinced, as many are, that saba helps recover from activity faster then it's easy to tell if this is the case by re-challenging : ie stop, take the inhaler, then p 5 minutes try the exact same activity (intensity of workload) that just caused the symptoms and see if they are substantially diminished or not after saba 3) if there is an activity that reproducibly causes the symptoms, try the saba 15 min before the activity on alternate days   If in fact the saba really does help, then fine to continue to use it prn but advised may need to look closer at the maintenance regimen being used to achieve better control of airways disease with exertion.

## 2019-06-22 NOTE — Assessment & Plan Note (Signed)
Onset around  2014 / ACEi intolerant  Dg Es 04/23/2012  . Nonspecific esophageal motility disorder (mild).  . Small sliding-type hiatal hernia. With Mild gastroesophageal reflux identified during the water siphon  test. - trial of neurontin 07/28/2013 > improved 08/09/13 so continue > pt d/c'd > restarted 04/03/2019 > increased to 300 mg tid 05/09/2019 > 06/22/2019 increased to 300 qid   If not improving on max gabapentin strongly advise trial off entresto before referring to [facility]/ Dr Joya Gaskins for uacs.         Each maintenance medication was reviewed in detail including emphasizing most importantly the difference between maintenance and prns and under what circumstances the prns are to be triggered using an action plan format where appropriate.  Total time for H and P, chart review, counseling, teaching device and generating customized AVS unique to this office visit / charting = 20 min

## 2019-07-21 ENCOUNTER — Ambulatory Visit (INDEPENDENT_AMBULATORY_CARE_PROVIDER_SITE_OTHER): Payer: Medicare HMO | Admitting: *Deleted

## 2019-07-21 DIAGNOSIS — I255 Ischemic cardiomyopathy: Secondary | ICD-10-CM

## 2019-07-21 LAB — CUP PACEART REMOTE DEVICE CHECK
Battery Remaining Longevity: 137 mo
Battery Voltage: 3.16 V
Brady Statistic RV Percent Paced: 0.01 %
Date Time Interrogation Session: 20210416001603
HighPow Impedance: 91 Ohm
Implantable Lead Implant Date: 20210115
Implantable Lead Location: 753860
Implantable Pulse Generator Implant Date: 20210115
Lead Channel Impedance Value: 513 Ohm
Lead Channel Impedance Value: 570 Ohm
Lead Channel Pacing Threshold Amplitude: 0.625 V
Lead Channel Pacing Threshold Pulse Width: 0.4 ms
Lead Channel Sensing Intrinsic Amplitude: 8.5 mV
Lead Channel Sensing Intrinsic Amplitude: 8.5 mV
Lead Channel Setting Pacing Amplitude: 3.5 V
Lead Channel Setting Pacing Pulse Width: 0.4 ms
Lead Channel Setting Sensing Sensitivity: 0.3 mV

## 2019-07-21 NOTE — Progress Notes (Signed)
ICD Remote  

## 2019-07-25 ENCOUNTER — Ambulatory Visit: Payer: Medicare HMO

## 2019-07-25 ENCOUNTER — Other Ambulatory Visit: Payer: Self-pay

## 2019-07-25 DIAGNOSIS — I6523 Occlusion and stenosis of bilateral carotid arteries: Secondary | ICD-10-CM

## 2019-07-26 ENCOUNTER — Ambulatory Visit (INDEPENDENT_AMBULATORY_CARE_PROVIDER_SITE_OTHER): Payer: Medicare HMO | Admitting: Internal Medicine

## 2019-07-26 ENCOUNTER — Encounter: Payer: Self-pay | Admitting: Internal Medicine

## 2019-07-26 ENCOUNTER — Ambulatory Visit (INDEPENDENT_AMBULATORY_CARE_PROVIDER_SITE_OTHER): Payer: Medicare HMO

## 2019-07-26 VITALS — BP 130/64 | HR 77 | Temp 97.9°F | Ht <= 58 in | Wt 145.8 lb

## 2019-07-26 DIAGNOSIS — I25119 Atherosclerotic heart disease of native coronary artery with unspecified angina pectoris: Secondary | ICD-10-CM | POA: Diagnosis not present

## 2019-07-26 DIAGNOSIS — E1122 Type 2 diabetes mellitus with diabetic chronic kidney disease: Secondary | ICD-10-CM | POA: Diagnosis not present

## 2019-07-26 DIAGNOSIS — R053 Chronic cough: Secondary | ICD-10-CM

## 2019-07-26 DIAGNOSIS — I130 Hypertensive heart and chronic kidney disease with heart failure and stage 1 through stage 4 chronic kidney disease, or unspecified chronic kidney disease: Secondary | ICD-10-CM

## 2019-07-26 DIAGNOSIS — R05 Cough: Secondary | ICD-10-CM

## 2019-07-26 DIAGNOSIS — I1310 Hypertensive heart and chronic kidney disease without heart failure, with stage 1 through stage 4 chronic kidney disease, or unspecified chronic kidney disease: Secondary | ICD-10-CM

## 2019-07-26 DIAGNOSIS — N182 Chronic kidney disease, stage 2 (mild): Secondary | ICD-10-CM

## 2019-07-26 DIAGNOSIS — I5022 Chronic systolic (congestive) heart failure: Secondary | ICD-10-CM | POA: Diagnosis not present

## 2019-07-26 DIAGNOSIS — Z0000 Encounter for general adult medical examination without abnormal findings: Secondary | ICD-10-CM | POA: Diagnosis not present

## 2019-07-26 LAB — POCT URINALYSIS DIPSTICK
Bilirubin, UA: NEGATIVE
Blood, UA: NEGATIVE
Glucose, UA: NEGATIVE
Ketones, UA: NEGATIVE
Nitrite, UA: NEGATIVE
Protein, UA: NEGATIVE
Spec Grav, UA: 1.025 (ref 1.010–1.025)
Urobilinogen, UA: 0.2 E.U./dL
pH, UA: 5.5 (ref 5.0–8.0)

## 2019-07-26 LAB — POCT UA - MICROALBUMIN
Albumin/Creatinine Ratio, Urine, POC: <30
Creatinine, POC: 300 mg/dL
Microalbumin Ur, POC: 30 mg/L

## 2019-07-26 NOTE — Progress Notes (Signed)
This visit occurred during the SARS-CoV-2 public health emergency.  Safety protocols were in place, including screening questions prior to the visit, additional usage of staff PPE, and extensive cleaning of exam room while observing appropriate contact time as indicated for disinfecting solutions.  Subjective:   Lynn Carey is a 79 y.o. female who presents for Medicare Annual (Subsequent) preventive examination.  Review of Systems:  n/a Cardiac Risk Factors include: advanced age (>16mn, >>39women);diabetes mellitus;dyslipidemia;hypertension;sedentary lifestyle;obesity (BMI >30kg/m2)     Objective:     Vitals: BP 130/64 (BP Location: Left Arm, Patient Position: Sitting, Cuff Size: Normal)   Pulse 77   Temp 97.9 F (36.6 C) (Oral)   Ht 4' 9.8" (1.468 m)   Wt 145 lb 12.8 oz (66.1 kg)   SpO2 97%   BMI 30.68 kg/m   Body mass index is 30.68 kg/m.  Advanced Directives 07/26/2019 04/21/2019 07/21/2018 04/29/2018 06/18/2017 02/05/2015 01/22/2014  Does Patient Have a Medical Advance Directive? Yes Yes Yes Yes No Yes Yes  Type of AParamedicof AThree RiversLiving will Healthcare Power of AIndian Springs VillageLiving will HRices LandingLiving will - - -  Does patient want to make changes to medical advance directive? - - - No - Patient declined - No - Patient declined -  Copy of [place]in Chart? Yes - validated most recent copy scanned in chart (See row information) - No - copy requested No - copy requested - Yes -  Would patient like information on creating a medical advance directive? - - - - No - Patient declined - -  Pre-existing out of facility DNR order (yellow form or pink MOST form) - - - - - - -    Tobacco Social History   Tobacco Use  Smoking Status Former Smoker  . Packs/day: 0.50  . Years: 2.00  . Pack years: 1.00  . Types: Cigarettes  . Quit date: 12/08/1983  . Years since quitting: 35.6  Smokeless  Tobacco Never Used     Counseling given: Not Answered   Clinical Intake:  Pre-visit preparation completed: Yes  Pain : No/denies pain     Nutritional Status: BMI > 30  Obese Nutritional Risks: None Diabetes: Yes  How often do you need to have someone help you when you read instructions, pamphlets, or other written materials from your doctor or pharmacy?: 1 - Never What is the last grade level you completed in school?: 2 yrs college  Interpreter Needed?: No  Information entered by :: NAllen LPN  Past Medical History:  Diagnosis Date  . Anginal pain ([place]    h/o, denies today   . Arthritis    hip - both, shot in L shoulder- 2 weeks ago  . Asthma   . Carotid artery occlusion   . CHF (congestive heart failure) ([place]   . Complication of anesthesia   . Coronary artery disease   . Diabetes mellitus without complication ([place]   . GERD (gastroesophageal reflux disease)   . Hypertension    followed by Dr. GEinar Gip . MYOCARDIAL INFARCTION 05/25/2007   Qualifier: History of  By: JMilana Obey EDoroteo Bradford   . Myocardial infarction ([place] 1997  . PONV (postoperative nausea and vomiting)   . Shortness of breath    Past Surgical History:  Procedure Laterality Date  . CARDIAC CATHETERIZATION  05/2012   see note  . CORONARY ARTERY BYPASS GRAFT  [address] . CORONARY STENT  INTERVENTION Right 06/18/2017   Procedure: CORONARY STENT INTERVENTION;  Surgeon: Adrian Prows, MD;  Location: [facility];  Service: Cardiovascular;  Laterality: Right;  . EYE SURGERY     both eyes, laser procedure  . ICD IMPLANT N/A 04/21/2019   Procedure: ICD IMPLANT;  Surgeon: Deboraha Sprang, MD;  Location: [facility];  Service: Cardiovascular;  Laterality: N/A;  . JOINT REPLACEMENT Left 12/13/12   hip  . LEFT HEART CATH AND CORONARY ANGIOGRAPHY N/A 11/25/2017   Procedure: LEFT HEART CATH AND CORONARY ANGIOGRAPHY;  Surgeon: [name], MD;  Location: [facility];  Service:  Cardiovascular;  Laterality: N/A;  . LEFT HEART CATH AND CORS/GRAFTS ANGIOGRAPHY N/A 06/18/2017   Procedure: LEFT HEART CATH AND CORS/GRAFTS ANGIOGRAPHY;  Surgeon: Adrian Prows, MD;  Location: [facility];  Service: Cardiovascular;  Laterality: N/A;  . LEFT HEART CATHETERIZATION WITH CORONARY/GRAFT ANGIOGRAM N/A 04/21/2011   Procedure: LEFT HEART CATHETERIZATION WITH Beatrix Fetters;  Surgeon: Laverda Page, MD;  Location: [facility];  Service: Cardiovascular;  Laterality: N/A;  . PERCUTANEOUS CORONARY INTERVENTION-BALLOON ONLY Right 05/12/2011   Procedure: PERCUTANEOUS CORONARY INTERVENTION-BALLOON ONLY;  Surgeon: Laverda Page, MD;  Location[facility] C[facility];  Service: Cardiovascular;  Laterality: Right;  . TOTAL HIP ARTHROPLASTY Left 12/13/2012   Procedure: TOTAL HIP ARTHROPLASTY ANTERIOR APPROACH;  Surgeon: Hessie Dibble, MD;  Location: [facility];  Service: Orthopedics;  Laterality: Left;  . TOTAL HIP ARTHROPLASTY Right 01/30/2014   Procedure: TOTAL HIP ARTHROPLASTY ANTERIOR APPROACH;  Surgeon: Hessie Dibble, MD;  Location: [facility];  Service: Orthopedics;  Laterality: Right;  . TUBAL LIGATION     Family History  Problem Relation Age of Onset  . Allergies Mother   . Asthma Mother   . Heart disease Mother   . Heart attack Mother   . Allergies Son   . Asthma Son   . Heart disease Father   . Heart attack Father   . Breast cancer Daughter 67   Social History   Socioeconomic History  . Marital status: Divorced    Spouse name: Not on file  . Number of children: 4  . Years of education: Not on file  . Highest education level: Not on file  Occupational History  . Not on file  Tobacco Use  . Smoking status: Former Smoker    Packs/day: 0.50    Years: 2.00    Pack years: 1.00    Types: Cigarettes    Quit date: 12/08/1983    Years since quitting: 35.6  . Smokeless tobacco: Never Used  Substance and Sexual Activity  . Alcohol use: No  . Drug use: No  . Sexual activity:  Not Currently  Other Topics Concern  . Not on file  Social History Narrative  . Not on file   Social Determinants of Health   Financial Resource Strain: Low Risk   . Difficulty of Paying Living Expenses: Not hard at all  Food Insecurity: No Food Insecurity  . Worried About Charity fundraiser in the Last Year: Never true  . Ran Out of Food in the Last Year: Never true  Transportation Needs: No Transportation Needs  . Lack of Transportation (Medical): No  . Lack of Transportation (Non-Medical): No  Physical Activity: Insufficiently Active  . Days of Exercise per Week: 3 days  . Minutes of Exercise per Session: 10 min  Stress: No Stress Concern Present  . Feeling of Stress : Not at  all  Social Connections:   . Frequency of Communication with Friends and Family:   . Frequency of Social Gatherings with Friends and Family:   . Attends Religious Services:   . Active Member of Clubs or Organizations:   . Attends Archivist Meetings:   Marland Kitchen Marital Status:     Outpatient Encounter Medications as of 07/26/2019  Medication Sig  . acetaminophen (TYLENOL) 650 MG CR tablet Take 650 mg by mouth every 8 (eight) hours as needed for pain.  Marland Kitchen albuterol (VENTOLIN HFA) 108 (90 Base) MCG/ACT inhaler Inhale 2 puffs into the lungs every 4 (four) hours as needed for wheezing or shortness of breath.  Marland Kitchen aspirin EC 81 MG tablet Take 1 tablet (81 mg total) by mouth daily. (Patient taking differently: Take 81 mg by mouth every evening. )  . Cholecalciferol (VITAMIN D) 2000 UNITS CAPS Take 2,000 Units by mouth daily.   . clopidogrel (PLAVIX) 75 MG tablet Take 1 tablet (75 mg total) by mouth daily.  . dorzolamide-timolol (COSOPT) 22.3-6.8 MG/ML ophthalmic solution Place 1 drop into both eyes 3 (three) times daily.  Marland Kitchen ezetimibe (ZETIA) 10 MG tablet Take 1 tablet (10 mg total) by mouth daily.  . famotidine (PEPCID) 20 MG tablet Take one pill one hour before bedtime (Patient taking differently: Take 20 mg  by mouth at bedtime. )  . furosemide (LASIX) 40 MG tablet Take 1 tablet (40 mg total) by mouth daily.  Marland Kitchen gabapentin (NEURONTIN) 300 MG capsule Take 1 capsule (300 mg total) by mouth 4 (four) times daily.  . isosorbide-hydrALAZINE (BIDIL) 20-37.5 MG tablet Take 2 tablets by mouth 3 (three) times daily.  . metoprolol succinate (TOPROL-XL) 50 MG 24 hr tablet Take 1 tablet (50 mg total) by mouth daily. Take with or immediately following a meal.  . montelukast (SINGULAIR) 10 MG tablet Take 10 mg by mouth at bedtime.  . nitroGLYCERIN (NITROSTAT) 0.4 MG SL tablet Place 0.4 mg under the tongue every 5 (five) minutes as needed for chest pain.  Marland Kitchen nystatin (NYSTATIN) powder Apply to affected area tid prn  . pantoprazole (PROTONIX) 40 MG tablet Take 1 tablet (40 mg total) by mouth daily.  . ranolazine (RANEXA) 500 MG 12 hr tablet Take 2 tablets (1,000 mg total) by mouth 2 (two) times daily. (Patient taking differently: Take 500 mg by mouth 2 (two) times daily. )  . rosuvastatin (CRESTOR) 40 MG tablet Take 1 tablet (40 mg total) by mouth daily.  . sacubitril-valsartan (ENTRESTO) 97-103 MG Take 1 tablet by mouth 2 (two) times daily.  . sitaGLIPtin (JANUVIA) 100 MG tablet Take 1 tablet (100 mg total) by mouth daily.  Marland Kitchen spironolactone (ALDACTONE) 50 MG tablet TAKE 1 TABLET BY MOUTH EVERY DAY (Patient taking differently: Take 50 mg by mouth daily at 12 noon. )  . temazepam (RESTORIL) 15 MG capsule Take 15 mg by mouth at bedtime as needed for sleep.  . traMADol (ULTRAM) 50 MG tablet Take 1 tablet (50 mg total) by mouth every 6 (six) hours as needed.  Marland Kitchen amLODipine (NORVASC) 2.5 MG tablet Take 1 tablet (2.5 mg total) by mouth daily.   No facility-administered encounter medications on file as of 07/26/2019.    Activities of Daily Living In your present state of health, do you have any difficulty performing the following activities: 07/26/2019  Hearing? N  Vision? N  Difficulty concentrating or making decisions? N    Walking or climbing stairs? Y  Comment SOB  Dressing or bathing? N  Doing errands, shopping? N  Preparing Food and eating ? N  Using the Toilet? N  In the past six months, have you accidently leaked urine? N  Do you have problems with loss of bowel control? N  Managing your Medications? N  Managing your Finances? N  Housekeeping or managing your Housekeeping? N  Some recent data might be hidden    Patient Care Team: Glendale Chard, MD as PCP - General (Internal Medicine)    Assessment:   This is a routine wellness examination for Salwa.  Exercise Activities and Dietary recommendations Current Exercise Habits: Home exercise routine, Type of exercise: walking, Time (Minutes): 10, Frequency (Times/Week): 3, Weekly Exercise (Minutes/Week): 30  Goals    . Patient Stated     States already met    . Patient Stated     07/26/2019, no goals       Fall Risk Fall Risk  07/26/2019 11/30/2018 09/27/2018 07/21/2018 07/21/2018  Falls in the past year? 0 '1 1 1 1  '$ Number falls in past yr: - 1 0 0 0  Comment - - - tripped -  Injury with Fall? - 1 1 0 0  Risk for fall due to : Medication side effect - - History of fall(s);Medication side effect -  Follow up Falls evaluation completed;Education provided;Falls prevention discussed - - Education provided;Falls prevention discussed -   Is the patient's home free of loose throw rugs in walkways, pet beds, electrical cords, etc?   yes      Grab bars in the bathroom? yes      Handrails on the stairs?   yes      Adequate lighting?   yes  Timed Get Up and Go performed: n/a  Depression Screen PHQ 2/9 Scores 07/26/2019 09/27/2018 07/21/2018 06/17/2018  PHQ - 2 Score 0 0 0 1  PHQ- 9 Score 0 - - -     Cognitive Function     6CIT Screen 07/26/2019 07/21/2018  What Year? 0 points 0 points  What month? 0 points 0 points  What time? 0 points 0 points  Count back from 20 0 points 0 points  Months in reverse 0 points 2 points  Repeat phrase 0 points  0 points  Total Score 0 2    Immunization History  Administered Date(s) Administered  . Fluad Quad(high Dose 65+) 11/30/2018  . Influenza, High Dose Seasonal PF 11/30/2018  . Influenza,inj,Quad PF,6+ Mos 12/16/2012, 02/01/2014  . Influenza-Unspecified 12/13/2017  . PFIZER SARS-COV-2 Vaccination 05/18/2019, 06/10/2019  . Pneumococcal Conjugate-13 04/03/2019, 04/03/2019  . Pneumococcal Polysaccharide-23 12/16/2012    Qualifies for Shingles Vaccine? yes  Screening Tests Health Maintenance  Topic Date Due  . OPHTHALMOLOGY EXAM  07/22/2018  . URINE MICROALBUMIN  07/21/2019  . TETANUS/TDAP  07/25/2020 (Originally 11/17/1959)  . HEMOGLOBIN A1C  09/26/2019  . INFLUENZA VACCINE  11/05/2019  . FOOT EXAM  11/30/2019  . DEXA SCAN  Completed  . COVID-19 Vaccine  Completed  . PNA vac Low Risk Adult  Completed    Cancer Screenings: Lung: Low Dose CT Chest recommended if Age 35-80 years, 30 pack-year currently smoking OR have quit w/in 15years. Patient does not qualify. Breast:  Up to date on Mammogram? Yes   Up to date of Bone Density/Dexa? Yes Colorectal: not required  Additional Screenings: : Hepatitis C Screening: n/a     Plan:    Patient has no goals set at this time.   I have personally reviewed and noted the following in the  patient's chart:   . Medical and social history . Use of alcohol, tobacco or illicit drugs  . Current medications and supplements . Functional ability and status . Nutritional status . Physical activity . Advanced directives . List of other physicians . Hospitalizations, surgeries, and ER visits in previous 12 months . Vitals . Screenings to include cognitive, depression, and falls . Referrals and appointments  In addition, I have reviewed and discussed with patient certain preventive protocols, quality metrics, and best practice recommendations. A written personalized care plan for preventive services as well as general preventive health  recommendations were provided to patient.     Kellie Simmering, LPN  [id number]

## 2019-07-26 NOTE — Patient Instructions (Signed)
[name] , Thank you for taking time to come for your Medicare Wellness Visit. I appreciate your ongoing commitment to your health goals. Please review the following plan we discussed and let me know if I can assist you in the future.   Screening recommendations/referrals: Colonoscopy: not required Mammogram: 10/2018 Bone Density: 12/2014 Recommended yearly ophthalmology/optometry visit for glaucoma screening and checkup Recommended yearly dental visit for hygiene and checkup  Vaccinations: Influenza vaccine: 11/2018 Pneumococcal vaccine: 03/2019 Tdap vaccine: hold due to covid vaccine Shingles vaccine: discussed    Advanced directives: copy in chart  Conditions/risks identified: obesity  Next appointment: 12/12/2019 at 8:45   Preventive Care 36 Years and Older, Female Preventive care refers to lifestyle choices and visits with your health care provider that can promote health and wellness. What does preventive care include?  A yearly physical exam. This is also called an annual well check.  Dental exams once or twice a year.  Routine eye exams. Ask your health care provider how often you should have your eyes checked.  Personal lifestyle choices, including:  Daily care of your teeth and gums.  Regular physical activity.  Eating a healthy diet.  Avoiding tobacco and drug use.  Limiting alcohol use.  Practicing safe sex.  Taking low-dose aspirin every day.  Taking vitamin and mineral supplements as recommended by your health care provider. What happens during an annual well check? The services and screenings done by your health care provider during your annual well check will depend on your age, overall health, lifestyle risk factors, and family history of disease. Counseling  Your health care provider may ask you questions about your:  Alcohol use.  Tobacco use.  Drug use.  Emotional well-being.  Home and relationship well-being.  Sexual  activity.  Eating habits.  History of falls.  Memory and ability to understand (cognition).  Work and work Statistician.  Reproductive health. Screening  You may have the following tests or measurements:  Height, weight, and BMI.  Blood pressure.  Lipid and cholesterol levels. These may be checked every 5 years, or more frequently if you are over 31 years old.  Skin check.  Lung cancer screening. You may have this screening every year starting at age 83 if you have a 30-pack-year history of smoking and currently smoke or have quit within the past 15 years.  Fecal occult blood test (FOBT) of the stool. You may have this test every year starting at age 11.  Flexible sigmoidoscopy or colonoscopy. You may have a sigmoidoscopy every 5 years or a colonoscopy every 10 years starting at age 71.  Hepatitis C blood test.  Hepatitis B blood test.  Sexually transmitted disease (STD) testing.  Diabetes screening. This is done by checking your blood sugar (glucose) after you have not eaten for a while (fasting). You may have this done every 1-3 years.  Bone density scan. This is done to screen for osteoporosis. You may have this done starting at age 64.  Mammogram. This may be done every 1-2 years. Talk to your health care provider about how often you should have regular mammograms. Talk with your health care provider about your test results, treatment options, and if necessary, the need for more tests. Vaccines  Your health care provider may recommend certain vaccines, such as:  Influenza vaccine. This is recommended every year.  Tetanus, diphtheria, and acellular pertussis (Tdap, Td) vaccine. You may need a Td booster every 10 years.  Zoster vaccine. You may need this after  age 61.  Pneumococcal 13-valent conjugate (PCV13) vaccine. One dose is recommended after age 34.  Pneumococcal polysaccharide (PPSV23) vaccine. One dose is recommended after age 7. Talk to your health care  provider about which screenings and vaccines you need and how often you need them. This information is not intended to replace advice given to you by your health care provider. Make sure you discuss any questions you have with your health care provider. Document Released: 04/19/2015 Document Revised: 12/11/2015 Document Reviewed: 01/22/2015 Elsevier Interactive Patient Education  2017 [place] Prevention in the Home Falls can cause injuries. They can happen to people of all ages. There are many things you can do to make your home safe and to help prevent falls. What can I do on the outside of my home?  Regularly fix the edges of walkways and driveways and fix any cracks.  Remove anything that might make you trip as you walk through a door, such as a raised step or threshold.  Trim any bushes or trees on the path to your home.  Use bright outdoor lighting.  Clear any walking paths of anything that might make someone trip, such as rocks or tools.  Regularly check to see if handrails are loose or broken. Make sure that both sides of any steps have handrails.  Any raised decks and porches should have guardrails on the edges.  Have any leaves, snow, or ice cleared regularly.  Use sand or salt on walking paths during winter.  Clean up any spills in your garage right away. This includes oil or grease spills. What can I do in the bathroom?  Use night lights.  Install grab bars by the toilet and in the tub and shower. Do not use towel bars as grab bars.  Use non-skid mats or decals in the tub or shower.  If you need to sit down in the shower, use a plastic, non-slip stool.  Keep the floor dry. Clean up any water that spills on the floor as soon as it happens.  Remove soap buildup in the tub or shower regularly.  Attach bath mats securely with double-sided non-slip rug tape.  Do not have throw rugs and other things on the floor that can make you trip. What can I do in  the bedroom?  Use night lights.  Make sure that you have a light by your bed that is easy to reach.  Do not use any sheets or blankets that are too big for your bed. They should not hang down onto the floor.  Have a firm chair that has side arms. You can use this for support while you get dressed.  Do not have throw rugs and other things on the floor that can make you trip. What can I do in the kitchen?  Clean up any spills right away.  Avoid walking on wet floors.  Keep items that you use a lot in easy-to-reach places.  If you need to reach something above you, use a strong step stool that has a grab bar.  Keep electrical cords out of the way.  Do not use floor polish or wax that makes floors slippery. If you must use wax, use non-skid floor wax.  Do not have throw rugs and other things on the floor that can make you trip. What can I do with my stairs?  Do not leave any items on the stairs.  Make sure that there are handrails on both sides of the stairs  and use them. Fix handrails that are broken or loose. Make sure that handrails are as long as the stairways.  Check any carpeting to make sure that it is firmly attached to the stairs. Fix any carpet that is loose or worn.  Avoid having throw rugs at the top or bottom of the stairs. If you do have throw rugs, attach them to the floor with carpet tape.  Make sure that you have a light switch at the top of the stairs and the bottom of the stairs. If you do not have them, ask someone to add them for you. What else can I do to help prevent falls?  Wear shoes that:  Do not have high heels.  Have rubber bottoms.  Are comfortable and fit you well.  Are closed at the toe. Do not wear sandals.  If you use a stepladder:  Make sure that it is fully opened. Do not climb a closed stepladder.  Make sure that both sides of the stepladder are locked into place.  Ask someone to hold it for you, if possible.  Clearly mark and  make sure that you can see:  Any grab bars or handrails.  First and last steps.  Where the edge of each step is.  Use tools that help you move around (mobility aids) if they are needed. These include:  Canes.  Walkers.  Scooters.  Crutches.  Turn on the lights when you go into a dark area. Replace any light bulbs as soon as they burn out.  Set up your furniture so you have a clear path. Avoid moving your furniture around.  If any of your floors are uneven, fix them.  If there are any pets around you, be aware of where they are.  Review your medicines with your doctor. Some medicines can make you feel dizzy. This can increase your chance of falling. Ask your doctor what other things that you can do to help prevent falls. This information is not intended to replace advice given to you by your health care provider. Make sure you discuss any questions you have with your health care provider. Document Released: 01/17/2009 Document Revised: 08/29/2015 Document Reviewed: 04/27/2014 Elsevier Interactive Patient Education  2017 [employer].

## 2019-07-26 NOTE — Progress Notes (Signed)
This visit occurred during the SARS-CoV-2 public health emergency.  Safety protocols were in place, including screening questions prior to the visit, additional usage of staff PPE, and extensive cleaning of exam room while observing appropriate contact time as indicated for disinfecting solutions.  Subjective:     Patient ID: Lynn Carey , female    DOB: January 24, 1941 , 79 y.o.   MRN: [id number]   Chief Complaint  Patient presents with  . Diabetes  . Hypertension    HPI  She presents today for DM/BP check. She reports compliance with meds.   Diabetes She presents for her follow-up diabetic visit. She has type 2 diabetes mellitus. Her disease course has been stable. There are no hypoglycemic associated symptoms. Pertinent negatives for diabetes include no blurred vision and no chest pain. There are no hypoglycemic complications. Diabetic complications include heart disease and nephropathy. Risk factors for coronary artery disease include diabetes mellitus, dyslipidemia, hypertension, post-menopausal, sedentary lifestyle and obesity. She is compliant with treatment all of the time. She is following a diabetic diet. She participates in exercise intermittently. Her breakfast blood glucose is taken between 8-9 am. Her breakfast blood glucose range is generally 90-110 mg/dl. An ACE inhibitor/angiotensin II receptor blocker is being taken. She does not see a podiatrist.Eye exam is current.  Hypertension This is a chronic problem. The current episode started more than 1 year ago. The problem has been rapidly improving since onset. The problem is controlled. Pertinent negatives include no blurred vision, chest pain, palpitations or shortness of breath.     Past Medical History:  Diagnosis Date  . Anginal pain ([place])    h/o, denies today   . Arthritis    hip - both, shot in L shoulder- 2 weeks ago  . Asthma   . Carotid artery occlusion   . CHF (congestive heart failure) ([place])   . Complication  of anesthesia   . Coronary artery disease   . Diabetes mellitus without complication ([place])   . GERD (gastroesophageal reflux disease)   . Hypertension    followed by Dr. Einar Gip  . MYOCARDIAL INFARCTION 05/25/2007   Qualifier: History of  By: Milana Obey, Doroteo Bradford    . Myocardial infarction ([place]) 1997  . PONV (postoperative nausea and vomiting)   . Shortness of breath      Family History  Problem Relation Age of Onset  . Allergies Mother   . Asthma Mother   . Heart disease Mother   . Heart attack Mother   . Allergies Son   . Asthma Son   . Heart disease Father   . Heart attack Father   . Breast cancer Daughter 76     Current Outpatient Medications:  .  acetaminophen (TYLENOL) 650 MG CR tablet, Take 650 mg by mouth every 8 (eight) hours as needed for pain., Disp: , Rfl:  .  albuterol (VENTOLIN HFA) 108 (90 Base) MCG/ACT inhaler, Inhale 2 puffs into the lungs every 4 (four) hours as needed for wheezing or shortness of breath., Disp: 6.7 g, Rfl: 3 .  amLODipine (NORVASC) 2.5 MG tablet, Take 1 tablet (2.5 mg total) by mouth daily., Disp: 30 tablet, Rfl: 5 .  aspirin EC 81 MG tablet, Take 1 tablet (81 mg total) by mouth daily. (Patient taking differently: Take 81 mg by mouth every evening. ), Disp: 90 tablet, Rfl: 3 .  Cholecalciferol (VITAMIN D) 2000 UNITS CAPS, Take 2,000 Units by mouth daily. , Disp: , Rfl:  .  clopidogrel (PLAVIX) 75 MG  tablet, Take 1 tablet (75 mg total) by mouth daily., Disp: 90 tablet, Rfl: 2 .  dorzolamide-timolol (COSOPT) 22.3-6.8 MG/ML ophthalmic solution, Place 1 drop into both eyes 3 (three) times daily., Disp: , Rfl:  .  ezetimibe (ZETIA) 10 MG tablet, Take 1 tablet (10 mg total) by mouth daily., Disp: 90 tablet, Rfl: 2 .  famotidine (PEPCID) 20 MG tablet, Take one pill one hour before bedtime (Patient taking differently: Take 20 mg by mouth at bedtime. ), Disp: 30 tablet, Rfl: 11 .  furosemide (LASIX) 40 MG tablet, Take 1 tablet (40 mg total) by mouth daily.,  Disp: 90 tablet, Rfl: 3 .  gabapentin (NEURONTIN) 300 MG capsule, Take 1 capsule (300 mg total) by mouth 4 (four) times daily., Disp: 120 capsule, Rfl: 2 .  isosorbide-hydrALAZINE (BIDIL) 20-37.5 MG tablet, Take 2 tablets by mouth 3 (three) times daily., Disp: 15 tablet, Rfl: 0 .  metoprolol succinate (TOPROL-XL) 50 MG 24 hr tablet, Take 1 tablet (50 mg total) by mouth daily. Take with or immediately following a meal., Disp: 30 tablet, Rfl: 2 .  montelukast (SINGULAIR) 10 MG tablet, Take 10 mg by mouth at bedtime., Disp: , Rfl:  .  nitroGLYCERIN (NITROSTAT) 0.4 MG SL tablet, Place 0.4 mg under the tongue every 5 (five) minutes as needed for chest pain., Disp: , Rfl:  .  nystatin (NYSTATIN) powder, Apply to affected area tid prn, Disp: 60 g, Rfl: 0 .  pantoprazole (PROTONIX) 40 MG tablet, Take 1 tablet (40 mg total) by mouth daily., Disp: 90 tablet, Rfl: 1 .  ranolazine (RANEXA) 500 MG 12 hr tablet, Take 2 tablets (1,000 mg total) by mouth 2 (two) times daily. (Patient taking differently: Take 500 mg by mouth 2 (two) times daily. ), Disp: 60 tablet, Rfl: 6 .  rosuvastatin (CRESTOR) 40 MG tablet, Take 1 tablet (40 mg total) by mouth daily., Disp: 90 tablet, Rfl: 2 .  sacubitril-valsartan (ENTRESTO) 97-103 MG, Take 1 tablet by mouth 2 (two) times daily., Disp: 120 tablet, Rfl: 3 .  sitaGLIPtin (JANUVIA) 100 MG tablet, Take 1 tablet (100 mg total) by mouth daily., Disp: 90 tablet, Rfl: 3 .  spironolactone (ALDACTONE) 50 MG tablet, TAKE 1 TABLET BY MOUTH EVERY DAY (Patient taking differently: Take 50 mg by mouth daily at 12 noon. ), Disp: 90 tablet, Rfl: 2 .  temazepam (RESTORIL) 15 MG capsule, Take 15 mg by mouth at bedtime as needed for sleep., Disp: , Rfl: 1 .  traMADol (ULTRAM) 50 MG tablet, Take 1 tablet (50 mg total) by mouth every 6 (six) hours as needed., Disp: 20 tablet, Rfl: 0   Allergies  Allergen Reactions  . Lisinopril Cough     Review of Systems  Constitutional: Negative.   Eyes:  Negative for blurred vision.  Respiratory: Positive for cough. Negative for shortness of breath.   Cardiovascular: Negative.  Negative for chest pain and palpitations.  Gastrointestinal: Negative.   Neurological: Negative.   Psychiatric/Behavioral: Negative.      Today's Vitals   07/26/19 0857  BP: 130/64  Pulse: 77  Temp: 97.9 F (36.6 C)  Weight: 145 lb 12.8 oz (66.1 kg)  Height: 4' 9.8" (1.468 m)   Body mass index is 30.68 kg/m.   Objective:  Physical Exam Vitals and nursing note reviewed.  Constitutional:      Appearance: Normal appearance. She is obese.  HENT:     Head: Normocephalic and atraumatic.  Cardiovascular:     Rate and Rhythm: Normal rate and  regular rhythm.     Heart sounds: Normal heart sounds.  Pulmonary:     Effort: Pulmonary effort is normal.     Breath sounds: Normal breath sounds.     Comments: Decreased breath sounds at bases Skin:    General: Skin is warm.  Neurological:     General: No focal deficit present.     Mental Status: She is alert.  Psychiatric:        Mood and Affect: Mood normal.        Behavior: Behavior normal.         Assessment And Plan:     1. Type 2 diabetes mellitus with stage 2 chronic kidney disease, without long-term current use of insulin ([place])  I will check labs as listed below. I will adjust meds as needed.  Previous hba1c results reviewed, she was congratulated on her great DM control.   - CMP14+EGFR - CBC - Lipid panel - Hemoglobin A1c - TSH  2. Benign hypertensive heart and renal disease  Chronic, fair control. She will continue with current meds. She is encouraged to avoid adding salt to her foods.   3. Atherosclerosis of native coronary artery of native heart with angina pectoris (HCC)  Chronic, also followed by Cardiology.   4. Chronic cough  Chronic. She uses cough syrup as needed. Cause is multifactorial.   5. Chronic systolic heart failure (HCC)  Chronic, she appears to be volume  controlled. She is encouraged to limit her salt intake.   Maximino Greenland, MD    THE PATIENT IS ENCOURAGED TO PRACTICE SOCIAL DISTANCING DUE TO THE COVID-19 PANDEMIC.

## 2019-07-26 NOTE — Addendum Note (Signed)
Addended by: Glenna Durand E on: 07/26/2019 10:15 AM   Modules accepted: Orders

## 2019-07-27 LAB — CMP14+EGFR
ALT: 11 IU/L (ref 0–32)
AST: 18 IU/L (ref 0–40)
Albumin/Globulin Ratio: 1.6 (ref 1.2–2.2)
Albumin: 4.9 g/dL — ABNORMAL HIGH (ref 3.7–4.7)
Alkaline Phosphatase: 87 IU/L (ref 39–117)
BUN/Creatinine Ratio: 17 (ref 12–28)
BUN: 23 mg/dL (ref 8–27)
Bilirubin Total: 0.4 mg/dL (ref 0.0–1.2)
CO2: 25 mmol/L (ref 20–29)
Calcium: 10 mg/dL (ref 8.7–10.3)
Chloride: 102 mmol/L (ref 96–106)
Creatinine, Ser: 1.33 mg/dL — ABNORMAL HIGH (ref 0.57–1.00)
GFR calc Af Amer: 44 mL/min/{1.73_m2} — ABNORMAL LOW (ref >59)
GFR calc non Af Amer: 38 mL/min/{1.73_m2} — ABNORMAL LOW (ref >59)
Globulin, Total: 3 g/dL (ref 1.5–4.5)
Glucose: 77 mg/dL (ref 65–99)
Potassium: 4.7 mmol/L (ref 3.5–5.2)
Sodium: 143 mmol/L (ref 134–144)
Total Protein: 7.9 g/dL (ref 6.0–8.5)

## 2019-07-27 LAB — LIPID PANEL
Chol/HDL Ratio: 2.9 ratio (ref 0.0–4.4)
Cholesterol, Total: 159 mg/dL (ref 100–199)
HDL: 55 mg/dL (ref >39)
LDL Chol Calc (NIH): 81 mg/dL (ref 0–99)
Triglycerides: 134 mg/dL (ref 0–149)
VLDL Cholesterol Cal: 23 mg/dL (ref 5–40)

## 2019-07-27 LAB — CBC
Hematocrit: 40.4 % (ref 34.0–46.6)
Hemoglobin: 13.1 g/dL (ref 11.1–15.9)
MCH: 31.4 pg (ref 26.6–33.0)
MCHC: 32.4 g/dL (ref 31.5–35.7)
MCV: 97 fL (ref 79–97)
Platelets: 273 10*3/uL (ref 150–450)
RBC: 4.17 x10E6/uL (ref 3.77–5.28)
RDW: 12.3 % (ref 11.7–15.4)
WBC: 6.9 10*3/uL (ref 3.4–10.8)

## 2019-07-27 LAB — HEMOGLOBIN A1C
Est. average glucose Bld gHb Est-mCnc: 114 mg/dL
Hgb A1c MFr Bld: 5.6 % (ref 4.8–5.6)

## 2019-07-27 LAB — TSH: TSH: 2.18 u[IU]/mL (ref 0.450–4.500)

## 2019-07-28 ENCOUNTER — Other Ambulatory Visit: Payer: Self-pay

## 2019-07-28 ENCOUNTER — Telehealth: Payer: Self-pay

## 2019-07-28 MED ORDER — SITAGLIPTIN PHOSPHATE 50 MG PO TABS: 50.0000 mg | ORAL_TABLET | Freq: Every day | ORAL | 1 refills | Status: AC

## 2019-07-28 NOTE — Telephone Encounter (Signed)
-----   Message from Glendale Chard, MD sent at 07/28/2019 10:57 AM EDT ----- Blood count, kidneys stable. Thyroid fxn stable. A1c is 5.6, it is perfect! I think we need to decrease Januvia to 50mg  daily. TB-pls send prescription to pharmacy. She may take her 100mg  MWF until she runs out.

## 2019-07-28 NOTE — Telephone Encounter (Signed)
Unable to leave vm due to vm being full

## 2019-07-30 ENCOUNTER — Other Ambulatory Visit: Payer: Self-pay | Admitting: Cardiology

## 2019-07-30 DIAGNOSIS — I6523 Occlusion and stenosis of bilateral carotid arteries: Secondary | ICD-10-CM

## 2019-07-31 ENCOUNTER — Other Ambulatory Visit: Payer: Self-pay

## 2019-08-01 ENCOUNTER — Other Ambulatory Visit: Payer: Medicare HMO

## 2019-08-01 NOTE — Progress Notes (Signed)
I will speak to Dr. Einar Gip regarding this patient.

## 2019-08-02 ENCOUNTER — Telehealth: Payer: Self-pay | Admitting: *Deleted

## 2019-08-02 ENCOUNTER — Other Ambulatory Visit: Payer: Self-pay | Admitting: Cardiology

## 2019-08-02 DIAGNOSIS — I5022 Chronic systolic (congestive) heart failure: Secondary | ICD-10-CM

## 2019-08-02 NOTE — Telephone Encounter (Signed)
Received request from Dr. Irven Shelling office to release patient in [place].  Attempted to reach patient. No answer, VM full so unable to LM.

## 2019-08-02 NOTE — Progress Notes (Signed)
I have already sent a request for transfer from [facility].

## 2019-08-03 ENCOUNTER — Telehealth: Payer: Self-pay | Admitting: Internal Medicine

## 2019-08-03 NOTE — Telephone Encounter (Signed)
Attempted to reach patient this morning with no answer and VM full.

## 2019-08-03 NOTE — Chronic Care Management (AMB) (Signed)
  Chronic Care Management   Note  08/03/2019 Name: Lynn Carey MRN: [id number] DOB: 11-19-1940  Lynn Carey is a 79 y.o. year old female who is a primary care patient of Glendale Chard, MD. I reached out to [name] by phone today in response to a referral sent by [name]'s health plan.     [name] was given information about Chronic Care Management services today including:  1. CCM service includes personalized support from designated clinical staff supervised by her physician, including individualized plan of care and coordination with other care providers 2. 24/7 contact phone numbers for assistance for urgent and routine care needs. 3. Service will only be billed when office clinical staff spend 20 minutes or more in a month to coordinate care. 4. Only one practitioner may furnish and bill the service in a calendar month. 5. The patient may stop CCM services at any time (effective at the end of the month) by phone call to the office staff. 6. The patient will be responsible for cost sharing (co-pay) of up to 20% of the service fee (after annual deductible is met).  Patient did not agree to enrollment in care management services and does not wish to consider at this time.  Follow up plan: The care management team is available to follow up with the patient after provider conversation with the patient regarding recommendation for care management engagement and subsequent re-referral to the care management team.   [facility], [place], [place] [address] Direct Dial: [email or web address] Website: [email or web address]

## 2019-08-04 NOTE — Telephone Encounter (Signed)
I asked the pt if she wanted to be release in [facility] to Dr. Einar Gip office and she stated Yes. I have released her in [place]. I deleted her upcoming remote appointments and marked her inactive in [place].

## 2019-08-05 ENCOUNTER — Other Ambulatory Visit: Payer: Self-pay | Admitting: Cardiology

## 2019-08-05 ENCOUNTER — Other Ambulatory Visit: Payer: Self-pay | Admitting: Internal Medicine

## 2019-08-06 DIAGNOSIS — Z95810 Presence of automatic (implantable) cardiac defibrillator: Secondary | ICD-10-CM | POA: Insufficient documentation

## 2019-08-07 ENCOUNTER — Encounter: Payer: Medicare HMO | Admitting: Internal Medicine

## 2019-08-07 ENCOUNTER — Ambulatory Visit: Payer: Medicare HMO | Admitting: Cardiology

## 2019-08-07 DIAGNOSIS — I5022 Chronic systolic (congestive) heart failure: Secondary | ICD-10-CM

## 2019-08-07 DIAGNOSIS — Z95810 Presence of automatic (implantable) cardiac defibrillator: Secondary | ICD-10-CM

## 2019-08-07 DIAGNOSIS — I255 Ischemic cardiomyopathy: Secondary | ICD-10-CM

## 2019-08-19 NOTE — Progress Notes (Signed)
Follow up visit  Subjective:   Lynn Carey, female    DOB: 1940/05/01, 79 y.o.   MRN: [id number]   HPI   Chief Complaint  Patient presents with  . Follow-up    1 year  . chronic systolic heart failure    79 y.o. African American female with coronary artery disease status post CABG 1997, PCI to SVG 06/23/17, ischemic cardiomyopathy EF 20%, hypertension, hyperlipidemia, bilateral stable moderate carotid stenoses  Since her last visit with me, she underwent ICD placement for primary prevention. She is doing well and denies chest pain, shortness of breath, palpitations, leg edema, orthopnea, PND, TIA/syncope.   Current Outpatient Medications on File Prior to Visit  Medication Sig Dispense Refill  . acetaminophen (TYLENOL) 650 MG CR tablet Take 650 mg by mouth every 8 (eight) hours as needed for pain.    Marland Kitchen albuterol (VENTOLIN HFA) 108 (90 Base) MCG/ACT inhaler Inhale 2 puffs into the lungs every 4 (four) hours as needed for wheezing or shortness of breath. 6.7 g 3  . amLODipine (NORVASC) 2.5 MG tablet Take 1 tablet (2.5 mg total) by mouth daily. 30 tablet 5  . aspirin EC 81 MG tablet Take 1 tablet (81 mg total) by mouth daily. (Patient taking differently: Take 81 mg by mouth every evening. ) 90 tablet 3  . BIDIL 20-37.5 MG tablet TAKE 2 TABLETS BY MOUTH THREE TIMES DAILY 180 tablet 0  . Cholecalciferol (VITAMIN D) 2000 UNITS CAPS Take 2,000 Units by mouth daily.     . clopidogrel (PLAVIX) 75 MG tablet TAKE 1 TABLET (75 MG TOTAL) BY MOUTH DAILY. 90 tablet 0  . dorzolamide-timolol (COSOPT) 22.3-6.8 MG/ML ophthalmic solution Place 1 drop into both eyes 3 (three) times daily.    Marland Kitchen ezetimibe (ZETIA) 10 MG tablet TAKE 1 TABLET EVERY DAY 90 tablet 2  . famotidine (PEPCID) 20 MG tablet Take one pill one hour before bedtime (Patient taking differently: Take 20 mg by mouth at bedtime. ) 30 tablet 11  . furosemide (LASIX) 40 MG tablet Take 1 tablet (40 mg total) by mouth daily. 90 tablet 3    . gabapentin (NEURONTIN) 300 MG capsule Take 1 capsule (300 mg total) by mouth 4 (four) times daily. 120 capsule 2  . metoprolol succinate (TOPROL-XL) 50 MG 24 hr tablet Take 1 tablet (50 mg total) by mouth daily. Take with or immediately following a meal. 30 tablet 2  . montelukast (SINGULAIR) 10 MG tablet Take 10 mg by mouth at bedtime.    . nitroGLYCERIN (NITROSTAT) 0.4 MG SL tablet Place 0.4 mg under the tongue every 5 (five) minutes as needed for chest pain.    Marland Kitchen nystatin (NYSTATIN) powder Apply to affected area tid prn 60 g 0  . pantoprazole (PROTONIX) 40 MG tablet TAKE 1 TABLET EVERY DAY 90 tablet 1  . ranolazine (RANEXA) 500 MG 12 hr tablet Take 2 tablets (1,000 mg total) by mouth 2 (two) times daily. (Patient taking differently: Take 500 mg by mouth 2 (two) times daily. ) 60 tablet 6  . rosuvastatin (CRESTOR) 40 MG tablet Take 1 tablet (40 mg total) by mouth daily. 90 tablet 2  . sacubitril-valsartan (ENTRESTO) 97-103 MG Take 1 tablet by mouth 2 (two) times daily. 120 tablet 3  . sitaGLIPtin (JANUVIA) 50 MG tablet Take 1 tablet (50 mg total) by mouth daily. 90 tablet 1  . spironolactone (ALDACTONE) 50 MG tablet TAKE 1 TABLET BY MOUTH EVERY DAY (Patient taking differently: Take 50 mg by  mouth daily at 12 noon. ) 90 tablet 2  . telmisartan-hydrochlorothiazide (MICARDIS HCT) 80-12.5 MG tablet TAKE 1 TABLET EVERY DAY 90 tablet 1  . temazepam (RESTORIL) 15 MG capsule Take 15 mg by mouth at bedtime as needed for sleep.  1  . traMADol (ULTRAM) 50 MG tablet Take 1 tablet (50 mg total) by mouth every 6 (six) hours as needed. (Patient not taking: Reported on 08/23/2019) 20 tablet 0   No current facility-administered medications on file prior to visit.    Cardiovascular & other pertient studies:  EKG 08/23/2019: Sinus rhythm 77 bpm Left atrial enlargement Left anterior fascicular block Old inferior infarct  Carotid artery duplex 07/25/2019:  Stenosis in the right internal carotid artery  (50-69%). Upper end of spectrum.  Stenosis in the left internal carotid artery (>=70%).  The left PSV internal/common carotid artery ratio of 4.88 is consistent with a stenosis of >70%.  Antegrade right vertebral artery flow. Antegrade left vertebral artery flow.  Follow up in six months is appropriate if clinically indicated. No significant change from 06/26/2018.  Echocardiogram 05/10/2018: Left ventricle cavity is normal in size. Severe decrease in global wall motion. Doppler evidence of grade II (pseudonormal) diastolic dysfunction, elevated LAP. LVEF 15-20%. Left atrial cavity is moderately dilated. Moderate (Grade II) mitral regurgitation. Mildly restricted mitral valve leaflets. Moderate tricuspid regurgitation. Estimated pulmonary artery systolic pressure 35  mmHg. Mild pulmonic regurgitation. No significant change compared to previous study on 06/02/2017.  Coronary angiogram 11/25/2017: LM: 95% diffuse disease (Unchanged from prior cath in 06/2017) LAD: 100% ostially occluded (Unchanged from prior cath in 06/2017) LCx: 100% ostially occluded (Unchanged from prior cath in 06/2017) RCA: 100% known proximal occlusion (Not visualized today) LIMA-LAD: Patent with good distal flow SVG-RCA: Patent with atent ostial stent. No other significant stenosis. Distal RCA has moderate disease (Unchanged from prior cath in 06/2017). RCA to LCx collaterals seen   LVEDP 16-20 mmHg  CT angiogram neck 02/01/2018: 80% stenosis and left internal carotid artery with dense calcification 60% stenosis and right internal carotid artery with calcified and noncalcified plaque Scattered atherosclerotic calcifications involving vertebral arteries bilaterally without other significant stenoses  Incidental finding of grade 1 anterolisthesis C2-C3, C3-C4. 4 mm thyroid nodule, and coronary artery disease  Recent labs: 04//21/2021: Glucose 77, BUN/Cr 23/1.33. EGFR 44. Na/K 143/4.7. Rest of the CMP normal H/H  13/40. MCV 97. Platelets 273 HbA1C 5.6% Chol 159, TG 134, HDL 55, LDL 81 TSH 2.18 normal  Review of Systems  Cardiovascular: Negative for chest pain, dyspnea on exertion, leg swelling, palpitations and syncope.        Vitals:   08/23/19 1504  BP: 139/73  Pulse: 79  Resp: 16  Temp: 98 F (36.7 C)  SpO2: 100%    Body mass index is 31.16 kg/m. Filed Weights   08/23/19 1504  Weight: 144 lb (65.3 kg)     Objective:   Physical Exam  Constitutional: No distress.  Neck: No JVD present.  Cardiovascular: Normal rate, regular rhythm, normal heart sounds and intact distal pulses.  No murmur heard. Pulses:      Carotid pulses are on the right side with bruit and on the left side with bruit. Pulmonary/Chest: Effort normal and breath sounds normal. She has no wheezes. She has no rales.  Musculoskeletal:        General: No edema.  Nursing note and vitals reviewed.         Assessment & Recommendations:    78 y.o. African American female with   coronary artery disease status post CABG 1997, PCI to SVG 06/23/17, ischemic cardiomyopathy EF 20%, hypertension, hyperlipidemia, bilateral stable moderate carotid stenoses  Coronary artery disease involving coronary bypass graft of native heart without angina pectoris Stable. No recurrent chest pain. Severe native vessel disease with occluded native arteries and ostial or proximal areas. Patent LIMA-LAD, SVG-RCA grafts with RCA to left circumflex collaterals. Diffuse distal RCA artery disease. Okay to stop Aspirin and continue monotherapy with plavix in light of her CAD and PAD  Ischemic cardiomyopathy HFrEF NYHA class II symptoms. LVEF 25% Continue Entresto to 97-103 mg bid, spironolactone 50 mg daily, metoprolol succinate 50 mg daily, lasix as needed. Unable to add [place] given CKD.  Now s/p ICD placement (04/2019). Will enroll in our ICD clinic  Hypertension: Well controlled.  Carotid artery stenoses: Seen by vascular  surgery Dr. Donzetta Matters. Thought to have moderate stable disease. Asymptomatic. Continue medical management.   Hyperlipidemia: Improved on rosuvastatin 40 mg daily.   F/u in 6 months   Aaralyn Kil Esther Hardy, MD [facility] Pager: [phone] Office: [phone]

## 2019-08-23 ENCOUNTER — Telehealth: Payer: Self-pay

## 2019-08-23 ENCOUNTER — Encounter: Payer: Self-pay | Admitting: Cardiology

## 2019-08-23 ENCOUNTER — Other Ambulatory Visit: Payer: Self-pay

## 2019-08-23 ENCOUNTER — Ambulatory Visit: Payer: Medicare HMO | Admitting: Cardiology

## 2019-08-23 VITALS — BP 139/73 | HR 79 | Temp 98.0°F | Resp 16 | Ht <= 58 in | Wt 144.0 lb

## 2019-08-23 DIAGNOSIS — I10 Essential (primary) hypertension: Secondary | ICD-10-CM | POA: Diagnosis not present

## 2019-08-23 DIAGNOSIS — I6523 Occlusion and stenosis of bilateral carotid arteries: Secondary | ICD-10-CM | POA: Diagnosis not present

## 2019-08-23 DIAGNOSIS — I5022 Chronic systolic (congestive) heart failure: Secondary | ICD-10-CM | POA: Diagnosis not present

## 2019-08-23 DIAGNOSIS — I255 Ischemic cardiomyopathy: Secondary | ICD-10-CM | POA: Diagnosis not present

## 2019-08-23 DIAGNOSIS — E782 Mixed hyperlipidemia: Secondary | ICD-10-CM

## 2019-08-24 NOTE — Telephone Encounter (Signed)
Patient is scheduled for 26th for pacemaker check.

## 2019-08-28 NOTE — Progress Notes (Signed)
Primary Physician/Referring:  Glendale Chard, MD  Patient ID: Lynn Carey, female    DOB: 03-26-1941, 79 y.o.   MRN: [id number]  Chief Complaint  Patient presents with  . Medtronic Pacer Check  . Cardiomyopathy  . Carotid   HPI:    Lynn Carey  is a 79 y.o. AA female with a history of known coronary artery disease with CABG in 1997 with LIMA to LAD and SVG to RCA and PCI to SVG 06/23/17, ischemic and nonischemic cardiomyopathy with decreased EF of 20% (out of proportion to CAD) SP single-chamber Medtronic ICD implantation in January 2021 for primary prevention of sudden cardiac death. She has bilateral asymptomatic high grade (70% by duplex) carotid stenosis, hyperlipidemia, stage IIIb chronic kidney disease, asthmatic bronchitis and hypertension. She is still working in [employer] in [place].  Since last office visit no change in symptoms, she is doing well and denies chest pain, shortness of breath, palpitations, leg edema, orthopnea, PND, TIA/syncope.  Due to COVID-19, she was out of job until recently about 4 to 5 weeks ago she returned to work.  Denies any worsening dyspnea during activity no PND or orthopnea or leg edema.  Past Medical History:  Diagnosis Date  . Anginal pain ([place])    h/o, denies today   . Arthritis    hip - both, shot in L shoulder- 2 weeks ago  . Asthma   . Carotid artery occlusion   . CHF (congestive heart failure) ([place])   . Chronic systolic heart failure ([place]) 09/19/2018  . Complication of anesthesia   . Coronary artery disease   . Diabetes mellitus without complication ([place])   . Encounter for assessment of implantable cardioverter-defibrillator (ICD) 08/30/2019  . GERD (gastroesophageal reflux disease)   . Hypertension    followed by Dr. Einar Gip  . ICD Single chamber Medtronic VISIA MRI VR [id number] 04/21/2019 04/21/2019  . MYOCARDIAL INFARCTION 05/25/2007   Qualifier: History of  By: Milana Obey, Doroteo Bradford    . Myocardial infarction ([place]) 1997  .  PONV (postoperative nausea and vomiting)   . Shortness of breath    Past Surgical History:  Procedure Laterality Date  . CARDIAC CATHETERIZATION  05/2012   see note  . CORONARY ARTERY BYPASS GRAFT  [address]  . CORONARY STENT INTERVENTION Right 06/18/2017   Procedure: CORONARY STENT INTERVENTION;  Surgeon: Adrian Prows, MD;  Location: [facility];  Service: Cardiovascular;  Laterality: Right;  . EYE SURGERY     both eyes, laser procedure  . ICD IMPLANT N/A 04/21/2019   Procedure: ICD IMPLANT;  Surgeon: Deboraha Sprang, MD;  Location: [facility];  Service: Cardiovascular;  Laterality: N/A;  . JOINT REPLACEMENT Left 12/13/12   hip  . LEFT HEART CATH AND CORONARY ANGIOGRAPHY N/A 11/25/2017   Procedure: LEFT HEART CATH AND CORONARY ANGIOGRAPHY;  Surgeon: [name], MD;  Location: [facility];  Service: Cardiovascular;  Laterality: N/A;  . LEFT HEART CATH AND CORS/GRAFTS ANGIOGRAPHY N/A 06/18/2017   Procedure: LEFT HEART CATH AND CORS/GRAFTS ANGIOGRAPHY;  Surgeon: Adrian Prows, MD;  Location: [facility];  Service: Cardiovascular;  Laterality: N/A;  . LEFT HEART CATHETERIZATION WITH CORONARY/GRAFT ANGIOGRAM N/A 04/21/2011   Procedure: LEFT HEART CATHETERIZATION WITH Beatrix Fetters;  Surgeon: Laverda Page, MD;  Location: [facility];  Service: Cardiovascular;  Laterality: N/A;  . PERCUTANEOUS CORONARY INTERVENTION-BALLOON ONLY Right 05/12/2011   Procedure: PERCUTANEOUS CORONARY INTERVENTION-BALLOON ONLY;  Surgeon: Cammy Brochure  Carlynn Herald, MD;  Location: [facility];  Service: Cardiovascular;  Laterality: Right;  . TOTAL HIP ARTHROPLASTY Left 12/13/2012   Procedure: TOTAL HIP ARTHROPLASTY ANTERIOR APPROACH;  Surgeon: Hessie Dibble, MD;  Location: [facility];  Service: Orthopedics;  Laterality: Left;  . TOTAL HIP ARTHROPLASTY Right 01/30/2014   Procedure: TOTAL HIP ARTHROPLASTY ANTERIOR APPROACH;  Surgeon: Hessie Dibble, MD;  Location: [facility];  Service:  Orthopedics;  Laterality: Right;  . TUBAL LIGATION     Family History  Problem Relation Age of Onset  . Allergies Mother   . Asthma Mother   . Heart disease Mother   . Heart attack Mother   . Allergies Son   . Asthma Son   . Heart disease Father   . Heart attack Father   . Breast cancer Daughter 35    Social History   Tobacco Use  . Smoking status: Former Smoker    Packs/day: 0.50    Years: 2.00    Pack years: 1.00    Types: Cigarettes    Quit date: 12/08/1983    Years since quitting: 35.7  . Smokeless tobacco: Never Used  Substance Use Topics  . Alcohol use: No   Marital Status: Divorced  ROS  Review of Systems  Cardiovascular: Negative for dyspnea on exertion, leg swelling and syncope.  Respiratory: Positive for shortness of breath.   Gastrointestinal: Negative for melena.   Objective  Blood pressure 122/64, pulse 79, resp. rate 15, height 4\' 9"  (1.448 m), weight 144 lb (65.3 kg), SpO2 98 %.  Vitals with BMI 08/30/2019 08/23/2019 07/26/2019  Height 4\' 9"  4\' 9"  4' 9.8"  Weight 144 lbs 144 lbs 145 lbs 13 oz  BMI 31.15 37.10 62.69  Systolic 485 462 703  Diastolic 64 73 64  Pulse 79 79 77     Physical Exam  Constitutional: She appears well-developed and well-nourished. No distress.  Cardiovascular: Normal rate, regular rhythm and intact distal pulses. Exam reveals no gallop.  No murmur heard. Pulses:      Carotid pulses are on the right side with bruit and on the left side with bruit. No leg edema, no JVD.   Pulmonary/Chest: Effort normal and breath sounds normal. No accessory muscle usage.  Pacemaker/ICD site noted  in the left infraclavicular fossa.    Abdominal: Soft. Bowel sounds are normal.   Laboratory examination:   Recent Labs    11/03/18 1121 04/18/19 0845 07/26/19 1139  NA 142 142 143  K 4.6 4.9 4.7  CL 100 103 102  CO2 23 24 25   GLUCOSE 85 110* 77  BUN 19 22 23   CREATININE 1.36* 1.27* 1.33*  CALCIUM 10.0 9.8 10.0  GFRNONAA 38* 41* 38*    GFRAA 43* 47* 44*   CrCl cannot be calculated (Patient's most recent lab result is older than the maximum 21 days allowed.).  CMP Latest Ref Rng & Units 07/26/2019 04/18/2019 11/30/2018  Glucose 65 - 99 mg/dL 77 110(H) -  BUN 8 - 27 mg/dL 23 22 -  Creatinine 0.57 - 1.00 mg/dL 1.33(H) 1.27(H) -  Sodium 134 - 144 mmol/L 143 142 -  Potassium 3.5 - 5.2 mmol/L 4.7 4.9 -  Chloride 96 - 106 mmol/L 102 103 -  CO2 20 - 29 mmol/L 25 24 -  Calcium 8.7 - 10.3 mg/dL 10.0 9.8 -  Total Protein 6.0 - 8.5 g/dL 7.9 - 7.4  Total Bilirubin 0.0 - 1.2 mg/dL 0.4 - 0.8  Alkaline Phos 39 - 117  IU/L 87 - 105  AST 0 - 40 IU/L 18 - 14  ALT 0 - 32 IU/L 11 - 12   CBC Latest Ref Rng & Units 07/26/2019 04/18/2019 11/30/2018  WBC 3.4 - 10.8 x10E3/uL 6.9 6.2 5.6  Hemoglobin 11.1 - 15.9 g/dL 13.1 12.5 14.6  Hematocrit 34.0 - 46.6 % 40.4 36.9 45.1  Platelets 150 - 450 x10E3/uL 273 280 232   Lipid Panel     Component Value Date/Time   CHOL 159 07/26/2019 1139   TRIG 134 07/26/2019 1139   HDL 55 07/26/2019 1139   CHOLHDL 2.9 07/26/2019 1139   LDLCALC 81 07/26/2019 1139   HEMOGLOBIN A1C Lab Results  Component Value Date   HGBA1C 5.6 07/26/2019   TSH Recent Labs    07/26/19 1139  TSH 2.180   Medications and allergies   Allergies  Allergen Reactions  . Lisinopril Cough    Outpatient Encounter Medications as of 08/30/2019  Medication Sig Note  . acetaminophen (TYLENOL) 650 MG CR tablet Take 650 mg by mouth every 8 (eight) hours as needed for pain.   Marland Kitchen albuterol (VENTOLIN HFA) 108 (90 Base) MCG/ACT inhaler Inhale 2 puffs into the lungs every 4 (four) hours as needed for wheezing or shortness of breath.   Marland Kitchen amLODipine (NORVASC) 2.5 MG tablet Take 1 tablet (2.5 mg total) by mouth daily.   Marland Kitchen BIDIL 20-37.5 MG tablet TAKE 2 TABLETS BY MOUTH THREE TIMES DAILY   . Cholecalciferol (VITAMIN D) 2000 UNITS CAPS Take 2,000 Units by mouth daily.    . clopidogrel (PLAVIX) 75 MG tablet TAKE 1 TABLET (75 MG TOTAL) BY MOUTH  DAILY.   Marland Kitchen dorzolamide-timolol (COSOPT) 22.3-6.8 MG/ML ophthalmic solution Place 1 drop into both eyes 3 (three) times daily.   Marland Kitchen ezetimibe (ZETIA) 10 MG tablet TAKE 1 TABLET EVERY DAY   . famotidine (PEPCID) 20 MG tablet Take one pill one hour before bedtime (Patient taking differently: Take 20 mg by mouth at bedtime. )   . furosemide (LASIX) 40 MG tablet Take 1 tablet (40 mg total) by mouth daily.   Marland Kitchen gabapentin (NEURONTIN) 300 MG capsule Take 1 capsule (300 mg total) by mouth 4 (four) times daily.   . metoprolol succinate (TOPROL-XL) 50 MG 24 hr tablet Take 1 tablet (50 mg total) by mouth daily. Take with or immediately following a meal.   . montelukast (SINGULAIR) 10 MG tablet Take 10 mg by mouth at bedtime.   . nitroGLYCERIN (NITROSTAT) 0.4 MG SL tablet Place 0.4 mg under the tongue every 5 (five) minutes as needed for chest pain.   Marland Kitchen nystatin (NYSTATIN) powder Apply to affected area tid prn   . pantoprazole (PROTONIX) 40 MG tablet TAKE 1 TABLET EVERY DAY   . ranolazine (RANEXA) 500 MG 12 hr tablet Take 2 tablets (1,000 mg total) by mouth 2 (two) times daily. (Patient taking differently: Take 500 mg by mouth 2 (two) times daily. )   . rosuvastatin (CRESTOR) 40 MG tablet Take 1 tablet (40 mg total) by mouth daily.   . sacubitril-valsartan (ENTRESTO) 97-103 MG Take 1 tablet by mouth 2 (two) times daily.   . sitaGLIPtin (JANUVIA) 50 MG tablet Take 1 tablet (50 mg total) by mouth daily.   Marland Kitchen spironolactone (ALDACTONE) 50 MG tablet TAKE 1 TABLET BY MOUTH EVERY DAY (Patient taking differently: Take 50 mg by mouth daily at 12 noon. )   . temazepam (RESTORIL) 15 MG capsule Take 15 mg by mouth at bedtime as needed for sleep.   Marland Kitchen  traMADol (ULTRAM) 50 MG tablet Take 1 tablet (50 mg total) by mouth every 6 (six) hours as needed.   . [DISCONTINUED] telmisartan-hydrochlorothiazide (MICARDIS HCT) 80-12.5 MG tablet TAKE 1 TABLET EVERY DAY 09/03/2019: On Entresto   No facility-administered encounter  medications on file as of 08/30/2019.    Radiology:   CT angiogram neck 02/01/2018: 80% stenosis and left internal carotid artery with dense calcification 60% stenosis and right internal carotid artery with calcified and noncalcified plaque Scattered atherosclerotic calcifications involving vertebral arteries bilaterally without other significant stenoses  Incidental finding of grade 1 anterolisthesis C2-C3, C3-C4. 4 mm thyroid nodule, and coronary artery disease  Cardiac Studies:   Coronary angiogram 11/25/2017: LM: 95% diffuse disease (Unchanged from prior cath in 06/2017) LAD: 100% ostially occluded (Unchanged from prior cath in 06/2017) LCx: 100% ostially occluded (Unchanged from prior cath in 06/2017) RCA: 100% known proximal occlusion (Not visualized today) LIMA-LAD: Patent with good distal flow SVG-RCA: Patent with atent ostial stent. No other significant stenosis. Distal RCA has moderate disease (Unchanged from prior cath in 06/2017). RCA to LCx collaterals seen   LVEDP 16-20 mmHg  CT angiogram neck 02/01/2018: 80% stenosis and left internal carotid artery with dense calcification 60% stenosis and right internal carotid artery with calcified and noncalcified plaque Scattered atherosclerotic calcifications involving vertebral arteries bilaterally without other significant stenoses Incidental finding of grade 1 anterolisthesis C2-C3, C3-C4. 4 mm thyroid nodule, and coronary artery disease  Echocardiogram 03/22/2019:  Left ventricle cavity is mildly dilated. Normal left ventricular wall  thickness. Severe global hypokinesis with severe LV systolic dysfunction.  LVEF 25%. Diastolic function assessment limited due to severity of mitral  regurgitation.  Left atrial cavity is mildly dilated.  Moderate (Grade III), posteriorly directed, eccentric mitral  regurgitation.  Mild tricuspid regurgitation.  No evidence of pulmonary hypertension.  Marginal improvement in LVEF and  estimated PASP, compared to previous  study on 05/10/2018.   Carotid artery duplex 07/25/2019:  Stenosis in the right internal carotid artery (50-69%). Upper end of  spectrum.  Stenosis in the left internal carotid artery (>=70%).  The left PSV internal/common carotid artery ratio of 4.88 is consistent  with a stenosis of >70%.  Antegrade right vertebral artery flow. Antegrade left vertebral artery  flow.  Follow up in six months is appropriate if clinically indicated. No  significant change from 06/26/2018.  EKG:   EKG 08/23/2019: Sinus rhythm 77 bpm Left atrial enlargement Left anterior fascicular block Old inferior infarct   Scheduled  In office ICD 09/03/19  Medtronic VISIA MRI VR [id number] 04/21/2019 Single (S)/Dual (D)/BV (M) S Presenting NSR Pacer dependant: No. Underlying NSR. AP NA%, VP <0.1%. AMS Episodes 0.   HVR 0.  Longevity 11.3 Years/Voltage. Charge time 3.8Sec.  Observations: Normal single chamber ICD function.  Changes: None  EKG:    08/23/2019: Sinus rhythm 77 bpm Left atrial enlargement Left anterior fascicular block Old inferior infarct   Assessment     ICD-10-CM   1. Encounter for assessment of implantable cardioverter-defibrillator (ICD)  Z45.02   2. ICD Single chamber Medtronic VISIA MRI VR [id number] 04/21/2019  Z95.810   3. Chronic systolic heart failure (HCC)  I50.22 PCV ECHOCARDIOGRAM COMPLETE  4. Carotid stenosis, asymptomatic, bilateral  I65.23      No orders of the defined types were placed in this encounter.   Medications Discontinued During This Encounter  Medication Reason  . telmisartan-hydrochlorothiazide (MICARDIS HCT) 80-12.5 MG tablet Change in therapy    Recommendations:   I reviewed her ICD  data, In[place] COVID-19 she was out of work but has returned to working in the [place] [employer], about 3 weeks to 4 weeks ago.  Since then ICD also reveals increased fluid overload, I discussed with her regarding making diet changes and  clearly previous to that she had euvolemia.  Not in obvious decompensated heart failure, presently on appropriate dose of furosemide and she does indeed have stage IIIb chronic kidney disease but tolerating high-dose Entresto and also spironolactone without further complications with stable renal function.    It appears that she is also on telmisartan HCT which needs to be discontinued as she is on high-dose 97/103 mg Entresto twice daily.  Our nurses will reach out to her at home and confirm that she is not on this medication later.  Advised her to bring all her medications to doctor's visits.  She will need close monitoring of her heart failure symptoms, we will continue to monitor her ICD on a monthly basis remotely.  She will also need a repeat echocardiogram to follow-up on LVEF as it has been 6 months since prior echocardiogram.  With regard to carotid artery stenosis, duplex has remained stable, will continue observation for now and no indication for surgery.  We would like to see him back in our clinic in 4 to 6 weeks for close monitoring.  Adrian Prows, MD, [facility] 09/03/2019, 11:32 AM [facility] [place] Office: [phone]

## 2019-08-30 ENCOUNTER — Ambulatory Visit: Payer: Medicare HMO | Admitting: Cardiology

## 2019-08-30 ENCOUNTER — Encounter: Payer: Self-pay | Admitting: Cardiology

## 2019-08-30 ENCOUNTER — Other Ambulatory Visit: Payer: Self-pay

## 2019-08-30 VITALS — BP 122/64 | HR 79 | Resp 15 | Ht <= 58 in | Wt 144.0 lb

## 2019-08-30 DIAGNOSIS — Z4502 Encounter for adjustment and management of automatic implantable cardiac defibrillator: Secondary | ICD-10-CM

## 2019-08-30 DIAGNOSIS — I5022 Chronic systolic (congestive) heart failure: Secondary | ICD-10-CM | POA: Diagnosis not present

## 2019-08-30 DIAGNOSIS — Z95810 Presence of automatic (implantable) cardiac defibrillator: Secondary | ICD-10-CM

## 2019-08-30 DIAGNOSIS — I6523 Occlusion and stenosis of bilateral carotid arteries: Secondary | ICD-10-CM

## 2019-08-30 DIAGNOSIS — I255 Ischemic cardiomyopathy: Secondary | ICD-10-CM | POA: Diagnosis not present

## 2019-08-30 HISTORY — DX: Encounter for adjustment and management of automatic implantable cardiac defibrillator: Z45.02

## 2019-08-30 NOTE — Patient Instructions (Signed)
Heart Failure Eating Plan Heart failure, also called congestive heart failure, occurs when your heart does not pump blood well enough to meet your body's needs for oxygen-rich blood. Heart failure is a long-term (chronic) condition. Living with heart failure can be challenging. However, following your health care provider's instructions about a healthy lifestyle and working with a diet and nutrition specialist (dietitian) to choose the right foods may help to improve your symptoms. What are tips for following this plan? Reading food labels  Check food labels for the amount of sodium per serving. Choose foods that have less than 140 mg (milligrams) of sodium in each serving.  Check food labels for the number of calories per serving. This is important if you need to limit your daily calorie intake to lose weight.  Check food labels for the serving size. If you eat more than one serving, you will be eating more sodium and calories than what is listed on the label.  Look for foods that are labeled as "sodium-free," "very low sodium," or "low sodium." ? Foods labeled as "reduced sodium" or "lightly salted" may still have more sodium than what is recommended for you. Cooking  Avoid adding salt when cooking. Ask your health care provider or dietitian before using salt substitutes.  Season food with salt-free seasonings, spices, or herbs. Check the label of seasoning mixes to make sure they do not contain salt.  Cook with heart-healthy oils, such as olive, canola, soybean, or sunflower oil.  Do not fry foods. Cook foods using low-fat methods, such as baking, boiling, grilling, and broiling.  Limit unhealthy fats when cooking by: ? Removing the skin from poultry, such as chicken. ? Removing all visible fats from meats. ? Skimming the fat off from stews, soups, and gravies before serving them. Meal planning   Limit your intake of: ? Processed, canned, or pre-packaged foods. ? Foods that are  high in trans fat, such as fried foods. ? Sweets, desserts, sugary drinks, and other foods with added sugar. ? Full-fat dairy products, such as whole milk.  Eat a balanced diet that includes: ? 4-5 servings of fruit each day and 4-5 servings of vegetables each day. At each meal, try to fill half of your plate with fruits and vegetables. ? Up to 6-8 servings of whole grains each day. ? Up to 2 servings of lean meat, poultry, or fish each day. One serving of meat is equal to 3 oz. This is about the same size as a deck of cards. ? 2 servings of low-fat dairy each day. ? Heart-healthy fats. Healthy fats called omega-3 fatty acids are found in foods such as flaxseed and cold-water fish like sardines, salmon, and mackerel.  Aim to eat 25-35 g (grams) of fiber a day. Foods that are high in fiber include apples, broccoli, carrots, beans, peas, and whole grains.  Do not add salt or condiments that contain salt (such as soy sauce) to foods before eating.  When eating at a restaurant, ask that your food be prepared with less salt or no salt, if possible.  Try to eat 2 or more vegetarian meals each week.  Eat more home-cooked food and eat less restaurant, buffet, and fast food. General information  Do not eat more than 2,300 mg of salt (sodium) a day. The amount of sodium that is recommended for you may be lower, depending on your condition.  Maintain a healthy body weight as directed. Ask your health care provider what a healthy weight is  for you. ? Check your weight every day. ? Work with your health care provider and dietitian to make a plan that is right for you to lose weight or maintain your current weight.  Limit how much fluid you drink. Ask your health care provider or dietitian how much fluid you can have each day.  Limit or avoid alcohol as told by your health care provider or dietitian. Recommended foods The items listed may not be a complete list. Talk with your dietitian about what  dietary choices are best for you. Fruits All fresh, frozen, and canned fruits. Dried fruits, such as raisins, prunes, and cranberries. Vegetables All fresh vegetables. Vegetables that are frozen without sauce or added salt. Low-sodium or sodium-free canned vegetables. Grains Bread with less than 80 mg of sodium per slice. Whole-wheat pasta, quinoa, and brown rice. Oats and oatmeal. Barley. [place]. Grits and cream of wheat. Whole-grain and whole-wheat cold cereal. Meats and other protein foods Lean cuts of meat. Skinless chicken and [place]. Fish with high omega-3 fatty acids, such as salmon, sardines, and other cold-water fishes. Eggs. Dried beans, peas, and edamame. Unsalted nuts and nut butters. Dairy Low-fat or nonfat (skim) milk and dried milk. Rice milk, soy milk, and almond milk. Low-fat or nonfat yogurt. Small amounts of reduced-sodium block cheese. Low-sodium cottage cheese. Fats and oils Olive, canola, soybean, flaxseed, or sunflower oil. Avocado. Sweets and desserts Apple sauce. Granola bars. Sugar-free pudding and gelatin. Frozen fruit bars. Seasoning and other foods Fresh and dried herbs. Lemon or lime juice. Vinegar. Low-sodium ketchup. Salt-free marinades, salad dressings, sauces, and seasonings. The items listed above may not be a complete list of foods and beverages you can eat. Contact a dietitian for more information. Foods to avoid The items listed may not be a complete list. Talk with your dietitian about what dietary choices are best for you. Fruits Fruits that are dried with sodium-containing preservatives. Vegetables Canned vegetables. Frozen vegetables with sauce or seasonings. Creamed vegetables. [place] fries. Onion rings. Pickled vegetables and sauerkraut. Grains Bread with more than 80 mg of sodium per slice. Hot or cold cereal with more than 140 mg sodium per serving. Salted pretzels and crackers. Pre-packaged breadcrumbs. Bagels, croissants, and biscuits. Meats  and other protein foods Ribs and chicken wings. Bacon, ham, pepperoni, bologna, salami, and packaged luncheon meats. Hot dogs, bratwurst, and sausage. Canned meat. Smoked meat and fish. Salted nuts and seeds. Dairy Whole milk, half-and-half, and cream. Buttermilk. Processed cheese, cheese spreads, and cheese curds. Regular cottage cheese. Feta cheese. Shredded cheese. String cheese. Fats and oils Butter, lard, shortening, ghee, and bacon fat. Canned and packaged gravies. Seasoning and other foods Onion salt, garlic salt, table salt, and sea salt. Marinades. Regular salad dressings. Relishes, pickles, and olives. Meat flavorings and tenderizers, and bouillon cubes. Horseradish, ketchup, and mustard. Worcestershire sauce. Teriyaki sauce, soy sauce (including reduced sodium). Hot sauce and Tabasco sauce. Steak sauce, fish sauce, oyster sauce, and cocktail sauce. Taco seasonings. Barbecue sauce. Tartar sauce. The items listed above may not be a complete list of foods and beverages you should avoid. Contact a dietitian for more information. Summary  A heart failure eating plan includes changes that limit your intake of sodium and unhealthy fat, and it may help you lose weight or maintain a healthy weight. Your health care provider may also recommend limiting how much fluid you drink.  Most people with heart failure should eat no more than 2,300 mg of salt (sodium) a day. The amount of sodium  that is recommended for you may be lower, depending on your condition.  Contact your health care provider or dietitian before making any major changes to your diet. This information is not intended to replace advice given to you by your health care provider. Make sure you discuss any questions you have with your health care provider. Document Revised: 05/19/2018 Document Reviewed: 08/07/2016 Elsevier Patient Education  [place].

## 2019-09-05 ENCOUNTER — Other Ambulatory Visit: Payer: Self-pay

## 2019-09-07 ENCOUNTER — Telehealth: Payer: Self-pay

## 2019-09-07 ENCOUNTER — Other Ambulatory Visit: Payer: Self-pay

## 2019-09-07 NOTE — Telephone Encounter (Signed)
-----   Message from [name], MD sent at 09/03/2019  3:23 PM EDT ----- Regarding: Telmisartan-HCTZ Thank you. She wasn't on this before (01/2019). Not sure when and who started it and I didn't cath it.  Bj Morlock/Tara, please follow up and verify with the patient if she is on telmisartan-HCTZ.  If she is taking it =, please ask her to disconitnue it.  Thanks MJP  ----- Message ----- From: Adrian Prows, MD Sent: 09/03/2019  11:34 AM EDT To: [name], MD  Review my comments. I updated the chart. You may want to see her back in 6 weeks

## 2019-09-07 NOTE — Telephone Encounter (Signed)
Called pt to inform her to stop the  Telmisartan-HCTZ. Pt understood. And medication were reviewed.

## 2019-09-08 ENCOUNTER — Telehealth: Payer: Self-pay

## 2019-09-18 NOTE — Telephone Encounter (Signed)
error 

## 2019-09-20 ENCOUNTER — Other Ambulatory Visit: Payer: Self-pay

## 2019-09-20 ENCOUNTER — Encounter: Payer: Self-pay | Admitting: Adult Health

## 2019-09-20 ENCOUNTER — Ambulatory Visit: Payer: Medicare HMO | Admitting: Internal Medicine

## 2019-09-20 ENCOUNTER — Ambulatory Visit: Payer: Medicare HMO | Admitting: Adult Health

## 2019-09-20 VITALS — BP 122/82 | HR 84 | Ht 59.5 in | Wt 146.0 lb

## 2019-09-20 DIAGNOSIS — R05 Cough: Secondary | ICD-10-CM

## 2019-09-20 DIAGNOSIS — R053 Chronic cough: Secondary | ICD-10-CM

## 2019-09-20 NOTE — Patient Instructions (Addendum)
Continue on current regimen .  Delsym 2 tsp Twice daily  For cough As needed   Continue on GERD diet  Saline nasal rinses As needed   Follow up in 2 months with PFT -spriometry with DLCO only .

## 2019-09-20 NOTE — Progress Notes (Signed)
@Patient  ID: Lynn Carey, female    DOB: Jul 18, 1940, 79 y.o.   MRN: [id number]     Referring provider: Glendale Chard, MD  HPI: 79 yo female former smoker followed for chronic cough  Medical history significant for congestive heart failure (EF 25%), ischemic cardiomyopathy  TEST/EVENTS :  Pulmonary function testing 2015 was normal  09/20/2019 Follow up : Chronic Cough  Patient returns today for a 45-month follow-up.  Patient seen last visit with a flare of her chronic cough . Gabapentin was  increased to 300 mg 4 times daily.  Patient says cough is better.  She denies any chest pain orthopnea edema or leg swelling.  Patient continues to be active and works full time . Chest x-ray April 21, 2019 showed minor atelectasis in the left lower lung.    Allergies  Allergen Reactions  . Lisinopril Cough    Immunization History  Administered Date(s) Administered  . Fluad Quad(high Dose 65+) 11/30/2018  . Influenza, High Dose Seasonal PF 11/30/2018  . Influenza,inj,Quad PF,6+ Mos 12/16/2012, 02/01/2014  . Influenza-Unspecified 12/13/2017  . PFIZER SARS-COV-2 Vaccination 05/18/2019, 06/10/2019  . Pneumococcal Conjugate-13 04/03/2019, 04/03/2019  . Pneumococcal Polysaccharide-23 12/16/2012    Past Medical History:  Diagnosis Date  . Anginal pain ([place])    h/o, denies today   . Arthritis    hip - both, shot in L shoulder- 2 weeks ago  . Asthma   . Carotid artery occlusion   . CHF (congestive heart failure) ([place])   . Chronic systolic heart failure ([place]) 09/19/2018  . Complication of anesthesia   . Coronary artery disease   . Diabetes mellitus without complication ([place])   . Encounter for assessment of implantable cardioverter-defibrillator (ICD) 08/30/2019  . GERD (gastroesophageal reflux disease)   . Hypertension    followed by Dr. Einar Gip  . ICD Single chamber Medtronic VISIA MRI VR [id number] 04/21/2019 04/21/2019  . MYOCARDIAL INFARCTION 05/25/2007   Qualifier: History of   By: Milana Obey, Doroteo Bradford    . Myocardial infarction ([place]) 1997  . PONV (postoperative nausea and vomiting)   . Shortness of breath     Tobacco History: Social History   Tobacco Use  Smoking Status Former Smoker  . Packs/day: 0.50  . Years: 2.00  . Pack years: 1.00  . Types: Cigarettes  . Quit date: 12/08/1983  . Years since quitting: 35.8  Smokeless Tobacco Never Used   Counseling given: Not Answered   Outpatient Medications Prior to Visit  Medication Sig Dispense Refill  . acetaminophen (TYLENOL) 650 MG CR tablet Take 650 mg by mouth every 8 (eight) hours as needed for pain.    Marland Kitchen albuterol (VENTOLIN HFA) 108 (90 Base) MCG/ACT inhaler Inhale 2 puffs into the lungs every 4 (four) hours as needed for wheezing or shortness of breath. 6.7 g 3  . amLODipine (NORVASC) 2.5 MG tablet Take 1 tablet (2.5 mg total) by mouth daily. 30 tablet 5  . BIDIL 20-37.5 MG tablet TAKE 2 TABLETS BY MOUTH THREE TIMES DAILY 180 tablet 0  . Cholecalciferol (VITAMIN D) 2000 UNITS CAPS Take 2,000 Units by mouth daily.     . clopidogrel (PLAVIX) 75 MG tablet TAKE 1 TABLET (75 MG TOTAL) BY MOUTH DAILY. 90 tablet 0  . dorzolamide-timolol (COSOPT) 22.3-6.8 MG/ML ophthalmic solution Place 1 drop into both eyes 3 (three) times daily.    Marland Kitchen ezetimibe (ZETIA) 10 MG tablet TAKE 1 TABLET EVERY DAY 90 tablet 2  . furosemide (LASIX) 40 MG tablet Take 1  tablet (40 mg total) by mouth daily. 90 tablet 3  . gabapentin (NEURONTIN) 300 MG capsule Take 1 capsule (300 mg total) by mouth 4 (four) times daily. 120 capsule 2  . metoprolol succinate (TOPROL-XL) 50 MG 24 hr tablet Take 1 tablet (50 mg total) by mouth daily. Take with or immediately following a meal. 30 tablet 2  . montelukast (SINGULAIR) 10 MG tablet Take 10 mg by mouth at bedtime.    . nitroGLYCERIN (NITROSTAT) 0.4 MG SL tablet Place 0.4 mg under the tongue every 5 (five) minutes as needed for chest pain.    Marland Kitchen nystatin (NYSTATIN) powder Apply to affected area tid prn 60  g 0  . pantoprazole (PROTONIX) 40 MG tablet TAKE 1 TABLET EVERY DAY 90 tablet 1  . ranolazine (RANEXA) 500 MG 12 hr tablet Take 2 tablets (1,000 mg total) by mouth 2 (two) times daily. (Patient taking differently: Take 500 mg by mouth 2 (two) times daily. ) 60 tablet 6  . rosuvastatin (CRESTOR) 40 MG tablet Take 1 tablet (40 mg total) by mouth daily. 90 tablet 2  . sacubitril-valsartan (ENTRESTO) 97-103 MG Take 1 tablet by mouth 2 (two) times daily. 120 tablet 3  . sitaGLIPtin (JANUVIA) 50 MG tablet Take 1 tablet (50 mg total) by mouth daily. 90 tablet 1  . spironolactone (ALDACTONE) 50 MG tablet TAKE 1 TABLET BY MOUTH EVERY DAY (Patient taking differently: Take 50 mg by mouth daily at 12 noon. ) 90 tablet 2  . temazepam (RESTORIL) 15 MG capsule Take 15 mg by mouth at bedtime as needed for sleep.  1  . traMADol (ULTRAM) 50 MG tablet Take 1 tablet (50 mg total) by mouth every 6 (six) hours as needed. 20 tablet 0   No facility-administered medications prior to visit.     Review of Systems:   Constitutional:   No  weight loss, night sweats,  Fevers, chills, + fatigue, or  lassitude.  HEENT:   No headaches,  Difficulty swallowing,  Tooth/dental problems, or  Sore throat,                No sneezing, itching, ear ache, nasal congestion, post nasal drip,   CV:  No chest pain,  Orthopnea, PND, swelling in lower extremities, anasarca, dizziness, palpitations, syncope.   GI  No heartburn, indigestion, abdominal pain, nausea, vomiting, diarrhea, change in bowel habits, loss of appetite, bloody stools.   Resp: .  No chest wall deformity  Skin: no rash or lesions.  GU: no dysuria, change in color of urine, no urgency or frequency.  No flank pain, no hematuria   MS:  No joint pain or swelling.  No decreased range of motion.  No back pain.    Physical Exam  BP 122/82 (BP Location: Left Arm, Cuff Size: Normal)   Pulse 84   Ht 4' 11.5" (1.511 m)   Wt 146 lb (66.2 kg)   SpO2 99%   BMI 28.99  kg/m   GEN: A/Ox3; pleasant , NAD, elderly    HEENT:  [place]/AT, NOSE-clear, THROAT-clear, no lesions, no postnasal drip or exudate noted.   NECK:  Supple w/ fair ROM; no JVD; normal carotid impulses w/o bruits; no thyromegaly or nodules palpated; no lymphadenopathy.    RESP  Clear  P & A; w/o, wheezes/ rales/ or rhonchi. no accessory muscle use, no dullness to percussion  CARD:  RRR, no m/r/g, no peripheral edema, pulses intact, no cyanosis or clubbing.  GI:   Soft & nt;  nml bowel sounds; no organomegaly or masses detected.   Musco: Warm bil, no deformities or joint swelling noted.   Neuro: alert, no focal deficits noted.    Skin: Warm, no lesions or rashes    Lab Results:  CBC  BMET  No results found for: BNP  ProBNP No results found for: PROBNP  Imaging: No results found.    PFT Results Latest Ref Rng & Units 07/27/2013  FVC-Pre L 1.82  FVC-Predicted Pre % 107  FVC-Post L 1.88  FVC-Predicted Post % 110  Pre FEV1/FVC % % 80  Post FEV1/FCV % % 85  FEV1-Pre L 1.45  FEV1-Predicted Pre % 112  FEV1-Post L 1.59  DLCO UNC% % 85  DLCO COR %Predicted % 112  TLC L 3.53  TLC % Predicted % 83  RV % Predicted % 71    No results found for: NITRICOXIDE      Assessment & Plan:   No problem-specific Assessment & Plan notes found for this encounter.     Rexene Edison, NP 09/20/2019

## 2019-09-21 NOTE — Assessment & Plan Note (Signed)
Chronic cough with no perceived benefit on gabapentin. We will continue current regimen along with trigger prevention and cough control. On return still doing well consider going down on dosage of Neurontin Check spirometry with  DLCO return  Plan  Patient Instructions  Continue on current regimen .  Delsym 2 tsp Twice daily  For cough As needed   Continue on GERD diet  Saline nasal rinses As needed   Follow up in 2 months with PFT -spriometry with DLCO only .

## 2019-10-03 ENCOUNTER — Ambulatory Visit: Payer: Medicare HMO

## 2019-10-03 ENCOUNTER — Other Ambulatory Visit: Payer: Self-pay

## 2019-10-03 DIAGNOSIS — I5022 Chronic systolic (congestive) heart failure: Secondary | ICD-10-CM | POA: Diagnosis not present

## 2019-10-09 NOTE — Progress Notes (Signed)
Severe decrease in LV function is stable. Pulmonary hypertension probably from heart failure (Diastolic and systolic) and will discuss on OV soon

## 2019-10-10 ENCOUNTER — Telehealth: Payer: Self-pay

## 2019-10-11 ENCOUNTER — Encounter: Payer: Self-pay | Admitting: Internal Medicine

## 2019-10-11 NOTE — Telephone Encounter (Signed)
error 

## 2019-10-18 ENCOUNTER — Ambulatory Visit: Payer: Medicare HMO | Admitting: Cardiology

## 2019-10-18 ENCOUNTER — Other Ambulatory Visit: Payer: Self-pay

## 2019-10-18 ENCOUNTER — Encounter: Payer: Self-pay | Admitting: Cardiology

## 2019-10-18 VITALS — BP 151/84 | HR 79 | Ht 59.5 in | Wt 148.0 lb

## 2019-10-18 DIAGNOSIS — I25810 Atherosclerosis of coronary artery bypass graft(s) without angina pectoris: Secondary | ICD-10-CM

## 2019-10-18 DIAGNOSIS — E782 Mixed hyperlipidemia: Secondary | ICD-10-CM

## 2019-10-18 DIAGNOSIS — Z4502 Encounter for adjustment and management of automatic implantable cardiac defibrillator: Secondary | ICD-10-CM

## 2019-10-18 DIAGNOSIS — I6523 Occlusion and stenosis of bilateral carotid arteries: Secondary | ICD-10-CM

## 2019-10-18 NOTE — Progress Notes (Signed)
Follow up visit  Subjective:   Lynn Carey, female    DOB: 03/16/1941, 79 y.o.   MRN: [id number]   HPI   Chief Complaint  Patient presents with  . Chronic systolic heart failure  . Bilateral carotid artery stenosis  . Follow-up    79 y.o. African American female with coronary artery disease status post CABG 1997, PCI to SVG 06/23/17, ischemic cardiomyopathy EF 20%, hypertension, hyperlipidemia, bilateral stable moderate carotid stenoses.  Recently, it was found out that she was also on telmisartan-HCTZ while on Entresto. Rightly Dr. Einar Gip stopped Telmisartan-HCTZ. Blood pressure is now elevated. Recent echocardiogram showed PASP 54 mmHg. She denies any worse than usual exertional dyspnea, denies any leg edema. She denies any stroke/aTIA symptoms.   Current Outpatient Medications on File Prior to Visit  Medication Sig Dispense Refill  . acetaminophen (TYLENOL) 650 MG CR tablet Take 650 mg by mouth every 8 (eight) hours as needed for pain.    Marland Kitchen albuterol (VENTOLIN HFA) 108 (90 Base) MCG/ACT inhaler Inhale 2 puffs into the lungs every 4 (four) hours as needed for wheezing or shortness of breath. 6.7 g 3  . amLODipine (NORVASC) 2.5 MG tablet Take 1 tablet (2.5 mg total) by mouth daily. 30 tablet 5  . BIDIL 20-37.5 MG tablet TAKE 2 TABLETS BY MOUTH THREE TIMES DAILY 180 tablet 0  . Cholecalciferol (VITAMIN D) 2000 UNITS CAPS Take 2,000 Units by mouth daily.     . clopidogrel (PLAVIX) 75 MG tablet TAKE 1 TABLET (75 MG TOTAL) BY MOUTH DAILY. 90 tablet 0  . dorzolamide-timolol (COSOPT) 22.3-6.8 MG/ML ophthalmic solution Place 1 drop into both eyes 3 (three) times daily.    Marland Kitchen ezetimibe (ZETIA) 10 MG tablet TAKE 1 TABLET EVERY DAY 90 tablet 2  . furosemide (LASIX) 40 MG tablet Take 1 tablet (40 mg total) by mouth daily. 90 tablet 3  . gabapentin (NEURONTIN) 300 MG capsule Take 1 capsule (300 mg total) by mouth 4 (four) times daily. 120 capsule 2  . metoprolol succinate (TOPROL-XL) 50 MG  24 hr tablet Take 1 tablet (50 mg total) by mouth daily. Take with or immediately following a meal. 30 tablet 2  . montelukast (SINGULAIR) 10 MG tablet Take 10 mg by mouth at bedtime.    . nitroGLYCERIN (NITROSTAT) 0.4 MG SL tablet Place 0.4 mg under the tongue every 5 (five) minutes as needed for chest pain.    Marland Kitchen nystatin (NYSTATIN) powder Apply to affected area tid prn 60 g 0  . pantoprazole (PROTONIX) 40 MG tablet TAKE 1 TABLET EVERY DAY 90 tablet 1  . ranolazine (RANEXA) 500 MG 12 hr tablet Take 2 tablets (1,000 mg total) by mouth 2 (two) times daily. (Patient taking differently: Take 500 mg by mouth 2 (two) times daily. ) 60 tablet 6  . rosuvastatin (CRESTOR) 40 MG tablet Take 1 tablet (40 mg total) by mouth daily. 90 tablet 2  . sacubitril-valsartan (ENTRESTO) 97-103 MG Take 1 tablet by mouth 2 (two) times daily. 120 tablet 3  . sitaGLIPtin (JANUVIA) 50 MG tablet Take 1 tablet (50 mg total) by mouth daily. (Patient taking differently: Take 50 mg by mouth daily. 3 TIMES A WEEK) 90 tablet 1  . spironolactone (ALDACTONE) 50 MG tablet TAKE 1 TABLET BY MOUTH EVERY DAY (Patient taking differently: Take 50 mg by mouth daily at 12 noon. ) 90 tablet 2  . temazepam (RESTORIL) 15 MG capsule Take 15 mg by mouth at bedtime as needed for sleep.  1  . traMADol (ULTRAM) 50 MG tablet Take 1 tablet (50 mg total) by mouth every 6 (six) hours as needed. 20 tablet 0   No current facility-administered medications on file prior to visit.    Cardiovascular & other pertient studies:  Echocardiogram 10/03/2019:  Left ventricle cavity is normal in size. Mild concentric hypertrophy of  the left ventricle. Severe global hypokinesis. LVEF 25-30%.  Left atrial cavity is mildly dilated.  Trileaflet aortic valve with no regurgitation. Mildly restricted aortic  valve leaflets.  Moderate (Grade II) mitral regurgitation.  Moderate tricuspid regurgitation. Estimated pulmonary artery systolic  pressure is 54 mmHg.    Compared to previous study on 03/25/2019, pulmonary hypertension is new.   Scheduled  In office ICD 09/03/19  Medtronic VISIA MRI VR [id number] 04/21/2019 Single (S)/Dual (D)/BV (M) S Presenting NSR Pacer dependant: No. Underlying NSR. AP NA%, VP <0.1%. AMS Episodes 0.   HVR 0.  Longevity 11.3 Years/Voltage. Charge time 3.8Sec.  Observations: Normal single chamber ICD function.  Changes: None EKG 08/23/2019: Sinus rhythm 77 bpm Left atrial enlargement Left anterior fascicular block Old inferior infarct  Carotid artery duplex 07/25/2019:  Stenosis in the right internal carotid artery (50-69%). Upper end of spectrum.  Stenosis in the left internal carotid artery (>=70%).  The left PSV internal/common carotid artery ratio of 4.88 is consistent with a stenosis of >70%.  Antegrade right vertebral artery flow. Antegrade left vertebral artery flow.  Follow up in six months is appropriate if clinically indicated. No significant change from 06/26/2018.  Echocardiogram 05/10/2018: Left ventricle cavity is normal in size. Severe decrease in global wall motion. Doppler evidence of grade II (pseudonormal) diastolic dysfunction, elevated LAP. LVEF 15-20%. Left atrial cavity is moderately dilated. Moderate (Grade II) mitral regurgitation. Mildly restricted mitral valve leaflets. Moderate tricuspid regurgitation. Estimated pulmonary artery systolic pressure 35  mmHg. Mild pulmonic regurgitation. No significant change compared to previous study on 06/02/2017.  Coronary angiogram 11/25/2017: LM: 95% diffuse disease (Unchanged from prior cath in 06/2017) LAD: 100% ostially occluded (Unchanged from prior cath in 06/2017) LCx: 100% ostially occluded (Unchanged from prior cath in 06/2017) RCA: 100% known proximal occlusion (Not visualized today) LIMA-LAD: Patent with good distal flow SVG-RCA: Patent with atent ostial stent. No other significant stenosis. Distal RCA has moderate disease (Unchanged  from prior cath in 06/2017). RCA to LCx collaterals seen   LVEDP 16-20 mmHg  CT angiogram neck 02/01/2018: 80% stenosis and left internal carotid artery with dense calcification 60% stenosis and right internal carotid artery with calcified and noncalcified plaque Scattered atherosclerotic calcifications involving vertebral arteries bilaterally without other significant stenoses  Incidental finding of grade 1 anterolisthesis C2-C3, C3-C4. 4 mm thyroid nodule, and coronary artery disease  Recent labs: 04//21/2021: Glucose 77, BUN/Cr 23/1.33. EGFR 44. Na/K 143/4.7. Rest of the CMP normal H/H 13/40. MCV 97. Platelets 273 HbA1C 5.6% Chol 159, TG 134, HDL 55, LDL 81 TSH 2.18 normal  Review of Systems  Cardiovascular: Negative for chest pain, dyspnea on exertion, leg swelling, palpitations and syncope.        Vitals:   10/18/19 1436 10/18/19 1446  BP: (!) 161/83 (!) 151/84  Pulse: 79 79  SpO2: 99%      Body mass index is 29.39 kg/m. Filed Weights   10/18/19 1436  Weight: 148 lb (67.1 kg)     Objective:   Physical Exam  Constitutional: No distress.  Neck: No JVD present.  Cardiovascular: Normal rate, regular rhythm, normal heart sounds and intact distal pulses.  No murmur heard. Pulses:      Carotid pulses are on the right side with bruit and on the left side with bruit. Pulmonary/Chest: Effort normal and breath sounds normal. She has no wheezes. She has no rales.  Musculoskeletal:        General: No edema.  Nursing note and vitals reviewed.         Assessment & Recommendations:    79 y.o. African American female with coronary artery disease status post CABG 1997, PCI to SVG 06/23/17, ischemic cardiomyopathy EF 20%, hypertension, hyperlipidemia, bilateral stable moderate carotid stenoses.  Coronary artery disease involving coronary bypass graft of native heart without angina pectoris Stable. No recurrent chest pain. Severe native vessel disease with occluded  native arteries and ostial or proximal areas. Patent LIMA-LAD, SVG-RCA grafts with RCA to left circumflex collaterals (11/2017). Diffuse distal RCA artery disease. Continue plavix for CAD and PAD  Ischemic cardiomyopathy HFrEF NYHA class II symptoms. LVEF 25% Continue Entresto to 97-103 mg bid, spironolactone 50 mg daily, metoprolol succinate 50 mg daily, lasix as needed. Unable to add [place] given CKD.  Now s/p ICD placement (04/2019).  Pulmonary hypertension: RVSP 54 mmHg. Likely WHO Grp II. Clinically not symptomatic. Continue heart failure treatment for now.  Repeat echocardiogram in 03/2020.   Hypertension: Increased amlodipine to 5 mg daily. Arranged for remote patient monitoring through pur pharmacist Manuela Schwartz.  Carotid artery stenoses: Seen by vascular surgery Dr. Donzetta Matters. Thought to have moderate stable disease. Asymptomatic. Continue medical management.   Hyperlipidemia: Improved on rosuvastatin 40 mg daily.  Repeat lipid panel. IF LDL remains >70, will consider adding Repatha.   F/u in 4 weeks   Joshua, MD [facility] Pager: [phone] Office: [phone]

## 2019-10-20 DIAGNOSIS — Z4502 Encounter for adjustment and management of automatic implantable cardiac defibrillator: Secondary | ICD-10-CM | POA: Diagnosis not present

## 2019-10-20 DIAGNOSIS — I25810 Atherosclerosis of coronary artery bypass graft(s) without angina pectoris: Secondary | ICD-10-CM | POA: Diagnosis not present

## 2019-10-20 DIAGNOSIS — I5022 Chronic systolic (congestive) heart failure: Secondary | ICD-10-CM | POA: Diagnosis not present

## 2019-10-20 DIAGNOSIS — Z95810 Presence of automatic (implantable) cardiac defibrillator: Secondary | ICD-10-CM | POA: Diagnosis not present

## 2019-10-25 ENCOUNTER — Emergency Department (HOSPITAL_COMMUNITY)
Admission: EM | Admit: 2019-10-25 | Discharge: 2019-10-26 | Disposition: A | Discharge status: Home / Self Care | Payer: Medicare HMO | Attending: Emergency Medicine | Admitting: Emergency Medicine

## 2019-10-25 ENCOUNTER — Emergency Department (HOSPITAL_COMMUNITY): Payer: Medicare HMO

## 2019-10-25 ENCOUNTER — Other Ambulatory Visit: Payer: Self-pay

## 2019-10-25 ENCOUNTER — Encounter (HOSPITAL_COMMUNITY): Payer: Self-pay | Admitting: Emergency Medicine

## 2019-10-25 DIAGNOSIS — R9082 White matter disease, unspecified: Secondary | ICD-10-CM | POA: Diagnosis not present

## 2019-10-25 DIAGNOSIS — I6782 Cerebral ischemia: Secondary | ICD-10-CM | POA: Diagnosis not present

## 2019-10-25 DIAGNOSIS — H579 Unspecified disorder of eye and adnexa: Secondary | ICD-10-CM | POA: Insufficient documentation

## 2019-10-25 DIAGNOSIS — H538 Other visual disturbances: Secondary | ICD-10-CM | POA: Diagnosis not present

## 2019-10-25 DIAGNOSIS — Z5321 Procedure and treatment not carried out due to patient leaving prior to being seen by health care provider: Secondary | ICD-10-CM | POA: Insufficient documentation

## 2019-10-25 DIAGNOSIS — I672 Cerebral atherosclerosis: Secondary | ICD-10-CM | POA: Diagnosis not present

## 2019-10-25 LAB — BASIC METABOLIC PANEL
Anion gap: 12 (ref 5–15)
BUN: 13 mg/dL (ref 8–23)
CO2: 23 mmol/L (ref 22–32)
Calcium: 9.7 mg/dL (ref 8.9–10.3)
Chloride: 106 mmol/L (ref 98–111)
Creatinine, Ser: 1.1 mg/dL — ABNORMAL HIGH (ref 0.44–1.00)
GFR calc Af Amer: 56 mL/min — ABNORMAL LOW (ref >60)
GFR calc non Af Amer: 48 mL/min — ABNORMAL LOW (ref >60)
Glucose, Bld: 107 mg/dL — ABNORMAL HIGH (ref 70–99)
Potassium: 3.7 mmol/L (ref 3.5–5.1)
Sodium: 141 mmol/L (ref 135–145)

## 2019-10-25 LAB — CBC
HCT: 42.3 % (ref 36.0–46.0)
Hemoglobin: 13.5 g/dL (ref 12.0–15.0)
MCH: 31.4 pg (ref 26.0–34.0)
MCHC: 31.9 g/dL (ref 30.0–36.0)
MCV: 98.4 fL (ref 80.0–100.0)
Platelets: 272 10*3/uL (ref 150–400)
RBC: 4.3 MIL/uL (ref 3.87–5.11)
RDW: 13 % (ref 11.5–15.5)
WBC: 7.5 10*3/uL (ref 4.0–10.5)
nRBC: 0 % (ref 0.0–0.2)

## 2019-10-25 MED ORDER — SODIUM CHLORIDE 0.9% FLUSH: 3.0000 mL | Freq: Once | INTRAVENOUS | Status: DC

## 2019-10-25 NOTE — ED Notes (Signed)
Pt's daughter is very upset that her mother is not being seen by a provider at this time.   SHe informed RN that Dr. Geryl Councilman (Eye Dr.) wanted her to have a stroke workup.  RN took pt back to triage, she is completely NIH negative, with no complaints.    RN spoke to [employer], we have not received a call from Dr. Geryl Councilman requesting any workup.  Daughter called Dr. Geryl Councilman who confirmed that she could be having "a stroke in her right eye."  RN suggested she speak to provider, phone number was given.  Will continue to monitor.

## 2019-10-25 NOTE — ED Triage Notes (Signed)
Patient arrives to ED with complaints that her eye doctor sent her to ED for possible blockage in one of her eyes per patient. MD sent her here for MRI.  Patient has no complaints or eye pain or vision deficients. Patient was at eye doctor for a routine check up. Patient has no weakness or speech deficients.

## 2019-10-26 ENCOUNTER — Telehealth: Payer: Self-pay

## 2019-10-26 ENCOUNTER — Other Ambulatory Visit: Payer: Self-pay | Admitting: Cardiology

## 2019-10-26 ENCOUNTER — Ambulatory Visit
Admission: RE | Admit: 2019-10-26 | Discharge: 2019-10-26 | Disposition: A | Discharge status: Home / Self Care | Payer: Medicare HMO | Source: Ambulatory Visit | Attending: Cardiology | Admitting: Cardiology

## 2019-10-26 DIAGNOSIS — Z8673 Personal history of transient ischemic attack (TIA), and cerebral infarction without residual deficits: Secondary | ICD-10-CM

## 2019-10-26 DIAGNOSIS — I6523 Occlusion and stenosis of bilateral carotid arteries: Secondary | ICD-10-CM

## 2019-10-26 DIAGNOSIS — I672 Cerebral atherosclerosis: Secondary | ICD-10-CM | POA: Diagnosis not present

## 2019-10-26 DIAGNOSIS — I63233 Cerebral infarction due to unspecified occlusion or stenosis of bilateral carotid arteries: Secondary | ICD-10-CM | POA: Diagnosis not present

## 2019-10-26 DIAGNOSIS — I771 Stricture of artery: Secondary | ICD-10-CM | POA: Diagnosis not present

## 2019-10-26 DIAGNOSIS — I6503 Occlusion and stenosis of bilateral vertebral arteries: Secondary | ICD-10-CM | POA: Diagnosis not present

## 2019-10-26 MED ORDER — IOPAMIDOL (ISOVUE-370) INJECTION 76%
75.0000 mL | Freq: Once | INTRAVENOUS | Status: AC | PRN
  Administered 2019-10-26: 75 mL via INTRAVENOUS

## 2019-10-26 NOTE — Telephone Encounter (Signed)
I have sent a message to Dr. Donzetta Matters. Please follow up in the coming week.  Thanks MJP

## 2019-10-26 NOTE — ED Notes (Signed)
Pt complaining of swelling to legs, triage nurse aware.

## 2019-10-26 NOTE — ED Notes (Signed)
Pt stated she spoke with her PCP just now and her Dr stated she can go home. Pt stated she is leaving.

## 2019-10-26 NOTE — Telephone Encounter (Signed)
Ok    (note to self)

## 2019-10-26 NOTE — Telephone Encounter (Signed)
Dr Virgina Jock,   Lynn Carey has been approved for CT Angio Neck w / wo contrast. Lynn Carey has been contacted. Patient will have STAT imaging today.   I contacted office of Dr Donzetta Matters.  I was unsuccessful of getting patient in per your request.  Receptionist stated referral was entered incorrectly.  Referral reflects [facility], however DX/Procedure reflects a different department. Rise Paganini is looking into this issue).   I explained urgency of appointment to Dr Jamse Mead receptionist and I was still unsuccessful in booking even tho patient has established care within their practice.    Thank you,   -Leda Quail

## 2019-10-26 NOTE — Telephone Encounter (Signed)
Pt went to the ER yesterday due that her eye doctor told her she possibly had a stroke due that he saw something in her eye. Pt daughter mention pt had been in the ER since 5 o'clock yesterday. Pt has not been seen by anyone and has been sitting in a chair, pt daughter mention she had a CT scan and blood work. Pt daughter would like to know if pt could go home due that pt is tired.     Willette Cluster [phone]

## 2019-10-26 NOTE — Telephone Encounter (Addendum)
Spoke with patient's daughter over the phone.  Noted recent ophthalmology visit there is concern for an "eye stroke" was raised.  Patient currently has no other symptoms.  She has reportedly been in triage overnight and would like to go home if possible.  CT head 7/21 showed no acute CT finding. Mild chronic small-vessel ischemic change of the hemispheric white matter. Old right parietal cortical in subcortical infarction.  Patient does have known severe bilateral carotid stenosis which was treated medically in absence of symptoms and renal dysfunction in the past.  I will obtain urgent CT angiogram and referral to Dr. Donzetta Matters who previously saw the patient.  Time spent: 8 min  [place], MD [facility] Pager: [phone] Office: [phone]

## 2019-10-29 NOTE — Progress Notes (Signed)
Left carotid stenosis is severe, but stable.Please check with the patient which eye did she have problems noted in. Please follow up with Dr. Claretha Cooper office when she can be seen.  Thanks MJP

## 2019-10-30 ENCOUNTER — Other Ambulatory Visit: Payer: Self-pay | Admitting: Cardiology

## 2019-10-30 DIAGNOSIS — E782 Mixed hyperlipidemia: Secondary | ICD-10-CM

## 2019-10-30 DIAGNOSIS — I255 Ischemic cardiomyopathy: Secondary | ICD-10-CM

## 2019-10-31 ENCOUNTER — Telehealth: Payer: Self-pay

## 2019-10-31 NOTE — Telephone Encounter (Signed)
CT of the head and neck is very stable compared to the previous study dated September 2019.  I do not know all the details of why this test was ordered, Dr. Virgina Jock will discuss further.

## 2019-11-02 ENCOUNTER — Other Ambulatory Visit: Payer: Self-pay

## 2019-11-02 ENCOUNTER — Other Ambulatory Visit: Payer: Self-pay | Admitting: Cardiology

## 2019-11-02 DIAGNOSIS — I10 Essential (primary) hypertension: Secondary | ICD-10-CM

## 2019-11-02 DIAGNOSIS — I6523 Occlusion and stenosis of bilateral carotid arteries: Secondary | ICD-10-CM

## 2019-11-02 NOTE — Telephone Encounter (Signed)
Ailsa has been sch with Dr Donzetta Matters for 8/2 at 9:00am- pt will have a carotid and will see Dr Donzetta Matters right after.  Lynnette has been updated and agrees.   Thank you. Leda Quail

## 2019-11-03 ENCOUNTER — Other Ambulatory Visit: Payer: Self-pay | Admitting: Cardiology

## 2019-11-03 NOTE — Telephone Encounter (Signed)
-----   Message from [place] sent at 10/31/2019 10:39 AM EDT ----- Daughter called earlier this morning wanting to speak to MP. A message was sent to [facility] and ST for review.   Please call Daughter Jamey Ripa. She is requesting results from MP.    Thank you  -Leda Quail  ----- Message ----- From: [name], MD Sent: 10/29/2019   2:11 PM EDT To: [facility], #  Left carotid stenosis is severe, but stable.Please check with the patient which eye did she have problems noted in. Please follow up with Dr. Claretha Cooper office when she can be seen.  Thanks MJP

## 2019-11-03 NOTE — Telephone Encounter (Signed)
Discussed results with patient. She verbalized understanding. 

## 2019-11-04 DIAGNOSIS — I10 Essential (primary) hypertension: Secondary | ICD-10-CM | POA: Diagnosis not present

## 2019-11-06 ENCOUNTER — Ambulatory Visit (HOSPITAL_COMMUNITY)
Admission: RE | Admit: 2019-11-06 | Discharge: 2019-11-06 | Disposition: A | Discharge status: Home / Self Care | Payer: Medicare HMO | Source: Ambulatory Visit | Attending: Vascular Surgery | Admitting: Vascular Surgery

## 2019-11-06 ENCOUNTER — Ambulatory Visit (INDEPENDENT_AMBULATORY_CARE_PROVIDER_SITE_OTHER): Payer: Medicare HMO | Admitting: Physician Assistant

## 2019-11-06 ENCOUNTER — Other Ambulatory Visit: Payer: Self-pay

## 2019-11-06 VITALS — BP 163/87 | HR 74 | Temp 97.3°F | Resp 20 | Ht 59.0 in | Wt 146.6 lb

## 2019-11-06 DIAGNOSIS — I6523 Occlusion and stenosis of bilateral carotid arteries: Secondary | ICD-10-CM | POA: Diagnosis not present

## 2019-11-06 NOTE — Progress Notes (Signed)
Established Carotid Patient   History of Present Illness   Lynn Carey is a 79 y.o. (08-14-1940) female who presents to go over carotid studies.  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  She was evaluated by her cardiologist Dr. Virgina Jock who is following her for carotid artery stenosis.  Work-up included CTA head and neck which did not show any significant stenosis of right ICA however left ICA stenosis is estimated to be about 65 to 75% and stable.  She denies any strokelike symptoms including slurring speech, drastic vision changes, or one-sided weakness.  She denies tobacco use.  One of her daughters is with [place] in person during office visit and another daughter is on speaker phone.  The patient's PMH, PSH, SH, and FamHx were reviewed and are unchanged from prior visit.  Current Outpatient Medications  Medication Sig Dispense Refill  . acetaminophen (TYLENOL) 650 MG CR tablet Take 650 mg by mouth every 8 (eight) hours as needed for pain.    Marland Kitchen albuterol (VENTOLIN HFA) 108 (90 Base) MCG/ACT inhaler Inhale 2 puffs into the lungs every 4 (four) hours as needed for wheezing or shortness of breath. 6.7 g 3  . amLODipine (NORVASC) 2.5 MG tablet TAKE 1 TABLET(2.5 MG) BY MOUTH DAILY 30 tablet 5  . BIDIL 20-37.5 MG tablet TAKE 2 TABLETS BY MOUTH THREE TIMES DAILY 180 tablet 0  . Cholecalciferol (VITAMIN D) 2000 UNITS CAPS Take 2,000 Units by mouth daily.     . clopidogrel (PLAVIX) 75 MG tablet TAKE 1 TABLET EVERY DAY 90 tablet 0  . ezetimibe (ZETIA) 10 MG tablet TAKE 1 TABLET EVERY DAY 90 tablet 2  . famotidine (PEPCID) 20 MG tablet Take 20 mg by mouth at bedtime.    . furosemide (LASIX) 40 MG tablet Take 1 tablet (40 mg total) by mouth daily. 90 tablet 3  . gabapentin (NEURONTIN) 300 MG capsule Take 1 capsule (300 mg total) by mouth 4 (four) times daily. 120 capsule 2  . metoprolol succinate (TOPROL-XL) 50  MG 24 hr tablet Take 1 tablet (50 mg total) by mouth daily. Take with or immediately following a meal. 30 tablet 2  . montelukast (SINGULAIR) 10 MG tablet Take 10 mg by mouth at bedtime.    . nitroGLYCERIN (NITROSTAT) 0.4 MG SL tablet Place 0.4 mg under the tongue every 5 (five) minutes as needed for chest pain.    . pantoprazole (PROTONIX) 40 MG tablet TAKE 1 TABLET EVERY DAY 90 tablet 1  . ranolazine (RANEXA) 500 MG 12 hr tablet Take 2 tablets (1,000 mg total) by mouth 2 (two) times daily. (Patient taking differently: Take 500 mg by mouth 2 (two) times daily. ) 60 tablet 6  . rosuvastatin (CRESTOR) 40 MG tablet TAKE 1/2 TABLET EVERY DAY 45 tablet 3  . sacubitril-valsartan (ENTRESTO) 97-103 MG Take 1 tablet by mouth 2 (two) times daily. 120 tablet 3  . sitaGLIPtin (JANUVIA) 50 MG tablet Take 1 tablet (50 mg total) by mouth daily. (Patient taking differently: Take 50 mg by mouth daily. 3 TIMES A WEEK) 90 tablet 1  . spironolactone (ALDACTONE) 50 MG tablet TAKE 1 TABLET BY MOUTH EVERY DAY (Patient taking differently: Take 50 mg by mouth daily at 12 noon. ) 90 tablet 2  . temazepam (RESTORIL) 15 MG capsule Take 15 mg by mouth at bedtime as needed for sleep.  1  . traMADol (ULTRAM)  50 MG tablet Take 1 tablet (50 mg total) by mouth every 6 (six) hours as needed. 20 tablet 0   No current facility-administered medications for this visit.    REVIEW OF SYSTEMS (negative unless checked):   Cardiac:  []  Chest pain or chest pressure? []  Shortness of breath upon activity? []  Shortness of breath when lying flat? []  Irregular heart rhythm?  Vascular:  []  Pain in calf, thigh, or hip brought on by walking? []  Pain in feet at night that wakes you up from your sleep? []  Blood clot in your veins? []  Leg swelling?  Pulmonary:  []  Oxygen at home? []  Productive cough? []  Wheezing?  Neurologic:  []  Sudden weakness in arms or legs? []  Sudden numbness in arms or legs? []  Sudden onset of difficult  speaking or slurred speech? []  Temporary loss of vision in one eye? []  Problems with dizziness?  Gastrointestinal:  []  Blood in stool? []  Vomited blood?  Genitourinary:  []  Burning when urinating? []  Blood in urine?  Psychiatric:  []  Major depression  Hematologic:  []  Bleeding problems? []  Problems with blood clotting?  Dermatologic:  []  Rashes or ulcers?  Constitutional:  []  Fever or chills?  Ear/Nose/Throat:  []  Change in hearing? []  Nose bleeds? []  Sore throat?  Musculoskeletal:  []  Back pain? []  Joint pain? []  Muscle pain?   Physical Examination   Vitals:   11/06/19 0941 11/06/19 0943  BP: (!) 167/86 (!) 163/87  Pulse: 74   Resp: 20   Temp: (!) 97.3 F (36.3 C)   TempSrc: Temporal   SpO2: 100%   Weight: 146 lb 9.6 oz (66.5 kg)   Height: 4\' 11"  (1.499 m)    Body mass index is 29.61 kg/m.  General:  WDWN in NAD; vital signs documented above Gait: Not observed HENT: WNL, normocephalic Pulmonary: normal non-labored breathing Cardiac: regular HR Abdomen: soft, NT, no masses Skin: without rashes Extremities: without ischemic changes, without Gangrene , without cellulitis; without open wounds;  Musculoskeletal: no muscle wasting or atrophy  Neurologic: A&O X 3;  No focal weakness or paresthesias are detected; CN grossly intact Psychiatric:  The pt has Normal affect.  Non-Invasive Vascular Imaging   B Carotid Duplex :   R ICA stenosis:  40-59%  R VA:  patent and antegrade  L ICA stenosis:  40-59%  L VA:  patent and antegrade   Medical Decision Making   Lynn Carey is a 79 y.o. female who presents to go over carotid artery stenosis   Carotid duplex essentially unchanged from study performed 6 months ago  Recent CT angio does not demonstrate any hemodynamically significant stenosis of right ICA, and estimates left ICA stenosis to be 65 to 75% which is considered asymptomatic  No indication for revascularization of right or left ICA  at this time  Patient would prefer Dr. Virgina Jock to continue to follow carotid artery stenosis and has a repeat carotid ultrasound in October of this year scheduled; if patient requires revascularization in the future she would prefer Dr. Donzetta Matters to do the surgery  She may follow-up on an as-needed basis   Dagoberto Ligas PA-C [facility] Office: [facility] MD: Trula Slade

## 2019-11-11 ENCOUNTER — Telehealth: Payer: Self-pay | Admitting: Cardiology

## 2019-11-13 NOTE — Telephone Encounter (Signed)
Called patient, NA, Vmbox is full, cannot accept any messages at this time.

## 2019-11-20 ENCOUNTER — Ambulatory Visit: Payer: Medicare HMO | Admitting: Internal Medicine

## 2019-11-21 ENCOUNTER — Other Ambulatory Visit: Payer: Self-pay | Admitting: Cardiology

## 2019-11-21 DIAGNOSIS — I5022 Chronic systolic (congestive) heart failure: Secondary | ICD-10-CM

## 2019-11-22 ENCOUNTER — Ambulatory Visit: Payer: Medicare HMO | Admitting: Cardiology

## 2019-11-24 DIAGNOSIS — H40053 Ocular hypertension, bilateral: Secondary | ICD-10-CM | POA: Diagnosis not present

## 2019-11-24 DIAGNOSIS — H35351 Cystoid macular degeneration, right eye: Secondary | ICD-10-CM | POA: Diagnosis not present

## 2019-11-30 ENCOUNTER — Encounter: Payer: Self-pay | Admitting: Cardiology

## 2019-11-30 ENCOUNTER — Other Ambulatory Visit: Payer: Self-pay

## 2019-11-30 ENCOUNTER — Ambulatory Visit: Payer: Medicare HMO | Admitting: Cardiology

## 2019-11-30 VITALS — BP 137/69 | HR 84 | Resp 15 | Ht 59.0 in | Wt 146.0 lb

## 2019-11-30 DIAGNOSIS — I5022 Chronic systolic (congestive) heart failure: Secondary | ICD-10-CM | POA: Diagnosis not present

## 2019-11-30 DIAGNOSIS — Z95810 Presence of automatic (implantable) cardiac defibrillator: Secondary | ICD-10-CM

## 2019-11-30 DIAGNOSIS — I25810 Atherosclerosis of coronary artery bypass graft(s) without angina pectoris: Secondary | ICD-10-CM | POA: Diagnosis not present

## 2019-11-30 DIAGNOSIS — I6523 Occlusion and stenosis of bilateral carotid arteries: Secondary | ICD-10-CM | POA: Diagnosis not present

## 2019-11-30 DIAGNOSIS — I10 Essential (primary) hypertension: Secondary | ICD-10-CM
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
## 2019-12-03 ENCOUNTER — Encounter: Payer: Self-pay | Admitting: Cardiology

## 2019-12-04 DIAGNOSIS — Z20822 Contact with and (suspected) exposure to covid-19: Secondary | ICD-10-CM | POA: Diagnosis not present

## 2019-12-05 DIAGNOSIS — I10 Essential (primary) hypertension: Secondary | ICD-10-CM | POA: Diagnosis not present

## 2019-12-12 ENCOUNTER — Encounter: Payer: Medicare HMO | Admitting: Internal Medicine

## 2020-01-04 DIAGNOSIS — I10 Essential (primary) hypertension: Secondary | ICD-10-CM | POA: Diagnosis not present

## 2020-01-05 ENCOUNTER — Ambulatory Visit: Payer: Medicare HMO | Admitting: Internal Medicine

## 2020-01-15 ENCOUNTER — Telehealth: Payer: Self-pay

## 2020-01-15 NOTE — Telephone Encounter (Signed)
I called the pt to let her know that she hasn't had her flu shot this flu season and to reschedule her last appt she had to cancel.

## 2020-01-19 ENCOUNTER — Ambulatory Visit (INDEPENDENT_AMBULATORY_CARE_PROVIDER_SITE_OTHER): Payer: Medicare HMO

## 2020-01-19 ENCOUNTER — Other Ambulatory Visit: Payer: Self-pay

## 2020-01-19 ENCOUNTER — Ambulatory Visit
Admission: EM | Admit: 2020-01-19 | Discharge: 2020-01-19 | Disposition: A | Discharge status: Home / Self Care | Payer: Medicare HMO | Attending: Physician Assistant | Admitting: Physician Assistant

## 2020-01-19 ENCOUNTER — Telehealth: Payer: Self-pay

## 2020-01-19 DIAGNOSIS — J209 Acute bronchitis, unspecified: Secondary | ICD-10-CM

## 2020-01-19 DIAGNOSIS — Z4502 Encounter for adjustment and management of automatic implantable cardiac defibrillator: Secondary | ICD-10-CM | POA: Diagnosis not present

## 2020-01-19 DIAGNOSIS — R059 Cough, unspecified: Secondary | ICD-10-CM | POA: Diagnosis not present

## 2020-01-19 DIAGNOSIS — I5022 Chronic systolic (congestive) heart failure: Secondary | ICD-10-CM | POA: Diagnosis not present

## 2020-01-19 DIAGNOSIS — I509 Heart failure, unspecified: Secondary | ICD-10-CM

## 2020-01-19 DIAGNOSIS — Z8709 Personal history of other diseases of the respiratory system: Secondary | ICD-10-CM | POA: Diagnosis not present

## 2020-01-19 DIAGNOSIS — Z20822 Contact with and (suspected) exposure to covid-19: Secondary | ICD-10-CM

## 2020-01-19 DIAGNOSIS — Z95810 Presence of automatic (implantable) cardiac defibrillator: Secondary | ICD-10-CM | POA: Diagnosis not present

## 2020-01-19 DIAGNOSIS — I25810 Atherosclerosis of coronary artery bypass graft(s) without angina pectoris: Secondary | ICD-10-CM | POA: Diagnosis not present

## 2020-01-19 DIAGNOSIS — I6523 Occlusion and stenosis of bilateral carotid arteries: Secondary | ICD-10-CM | POA: Diagnosis not present

## 2020-01-19 MED ORDER — MONTELUKAST SODIUM 10 MG PO TABS: 10.0000 mg | ORAL_TABLET | Freq: Every day | ORAL | 0 refills | Status: DC

## 2020-01-19 MED ORDER — PREDNISONE 50 MG PO TABS
50.0000 mg | ORAL_TABLET | Freq: Every day | ORAL | 0 refills | Status: DC
Start: 2020-01-19 — End: 2020-01-19

## 2020-01-19 MED ORDER — DOXYCYCLINE HYCLATE 100 MG PO CAPS
100.0000 mg | ORAL_CAPSULE | Freq: Two times a day (BID) | ORAL | 0 refills | Status: DC
Start: 2020-01-19 — End: 2020-01-19

## 2020-01-19 MED ORDER — DOXYCYCLINE HYCLATE 100 MG PO CAPS: 100.0000 mg | ORAL_CAPSULE | Freq: Two times a day (BID) | ORAL | 0 refills | Status: DC

## 2020-01-19 MED ORDER — PREDNISONE 50 MG PO TABS: 50.0000 mg | ORAL_TABLET | Freq: Every day | ORAL | 0 refills | Status: DC

## 2020-01-19 NOTE — Discharge Instructions (Addendum)
COVID PCR testing ordered. I would like you to quarantine until testing results. Your chest xray was negative for pneumonia. Start prednisone and doxycycline for cough/bronchitis. Continue albuterol as needed. I have refilled 30 days of singulair. Tylenol/motrin for pain and fever. Keep hydrated, urine should be clear to pale yellow in color. If experiencing shortness of breath, trouble breathing, go to the emergency department for further evaluation needed.

## 2020-01-19 NOTE — ED Provider Notes (Signed)
[facility]    CSN: [id number] Arrival date & time: 01/19/20  0941      History   Chief Complaint Chief Complaint  Patient presents with  . Cough    HPI Lynn Carey is a 79 y.o. female.   79 year old female with history of asthma, CHF, CAD, DM, HTN comes in for 6 day of URI symptoms. Productive cough, nasal congestion, sore throat. Denies fever, chills, body aches. Denies chest pain, orthopnea, leg swelling.  Denies shortness of breath, loss of taste/smell. COVID vaccinated with booster 3 days ago. Flu shot yesterday. Former smoker     Past Medical History:  Diagnosis Date  . Anginal pain ([place])    h/o, denies today   . Arthritis    hip - both, shot in L shoulder- 2 weeks ago  . Asthma   . Carotid artery occlusion   . CHF (congestive heart failure) ([place])   . Chronic systolic heart failure ([place]) 09/19/2018  . Complication of anesthesia   . Coronary artery disease   . Diabetes mellitus without complication ([place])   . Encounter for assessment of implantable cardioverter-defibrillator (ICD) 08/30/2019  . GERD (gastroesophageal reflux disease)   . Hypertension    followed by Dr. Einar Gip  . ICD Single chamber Medtronic VISIA MRI VR [id number] 04/21/2019 04/21/2019  . MYOCARDIAL INFARCTION 05/25/2007   Qualifier: History of  By: Milana Obey, Doroteo Bradford    . Myocardial infarction ([place]) 1997  . PONV (postoperative nausea and vomiting)   . Shortness of breath     Patient Active Problem List   Diagnosis Date Noted  . Encounter for assessment of implantable cardioverter-defibrillator (ICD) 08/30/2019  . ICD Single chamber Medtronic VISIA MRI VR [id number] 04/21/2019 04/21/2019  . Chronic systolic heart failure ([place]) 09/19/2018  . Ischemic cardiomyopathy 07/14/2018  . Coronary artery disease involving coronary bypass graft of native heart without angina pectoris 07/14/2018  . Bilateral carotid artery stenosis 07/14/2018  . Neuropathy 02/14/2014  . Degenerative joint disease  (DJD) of hip 01/30/2014  . Chronic cough 05/26/2013  . Coronary atherosclerosis of native coronary artery 01/23/2013  . Constipation 12/20/2012  . DM (diabetes mellitus) ([place]) 12/20/2012  . GERD (gastroesophageal reflux disease) 12/20/2012  . Osteoarthritis of hip 12/13/2012    Class: Chronic  . Mixed hyperlipidemia 05/25/2007  . Essential hypertension 05/25/2007  . Allergic rhinitis 05/25/2007  . Asthma 05/25/2007    Past Surgical History:  Procedure Laterality Date  . CARDIAC CATHETERIZATION  05/2012   see note  . CORONARY ARTERY BYPASS GRAFT  [address]  . CORONARY STENT INTERVENTION Right 06/18/2017   Procedure: CORONARY STENT INTERVENTION;  Surgeon: Adrian Prows, MD;  Location: [place] [place] [facility];  Service: Cardiovascular;  Laterality: Right;  . EYE SURGERY     both eyes, laser procedure  . ICD IMPLANT N/A 04/21/2019   Procedure: ICD IMPLANT;  Surgeon: Deboraha Sprang, MD;  Location: [place] [facility];  Service: Cardiovascular;  Laterality: N/A;  . JOINT REPLACEMENT Left 12/13/12   hip  . LEFT HEART CATH AND CORONARY ANGIOGRAPHY N/A 11/25/2017   Procedure: LEFT HEART CATH AND CORONARY ANGIOGRAPHY;  Surgeon: [name], MD;  Location: [place] [facility];  Service: Cardiovascular;  Laterality: N/A;  . LEFT HEART CATH AND CORS/GRAFTS ANGIOGRAPHY N/A 06/18/2017   Procedure: LEFT HEART CATH AND CORS/GRAFTS ANGIOGRAPHY;  Surgeon: Adrian Prows, MD;  Location: [place] [facility];  Service: Cardiovascular;  Laterality: N/A;  . LEFT HEART  CATHETERIZATION WITH CORONARY/GRAFT ANGIOGRAM N/A 04/21/2011   Procedure: LEFT HEART CATHETERIZATION WITH Beatrix Fetters;  Surgeon: Laverda Page, MD;  Location: [facility]ATH LAB;  Service: Cardiovascular;  Laterality: N/A;  . PERCUTANEOUS CORONARY INTERVENTION-BALLOON ONLY Right 05/12/2011   Procedure: PERCUTANEOUS CORONARY INTERVENTION-BALLOON ONLY;  Surgeon: Laverda Page, MD;  Location: [facility] CATH LAB;  Service: Cardiovascular;   Laterality: Right;  . TOTAL HIP ARTHROPLASTY Left 12/13/2012   Procedure: TOTAL HIP ARTHROPLASTY ANTERIOR APPROACH;  Surgeon: Hessie Dibble, MD;  Location: [place];  Service: Orthopedics;  Laterality: Left;  . TOTAL HIP ARTHROPLASTY Right 01/30/2014   Procedure: TOTAL HIP ARTHROPLASTY ANTERIOR APPROACH;  Surgeon: Hessie Dibble, MD;  Location: [place];  Service: Orthopedics;  Laterality: Right;  . TUBAL LIGATION      OB History   No obstetric history on file.      Home Medications    Prior to Admission medications   Medication Sig Start Date End Date Taking? Authorizing Provider  acetaminophen (TYLENOL) 650 MG CR tablet Take 650 mg by mouth every 8 (eight) hours as needed for pain.    [provider]  albuterol (VENTOLIN HFA) 108 (90 Base) MCG/ACT inhaler Inhale 2 puffs into the lungs every 4 (four) hours as needed for wheezing or shortness of breath. 11/07/18   Glendale Chard, MD  amLODipine (NORVASC) 2.5 MG tablet TAKE 1 TABLET(2.5 MG) BY MOUTH DAILY 11/02/19   Patwardhan, Manish J, MD  BIDIL 20-37.5 MG tablet TAKE 2 TABLETS BY MOUTH THREE TIMES DAILY 08/02/19   Patwardhan, Reynold Bowen, MD  Cholecalciferol (VITAMIN D) 2000 UNITS CAPS Take 2,000 Units by mouth daily.     [provider]  clopidogrel (PLAVIX) 75 MG tablet TAKE 1 TABLET EVERY DAY 11/03/19   Patwardhan, Manish J, MD  doxycycline (VIBRAMYCIN) 100 MG capsule Take 1 capsule (100 mg total) by mouth 2 (two) times daily. 01/19/20   Tasia Catchings, Gavan Nordby V, PA-C  ezetimibe (ZETIA) 10 MG tablet TAKE 1 TABLET EVERY DAY 08/07/19   Glendale Chard, MD  famotidine (PEPCID) 20 MG tablet Take 20 mg by mouth at bedtime. 09/13/19   [provider]  furosemide (LASIX) 40 MG tablet TAKE 1 TABLET(40 MG) BY MOUTH DAILY 11/21/19   Patwardhan, Manish J, MD  gabapentin (NEURONTIN) 300 MG capsule Take 1 capsule (300 mg total) by mouth 4 (four) times daily. 06/20/19   Tanda Rockers, MD  metoprolol succinate (TOPROL-XL) 50 MG 24 hr tablet Take 1  tablet (50 mg total) by mouth daily. Take with or immediately following a meal. 10/05/18   Patwardhan, Manish J, MD  montelukast (SINGULAIR) 10 MG tablet Take 1 tablet (10 mg total) by mouth at bedtime. 01/19/20   Tasia Catchings, Hydie Langan V, PA-C  nitroGLYCERIN (NITROSTAT) 0.4 MG SL tablet Place 0.4 mg under the tongue every 5 (five) minutes as needed for chest pain.    [provider]  pantoprazole (PROTONIX) 40 MG tablet TAKE 1 TABLET EVERY DAY 08/07/19   Glendale Chard, MD  predniSONE (DELTASONE) 50 MG tablet Take 1 tablet (50 mg total) by mouth daily with breakfast. 01/19/20   Tasia Catchings, Lark Langenfeld V, PA-C  ranolazine (RANEXA) 500 MG 12 hr tablet Take 2 tablets (1,000 mg total) by mouth 2 (two) times daily. Patient taking differently: Take 500 mg by mouth 2 (two) times daily.  11/15/18   Patwardhan, Reynold Bowen, MD  rosuvastatin (CRESTOR) 40 MG tablet TAKE 1/2 TABLET EVERY DAY 10/31/19   Adrian Prows, MD  sacubitril-valsartan (ENTRESTO) 97-103 MG  Take 1 tablet by mouth 2 (two) times daily. 06/14/19   Patwardhan, Manish J, MD  sitaGLIPtin (JANUVIA) 50 MG tablet Take 1 tablet (50 mg total) by mouth daily. Patient taking differently: Take 50 mg by mouth daily. 3 TIMES A WEEK 07/28/19   Glendale Chard, MD  spironolactone (ALDACTONE) 50 MG tablet TAKE 1 TABLET BY MOUTH EVERY DAY Patient taking differently: Take 50 mg by mouth daily at 12 noon.  02/13/19   Patwardhan, Manish J, MD  temazepam (RESTORIL) 15 MG capsule Take 15 mg by mouth at bedtime as needed for sleep. 05/20/17   [provider]    Family History Family History  Problem Relation Age of Onset  . Allergies Mother   . Asthma Mother   . Heart disease Mother   . Heart attack Mother   . Allergies Son   . Asthma Son   . Heart disease Father   . Heart attack Father   . Breast cancer Daughter 63    Social History Social History   Tobacco Use  . Smoking status: Former Smoker    Packs/day: 0.50    Years: 2.00    Pack years: 1.00    Types: Cigarettes     Quit date: 12/08/1983    Years since quitting: 36.1  . Smokeless tobacco: Never Used  Vaping Use  . Vaping Use: Never used  Substance Use Topics  . Alcohol use: No  . Drug use: No     Allergies   Lisinopril   Review of Systems Review of Systems  Reason unable to perform ROS: See HPI as above.     Physical Exam Triage Vital Signs ED Triage Vitals  Enc Vitals Group     BP 01/19/20 1023 (!) 161/80     Pulse Rate 01/19/20 1023 96     Resp 01/19/20 1023 18     Temp 01/19/20 1023 99 F (37.2 C)     Temp Source 01/19/20 1023 Oral     SpO2 01/19/20 1023 95 %     Weight --      Height --      Head Circumference --      Peak Flow --      Pain Score 01/19/20 1024 0     Pain Loc --      Pain Edu? --      Excl. in [place]? --    No data found.  Updated Vital Signs BP (!) 161/80   Pulse 96   Temp 99 F (37.2 C) (Oral)   Resp 18   SpO2 95%   Physical Exam Constitutional:      General: She is not in acute distress.    Appearance: Normal appearance. She is not ill-appearing, toxic-appearing or diaphoretic.  HENT:     Head: Normocephalic and atraumatic.     Mouth/Throat:     Mouth: Mucous membranes are moist.     Pharynx: Oropharynx is clear. Uvula midline.  Cardiovascular:     Rate and Rhythm: Normal rate and regular rhythm.     Heart sounds: Normal heart sounds. No murmur heard.  No friction rub. No gallop.   Pulmonary:     Effort: Pulmonary effort is normal. No accessory muscle usage, prolonged expiration, respiratory distress or retractions.     Comments: Mild rhonchi to the upper lobes bilaterally.  Musculoskeletal:     Cervical back: Normal range of motion and neck supple.     Right lower leg: No edema.  Left lower leg: No edema.  Skin:    General: Skin is warm and dry.  Neurological:     General: No focal deficit present.     Mental Status: She is alert and oriented to person, place, and time.      UC Treatments / Results  Labs (all labs ordered are  listed, but only abnormal results are displayed) Labs Reviewed  NOVEL CORONAVIRUS, NAA    EKG   Radiology DG Chest 2 View  Result Date: 01/19/2020 CLINICAL DATA:  Productive cough.  History of bronchitis and CHF. EXAM: CHEST - 2 VIEW COMPARISON:  04/21/2019 FINDINGS: Left chest wall ICD is noted with lead in the right ventricle. Previous median sternotomy and CABG procedure. Stable cardiomediastinal contours. No pleural effusion or edema. No airspace densities. Mild thoracic spondylosis. IMPRESSION: No active cardiopulmonary abnormalities. Electronically Signed   By: Kerby Moors M.D.   On: 01/19/2020 10:58    Procedures Procedures (including critical care time)  Medications Ordered in UC Medications - No data to display  Initial Impression / Assessment and Plan / UC Course  I have reviewed the triage vital signs and the nursing notes.  Pertinent labs & imaging results that were available during my care of the patient were reviewed by me and considered in my medical decision making (see chart for details).    Chest x-ray negative for active cardiopulmonary disease.  Covid testing ordered, to quarantine until testing results return.  Although not diagnosed formally, will cover for COPD exacerbation due to smoking history.  Prednisone, doxycycline as directed.  Return precautions given.  Patient expresses understanding and agrees to plan.  Final Clinical Impressions(s) / UC Diagnoses   Final diagnoses:  Encounter for screening laboratory testing for COVID-19 virus  Acute bronchitis, unspecified organism   ED Prescriptions    Medication Sig Dispense Auth. Provider   predniSONE (DELTASONE) 50 MG tablet Take 1 tablet (50 mg total) by mouth daily with breakfast. 5 tablet Anasophia Pecor V, PA-C   doxycycline (VIBRAMYCIN) 100 MG capsule Take 1 capsule (100 mg total) by mouth 2 (two) times daily. 14 capsule Harrison Paulson V, PA-C   montelukast (SINGULAIR) 10 MG tablet Take 1 tablet (10 mg total) by  mouth at bedtime. 30 tablet Ok Edwards, PA-C     PDMP not reviewed this encounter.   Ok Edwards, PA-C 01/19/20 1714

## 2020-01-19 NOTE — ED Triage Notes (Addendum)
Pt c/o productive cough with yellow sputum, nasal congestion, and sore throat since Sunday. States received her booster vaccine on Tuesday and flu shot yesterday.

## 2020-01-20 ENCOUNTER — Telehealth: Payer: Self-pay | Admitting: *Deleted

## 2020-01-20 LAB — NOVEL CORONAVIRUS, NAA: SARS-CoV-2, NAA: NOT DETECTED

## 2020-01-20 LAB — SARS-COV-2, NAA 2 DAY TAT

## 2020-01-20 MED ORDER — DOXYCYCLINE HYCLATE 100 MG PO CAPS: 100.0000 mg | ORAL_CAPSULE | Freq: Two times a day (BID) | ORAL | 0 refills | Status: DC

## 2020-01-20 MED ORDER — MONTELUKAST SODIUM 10 MG PO TABS: 10.0000 mg | ORAL_TABLET | Freq: Every day | ORAL | 0 refills | Status: AC

## 2020-01-20 MED ORDER — PREDNISONE 50 MG PO TABS: 50.0000 mg | ORAL_TABLET | Freq: Every day | ORAL | 0 refills | Status: DC

## 2020-01-25 ENCOUNTER — Telehealth: Payer: Self-pay | Admitting: Cardiology

## 2020-01-26 NOTE — Telephone Encounter (Signed)
Called patient, NA, Vmbox if full, cannot leave a message.

## 2020-01-30 NOTE — Telephone Encounter (Signed)
Called and spoke with patient regarding her remote ICD check results.

## 2020-02-23 ENCOUNTER — Ambulatory Visit: Payer: Medicare HMO | Admitting: Cardiology

## 2020-03-04 ENCOUNTER — Other Ambulatory Visit: Payer: Self-pay | Admitting: Cardiology

## 2020-03-08 ENCOUNTER — Other Ambulatory Visit: Payer: Self-pay

## 2020-03-08 ENCOUNTER — Encounter: Payer: Self-pay | Admitting: Cardiology

## 2020-03-08 ENCOUNTER — Ambulatory Visit: Payer: Medicare HMO | Admitting: Cardiology

## 2020-03-08 VITALS — BP 142/78 | HR 89 | Ht 59.0 in | Wt 146.0 lb

## 2020-03-08 DIAGNOSIS — I25810 Atherosclerosis of coronary artery bypass graft(s) without angina pectoris: Secondary | ICD-10-CM

## 2020-03-08 DIAGNOSIS — I10 Essential (primary) hypertension: Secondary | ICD-10-CM | POA: Diagnosis not present

## 2020-03-08 DIAGNOSIS — I6523 Occlusion and stenosis of bilateral carotid arteries: Secondary | ICD-10-CM

## 2020-03-08 DIAGNOSIS — I5022 Chronic systolic (congestive) heart failure: Secondary | ICD-10-CM | POA: Diagnosis not present
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
## 2020-04-07 ENCOUNTER — Other Ambulatory Visit: Payer: Self-pay | Admitting: Cardiology

## 2020-04-07 DIAGNOSIS — I5022 Chronic systolic (congestive) heart failure: Secondary | ICD-10-CM

## 2020-04-10 ENCOUNTER — Ambulatory Visit (INDEPENDENT_AMBULATORY_CARE_PROVIDER_SITE_OTHER)
Admission: EM | Admit: 2020-04-10 | Discharge: 2020-04-10 | Disposition: A | Discharge status: Home / Self Care | Payer: Medicare HMO | Source: Home / Self Care | Attending: Internal Medicine | Admitting: Internal Medicine

## 2020-04-10 ENCOUNTER — Emergency Department (HOSPITAL_BASED_OUTPATIENT_CLINIC_OR_DEPARTMENT_OTHER)
Admission: EM | Admit: 2020-04-10 | Discharge: 2020-04-11 | Disposition: A | Discharge status: Home / Self Care | Payer: Medicare HMO | Attending: Emergency Medicine | Admitting: Emergency Medicine

## 2020-04-10 ENCOUNTER — Encounter (HOSPITAL_BASED_OUTPATIENT_CLINIC_OR_DEPARTMENT_OTHER): Payer: Self-pay | Admitting: *Deleted

## 2020-04-10 ENCOUNTER — Other Ambulatory Visit: Payer: Self-pay

## 2020-04-10 ENCOUNTER — Encounter: Payer: Self-pay | Admitting: Emergency Medicine

## 2020-04-10 DIAGNOSIS — I110 Hypertensive heart disease with heart failure: Secondary | ICD-10-CM | POA: Insufficient documentation

## 2020-04-10 DIAGNOSIS — I5022 Chronic systolic (congestive) heart failure: Secondary | ICD-10-CM | POA: Insufficient documentation

## 2020-04-10 DIAGNOSIS — Z87891 Personal history of nicotine dependence: Secondary | ICD-10-CM | POA: Insufficient documentation

## 2020-04-10 DIAGNOSIS — R109 Unspecified abdominal pain: Secondary | ICD-10-CM | POA: Diagnosis not present

## 2020-04-10 DIAGNOSIS — R1084 Generalized abdominal pain: Secondary | ICD-10-CM | POA: Insufficient documentation

## 2020-04-10 DIAGNOSIS — Z955 Presence of coronary angioplasty implant and graft: Secondary | ICD-10-CM | POA: Diagnosis not present

## 2020-04-10 DIAGNOSIS — E119 Type 2 diabetes mellitus without complications: Secondary | ICD-10-CM | POA: Diagnosis not present

## 2020-04-10 DIAGNOSIS — J45909 Unspecified asthma, uncomplicated: Secondary | ICD-10-CM | POA: Insufficient documentation

## 2020-04-10 DIAGNOSIS — Z96643 Presence of artificial hip joint, bilateral: Secondary | ICD-10-CM | POA: Diagnosis not present

## 2020-04-10 DIAGNOSIS — R000 Tachycardia, unspecified: Secondary | ICD-10-CM | POA: Diagnosis not present

## 2020-04-10 DIAGNOSIS — R1030 Lower abdominal pain, unspecified: Secondary | ICD-10-CM | POA: Diagnosis not present

## 2020-04-10 DIAGNOSIS — Z79899 Other long term (current) drug therapy: Secondary | ICD-10-CM | POA: Diagnosis not present

## 2020-04-10 DIAGNOSIS — I2510 Atherosclerotic heart disease of native coronary artery without angina pectoris: Secondary | ICD-10-CM | POA: Insufficient documentation

## 2020-04-10 LAB — COMPREHENSIVE METABOLIC PANEL
ALT: 10 U/L (ref 0–44)
AST: 14 U/L — ABNORMAL LOW (ref 15–41)
Albumin: 4 g/dL (ref 3.5–5.0)
Alkaline Phosphatase: 56 U/L (ref 38–126)
Anion gap: 12 (ref 5–15)
BUN: 11 mg/dL (ref 8–23)
CO2: 27 mmol/L (ref 22–32)
Calcium: 9.4 mg/dL (ref 8.9–10.3)
Chloride: 96 mmol/L — ABNORMAL LOW (ref 98–111)
Creatinine, Ser: 1.11 mg/dL — ABNORMAL HIGH (ref 0.44–1.00)
GFR, Estimated: 51 mL/min — ABNORMAL LOW (ref >60)
Glucose, Bld: 134 mg/dL — ABNORMAL HIGH (ref 70–99)
Potassium: 3.7 mmol/L (ref 3.5–5.1)
Sodium: 135 mmol/L (ref 135–145)
Total Bilirubin: 0.6 mg/dL (ref 0.3–1.2)
Total Protein: 7.8 g/dL (ref 6.5–8.1)

## 2020-04-10 LAB — CBC WITH DIFFERENTIAL/PLATELET
Abs Immature Granulocytes: 0.02 10*3/uL (ref 0.00–0.07)
Basophils Absolute: 0 10*3/uL (ref 0.0–0.1)
Basophils Relative: 1 %
Eosinophils Absolute: 0.2 10*3/uL (ref 0.0–0.5)
Eosinophils Relative: 3 %
HCT: 38.2 % (ref 36.0–46.0)
Hemoglobin: 12.3 g/dL (ref 12.0–15.0)
Immature Granulocytes: 0 %
Lymphocytes Relative: 30 %
Lymphs Abs: 1.7 10*3/uL (ref 0.7–4.0)
MCH: 29.1 pg (ref 26.0–34.0)
MCHC: 32.2 g/dL (ref 30.0–36.0)
MCV: 90.5 fL (ref 80.0–100.0)
Monocytes Absolute: 0.6 10*3/uL (ref 0.1–1.0)
Monocytes Relative: 10 %
Neutro Abs: 3.3 10*3/uL (ref 1.7–7.7)
Neutrophils Relative %: 56 %
Platelets: 405 10*3/uL — ABNORMAL HIGH (ref 150–400)
RBC: 4.22 MIL/uL (ref 3.87–5.11)
RDW: 13.2 % (ref 11.5–15.5)
WBC: 5.8 10*3/uL (ref 4.0–10.5)
nRBC: 0 % (ref 0.0–0.2)

## 2020-04-10 LAB — POCT URINALYSIS DIP (MANUAL ENTRY)
Bilirubin, UA: NEGATIVE
Glucose, UA: NEGATIVE mg/dL
Ketones, POC UA: NEGATIVE mg/dL
Nitrite, UA: NEGATIVE
Protein Ur, POC: 30 mg/dL — AB
Spec Grav, UA: 1.015 (ref 1.010–1.025)
Urobilinogen, UA: 1 E.U./dL
pH, UA: 7 (ref 5.0–8.0)

## 2020-04-10 NOTE — ED Triage Notes (Signed)
Pt sts lower abd pain starting early this am; pt sts some feeling of needing to have BM without success; pt sts "felt like if she belched she would feel better"; pt denies vomiting or dysuria
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
## 2020-04-10 NOTE — Discharge Instructions (Signed)
Abdominal pain is severe Please go to the emergency department for further evaluation and possibly imaging.

## 2020-04-10 NOTE — ED Triage Notes (Signed)
Pt sts lower abd pain starting early this am; pt sts some feeling of needing to have BM without success; sent her by UC for eval

## 2020-04-11 ENCOUNTER — Emergency Department (HOSPITAL_BASED_OUTPATIENT_CLINIC_OR_DEPARTMENT_OTHER): Payer: Medicare HMO

## 2020-04-11 ENCOUNTER — Encounter (HOSPITAL_BASED_OUTPATIENT_CLINIC_OR_DEPARTMENT_OTHER): Payer: Self-pay | Admitting: Radiology

## 2020-04-11 DIAGNOSIS — R1030 Lower abdominal pain, unspecified: Secondary | ICD-10-CM | POA: Diagnosis not present

## 2020-04-11 DIAGNOSIS — R109 Unspecified abdominal pain: Secondary | ICD-10-CM | POA: Diagnosis not present

## 2020-04-11 DIAGNOSIS — R000 Tachycardia, unspecified: Secondary | ICD-10-CM | POA: Diagnosis not present

## 2020-04-11 LAB — LIPASE, BLOOD: Lipase: 20 U/L (ref 11–51)

## 2020-04-11 LAB — URINALYSIS, ROUTINE W REFLEX MICROSCOPIC
Bilirubin Urine: NEGATIVE
Glucose, UA: NEGATIVE mg/dL
Hgb urine dipstick: NEGATIVE
Ketones, ur: NEGATIVE mg/dL
Leukocytes,Ua: NEGATIVE
Nitrite: NEGATIVE
Protein, ur: NEGATIVE mg/dL
Specific Gravity, Urine: 1.01 (ref 1.005–1.030)
pH: 7 (ref 5.0–8.0)

## 2020-04-11 MED ORDER — SACUBITRIL-VALSARTAN 97-103 MG PO TABS
1.0000 | ORAL_TABLET | Freq: Two times a day (BID) | ORAL | Status: DC
  Filled 2020-04-11: qty 1

## 2020-04-11 MED ORDER — MONTELUKAST SODIUM 10 MG PO TABS
10.0000 mg | ORAL_TABLET | Freq: Every day | ORAL | Status: DC
  Filled 2020-04-11: qty 1

## 2020-04-11 MED ORDER — SODIUM CHLORIDE 0.9 % IV BOLUS
1000.0000 mL | Freq: Once | INTRAVENOUS | Status: AC
  Administered 2020-04-11: 1000 mL via INTRAVENOUS

## 2020-04-11 MED ORDER — HYDROMORPHONE HCL 1 MG/ML IJ SOLN
INTRAMUSCULAR | Status: AC
  Filled 2020-04-11: qty 1

## 2020-04-11 MED ORDER — IOHEXOL 300 MG/ML  SOLN
100.0000 mL | Freq: Once | INTRAMUSCULAR | Status: AC | PRN
  Administered 2020-04-11: 80 mL via INTRAVENOUS

## 2020-04-11 MED ORDER — FENTANYL CITRATE (PF) 100 MCG/2ML IJ SOLN
50.0000 ug | Freq: Once | INTRAMUSCULAR | Status: AC
Start: 2020-04-11 — End: 2020-04-11
  Administered 2020-04-11: 50 ug via INTRAVENOUS
  Filled 2020-04-11: qty 2

## 2020-04-11 MED ORDER — FAMOTIDINE 20 MG PO TABS
20.0000 mg | ORAL_TABLET | Freq: Once | ORAL | Status: AC
  Administered 2020-04-11: 20 mg via ORAL
  Filled 2020-04-11: qty 1

## 2020-04-11 MED ORDER — HYDROMORPHONE HCL 1 MG/ML IJ SOLN
0.5000 mg | Freq: Once | INTRAMUSCULAR | Status: AC
  Administered 2020-04-11: 0.5 mg via INTRAVENOUS

## 2020-04-11 NOTE — ED Notes (Signed)
Bladder scan performed, 0 ml detected x3

## 2020-04-11 NOTE — Discharge Instructions (Addendum)

## 2020-04-12 ENCOUNTER — Ambulatory Visit (INDEPENDENT_AMBULATORY_CARE_PROVIDER_SITE_OTHER): Payer: Medicare HMO | Admitting: Nurse Practitioner

## 2020-04-12 ENCOUNTER — Encounter: Payer: Self-pay | Admitting: Nurse Practitioner

## 2020-04-12 ENCOUNTER — Other Ambulatory Visit: Payer: Self-pay

## 2020-04-12 VITALS — BP 148/76 | HR 106 | Temp 97.5°F | Ht 61.0 in

## 2020-04-12 DIAGNOSIS — K5900 Constipation, unspecified: Secondary | ICD-10-CM

## 2020-04-12 LAB — URINE CULTURE: Culture: NO GROWTH

## 2020-04-12 MED ORDER — LINACLOTIDE 72 MCG PO CAPS: 72.0000 ug | ORAL_CAPSULE | Freq: Every day | ORAL | 0 refills | Status: AC
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
## 2020-04-13 LAB — URINE CULTURE
Culture: <10000 — AB
Special Requests: NORMAL

## 2020-04-15 ENCOUNTER — Emergency Department (HOSPITAL_COMMUNITY): Payer: Medicare HMO

## 2020-04-15 ENCOUNTER — Encounter (HOSPITAL_COMMUNITY): Payer: Self-pay

## 2020-04-15 ENCOUNTER — Other Ambulatory Visit: Payer: Self-pay

## 2020-04-15 ENCOUNTER — Observation Stay (HOSPITAL_COMMUNITY)
Admission: EM | Admit: 2020-04-15 | Discharge: 2020-04-17 | Disposition: A | Discharge status: Home / Self Care | Payer: Medicare HMO | Attending: Internal Medicine | Admitting: Internal Medicine

## 2020-04-15 DIAGNOSIS — I701 Atherosclerosis of renal artery: Secondary | ICD-10-CM | POA: Diagnosis not present

## 2020-04-15 DIAGNOSIS — E876 Hypokalemia: Secondary | ICD-10-CM | POA: Diagnosis not present

## 2020-04-15 DIAGNOSIS — E1140 Type 2 diabetes mellitus with diabetic neuropathy, unspecified: Secondary | ICD-10-CM | POA: Insufficient documentation

## 2020-04-15 DIAGNOSIS — E860 Dehydration: Secondary | ICD-10-CM | POA: Diagnosis not present

## 2020-04-15 DIAGNOSIS — Z951 Presence of aortocoronary bypass graft: Secondary | ICD-10-CM | POA: Insufficient documentation

## 2020-04-15 DIAGNOSIS — E1122 Type 2 diabetes mellitus with diabetic chronic kidney disease: Secondary | ICD-10-CM

## 2020-04-15 DIAGNOSIS — K219 Gastro-esophageal reflux disease without esophagitis: Secondary | ICD-10-CM | POA: Diagnosis present

## 2020-04-15 DIAGNOSIS — R29810 Facial weakness: Secondary | ICD-10-CM | POA: Diagnosis not present

## 2020-04-15 DIAGNOSIS — Z96643 Presence of artificial hip joint, bilateral: Secondary | ICD-10-CM | POA: Diagnosis not present

## 2020-04-15 DIAGNOSIS — Z20822 Contact with and (suspected) exposure to covid-19: Secondary | ICD-10-CM | POA: Insufficient documentation

## 2020-04-15 DIAGNOSIS — J45909 Unspecified asthma, uncomplicated: Secondary | ICD-10-CM | POA: Diagnosis not present

## 2020-04-15 DIAGNOSIS — M545 Low back pain, unspecified: Secondary | ICD-10-CM | POA: Diagnosis present

## 2020-04-15 DIAGNOSIS — R29898 Other symptoms and signs involving the musculoskeletal system: Secondary | ICD-10-CM | POA: Diagnosis not present

## 2020-04-15 DIAGNOSIS — E782 Mixed hyperlipidemia: Secondary | ICD-10-CM | POA: Diagnosis present

## 2020-04-15 DIAGNOSIS — R202 Paresthesia of skin: Secondary | ICD-10-CM | POA: Diagnosis not present

## 2020-04-15 DIAGNOSIS — I10 Essential (primary) hypertension: Secondary | ICD-10-CM | POA: Diagnosis present

## 2020-04-15 DIAGNOSIS — Z7902 Long term (current) use of antithrombotics/antiplatelets: Secondary | ICD-10-CM | POA: Insufficient documentation

## 2020-04-15 DIAGNOSIS — M25559 Pain in unspecified hip: Secondary | ICD-10-CM

## 2020-04-15 DIAGNOSIS — R918 Other nonspecific abnormal finding of lung field: Secondary | ICD-10-CM | POA: Diagnosis not present

## 2020-04-15 DIAGNOSIS — R4182 Altered mental status, unspecified: Secondary | ICD-10-CM | POA: Diagnosis not present

## 2020-04-15 DIAGNOSIS — I25810 Atherosclerosis of coronary artery bypass graft(s) without angina pectoris: Secondary | ICD-10-CM | POA: Diagnosis present

## 2020-04-15 DIAGNOSIS — Z87891 Personal history of nicotine dependence: Secondary | ICD-10-CM | POA: Insufficient documentation

## 2020-04-15 DIAGNOSIS — I110 Hypertensive heart disease with heart failure: Secondary | ICD-10-CM | POA: Insufficient documentation

## 2020-04-15 DIAGNOSIS — R296 Repeated falls: Secondary | ICD-10-CM | POA: Insufficient documentation

## 2020-04-15 DIAGNOSIS — I5022 Chronic systolic (congestive) heart failure: Secondary | ICD-10-CM | POA: Diagnosis not present

## 2020-04-15 DIAGNOSIS — I2510 Atherosclerotic heart disease of native coronary artery without angina pectoris: Secondary | ICD-10-CM | POA: Diagnosis not present

## 2020-04-15 DIAGNOSIS — M6281 Muscle weakness (generalized): Principal | ICD-10-CM | POA: Insufficient documentation

## 2020-04-15 DIAGNOSIS — Z79899 Other long term (current) drug therapy: Secondary | ICD-10-CM | POA: Insufficient documentation

## 2020-04-15 DIAGNOSIS — N182 Chronic kidney disease, stage 2 (mild): Secondary | ICD-10-CM

## 2020-04-15 DIAGNOSIS — Z9861 Coronary angioplasty status: Secondary | ICD-10-CM | POA: Insufficient documentation

## 2020-04-15 DIAGNOSIS — G629 Polyneuropathy, unspecified: Secondary | ICD-10-CM

## 2020-04-15 DIAGNOSIS — E118 Type 2 diabetes mellitus with unspecified complications: Secondary | ICD-10-CM | POA: Diagnosis present

## 2020-04-15 DIAGNOSIS — Z95810 Presence of automatic (implantable) cardiac defibrillator: Secondary | ICD-10-CM | POA: Diagnosis present

## 2020-04-15 DIAGNOSIS — I517 Cardiomegaly: Secondary | ICD-10-CM | POA: Diagnosis not present

## 2020-04-15 DIAGNOSIS — N179 Acute kidney failure, unspecified: Secondary | ICD-10-CM | POA: Diagnosis present

## 2020-04-15 DIAGNOSIS — R079 Chest pain, unspecified: Secondary | ICD-10-CM | POA: Diagnosis not present

## 2020-04-15 DIAGNOSIS — E871 Hypo-osmolality and hyponatremia: Secondary | ICD-10-CM | POA: Diagnosis not present

## 2020-04-15 DIAGNOSIS — R531 Weakness: Secondary | ICD-10-CM

## 2020-04-15 DIAGNOSIS — R4781 Slurred speech: Secondary | ICD-10-CM | POA: Diagnosis not present

## 2020-04-15 DIAGNOSIS — E119 Type 2 diabetes mellitus without complications: Secondary | ICD-10-CM

## 2020-04-15 LAB — URINALYSIS, ROUTINE W REFLEX MICROSCOPIC
Bilirubin Urine: NEGATIVE
Glucose, UA: NEGATIVE mg/dL
Hgb urine dipstick: NEGATIVE
Ketones, ur: NEGATIVE mg/dL
Leukocytes,Ua: NEGATIVE
Nitrite: NEGATIVE
Protein, ur: NEGATIVE mg/dL
Specific Gravity, Urine: 1.01 (ref 1.005–1.030)
pH: 6 (ref 5.0–8.0)

## 2020-04-15 LAB — COMPREHENSIVE METABOLIC PANEL
ALT: 17 U/L (ref 0–44)
AST: 48 U/L — ABNORMAL HIGH (ref 15–41)
Albumin: 3.7 g/dL (ref 3.5–5.0)
Alkaline Phosphatase: 57 U/L (ref 38–126)
Anion gap: 12 (ref 5–15)
BUN: 15 mg/dL (ref 8–23)
CO2: 24 mmol/L (ref 22–32)
Calcium: 9.3 mg/dL (ref 8.9–10.3)
Chloride: 91 mmol/L — ABNORMAL LOW (ref 98–111)
Creatinine, Ser: 1.43 mg/dL — ABNORMAL HIGH (ref 0.44–1.00)
GFR, Estimated: 37 mL/min — ABNORMAL LOW (ref >60)
Glucose, Bld: 89 mg/dL (ref 70–99)
Potassium: 4.7 mmol/L (ref 3.5–5.1)
Sodium: 127 mmol/L — ABNORMAL LOW (ref 135–145)
Total Bilirubin: 1 mg/dL (ref 0.3–1.2)
Total Protein: 6.9 g/dL (ref 6.5–8.1)

## 2020-04-15 LAB — RESP PANEL BY RT-PCR (FLU A&B, COVID) ARPGX2
Influenza A by PCR: NEGATIVE
Influenza B by PCR: NEGATIVE
SARS Coronavirus 2 by RT PCR: NEGATIVE

## 2020-04-15 LAB — SODIUM, URINE, RANDOM: Sodium, Ur: 20 mmol/L

## 2020-04-15 LAB — CREATININE, URINE, RANDOM: Creatinine, Urine: 61.47 mg/dL

## 2020-04-15 LAB — CBC WITH DIFFERENTIAL/PLATELET
Abs Immature Granulocytes: 0.04 10*3/uL (ref 0.00–0.07)
Basophils Absolute: 0 10*3/uL (ref 0.0–0.1)
Basophils Relative: 1 %
Eosinophils Absolute: 0.1 10*3/uL (ref 0.0–0.5)
Eosinophils Relative: 2 %
HCT: 41.1 % (ref 36.0–46.0)
Hemoglobin: 13 g/dL (ref 12.0–15.0)
Immature Granulocytes: 1 %
Lymphocytes Relative: 24 %
Lymphs Abs: 1.3 10*3/uL (ref 0.7–4.0)
MCH: 28 pg (ref 26.0–34.0)
MCHC: 31.6 g/dL (ref 30.0–36.0)
MCV: 88.6 fL (ref 80.0–100.0)
Monocytes Absolute: 0.6 10*3/uL (ref 0.1–1.0)
Monocytes Relative: 12 %
Neutro Abs: 3.2 10*3/uL (ref 1.7–7.7)
Neutrophils Relative %: 60 %
Platelets: 392 10*3/uL (ref 150–400)
RBC: 4.64 MIL/uL (ref 3.87–5.11)
RDW: 13.2 % (ref 11.5–15.5)
WBC: 5.4 10*3/uL (ref 4.0–10.5)
nRBC: 0 % (ref 0.0–0.2)

## 2020-04-15 LAB — TROPONIN I (HIGH SENSITIVITY)
Troponin I (High Sensitivity): 12 ng/L (ref <18)
Troponin I (High Sensitivity): 12 ng/L (ref <18)

## 2020-04-15 LAB — CBG MONITORING, ED
Glucose-Capillary: 98 mg/dL (ref 70–99)
Glucose-Capillary: 96 mg/dL (ref 70–99)

## 2020-04-15 LAB — OSMOLALITY, URINE: Osmolality, Ur: 179 mOsm/kg — ABNORMAL LOW (ref 300–900)

## 2020-04-15 LAB — LACTIC ACID, PLASMA: Lactic Acid, Venous: 1.8 mmol/L (ref 0.5–1.9)

## 2020-04-15 MED ORDER — EZETIMIBE 10 MG PO TABS
10.0000 mg | ORAL_TABLET | Freq: Every day | ORAL | Status: DC
  Administered 2020-04-16 – 2020-04-17 (×2): 10 mg via ORAL
  Filled 2020-04-15 (×2): qty 1

## 2020-04-15 MED ORDER — ROSUVASTATIN CALCIUM 20 MG PO TABS
20.0000 mg | ORAL_TABLET | Freq: Every day | ORAL | Status: DC
  Administered 2020-04-16 – 2020-04-17 (×2): 20 mg via ORAL
  Filled 2020-04-15: qty 4
  Filled 2020-04-15: qty 1

## 2020-04-15 MED ORDER — MONTELUKAST SODIUM 10 MG PO TABS
10.0000 mg | ORAL_TABLET | Freq: Every day | ORAL | Status: DC
  Administered 2020-04-16 (×2): 10 mg via ORAL
  Filled 2020-04-15 (×2): qty 1

## 2020-04-15 MED ORDER — HYDROCODONE-ACETAMINOPHEN 5-325 MG PO TABS
1.0000 | ORAL_TABLET | Freq: Four times a day (QID) | ORAL | Status: DC | PRN
Start: 2020-04-15 — End: 2020-04-17
  Administered 2020-04-15: 1 via ORAL
  Administered 2020-04-16 – 2020-04-17 (×3): 2 via ORAL
  Filled 2020-04-15 (×2): qty 2
  Filled 2020-04-15: qty 1
  Filled 2020-04-15: qty 2

## 2020-04-15 MED ORDER — RANOLAZINE ER 500 MG PO TB12
500.0000 mg | ORAL_TABLET | Freq: Two times a day (BID) | ORAL | Status: DC
  Administered 2020-04-16 – 2020-04-17 (×3): 500 mg via ORAL
  Filled 2020-04-15 (×4): qty 1

## 2020-04-15 MED ORDER — IOHEXOL 350 MG/ML SOLN
50.0000 mL | Freq: Once | INTRAVENOUS | Status: AC | PRN
  Administered 2020-04-15: 50 mL via INTRAVENOUS

## 2020-04-15 MED ORDER — TEMAZEPAM 7.5 MG PO CAPS
15.0000 mg | ORAL_CAPSULE | Freq: Every evening | ORAL | Status: DC | PRN
  Administered 2020-04-16: 15 mg via ORAL
  Filled 2020-04-15: qty 1

## 2020-04-15 MED ORDER — ALBUTEROL SULFATE HFA 108 (90 BASE) MCG/ACT IN AERS
2.0000 | INHALATION_SPRAY | RESPIRATORY_TRACT | Status: DC | PRN
  Filled 2020-04-15: qty 6.7

## 2020-04-15 MED ORDER — LINACLOTIDE 72 MCG PO CAPS
72.0000 ug | ORAL_CAPSULE | Freq: Every day | ORAL | Status: DC
  Administered 2020-04-16 – 2020-04-17 (×2): 72 ug via ORAL
  Filled 2020-04-15 (×3): qty 1

## 2020-04-15 MED ORDER — SODIUM CHLORIDE 0.9 % IV SOLN: INTRAVENOUS | Status: DC

## 2020-04-15 MED ORDER — ACETAMINOPHEN 500 MG PO TABS
500.0000 mg | ORAL_TABLET | Freq: Once | ORAL | Status: AC
  Administered 2020-04-15: 500 mg via ORAL
  Filled 2020-04-15: qty 1

## 2020-04-15 MED ORDER — SODIUM CHLORIDE 0.9 % IV BOLUS
500.0000 mL | Freq: Once | INTRAVENOUS | Status: AC
  Administered 2020-04-15: 500 mL via INTRAVENOUS

## 2020-04-15 MED ORDER — FENTANYL CITRATE (PF) 100 MCG/2ML IJ SOLN
12.5000 ug | INTRAMUSCULAR | Status: DC | PRN
  Administered 2020-04-16: 50 ug via INTRAVENOUS
  Filled 2020-04-15: qty 2

## 2020-04-15 MED ORDER — ACETAMINOPHEN 325 MG PO TABS: 650.0000 mg | ORAL_TABLET | Freq: Four times a day (QID) | ORAL | Status: DC | PRN

## 2020-04-15 MED ORDER — INSULIN ASPART 100 UNIT/ML ~~LOC~~ SOLN
0.0000 [IU] | SUBCUTANEOUS | Status: DC
  Administered 2020-04-16: 1 [IU] via SUBCUTANEOUS
  Administered 2020-04-16: 2 [IU] via SUBCUTANEOUS
  Administered 2020-04-16 (×2): 1 [IU] via SUBCUTANEOUS

## 2020-04-15 MED ORDER — DORZOLAMIDE HCL-TIMOLOL MAL 2-0.5 % OP SOLN
1.0000 [drp] | Freq: Two times a day (BID) | OPHTHALMIC | Status: DC
  Administered 2020-04-16 (×2): 1 [drp] via OPHTHALMIC
  Filled 2020-04-15 (×2): qty 10

## 2020-04-15 MED ORDER — FAMOTIDINE 20 MG PO TABS: 20.0000 mg | ORAL_TABLET | Freq: Every evening | ORAL | Status: DC | PRN

## 2020-04-15 MED ORDER — ACETAMINOPHEN 650 MG RE SUPP: 650.0000 mg | Freq: Four times a day (QID) | RECTAL | Status: DC | PRN

## 2020-04-15 MED ORDER — GABAPENTIN 300 MG PO CAPS
300.0000 mg | ORAL_CAPSULE | Freq: Four times a day (QID) | ORAL | Status: DC
  Administered 2020-04-16 – 2020-04-17 (×7): 300 mg via ORAL
  Filled 2020-04-15 (×7): qty 1

## 2020-04-15 MED ORDER — PANTOPRAZOLE SODIUM 40 MG PO TBEC
40.0000 mg | DELAYED_RELEASE_TABLET | Freq: Every day | ORAL | Status: DC
  Administered 2020-04-16 – 2020-04-17 (×2): 40 mg via ORAL
  Filled 2020-04-15 (×2): qty 1

## 2020-04-15 NOTE — ED Provider Notes (Signed)
[facility] EMERGENCY DEPARTMENT Provider Note   CSN: [id number] Arrival date & time: 04/15/20  1212     History Chief Complaint  Patient presents with  . Weakness  . Fall    Lynn Carey is a 80 y.o. female.  HPI      80yo female with history of CHF, CAD, DM, hypertension, ICD Medtronic, recent ED evaluation for abdominal pain, presents with concern for bilateral lower extremity weakness and new inability to walk.    Went to urgent care last Wednesday with abdominal pain, has high tolerance for pain, went to ED [facility] and had CT which showed constipation Thursday AM, has been having pain every since. Saturday evening was going to the bathroom well. No energy and can't stand up starting Saturday evening. Sunday she fell three times, this AM fell twice. Sister thought slurred speech last last night but other daughter thought it started somewhat Saturday.  Tingling in both hands and feet since Wednesday, hands started Wed, Friday started in feet.  Abdominal pain has improved but back hurts now, passing flatus this AM.  Back pain 5oday 5/10.    When stand up legs just give way Was weak on Wednesday, needed help getting up, had been holding her up.  Was able to walk without assistance prior to Wednesday, had difficulty walking since then, initially was walking with assistance, now can't even get up. Was able to walk to bathroom holding onto everything then fell down, unable to get up Both of them feel weak, thinks right knee worse NO dizziness or lightheadedness, no syncope No arm weakness or fatigue No headache, no nausea or vomiting.  Vomited once Friday. No injury with falls, has not hit head, no LOC.  Didn't notice slurred speech.  No change in vision, other focal numbness/weakness, drooping of the face.    Chest pain Saturday night, resolved now, then pain imprved on it's own.  No radiation, just a knot, a tightness.   No shortness of breath .    No new  medications other than linzess Vaccinated and boosted against COVID, no known sick contacts  No loss control bowel/bladder  Past Medical History:  Diagnosis Date  . Anginal pain ([place])    h/o, denies today   . Arthritis    hip - both, shot in L shoulder- 2 weeks ago  . Asthma   . Carotid artery occlusion   . CHF (congestive heart failure) ([place])   . Chronic systolic heart failure ([place]) 09/19/2018  . Complication of anesthesia   . Coronary artery disease   . Diabetes mellitus without complication ([place])   . Encounter for assessment of implantable cardioverter-defibrillator (ICD) 08/30/2019  . GERD (gastroesophageal reflux disease)   . Hypertension    followed by Dr. Einar Gip  . ICD Single chamber Medtronic VISIA MRI VR [id number] 04/21/2019 04/21/2019  . MYOCARDIAL INFARCTION 05/25/2007   Qualifier: History of  By: Milana Obey, Doroteo Bradford    . Myocardial infarction ([place]) 1997  . PONV (postoperative nausea and vomiting)   . Shortness of breath     Patient Active Problem List   Diagnosis Date Noted  . Bilateral leg weakness 04/15/2020  . Encounter for assessment of implantable cardioverter-defibrillator (ICD) 08/30/2019  . ICD Single chamber Medtronic VISIA MRI VR [id number] 04/21/2019 04/21/2019  . Chronic systolic heart failure ([place]) 09/19/2018  . Ischemic cardiomyopathy 07/14/2018  . Coronary artery disease involving coronary bypass graft of native heart without angina pectoris 07/14/2018  . Bilateral carotid artery  stenosis 07/14/2018  . Neuropathy 02/14/2014  . Degenerative joint disease (DJD) of hip 01/30/2014  . Chronic cough 05/26/2013  . Coronary atherosclerosis of native coronary artery 01/23/2013  . Constipation 12/20/2012  . DM (diabetes mellitus) ([place]) 12/20/2012  . GERD (gastroesophageal reflux disease) 12/20/2012  . Osteoarthritis of hip 12/13/2012    Class: Chronic  . Mixed hyperlipidemia 05/25/2007  . Essential hypertension 05/25/2007  . Allergic rhinitis 05/25/2007  .  Asthma 05/25/2007    Past Surgical History:  Procedure Laterality Date  . CARDIAC CATHETERIZATION  05/2012   see note  . CORONARY ARTERY BYPASS GRAFT  [address]  . CORONARY STENT INTERVENTION Right 06/18/2017   Procedure: CORONARY STENT INTERVENTION;  Surgeon: Adrian Prows, MD;  Location: [facility];  Service: Cardiovascular;  Laterality: Right;  . EYE SURGERY     both eyes, laser procedure  . ICD IMPLANT N/A 04/21/2019   Procedure: ICD IMPLANT;  Surgeon: Deboraha Sprang, MD;  Location: [facility];  Service: Cardiovascular;  Laterality: N/A;  . JOINT REPLACEMENT Left 12/13/12   hip  . LEFT HEART CATH AND CORONARY ANGIOGRAPHY N/A 11/25/2017   Procedure: LEFT HEART CATH AND CORONARY ANGIOGRAPHY;  Surgeon: [name], MD;  Location: [facility];  Service: Cardiovascular;  Laterality: N/A;  . LEFT HEART CATH AND CORS/GRAFTS ANGIOGRAPHY N/A 06/18/2017   Procedure: LEFT HEART CATH AND CORS/GRAFTS ANGIOGRAPHY;  Surgeon: Adrian Prows, MD;  Location: [facility];  Service: Cardiovascular;  Laterality: N/A;  . LEFT HEART CATHETERIZATION WITH CORONARY/GRAFT ANGIOGRAM N/A 04/21/2011   Procedure: LEFT HEART CATHETERIZATION WITH Beatrix Fetters;  Surgeon: Laverda Page, MD;  Location[facility] LAB;  Service: Cardiovascular;  Laterality: N/A;  . PERCUTANEOUS CORONARY INTERVENTION-BALLOON ONLY Right 05/12/2011   Procedure: PERCUTANEOUS CORONARY INTERVENTION-BALLOON ONLY;  Surgeon: Laverda Page, MD;  Location: [facility] CATH LAB;  Service: Cardiovascular;  Laterality: Right;  . TOTAL HIP ARTHROPLASTY Left 12/13/2012   Procedure: TOTAL HIP ARTHROPLASTY ANTERIOR APPROACH;  Surgeon: Hessie Dibble, MD;  Location: [facility];  Service: Orthopedics;  Laterality: Left;  . TOTAL HIP ARTHROPLASTY Right 01/30/2014   Procedure: TOTAL HIP ARTHROPLASTY ANTERIOR APPROACH;  Surgeon: Hessie Dibble, MD;  Location: [facility];  Service: Orthopedics;  Laterality: Right;  . TUBAL  LIGATION       OB History   No obstetric history on file.     Family History  Problem Relation Age of Onset  . Allergies Mother   . Asthma Mother   . Heart disease Mother   . Heart attack Mother   . Allergies Son   . Asthma Son   . Heart disease Father   . Heart attack Father   . Breast cancer Daughter 28    Social History   Tobacco Use  . Smoking status: Former Smoker    Packs/day: 0.50    Years: 2.00    Pack years: 1.00    Types: Cigarettes    Quit date: 12/08/1983    Years since quitting: 36.3  . Smokeless tobacco: Never Used  Vaping Use  . Vaping Use: Never used  Substance Use Topics  . Alcohol use: No  . Drug use: No    Home Medications Prior to Admission medications   Medication Sig Start Date End Date Taking? Authorizing Provider  acetaminophen (TYLENOL) 650 MG CR tablet Take 650 mg by mouth every 8 (eight) hours as needed for pain.   Yes [provider]  albuterol (VENTOLIN HFA) 108 (90 Base) MCG/ACT inhaler Inhale 2 puffs into the lungs every 4 (four) hours as needed for wheezing or shortness of breath. 11/07/18  Yes Glendale Chard, MD  amLODipine (NORVASC) 2.5 MG tablet TAKE 1 TABLET(2.5 MG) BY MOUTH DAILY Patient taking differently: Take 2.5 mg by mouth daily. 11/02/19  Yes Patwardhan, Manish J, MD  BIDIL 20-37.5 MG tablet TAKE 2 TABLETS BY MOUTH THREE TIMES DAILY Patient taking differently: Take 2 tablets by mouth 3 (three) times daily. 04/08/20  Yes Patwardhan, Manish J, MD  Cholecalciferol (VITAMIN D) 2000 UNITS CAPS Take 2,000 Units by mouth daily.    Yes [provider]  clopidogrel (PLAVIX) 75 MG tablet TAKE 1 TABLET EVERY DAY Patient taking differently: Take 75 mg by mouth. 11/03/19  Yes Patwardhan, Manish J, MD  dorzolamide-timolol (COSOPT) 22.3-6.8 MG/ML ophthalmic solution Place 1 drop into both eyes 2 (two) times daily. 11/27/19  Yes [provider]  ezetimibe (ZETIA) 10 MG tablet TAKE 1 TABLET EVERY DAY Patient taking  differently: Take 10 mg by mouth daily. 08/07/19  Yes Glendale Chard, MD  famotidine (PEPCID) 20 MG tablet Take 20 mg by mouth at bedtime as needed for heartburn or indigestion. 09/13/19  Yes [provider]  furosemide (LASIX) 40 MG tablet TAKE 1 TABLET(40 MG) BY MOUTH DAILY Patient taking differently: Take 40 mg by mouth daily. 11/21/19  Yes Patwardhan, Reynold Bowen, MD  linaclotide (LINZESS) 72 MCG capsule Take 1 capsule (72 mcg total) by mouth daily before breakfast. 04/12/20  Yes Ghumman, Ramandeep, NP  metoprolol succinate (TOPROL-XL) 50 MG 24 hr tablet Take 1 tablet (50 mg total) by mouth daily. Take with or immediately following a meal. 10/05/18  Yes Patwardhan, Manish J, MD  montelukast (SINGULAIR) 10 MG tablet Take 1 tablet (10 mg total) by mouth at bedtime. 01/20/20  Yes Yu, Amy V, PA-C  nitroGLYCERIN (NITROSTAT) 0.4 MG SL tablet Place 0.4 mg under the tongue every 5 (five) minutes as needed for chest pain.   Yes [provider]  pantoprazole (PROTONIX) 40 MG tablet TAKE 1 TABLET EVERY DAY Patient taking differently: Take 40 mg by mouth daily. 08/07/19  Yes Glendale Chard, MD  ranolazine (RANEXA) 500 MG 12 hr tablet Take 2 tablets (1,000 mg total) by mouth 2 (two) times daily. Patient taking differently: Take 500 mg by mouth 2 (two) times daily. 11/15/18  Yes Patwardhan, Manish J, MD  rosuvastatin (CRESTOR) 40 MG tablet TAKE 1/2 TABLET EVERY DAY Patient taking differently: Take 20 mg by mouth daily. 10/31/19  Yes Adrian Prows, MD  sacubitril-valsartan (ENTRESTO) 97-103 MG Take 1 tablet by mouth 2 (two) times daily. 06/14/19  Yes Patwardhan, Manish J, MD  sitaGLIPtin (JANUVIA) 50 MG tablet Take 1 tablet (50 mg total) by mouth daily. Patient taking differently: Take 50 mg by mouth every Monday, Wednesday, and Friday. 07/28/19  Yes Glendale Chard, MD  spironolactone (ALDACTONE) 50 MG tablet TAKE 1 TABLET BY MOUTH EVERY DAY Patient taking differently: Take 50 mg by mouth daily. 03/04/20  Yes  Adrian Prows, MD  temazepam (RESTORIL) 15 MG capsule Take 15 mg by mouth at bedtime as needed for sleep. 05/20/17  Yes [provider]  doxycycline (VIBRAMYCIN) 100 MG capsule Take 1 capsule (100 mg total) by mouth 2 (two) times daily. Patient not taking: No sig reported 01/20/20   Tasia Catchings, Amy V, PA-C  gabapentin (NEURONTIN) 300 MG capsule Take 1 capsule (300 mg total) by mouth 4 (four) times daily. 06/20/19   Wert,  Christena Deem, MD    Allergies    Lisinopril  Review of Systems   Review of Systems  Constitutional: Positive for fatigue. Negative for fever.  HENT: Negative for sore throat.   Eyes: Negative for visual disturbance.  Respiratory: Negative for cough and shortness of breath.   Cardiovascular: Positive for chest pain (improved).  Gastrointestinal: Positive for abdominal pain (improved). Negative for nausea and vomiting.  Genitourinary: Negative for difficulty urinating.  Musculoskeletal: Positive for back pain. Negative for neck pain.  Skin: Negative for rash.  Neurological: Positive for weakness and numbness. Negative for syncope, facial asymmetry, light-headedness and headaches.    Physical Exam Updated Vital Signs BP (!) 134/94   Pulse 84   Temp 98.4 F (36.9 C) (Oral)   Resp 12   SpO2 97%   Physical Exam Constitutional:      General: She is not in acute distress.    Appearance: Normal appearance. She is not ill-appearing.  HENT:     Head: Normocephalic and atraumatic.  Eyes:     General: No visual field deficit.    Extraocular Movements: Extraocular movements intact.     Conjunctiva/sclera: Conjunctivae normal.     Pupils: Pupils are equal, round, and reactive to light.  Cardiovascular:     Rate and Rhythm: Normal rate and regular rhythm.     Pulses: Normal pulses.  Pulmonary:     Effort: Pulmonary effort is normal. No respiratory distress.  Abdominal:     General: Abdomen is flat. There is no distension.     Tenderness: There is no abdominal tenderness.   Musculoskeletal:        General: No swelling or tenderness.     Cervical back: Normal range of motion.  Skin:    General: Skin is warm and dry.     Findings: No erythema or rash.  Neurological:     General: No focal deficit present.     Mental Status: She is alert and oriented to person, place, and time.     GCS: GCS eye subscore is 4. GCS verbal subscore is 5. GCS motor subscore is 6.     Cranial Nerves: No cranial nerve deficit, dysarthria or facial asymmetry.     Sensory: No sensory deficit (reports altered sensation bialteral feet and right hand).     Motor: Weakness (appears ot have weakness wiht knee flexion bilaterally, question hip flexion) present. No tremor.     Coordination: Coordination normal. Finger-Nose-Finger Test normal.     Gait: Gait normal.     Deep Tendon Reflexes: Abnormal reflex: limited exam.     ED Results / Procedures / Treatments   Labs (all labs ordered are listed, but only abnormal results are displayed) Labs Reviewed  COMPREHENSIVE METABOLIC PANEL - Abnormal; Notable for the following components:      Result Value   Sodium 127 (*)    Chloride 91 (*)    Creatinine, Ser 1.43 (*)    AST 48 (*)    GFR, Estimated 37 (*)    All other components within normal limits  OSMOLALITY, URINE - Abnormal; Notable for the following components:   Osmolality, Ur 179 (*)    All other components within normal limits  RESP PANEL BY RT-PCR (FLU A&B, COVID) [id number]  URINE CULTURE  URINALYSIS, ROUTINE W REFLEX MICROSCOPIC  CBC WITH DIFFERENTIAL/PLATELET  LACTIC ACID, PLASMA  CREATININE, URINE, RANDOM  SODIUM, URINE, RANDOM  LACTIC ACID, PLASMA  CK  SEDIMENTATION RATE  C-REACTIVE PROTEIN  OSMOLALITY  HEMOGLOBIN A1C  CBG MONITORING, ED  CBG MONITORING, ED  TROPONIN I (HIGH SENSITIVITY)  TROPONIN I (HIGH SENSITIVITY)    EKG EKG Interpretation  Date/Time:  Monday April 15 2020 12:37:55 EST Ventricular Rate:  86 PR Interval:    QRS Duration: 132 QT  Interval:  421 QTC Calculation: 504 R Axis:   -65 Text Interpretation: Sinus rhythm Multiple ventricular premature complexes RBBB and LAFB Abnormal T, consider ischemia, lateral leads No significant change since last tracing Confirmed by Gareth Morgan [phone]) on 04/15/2020 4:01:25 PM   Radiology CT HEAD WO CONTRAST  Result Date: 04/15/2020 CLINICAL DATA:  Mental status change. Weakness and multiple falls. Slurred speech last night. EXAM: CT HEAD WITHOUT CONTRAST TECHNIQUE: Contiguous axial images were obtained from the base of the skull through the vertex without intravenous contrast. COMPARISON:  10/25/2019 FINDINGS: Brain: Stable degree of atrophy and chronic small vessel ischemia. Remote right parietal infarct. No intracranial hemorrhage, mass effect, or midline shift. Partially empty sella. No hydrocephalus. The basilar cisterns are patent. Basal gangliar mineralization is unchanged, typically senescent. No evidence of territorial infarct or acute ischemia. No extra-axial or intracranial fluid collection. Vascular: Atherosclerosis of skullbase vasculature without hyperdense vessel or abnormal calcification. Skull: No fracture. Slight thickening of the outer table of the right frontal bone is likely an incidental osteoma. Sinuses/Orbits: Paranasal sinuses and mastoid air cells are clear. The visualized orbits are unremarkable. Other: No large scalp hematoma. IMPRESSION: 1. No acute intracranial abnormality. 2. Stable atrophy, chronic small vessel ischemia, and remote right parietal infarct. Electronically Signed   By: Keith Rake M.D.   On: 04/15/2020 17:54   DG Chest Portable 1 View  Result Date: 04/15/2020 CLINICAL DATA:  Weakness. EXAM: PORTABLE CHEST 1 VIEW COMPARISON:  January 19, 2020. FINDINGS: Stable cardiomegaly. Sternotomy wires are noted. Left-sided pacemaker is unchanged in position. No pneumothorax or pleural effusion is noted. Left midlung subsegmental atelectasis or scarring is  noted. Right lung is clear. Bony thorax is unremarkable. IMPRESSION: Left midlung subsegmental atelectasis or scarring. Electronically Signed   By: Marijo Conception M.D.   On: 04/15/2020 14:27   CT Angio Chest/Abd/Pel for Dissection W and/or Wo Contrast  Result Date: 04/15/2020 CLINICAL DATA:  Abdominal pain aortic dissection suspected. EXAM: CT ANGIOGRAPHY CHEST, ABDOMEN AND PELVIS TECHNIQUE: Non-contrast CT of the chest was initially obtained. Multidetector CT imaging through the chest, abdomen and pelvis was performed using the standard protocol during bolus administration of intravenous contrast. Multiplanar reconstructed images and MIPs were obtained and reviewed to evaluate the vascular anatomy. CONTRAST:  14mL OMNIPAQUE IOHEXOL 350 MG/ML SOLN COMPARISON:  CT abdomen pelvis April 12, 2019. FINDINGS: CTA CHEST FINDINGS Cardiovascular: Preferential opacification of the thoracic aorta. No evidence of thoracic aortic aneurysm or dissection. Changes of prior CABG. Dense calcifications of the native coronary arteries. No pericardial effusion. Mediastinum/Nodes: No enlarged mediastinal, hilar, or axillary lymph nodes. Thyroid gland, trachea, and esophagus demonstrate no significant findings. Lungs/Pleura: Was ache attenuation of the lungs. Mild diffuse bronchial wall thickening. There are few scattered 3 mm or smaller pulmonary nodules for instance a 3 mm subpleural nodule in the right lower lobe (series 7, image 65) and a 3 mm subpleural left lower lobe nodule (series 7, image 106). Musculoskeletal: Prior median sternotomy. No chest wall abnormality. No acute or significant osseous findings. Degenerative change of the thoracic spine and bilateral glenohumeral joints. Review of the MIP images confirms the above findings. CTA ABDOMEN AND PELVIS FINDINGS VASCULAR Aorta: Aortic atherosclerosis. Normal caliber aorta without  aneurysm, dissection, vasculitis or significant stenosis. Celiac: Patent without evidence of  aneurysm, dissection, vasculitis or significant stenosis. SMA: Patent without evidence of aneurysm, dissection, vasculitis or significant stenosis. Renals: Calcified noncalcified plaque at the renal ostia with mild stenosis on the right and moderate stenosis on the left. IMA: Patent without evidence of aneurysm, dissection, vasculitis or significant stenosis. Inflow: Patent without evidence of aneurysm, dissection, vasculitis or significant stenosis. Veins: No obvious venous abnormality within the limitations of this arterial phase study. Review of the MIP images confirms the above findings. NON-VASCULAR Hepatobiliary: Cyst in the left lobe of the liver. No suspicious arterially hepatic lesions. No gallstones, gallbladder wall thickening, or biliary dilatation. Pancreas: Unremarkable. No pancreatic ductal dilatation or surrounding inflammatory changes. Spleen: Normal in size without focal abnormality. Adrenals/Urinary Tract: Adrenal glands are unremarkable. Right lower pole cortical scarring similar to prior. No hydronephrosis. Bladder is obscured by streak artifact from bilateral hip prostheses. Stomach/Bowel: Stomach is within normal limits. Appendix is absent. No evidence of bowel wall thickening, distention, or inflammatory changes, on this study which is partially obscured in the pelvis by streak artifact from bilateral hip prostheses. Lymphatic: No pathologically enlarged abdominal or pelvic lymph nodes. Reproductive: Uterus and bilateral adnexa are unremarkable. Other: No abdominal wall hernia or abnormality. Left inguinal surgical clips. No abdominopelvic ascites. Musculoskeletal: Bilateral total hip arthroplasties. Degenerative change of the thoracolumbar spine. No acute osseous abnormality. Review of the MIP images confirms the above findings. IMPRESSION: 1. No evidence of aortic aneurysm or dissection. 2. Mild diffuse bronchial wall thickening with mosaic attenuation of the lungs, suggestive of  bronchitis. 3. There are few scattered 3 mm or smaller bilateral pulmonary nodules. Which per Fleischner society guidelines no follow-up is recommended in low risk patients. However, an optional follow-up dedicated chest CT in 12 months could be obtained in high risk patients. 4. Moderate stenosis of the left renal artery and mild stenosis of the right renal artery at the ostia. 5. Aortic atherosclerosis. Aortic Atherosclerosis (ICD10-I70.0). Electronically Signed   By: Dahlia Bailiff MD   On: 04/15/2020 18:10    Procedures Procedures (including critical care time)  Medications Ordered in ED Medications  sodium chloride 0.9 % bolus 500 mL (has no administration in time range)  insulin aspart (novoLOG) injection 0-9 Units (0 Units Subcutaneous Not Given 04/15/20 2143)  iohexol (OMNIPAQUE) 350 MG/ML injection 50 mL (50 mLs Intravenous Contrast Given 04/15/20 1727)  acetaminophen (TYLENOL) tablet 500 mg (500 mg Oral Given 04/15/20 2105)    ED Course  I have reviewed the triage vital signs and the nursing notes.  Pertinent labs & imaging results that were available during my care of the patient were reviewed by me and considered in my medical decision making (see chart for details).    MDM Rules/Calculators/A&P                          80yo female with history of CHF, CAD, carotid stenosis, DM, hypertension, ICD Medtronic, recent ED evaluation for abdominal pain, presents with concern for bilateral lower extremity weakness and new inability to walk in addition to tingling bilateral feet and hands, episode of chest pain, episode of slurred speech, found to have BP in 80s initially that improved 120s without intervention.  DDx includes CVA, lumbar pathology, electrolyte abnormality, infection, GBS.   Labs significant for hyponatremia new from previous and consider hyponatremia as etiology.  No sign of anemia.  Troponin negative, no sign fo ACS.  No  sign of sepsis.  COVID testing negative.  Given  hypotension on arrival hx of chest, back, and abdominal pain, CT dissection study ordered and pending.   Discussed symptoms with Neurology, ordering MRI WO brain and MRI [facility] lumbar spine for further evaluation.   Care signed out to Dr. Alvino Chapel with CT, MRI pending and anticpate need for inpt admission.    Final Clinical Impression(s) / ED Diagnoses Final diagnoses:  Weakness  Hyponatremia    Rx / DC Orders ED Discharge Orders    None       Gareth Morgan, MD 04/15/20 2222

## 2020-04-15 NOTE — ED Notes (Signed)
This RN went to go check on patient. Patient crying stating that her back is hurting. Patient states pain medication did not help. Provider made aware.

## 2020-04-15 NOTE — ED Provider Notes (Signed)
  Physical Exam  BP (!) 142/76   Pulse 86   Temp 98.4 F (36.9 C) (Oral)   Resp 18   SpO2 98%   Physical Exam  ED Course/Procedures     Procedures  MDM  Received patient in signout.  Weakness in the legs.  Also reportedly tingling in hands and feet.  States that had some slurred speech last night.  Has mild hyponatremia.  Initial hypotension improved.  Head CT done and reassuring.  CT angiography of the chest abdomen pelvis done and reassuring.  However not able ambulate which is unusual for the patient.  Mild low back pain.  Takes Motrin at home.  MRI of brain and lumbar spine has been ordered.  However they have not been completed yet.  However will admit patient to hospitalist.        Davonna Belling, MD 04/15/20 2019

## 2020-04-15 NOTE — H&P (Signed)
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 arthroplasties. Degenerative change of the thoracolumbar spine. No acute osseous abnormality. Review of the MIP images confirms the above findings. IMPRESSION: 1. No evidence of aortic aneurysm or dissection. 2. Mild diffuse bronchial wall thickening with mosaic attenuation of the lungs, suggestive of bronchitis. 3. There are few scattered 3 mm or smaller bilateral pulmonary nodules. Which per Fleischner society guidelines no follow-up is recommended in low risk patients. However, an optional follow-up dedicated chest CT in 12 months could be obtained in high risk patients. 4. Moderate stenosis of the left renal artery and mild stenosis of the  right renal artery at the ostia. 5. Aortic atherosclerosis. Aortic Atherosclerosis (ICD10-I70.0). Electronically Signed   By: Dahlia Bailiff MD   On: 04/15/2020 18:10    Chart has been reviewed    Assessment/Plan  80 y.o. female with medical history significant of asthma, CHF, CAD, DM, HTN history of carotid artery occlusion hx of pacemaker Admitted for bilateral leg weakness back pain, dehydration and hyponatremia  Present on Admission: . Bilateral leg weakness unclear etiology could be functional secondary to severe back pain.  Secondary to multiple falls  Neurology recommended MRI brain and spine has ordered but given the patient needs to be monitored secondary to history of pacemaker has been delayed CT done earlier showed no evidence of fracture Discussed with radiology. If MRI abnormal will need official neurology consult  . Dehydration -will rehydrate gently given history of systolic heart failure  . Mixed hyperlipidemia -chronic stable continue home meds  . ICD Single chamber [employer] VISIA MRI VR [id number] 04/21/2019 -aware at this will need MRI  . GERD (gastroesophageal reflux disease) chronic stable, continue PPI Epigastric pain noted lipase negative for ordered  GI cocktail CT abdomen nonacute  . Essential hypertension -given initial hypotension will hold home meds for tonight  . Coronary artery disease involving coronary bypass graft of native heart without angina pectoris -hold Plavix for tonight in case she needs any procedures done.  Continue Ranexa Continue statin resume beta-blocker when blood pressure allows Troponin stable nonelevated  . Chronic systolic heart failure (HCC) -- currently appears to be slightly on the dry side, hold home diuretics for tonight and restart when appears euvolemic, carefuly follow fluid status and Cr  . Asthma chronic stable continue home meds  . Hyponatremia -likely secondary to dehydration hold Lasix for tonight  - order urine  electrolytes, Gently rehydrate with normal saline at 50  Check TSH Hold hydrochlorothiazide BuSpar and other psychiatric medications for now  . Lumbar back pain -severe.  Requiring fentanyl. CT abdomen pelvis showed no evidence of prior .  MRI of the spine ordered Patient reports bilateral lower extremity giving out but she is able to take a first few steps Does not report any incontinence urinary or fecal No decrease in sensation But reports bilateral upper and lower extremity tingling  . DM (diabetes mellitus), type 2 with complications (HCC) -  - Order Sensitive     SSI     -  check TSH and HgA1C  - Hold by mouth medications   AKI on CKD -avoid nephrotoxic medications likely secondary to dehydration we will gently rehydrate hold Entresto for tonight .  CP transient currently resolved.  Troponin unremarkable patient on Ranexa   Followed by Dr. Einar Gip as an outpatient Consult as needed  We will email them the patient has been admitted   other plan as per orders DVT prophylaxis:  SCD    Code Status:    Code Status: Prior FULL CODE as per patient  I had personally discussed CODE STATUS with patient    Family Communication:   Family not at  Bedside    Disposition Plan:   likely will need placement for rehabilitation                       Following barriers for discharge:                                                        Pain controlled with PO medications                                                        Will need to be able to tolerate PO                            Will likely need home health,                                           Would benefit from PT/OT eval prior to DC  Ordered  Nutrition    consulted                               Consults called: neuorogy was made aware by ER please re consult if imaging is abnormal  Admission status:  ED Disposition    ED Disposition Condition Comment   Admit  The patient  appears reasonably stabilized for admission considering the current resources, flow, and capabilities available in the ED at this time, and I doubt any other [facility] requiring further screening and/or treatment in the ED prior to admission is  present.        Obs  Level of care    tele  For 24H       Lab Results  Component Value Date   [place] NEGATIVE 04/15/2020     Precautions: admitted as  Covid Negative     PPE: Used by the provider:  P100  eye Goggles,  Gloves    Lynn Carey 04/16/2020, 2:53 AM   [facility]     after 2 AM please page floor coverage PA If 7AM-7PM, please contact the day team taking care of the patient using [email or web address]   Patient was evaluated in the context of the global COVID-19 pandemic, which necessitated consideration that the patient might be at risk for infection with the SARS-CoV-2 virus that causes COVID-19. Institutional protocols and algorithms that pertain to the evaluation of patients at risk for COVID-19 are in a state of rapid change based on information released by regulatory bodies including the CDC and federal and state organizations. These policies and algorithms were followed during the patient's care.

## 2020-04-15 NOTE — ED Triage Notes (Signed)
Pt presents with weakness and frequent falls, 6 falls in the last 5 days. Family noted slurred speech last night at 7pm, speech has improved but still not her normal speech. Normally ambulatory on her own, unable to do so in the last week d/t increase falls. PCP advise pt to come to ED d/t multiple,le calls for falls and new onset slurred speech. Pt also reports tingling to both feet and hands    BP 92/70 repeat 126/70 HR 86 frequent PVC's pacemaker  RR 26 CBG 163 98% RA   LVO 0 to 1

## 2020-04-15 NOTE — ED Notes (Signed)
Patient c/o 8/10 back pain. Provider made aware.

## 2020-04-16 ENCOUNTER — Observation Stay (HOSPITAL_COMMUNITY): Payer: Medicare HMO

## 2020-04-16 DIAGNOSIS — R29898 Other symptoms and signs involving the musculoskeletal system: Secondary | ICD-10-CM

## 2020-04-16 DIAGNOSIS — J3489 Other specified disorders of nose and nasal sinuses: Secondary | ICD-10-CM | POA: Diagnosis not present

## 2020-04-16 DIAGNOSIS — M545 Low back pain, unspecified: Secondary | ICD-10-CM | POA: Diagnosis not present

## 2020-04-16 DIAGNOSIS — R072 Precordial pain: Secondary | ICD-10-CM | POA: Diagnosis not present

## 2020-04-16 DIAGNOSIS — Z8673 Personal history of transient ischemic attack (TIA), and cerebral infarction without residual deficits: Secondary | ICD-10-CM | POA: Diagnosis not present

## 2020-04-16 DIAGNOSIS — G9389 Other specified disorders of brain: Secondary | ICD-10-CM | POA: Diagnosis not present

## 2020-04-16 DIAGNOSIS — N179 Acute kidney failure, unspecified: Secondary | ICD-10-CM | POA: Diagnosis present

## 2020-04-16 DIAGNOSIS — I5022 Chronic systolic (congestive) heart failure: Secondary | ICD-10-CM | POA: Diagnosis not present

## 2020-04-16 LAB — COMPREHENSIVE METABOLIC PANEL
ALT: 14 U/L (ref 0–44)
AST: 18 U/L (ref 15–41)
Albumin: 3.3 g/dL — ABNORMAL LOW (ref 3.5–5.0)
Alkaline Phosphatase: 52 U/L (ref 38–126)
Anion gap: 11 (ref 5–15)
BUN: 14 mg/dL (ref 8–23)
CO2: 24 mmol/L (ref 22–32)
Calcium: 8.8 mg/dL — ABNORMAL LOW (ref 8.9–10.3)
Chloride: 96 mmol/L — ABNORMAL LOW (ref 98–111)
Creatinine, Ser: 1.14 mg/dL — ABNORMAL HIGH (ref 0.44–1.00)
GFR, Estimated: 49 mL/min — ABNORMAL LOW (ref >60)
Glucose, Bld: 125 mg/dL — ABNORMAL HIGH (ref 70–99)
Potassium: 3.1 mmol/L — ABNORMAL LOW (ref 3.5–5.1)
Sodium: 131 mmol/L — ABNORMAL LOW (ref 135–145)
Total Bilirubin: 0.6 mg/dL (ref 0.3–1.2)
Total Protein: 6.1 g/dL — ABNORMAL LOW (ref 6.5–8.1)

## 2020-04-16 LAB — CK: Total CK: 96 U/L (ref 38–234)

## 2020-04-16 LAB — HEMOGLOBIN A1C
Hgb A1c MFr Bld: 5.6 % (ref 4.8–5.6)
Mean Plasma Glucose: 114.02 mg/dL

## 2020-04-16 LAB — CBC WITH DIFFERENTIAL/PLATELET
Abs Immature Granulocytes: 0.04 10*3/uL (ref 0.00–0.07)
Basophils Absolute: 0 10*3/uL (ref 0.0–0.1)
Basophils Relative: 1 %
Eosinophils Absolute: 0.1 10*3/uL (ref 0.0–0.5)
Eosinophils Relative: 2 %
HCT: 35.3 % — ABNORMAL LOW (ref 36.0–46.0)
Hemoglobin: 11.7 g/dL — ABNORMAL LOW (ref 12.0–15.0)
Immature Granulocytes: 1 %
Lymphocytes Relative: 21 %
Lymphs Abs: 1.3 10*3/uL (ref 0.7–4.0)
MCH: 29.1 pg (ref 26.0–34.0)
MCHC: 33.1 g/dL (ref 30.0–36.0)
MCV: 87.8 fL (ref 80.0–100.0)
Monocytes Absolute: 0.8 10*3/uL (ref 0.1–1.0)
Monocytes Relative: 14 %
Neutro Abs: 3.7 10*3/uL (ref 1.7–7.7)
Neutrophils Relative %: 61 %
Platelets: 350 10*3/uL (ref 150–400)
RBC: 4.02 MIL/uL (ref 3.87–5.11)
RDW: 13.4 % (ref 11.5–15.5)
WBC: 6 10*3/uL (ref 4.0–10.5)
nRBC: 0 % (ref 0.0–0.2)

## 2020-04-16 LAB — C-REACTIVE PROTEIN: CRP: 1 mg/dL — ABNORMAL HIGH (ref <1.0)

## 2020-04-16 LAB — CBG MONITORING, ED
Glucose-Capillary: 121 mg/dL — ABNORMAL HIGH (ref 70–99)
Glucose-Capillary: 125 mg/dL — ABNORMAL HIGH (ref 70–99)
Glucose-Capillary: 134 mg/dL — ABNORMAL HIGH (ref 70–99)

## 2020-04-16 LAB — MAGNESIUM: Magnesium: 1.7 mg/dL (ref 1.7–2.4)

## 2020-04-16 LAB — GLUCOSE, CAPILLARY
Glucose-Capillary: 175 mg/dL — ABNORMAL HIGH (ref 70–99)
Glucose-Capillary: 127 mg/dL — ABNORMAL HIGH (ref 70–99)
Glucose-Capillary: 134 mg/dL — ABNORMAL HIGH (ref 70–99)
Glucose-Capillary: 138 mg/dL — ABNORMAL HIGH (ref 70–99)

## 2020-04-16 LAB — SEDIMENTATION RATE: Sed Rate: 17 mm/hr (ref 0–22)

## 2020-04-16 LAB — BRAIN NATRIURETIC PEPTIDE: B Natriuretic Peptide: 515.2 pg/mL — ABNORMAL HIGH (ref 0.0–100.0)

## 2020-04-16 LAB — TSH: TSH: 3.378 u[IU]/mL (ref 0.350–4.500)

## 2020-04-16 LAB — OSMOLALITY: Osmolality: 275 mOsm/kg (ref 275–295)

## 2020-04-16 LAB — PHOSPHORUS: Phosphorus: 3.4 mg/dL (ref 2.5–4.6)

## 2020-04-16 MED ORDER — POTASSIUM CHLORIDE CRYS ER 20 MEQ PO TBCR
40.0000 meq | EXTENDED_RELEASE_TABLET | Freq: Once | ORAL | Status: AC
  Administered 2020-04-16: 40 meq via ORAL
  Filled 2020-04-16: qty 2

## 2020-04-16 MED ORDER — SPIRONOLACTONE 25 MG PO TABS
50.0000 mg | ORAL_TABLET | Freq: Every day | ORAL | Status: DC
  Administered 2020-04-16 – 2020-04-17 (×2): 50 mg via ORAL
  Filled 2020-04-16 (×2): qty 2

## 2020-04-16 MED ORDER — SACUBITRIL-VALSARTAN 97-103 MG PO TABS
1.0000 | ORAL_TABLET | Freq: Two times a day (BID) | ORAL | Status: DC
  Administered 2020-04-16 – 2020-04-17 (×2): 1 via ORAL
  Filled 2020-04-16 (×3): qty 1

## 2020-04-16 MED ORDER — CLOPIDOGREL BISULFATE 75 MG PO TABS: 75.0000 mg | ORAL_TABLET | Freq: Every day | ORAL | Status: DC

## 2020-04-16 MED ORDER — ALUM & MAG HYDROXIDE-SIMETH 200-200-20 MG/5ML PO SUSP
30.0000 mL | Freq: Once | ORAL | Status: AC
  Administered 2020-04-16: 30 mL via ORAL
  Filled 2020-04-16: qty 30

## 2020-04-16 MED ORDER — GADOBUTROL 1 MMOL/ML IV SOLN
6.5000 mL | Freq: Once | INTRAVENOUS | Status: AC | PRN
  Administered 2020-04-16: 6.5 mL via INTRAVENOUS

## 2020-04-16 MED ORDER — CLOPIDOGREL BISULFATE 75 MG PO TABS
75.0000 mg | ORAL_TABLET | Freq: Every day | ORAL | Status: DC
  Administered 2020-04-17: 75 mg via ORAL
  Filled 2020-04-16: qty 1

## 2020-04-16 NOTE — Progress Notes (Signed)
NEW ADMISSION NOTE New Admission Note:   Arrival Method: Stretcher  Mental Orientation: A&O x 4 Telemetry: 85M-14 Assessment: Completed Skin: Intact  IV: LUA, R hand Pain: 0/10  Tubes: none Safety Measures: Safety Fall Prevention Plan has been given, discussed and signed Admission: Completed [facility] [place] Orientation: Patient has been orientated to the room, unit and staff.  Family: none present  Orders have been reviewed and implemented. Will continue to monitor the patient. Call light has been placed within reach and bed alarm has been activated.   Mikki Santee, RN

## 2020-04-16 NOTE — ED Notes (Signed)
Tele  Breakfast Ordered 

## 2020-04-16 NOTE — Progress Notes (Signed)
Inpatient Rehab Admissions Coordinator Note:   Per therapy recommendations, pt was screened for CIR candidacy by Clemens Catholic, [place] CCC-SLP. At this time, Pt. Appears to have functional decline but work up is still pending so I cannot make a judgment of medical necessity. [facility] team will follow and rescreen once work up is complete. Please contact me with any questions.   Clemens Catholic, [place], [place] Admissions Coordinator  [phone] ([place]) [phone] (office)

## 2020-04-16 NOTE — Progress Notes (Signed)
PROGRESS NOTE    Lynn Carey  UUV:[id number] DOB: 1940/07/03 DOA: 04/15/2020 PCP: Glendale Chard, MD   Chief Complaint  Patient presents with  . Weakness  . Fall  Brief Narrative: 80 year old female with history of asthma, CHF, CAD, DM, hypertension, carotid artery occlusion, history of pacemaker comes to the ER with multiple complaints tingling in hands and feet, slurred speech, generalized weakness, multiple falls about 5 in 6 days, recent ED visit 5 days prior to lower extremities for constipation. As per the report for past week she has had trouble ambulating at baseline she is able to walk on her own. She finally was recommended to come to emergency department today again for new onset slurred speech Abdominal pain since have improved and she did have a bowel movement but now she has more of a back pain.Reports she can take a few steps and then her knees just give out She has not been eating well her mouth is dry, Decreased appetite. When she stands up her legs give way.  The weakness started since Wednesday family had to hold her off She did have an episode of chest pain 2 days ago but now resolved.  Described more like tightness and knot sensation in her chest no associated shortness of breath. She took some Renexa and it helped On arrival to emergency department blood pressure 92/70 although on repeat was up to 126/70 CBG 163 satting 98 room air heart rate was 86 with frequent PVCs In th ED COVID-negative,CT head no acute finding, chest x-ray with atelectasis CT angio chest abdomen pelvis-mild diffuse bronchial wall thickening suggestive of bronchitis, bilateral pulmonary nodules follow-up and CT in 2 months advised, moderate stenosis of the left renal artery and mild stenosis of the right renal artery, CT chest no PE. Neurology was consulted by ED advised MRI brain and spine, patient was admitted for bilateral leg weakness, hyponatremia, dehydration.  Subjective: Seen this morning  was complaining of lower extremity weakness was able to move them. No chest pain this morning.  Assessment & Plan:  B/L leg weakness: Along with slurred speech, multiple falls at home, neurology recommended MRI brain and spine and has been ordered has pacemaker in place,pending MRI, if MRI abnormal will need outpatient neuro consult.  Obtain PT OT evaluation. CT head no acute finding.  She is able to move all her extremities.  Dehydration: Encourage oral hydration.  Blood pressure is stable so we will stop IV fluids if needed continues small boluses of ivf. Hypokalemia replete Hyponatremia: Sodium improving with gentle IV hydration watch for fluid overload AKI creatinine improved 1.4-1.1.  Hold off on IV fluids.  Chest pain/CAD hx of CABG: Hx Trop normal. Plavix is held incase she needs procedure, pending mri-if negative resume, continue Crestor, Ranexa and Zetia. Cardiology on board- Op follow up. Chronic systolic heart failure : Compensated euvolemic patient got gentle hydration for dehydration.  We will slowly resume patient's multiple heart failure medications hold Lasix for now, discontinue IV fluids ICD Single chamber Medtronic VISIA MRI VR [id number] 04/21/2019:for MRI today HTN/Mixed hyperlipidemia/Carotid artery stenosis: Hypotensive on admission, blood pressure is stable, patient is on multiple regimen including amlodipine, BiDil, Entresto, Lasix, aldactone- and on hold.  Resume slowly.  Continue Zetia/Crestor  Asthma chronic, stable.  Continue Singulair as needed inhaler  DM W/ Neuropathy, last hemoglobin A1c 5.6.  Blood sugar well controlled continue sensitive sliding scale Recent Labs  Lab 04/15/20 2139 04/16/20 0006 04/16/20 0414 04/16/20 0837 04/16/20 1040  GLUCAP 96 134*  121* 125* 175*   Nutrition: Diet Order            Diet Carb Modified Fluid consistency: Thin; Room service appropriate? Yes  Diet effective now                 Body mass index is 26.78 kg/m  (pended). DVT prophylaxis: SCDs Start: 04/15/20 2341 Code Status:   Code Status: Full Code  Family Communication: plan of care discussed with patient at bedside.  Status is: Observation  Remains hospitalized for ongoing work-up of her back pain leg weakness Dispo: The patient is from: Home              Anticipated d/c is to: CIR          Anticipated d/c date is: 1 day              Patient currently is not medically stable to d/c.  Consultants:see note  Procedures:see note  Culture/Microbiology    Component Value Date/Time   SDES  04/11/2020 0144    URINE, RANDOM Performed at [facility], [address] [place], [place] [address]    [facility]  04/11/2020 0144    NONE Performed at [facility], [address], [place], [place] [address]    CULT  04/11/2020 0144    NO GROWTH Performed at [facility], [place] [address] [place], [place] [address]    REPTSTATUS 04/12/2020 FINAL 04/11/2020 0144    Other culture-see note  Medications: Scheduled Meds: . dorzolamide-timolol  1 drop Both Eyes BID  . ezetimibe  10 mg Oral Daily  . gabapentin  300 mg Oral QID  . insulin aspart  0-9 Units Subcutaneous Q4H  . linaclotide  72 mcg Oral QAC breakfast  . montelukast  10 mg Oral QHS  . pantoprazole  40 mg Oral Daily  . ranolazine  500 mg Oral BID  . rosuvastatin  20 mg Oral Daily   Continuous Infusions: . sodium chloride 50 mL/hr at 04/16/20 1150    Antimicrobials: Anti-infectives (From admission, onward)   None     Objective: Vitals: Today's Vitals   04/16/20 0915 04/16/20 0930 04/16/20 1033 04/16/20 1051  BP: 127/65 117/68 129/89   Pulse: 88 88 81   Resp: 19 19 16    Temp:   97.7 F (36.5 C)   TempSrc:   Oral   SpO2: 99% 100% 98%   Weight:   (P) 64.3 kg   PainSc:    2     Intake/Output Summary (Last 24 hours) at 04/16/2020 1322 Last data filed at 04/16/2020 1151 Gross per 24 hour  Intake 481.65 ml  Output -  Net 481.65 ml   Filed  Weights   04/16/20 1033  Weight: (P) 64.3 kg   Weight change:   Intake/Output from previous day: No intake/output data recorded. Intake/Output this shift: Total I/O In: 481.7 [I.V.:481.7] Out: -   Examination: General exam: AAO X3,ill looking,weak appearing. HEENT:Oral mucosa moist, Ear/Nose WNL grossly,dentition normal. Respiratory system: bilaterally CLEAR,no wheezing or crackles,no use of accessory muscle, non tender. Cardiovascular system: S1 & S2 +, regular, No JVD. Gastrointestinal system: Abdomen soft, NT,ND, BS+. Nervous System:Alert, awake, moving extremities and grossly nonfocal Extremities: No edema, distal peripheral pulses palpable.  Skin: No rashes,no icterus. MSK: Normal muscle bulk,tone, power  Data Reviewed: I have personally reviewed following labs and imaging studies CBC: Recent Labs  Lab 04/10/20 2106 04/15/20 1424 04/16/20 0442  WBC 5.8 5.4  6.0  NEUTROABS 3.3 3.2 3.7  HGB 12.3 13.0 11.7*  HCT 38.2 41.1 35.3*  MCV 90.5 88.6 87.8  PLT 405* 392 683   Basic Metabolic Panel: Recent Labs  Lab 04/10/20 2106 04/15/20 1424 04/16/20 0442  NA 135 127* 131*  K 3.7 4.7 3.1*  CL 96* 91* 96*  CO2 27 24 24   GLUCOSE 134* 89 125*  BUN 11 15 14   CREATININE 1.11* 1.43* 1.14*  CALCIUM 9.4 9.3 8.8*  MG  --   --  1.7  PHOS  --   --  3.4   GFR: Estimated Creatinine Clearance: 34.7 mL/min (A) (by C-G formula based on SCr of 1.14 mg/dL (H)). Liver Function Tests: Recent Labs  Lab 04/10/20 2106 04/15/20 1424 04/16/20 0442  AST 14* 48* 18  ALT 10 17 14   ALKPHOS 56 57 52  BILITOT 0.6 1.0 0.6  PROT 7.8 6.9 6.1*  ALBUMIN 4.0 3.7 3.3*   Recent Labs  Lab 04/10/20 2106  LIPASE 20   No results for input(s): AMMONIA in the last 168 hours. Coagulation Profile: No results for input(s): INR, PROTIME in the last 168 hours. Cardiac Enzymes: Recent Labs  Lab 04/16/20 0500  CKTOTAL 96   BNP (last 3 results) No results for input(s): PROBNP in the last 8760  hours. HbA1C: No results for input(s): HGBA1C in the last 72 hours. CBG: Recent Labs  Lab 04/15/20 2139 04/16/20 0006 04/16/20 0414 04/16/20 0837 04/16/20 1040  GLUCAP 96 134* 121* 125* 175*   Lipid Profile: No results for input(s): CHOL, HDL, LDLCALC, TRIG, CHOLHDL, LDLDIRECT in the last 72 hours. Thyroid Function Tests: Recent Labs    04/16/20 0443  TSH 3.378   Anemia Panel: No results for input(s): VITAMINB12, FOLATE, FERRITIN, TIBC, IRON, RETICCTPCT in the last 72 hours. Sepsis Labs: Recent Labs  Lab 04/15/20 1424  LATICACIDVEN 1.8    Recent Results (from the past 240 hour(s))  Urine culture     Status: Abnormal   Collection Time: 04/10/20  8:19 PM   Specimen: Urine, Clean Catch  Result Value Ref Range Status   Specimen Description URINE, CLEAN CATCH  Final   Special Requests Normal  Final   Culture (A)  Final    <10,000 COLONIES/mL INSIGNIFICANT GROWTH Performed at [facility], [address], [place], [place] [address]    Report Status 04/13/2020 FINAL  Final  Urine culture     Status: None   Collection Time: 04/11/20  1:44 AM   Specimen: Urine, Random  Result Value Ref Range Status   Specimen Description   Final    URINE, RANDOM Performed at [facility], [address] [place], [place] [address]    Special Requests   Final    NONE Performed at [facility], [address] [place], [place] [address]    Culture   Final    NO GROWTH Performed at [facility], [place] [address], [place],  [address]    Report Status 04/12/2020 FINAL  Final  Resp Panel by RT-PCR (Flu A&B, Covid) Nasopharyngeal Swab     Status: None   Collection Time: 04/15/20  2:24 PM   Specimen: Nasopharyngeal Swab; Nasopharyngeal(NP) swabs in vial transport medium  Result Value Ref Range Status   SARS Coronavirus 2 by RT PCR NEGATIVE NEGATIVE Final    Comment: (NOTE) SARS-CoV-2 target nucleic acids are NOT DETECTED.  The SARS-CoV-2 RNA is  generally detectable in upper respiratory specimens  during the acute phase of infection. The lowest concentration of SARS-CoV-2 viral copies this assay can detect is 138 copies/mL. A negative result does not preclude SARS-Cov-2 infection and should not be used as the sole basis for treatment or other patient management decisions. A negative result may occur with  improper specimen collection/handling, submission of specimen other than nasopharyngeal swab, presence of viral mutation(s) within the areas targeted by this assay, and inadequate number of viral copies(<138 copies/mL). A negative result must be combined with clinical observations, patient history, and epidemiological information. The expected result is Negative.  Fact Sheet for Patients:  EntrepreneurPulse.com.au  Fact Sheet for Healthcare Providers:  IncredibleEmployment.be  This test is no t yet approved or cleared by the [place] FDA and  has been authorized for detection and/or diagnosis of SARS-CoV-2 by FDA under an Emergency Use Authorization (EUA). This EUA will remain  in effect (meaning this test can be used) for the duration of the COVID-19 declaration under Section 564(b)(1) of the Act, 21 U.S.C.section 360bbb-3(b)(1), unless the authorization is terminated  or revoked sooner.       Influenza A by PCR NEGATIVE NEGATIVE Final   Influenza B by PCR NEGATIVE NEGATIVE Final    Comment: (NOTE) The Xpert Xpress SARS-CoV-2/FLU/RSV plus assay is intended as an aid in the diagnosis of influenza from Nasopharyngeal swab specimens and should not be used as a sole basis for treatment. Nasal washings and aspirates are unacceptable for Xpert Xpress SARS-CoV-2/FLU/RSV testing.  Fact Sheet for Patients: EntrepreneurPulse.com.au  Fact Sheet for Healthcare Providers: IncredibleEmployment.be  This test is not yet approved or cleared by the [place] FDA and has been authorized for detection and/or diagnosis of SARS-CoV-2 by FDA under an Emergency Use Authorization (EUA). This EUA will remain in effect (meaning this test can be used) for the duration of the COVID-19 declaration under Section 564(b)(1) of the Act, 21 U.S.C. section 360bbb-3(b)(1), unless the authorization is terminated or revoked.  Performed at [facility], [place] [address] [place], [place] [address]      Radiology Studies: CT HEAD WO CONTRAST  Result Date: 04/15/2020 CLINICAL DATA:  Mental status change. Weakness and multiple falls. Slurred speech last night. EXAM: CT HEAD WITHOUT CONTRAST TECHNIQUE: Contiguous axial images were obtained from the base of the skull through the vertex without intravenous contrast. COMPARISON:  10/25/2019 FINDINGS: Brain: Stable degree of atrophy and chronic small vessel ischemia. Remote right parietal infarct. No intracranial hemorrhage, mass effect, or midline shift. Partially empty sella. No hydrocephalus. The basilar cisterns are patent. Basal gangliar mineralization is unchanged, typically senescent. No evidence of territorial infarct or acute ischemia. No extra-axial or intracranial fluid collection. Vascular: Atherosclerosis of skullbase vasculature without hyperdense vessel or abnormal calcification. Skull: No fracture. Slight thickening of the outer table of the right frontal bone is likely an incidental osteoma. Sinuses/Orbits: Paranasal sinuses and mastoid air cells are clear. The visualized orbits are unremarkable. Other: No large scalp hematoma. IMPRESSION: 1. No acute intracranial abnormality. 2. Stable atrophy, chronic small vessel ischemia, and remote right parietal infarct. Electronically Signed   By: Keith Rake M.D.   On: 04/15/2020 17:54   DG Chest Portable 1 View  Result Date: 04/15/2020 CLINICAL DATA:  Weakness. EXAM: PORTABLE CHEST 1 VIEW COMPARISON:  January 19, 2020. FINDINGS: Stable cardiomegaly.  Sternotomy wires are noted. Left-sided pacemaker is unchanged in position. No pneumothorax or pleural effusion is noted. Left midlung subsegmental atelectasis or scarring is noted. Right lung is clear. Bony thorax  is unremarkable. IMPRESSION: Left midlung subsegmental atelectasis or scarring. Electronically Signed   By: Marijo Conception M.D.   On: 04/15/2020 14:27   CT Angio Chest/Abd/Pel for Dissection W and/or Wo Contrast  Result Date: 04/15/2020 CLINICAL DATA:  Abdominal pain aortic dissection suspected. EXAM: CT ANGIOGRAPHY CHEST, ABDOMEN AND PELVIS TECHNIQUE: Non-contrast CT of the chest was initially obtained. Multidetector CT imaging through the chest, abdomen and pelvis was performed using the standard protocol during bolus administration of intravenous contrast. Multiplanar reconstructed images and MIPs were obtained and reviewed to evaluate the vascular anatomy. CONTRAST:  48mL OMNIPAQUE IOHEXOL 350 MG/ML SOLN COMPARISON:  CT abdomen pelvis April 12, 2019. FINDINGS: CTA CHEST FINDINGS Cardiovascular: Preferential opacification of the thoracic aorta. No evidence of thoracic aortic aneurysm or dissection. Changes of prior CABG. Dense calcifications of the native coronary arteries. No pericardial effusion. Mediastinum/Nodes: No enlarged mediastinal, hilar, or axillary lymph nodes. Thyroid gland, trachea, and esophagus demonstrate no significant findings. Lungs/Pleura: Was ache attenuation of the lungs. Mild diffuse bronchial wall thickening. There are few scattered 3 mm or smaller pulmonary nodules for instance a 3 mm subpleural nodule in the right lower lobe (series 7, image 65) and a 3 mm subpleural left lower lobe nodule (series 7, image 106). Musculoskeletal: Prior median sternotomy. No chest wall abnormality. No acute or significant osseous findings. Degenerative change of the thoracic spine and bilateral glenohumeral joints. Review of the MIP images confirms the above findings. CTA ABDOMEN AND  PELVIS FINDINGS VASCULAR Aorta: Aortic atherosclerosis. Normal caliber aorta without aneurysm, dissection, vasculitis or significant stenosis. Celiac: Patent without evidence of aneurysm, dissection, vasculitis or significant stenosis. SMA: Patent without evidence of aneurysm, dissection, vasculitis or significant stenosis. Renals: Calcified noncalcified plaque at the renal ostia with mild stenosis on the right and moderate stenosis on the left. IMA: Patent without evidence of aneurysm, dissection, vasculitis or significant stenosis. Inflow: Patent without evidence of aneurysm, dissection, vasculitis or significant stenosis. Veins: No obvious venous abnormality within the limitations of this arterial phase study. Review of the MIP images confirms the above findings. NON-VASCULAR Hepatobiliary: Cyst in the left lobe of the liver. No suspicious arterially hepatic lesions. No gallstones, gallbladder wall thickening, or biliary dilatation. Pancreas: Unremarkable. No pancreatic ductal dilatation or surrounding inflammatory changes. Spleen: Normal in size without focal abnormality. Adrenals/Urinary Tract: Adrenal glands are unremarkable. Right lower pole cortical scarring similar to prior. No hydronephrosis. Bladder is obscured by streak artifact from bilateral hip prostheses. Stomach/Bowel: Stomach is within normal limits. Appendix is absent. No evidence of bowel wall thickening, distention, or inflammatory changes, on this study which is partially obscured in the pelvis by streak artifact from bilateral hip prostheses. Lymphatic: No pathologically enlarged abdominal or pelvic lymph nodes. Reproductive: Uterus and bilateral adnexa are unremarkable. Other: No abdominal wall hernia or abnormality. Left inguinal surgical clips. No abdominopelvic ascites. Musculoskeletal: Bilateral total hip arthroplasties. Degenerative change of the thoracolumbar spine. No acute osseous abnormality. Review of the MIP images confirms the  above findings. IMPRESSION: 1. No evidence of aortic aneurysm or dissection. 2. Mild diffuse bronchial wall thickening with mosaic attenuation of the lungs, suggestive of bronchitis. 3. There are few scattered 3 mm or smaller bilateral pulmonary nodules. Which per Fleischner society guidelines no follow-up is recommended in low risk patients. However, an optional follow-up dedicated chest CT in 12 months could be obtained in high risk patients. 4. Moderate stenosis of the left renal artery and mild stenosis of the right renal artery at the ostia. 5. Aortic atherosclerosis.  Aortic Atherosclerosis (ICD10-I70.0). Electronically Signed   By: Dahlia Bailiff MD   On: 04/15/2020 18:10     LOS: 0 days   Antonieta Pert, MD [facility]  04/16/2020, 1:22 PM

## 2020-04-16 NOTE — Progress Notes (Signed)
04/16/20 1347  PT Evaluation Information  Last PT Received On 04/16/20  Assistance Needed +2  PT/OT/SLP Co-Evaluation/Treatment Yes  Reason for Co-Treatment For patient/therapist safety;To address functional/ADL transfers  PT goals addressed during session Mobility/safety with mobility;Balance;Proper use of DME  History of Present Illness 80 y.o. female with medical history significant of asthma, CHF, CAD, DM, HTN history of carotid artery occlusion hx of pacemaker. Presented with weakness in the legs tingling in hands and feet slurred speech. MRI pending  Precautions  Precautions Fall  Restrictions  Weight Bearing Restrictions No  Home Living  Family/patient expects to be discharged to: Private residence  Living Arrangements Children  Available Help at Discharge Available 24 hours/day (dtr has daycare in the house)  Type of [place] to enter  Entrance Stairs-Number of Steps 1  Entrance Stairs-Rails None  Home Layout Two level;Able to live on main level with bedroom/bathroom  Alternate Level Stairs-Number of Steps flight  Bathroom Shower/Tub Walk-in Cytogeneticist Yes  Home Equipment None  Prior Function  Level of Independence Independent  Communication  Communication No difficulties  Pain Assessment  Pain Assessment 0-10  Pain Score 3  Pain Location stomach  Pain Descriptors / Indicators Aching  Pain Intervention(s) Limited activity within patient's tolerance;Monitored during session;Repositioned  Cognition  Arousal/Alertness Awake/alert  Behavior During Therapy WFL for tasks assessed/performed  Overall Cognitive Status No family/caregiver present to determine baseline cognitive functioning  General Comments most likely at basline  Upper Extremity Assessment  Upper Extremity Assessment Defer to OT evaluation  Lower Extremity Assessment  Lower Extremity Assessment RLE deficits/detail;LLE deficits/detail   RLE Deficits / Details Grossly at 3/5 throughout. Notable buckling in RLE. Reports decreased sensation on outer thigh and in bilateral shins.  LLE Deficits / Details Grossly at 3/5 throughout. Notable buckling in RLE. Reports decreased sensation on outer thigh and in bilateral shins.  Cervical / Trunk Assessment  Cervical / Trunk Assessment Normal  Bed Mobility  Overal bed mobility Needs Assistance  Bed Mobility Supine to Sit;Sit to Supine  Supine to sit Mod assist  Sit to supine Mod assist  General bed mobility comments Assist for LE and trunk assist.  Transfers  Overall transfer level Needs assistance  Equipment used Rolling walker (2 wheeled)  Transfers Sit to/from Stand;Stand Pivot Transfers  Sit to Stand Mod assist;+2 physical assistance  Stand pivot transfers Mod assist;+2 physical assistance  General transfer comment buckling of knees bilaterally - RLE appears weaker than L; ataxic type gait when taking steps towards chair. Pt reports difficulty controlling steps.  Balance  Overall balance assessment History of Falls;Needs assistance  Sitting-balance support No upper extremity supported  Sitting balance-Leahy Scale Fair  Standing balance support Bilateral upper extremity supported  Standing balance-Leahy Scale Poor  Standing balance comment Reliant on UE and external support  General Comments  General comments (skin integrity, edema, etc.) Has had 6 falls last week per pt; daughter and son help her off the floor  PT - End of Session  Equipment Utilized During Treatment Gait belt  Activity Tolerance Patient tolerated treatment well  Patient left in bed;with call bell/phone within reach (on stretcher in ED)  Nurse Communication Mobility status  PT Assessment  PT Recommendation/Assessment Patient needs continued PT services  PT Visit Diagnosis History of falling (Z91.81);Repeated falls (R29.6);Muscle weakness (generalized) (M62.81)  PT Problem List Decreased  strength;Decreased activity tolerance;Decreased balance;Decreased mobility;Decreased coordination;Decreased knowledge of use of DME;Decreased knowledge of precautions  PT Plan  PT Frequency (ACUTE ONLY) Min 3X/week  PT Treatment/Interventions (ACUTE ONLY) DME instruction;Gait training;Functional mobility training;Therapeutic activities;Therapeutic exercise;Balance training;Patient/family education;Cognitive remediation;Neuromuscular re-education  AM-PAC PT "6 Clicks" Mobility Outcome Measure (Version 2)  Help needed turning from your back to your side while in a flat bed without using bedrails? 2  Help needed moving from lying on your back to sitting on the side of a flat bed without using bedrails? 2  Help needed moving to and from a bed to a chair (including a wheelchair)? 2  Help needed standing up from a chair using your arms (e.g., wheelchair or bedside chair)? 2  Help needed to walk in hospital room? 2  Help needed climbing 3-5 steps with a railing?  1  6 Click Score 11  Consider Recommendation of Discharge To: CIR/SNF/LTACH  PT Recommendation  Recommendations for Other Services Rehab consult  Follow Up Recommendations CIR  PT equipment Rolling walker with 5" wheels;Wheelchair cushion (measurements PT);Wheelchair (measurements PT);3in1 (PT)  Individuals Consulted  Consulted and Agree with Results and Recommendations Patient  Acute Rehab PT Goals  Patient Stated Goal to get stronger  PT Goal Formulation With patient  Time For Goal Achievement 04/30/20  Potential to Achieve Goals Good  PT Time Calculation  PT Start Time (ACUTE ONLY) 0942  PT Stop Time (ACUTE ONLY) 1005  PT Time Calculation (min) (ACUTE ONLY) 23 min  PT General Charges  $$ ACUTE PT VISIT 1 Visit  PT Evaluation  $PT Eval Moderate Complexity 1 Mod  Written Expression  Dominant Hand Left    Pt admitted secondary to problem above with deficits above. Pt presenting with BLE weakness (RLE>LLE) and decreased sensation  throughout BLEs. Pt requiring mod A +2 to stand and transfer to and from chair. Noted bilateral knee buckling. Pt was previously independent prior to onset of symptoms. Feel pt would benefit from CIR level therapies. Will continue to follow acutely.   Reuel Derby, PT, DPT  Acute Rehabilitation Services  Pager: [phone] Office: [phone]

## 2020-04-16 NOTE — Progress Notes (Signed)
Changed patient's device to MRI safe mode OVO per [place], [place].   Nurse came down with her and will stay to monitor.   Will set device back to previous settings at completion of scan and send transmission.

## 2020-04-16 NOTE — Progress Notes (Signed)
Informed of MRI for today.   Device system confirmed to be MRI conditional, with implant date > 6 weeks ago and no evidence of abandoned or epicardial leads in review of most recent CXR Interrogation from today reviewed, pt is currently VS at 85 bpm Change device settings for MRI to OVO.  Tachy-therapies to off if applicable.  Program device back to pre-MRI settings after completion of exam.  Annamaria Helling  04/16/2020 2:01 PM

## 2020-04-16 NOTE — Progress Notes (Addendum)
Occupational Therapy Evaluation  PTA pt lives with family and is independent with ADL, IADL and mobility without use of AD. Pt has had 6 falls in the past week due to BLE weakness. Pt requires Mod A +2 for limited mobility @ RW level with B knees "buckling" - RLE appears weaker than LLE. Mod A for LB ADL tasks. Pt also complains of new onset of "tingling" in hands. Awaiting MRI of spine. Given pt's PLOF and motivation to return to independence, recommend aggressive rehab at [facility]. Will follow acutely.  VSS during session.     04/16/20 0800  OT Visit Information  Last OT Received On 04/16/20  Assistance Needed +2  PT/OT/SLP Co-Evaluation/Treatment Yes  Reason for Co-Treatment For patient/therapist safety;To address functional/ADL transfers  OT goals addressed during session ADL's and self-care  History of Present Illness 80 y.o. female with medical history significant of asthma, CHF, CAD, DM, HTN history of carotid artery occlusion hx of pacemaker. Presented with weakness in the legs tingling in hands and feet slurred speech  Precautions  Precautions [place] expects to be discharged to: Private residence  Living Arrangements Children  Available Help at Discharge Available 24 hours/day (dtr has daycare in the house)  Type of [place] to enter  Entrance Stairs-Number of Steps 1  Entrance Stairs-Rails None  Home Layout Two level;Able to live on main level with bedroom/bathroom  Alternate Level Stairs-Number of Steps flight  Bathroom Shower/Tub Walk-in Cytogeneticist Yes  Home Equipment None  Prior Function  Level of Independence Independent  Communication  Communication No difficulties  Pain Assessment  Pain Assessment 0-10  Pain Score 3  Pain Location stomach  Pain Descriptors / Indicators Aching  Pain Intervention(s) Limited activity within patient's tolerance  Cognition  Arousal/Alertness  Awake/alert  Behavior During Therapy WFL for tasks assessed/performed  Overall Cognitive Status No family/caregiver present to determine baseline cognitive functioning  General Comments most likely at basline  Upper Extremity Assessment  Upper Extremity Assessment Generalized weakness (complains of "tingling" in B hands for last 1-2 weeks)  Lower Extremity Assessment  Lower Extremity Assessment Defer to PT evaluation; complaining of abnormal sensation which appesr worse in lateral distribution BLE; ataxic gait pattern  Cervical / Trunk Assessment  Cervical / Trunk Assessment Normal  ADL  Overall ADL's  Needs assistance/impaired  Grooming Set up;Sitting  Upper Body Bathing Set up;Sitting  Lower Body Bathing Moderate assistance;Sit to/from stand  Upper Body Dressing  Set up;Sitting  Lower Body Dressing Moderate assistance;Sit to/from Retail buyer Moderate assistance;+2 for physical assistance;Ambulation;Stand-pivot  Toileting- Clothing Manipulation and Hygiene Moderate assistance;Sitting/lateral lean  Toileting - Clothing Manipulation Details (indicate cue type and reason) incontinenet at times  Functional mobility during ADLs Moderate assistance;+2 for physical assistance;Cueing for safety;Rolling walker  Vision- History  Baseline Vision/History Wears glasses  Wears Glasses At all times  Vision- Assessment  Vision Assessment? No apparent visual deficits  Bed Mobility  Overal bed mobility Needs Assistance  Bed Mobility Supine to Sit;Sit to Supine  Supine to sit Mod assist  Sit to supine Mod assist  Transfers  Overall transfer level Needs assistance  Equipment used Rolling walker (2 wheeled)  Transfers Sit to/from [employer] Transfers  Sit to Stand Mod assist;+2 physical assistance  Stand pivot transfers Mod assist;+2 physical assistance  General transfer comment buckling of knees - RLE appears weaker than L; ataxic type gait  Balance  Overall balance  assessment  History of Falls;Needs assistance  Sitting balance-Leahy Scale Fair  Standing balance-Leahy Scale Poor  General Comments  General comments (skin integrity, edema, etc.) Has had 6 falls last week per pt; daughter and son help her off the floor  OT - End of Session  Equipment Utilized During Treatment Gait belt;Rolling walker  Activity Tolerance Patient tolerated treatment well  Patient left in bed;with call bell/phone within reach  Nurse Communication Mobility status;Other (comment) (DC needs)  OT Assessment  OT Recommendation/Assessment Patient needs continued OT Services  OT Visit Diagnosis Unsteadiness on feet (R26.81);Other abnormalities of gait and mobility (R26.89);Repeated falls (R29.6);Muscle weakness (generalized) (M62.81);Ataxia, unspecified (R27.0);Pain  Pain - part of body  (lower abdomen)  OT Problem List Decreased strength;Decreased activity tolerance;Impaired balance (sitting and/or standing);Decreased cognition;Decreased safety awareness;Decreased knowledge of use of DME or AE;Decreased knowledge of precautions;Obesity;Impaired tone;Pain  OT Plan  OT Frequency (ACUTE ONLY) Min 2X/week  OT Treatment/Interventions (ACUTE ONLY) Self-care/ADL training;Therapeutic exercise;Neuromuscular education;Energy conservation;DME and/or AE instruction;Therapeutic activities;Patient/family education;Balance training  AM-PAC OT "6 Clicks" Daily Activity Outcome Measure (Version 2)  Help from another person eating meals? 4  Help from another person taking care of personal grooming? 3  Help from another person toileting, which includes using toliet, bedpan, or urinal? 2  Help from another person bathing (including washing, rinsing, drying)? 2  Help from another person to put on and taking off regular upper body clothing? 3  Help from another person to put on and taking off regular lower body clothing? 2  6 Click Score 16  OT Recommendation  Recommendations for Other Services Rehab consult   Follow Up Recommendations Supervision/Assistance - 24 hour;CIR  OT Equipment 3 in 1 bedside commode  Individuals Consulted  Consulted and Agree with Results and Recommendations Patient  Acute Rehab OT Goals  Patient Stated Goal to get stronger  OT Goal Formulation With patient  Time For Goal Achievement 04/30/20  Potential to Achieve Goals Good  OT Time Calculation  OT Start Time (ACUTE ONLY) 0941  OT Stop Time (ACUTE ONLY) 1006  OT Time Calculation (min) 25 min  OT General Charges  $OT Visit 1 Visit  OT Evaluation  $OT Eval Moderate Complexity 1 Mod  Written Expression  Dominant Hand Left  Maurie Boettcher, OT/L   Acute OT Clinical Specialist [place] Pager [phone] Office [phone]

## 2020-04-16 NOTE — Consult Note (Signed)
CARDIOLOGY CONSULT NOTE  Patient ID: Lynn Carey MRN: [id number] DOB/AGE: 80-Sep-1942 80 y.o.  Admit date: 04/15/2020 Referring Physician: [facility] hospitalist Reason for Consultation: Chest pain  HPI:    80 y.o. African American female with coronary artery disease status post CABG 1997, PCI to SVG 06/23/17, ischemic cardiomyopathy EF 20%, hypertension, hyperlipidemia, bilateral stable moderate carotid stenoses, admitted with lower abdominal pain, and lower extremity weakness.  Cardiology consult regarding chest pain.  Patient has been having constipation and lower abdominal pain for last couple of days.  She underwent CT abdomen 2 days ago, that did not show any acute pathology.  Patient presented to [facility] ER on 04/15/2020, with complaints of bilateral leg weakness.  Patient states that she is able to stand up, but unable to walk.  She has had at least 6 falls due to same.  On 04/13/2020 tonight, patient had a "knot in her chest", resolved overnight.  Patient has not taken any sublingual nutrition, as she was out of it.  Patient has not had any recurrence of chest pain since then.  While in the hospital, EKG has showed unchanged lateral T wave inversion in the setting of sinus rhythm, LAFB, LVH.  Serial troponins have been flat and negative.  Past Medical History:  Diagnosis Date  . Anginal pain ([place])    h/o, denies today   . Arthritis    hip - both, shot in L shoulder- 2 weeks ago  . Asthma   . Carotid artery occlusion   . CHF (congestive heart failure) ([place])   . Chronic systolic heart failure ([place]) 09/19/2018  . Complication of anesthesia   . Coronary artery disease   . Diabetes mellitus without complication ([place])   . Encounter for assessment of implantable cardioverter-defibrillator (ICD) 08/30/2019  . GERD (gastroesophageal reflux disease)   . Hypertension    followed by Dr. Einar Gip  . ICD Single chamber Medtronic VISIA MRI VR [id number] 04/21/2019 04/21/2019  . MYOCARDIAL  INFARCTION 05/25/2007   Qualifier: History of  By: Milana Obey, Doroteo Bradford    . Myocardial infarction ([place]) 1997  . PONV (postoperative nausea and vomiting)   . Shortness of breath      Past Surgical History:  Procedure Laterality Date  . CARDIAC CATHETERIZATION  05/2012   see note  . CORONARY ARTERY BYPASS GRAFT  [address]  . CORONARY STENT INTERVENTION Right 06/18/2017   Procedure: CORONARY STENT INTERVENTION;  Surgeon: Adrian Prows, MD;  Location: [place] [facility];  Service: Cardiovascular;  Laterality: Right;  . EYE SURGERY     both eyes, laser procedure  . ICD IMPLANT N/A 04/21/2019   Procedure: ICD IMPLANT;  Surgeon: Deboraha Sprang, MD;  Location: [place] [facility];  Service: Cardiovascular;  Laterality: N/A;  . JOINT REPLACEMENT Left 12/13/12   hip  . LEFT HEART CATH AND CORONARY ANGIOGRAPHY N/A 11/25/2017   Procedure: LEFT HEART CATH AND CORONARY ANGIOGRAPHY;  Surgeon: [name], MD;  Location: [place] [facility];  Service: Cardiovascular;  Laterality: N/A;  . LEFT HEART CATH AND CORS/GRAFTS ANGIOGRAPHY N/A 06/18/2017   Procedure: LEFT HEART CATH AND CORS/GRAFTS ANGIOGRAPHY;  Surgeon: Adrian Prows, MD;  Location: [place] [facility];  Service: Cardiovascular;  Laterality: N/A;  . LEFT HEART CATHETERIZATION WITH CORONARY/GRAFT ANGIOGRAM N/A 04/21/2011   Procedure: LEFT HEART CATHETERIZATION WITH Beatrix Fetters;  Surgeon: Laverda Page, MD;  Location: [facility] C[facility] LAB;  Service: Cardiovascular;  Laterality: N/A;  . PERCUTANEOUS CORONARY INTERVENTION-BALLOON ONLY  Right 05/12/2011   Procedure: PERCUTANEOUS CORONARY INTERVENTION-BALLOON ONLY;  Surgeon: Laverda Page, MD;  Location: [facility] CATH LAB;  Service: Cardiovascular;  Laterality: Right;  . TOTAL HIP ARTHROPLASTY Left 12/13/2012   Procedure: TOTAL HIP ARTHROPLASTY ANTERIOR APPROACH;  Surgeon: Hessie Dibble, MD;  Location: [place];  Service: Orthopedics;  Laterality: Left;  . TOTAL HIP ARTHROPLASTY Right 01/30/2014    Procedure: TOTAL HIP ARTHROPLASTY ANTERIOR APPROACH;  Surgeon: Hessie Dibble, MD;  Location: [place];  Service: Orthopedics;  Laterality: Right;  . TUBAL LIGATION       Family History  Problem Relation Age of Onset  . Allergies Mother   . Asthma Mother   . Heart disease Mother   . Heart attack Mother   . Allergies Son   . Asthma Son   . Heart disease Father   . Heart attack Father   . Breast cancer Daughter 79     Social History: Social History   Socioeconomic History  . Marital status: Divorced    Spouse name: Not on file  . Number of children: 4  . Years of education: Not on file  . Highest education level: Not on file  Occupational History  . Not on file  Tobacco Use  . Smoking status: Former Smoker    Packs/day: 0.50    Years: 2.00    Pack years: 1.00    Types: Cigarettes    Quit date: 12/08/1983    Years since quitting: 36.3  . Smokeless tobacco: Never Used  Vaping Use  . Vaping Use: Never used  Substance and Sexual Activity  . Alcohol use: No  . Drug use: No  . Sexual activity: Not Currently  Other Topics Concern  . Not on file  Social History Narrative  . Not on file   Social Determinants of Health   Financial Resource Strain: Low Risk   . Difficulty of Paying Living Expenses: Not hard at all  Food Insecurity: No Food Insecurity  . Worried About Charity fundraiser in the Last Year: Never true  . Ran Out of Food in the Last Year: Never true  Transportation Needs: No Transportation Needs  . Lack of Transportation (Medical): No  . Lack of Transportation (Non-Medical): No  Physical Activity: Insufficiently Active  . Days of Exercise per Week: 3 days  . Minutes of Exercise per Session: 10 min  Stress: No Stress Concern Present  . Feeling of Stress : Not at all  Social Connections: Not on file  Intimate Partner Violence: Not on file     Medications Prior to Admission  Medication Sig Dispense Refill Last Dose  . acetaminophen (TYLENOL) 650 MG CR  tablet Take 650 mg by mouth every 8 (eight) hours as needed for pain.   04/14/2020 at Unknown time  . albuterol (VENTOLIN HFA) 108 (90 Base) MCG/ACT inhaler Inhale 2 puffs into the lungs every 4 (four) hours as needed for wheezing or shortness of breath. 6.7 g 3 04/13/2020  . amLODipine (NORVASC) 2.5 MG tablet TAKE 1 TABLET(2.5 MG) BY MOUTH DAILY (Patient taking differently: Take 2.5 mg by mouth daily.) 30 tablet 5 04/14/2020 at Unknown time  . BIDIL 20-37.5 MG tablet TAKE 2 TABLETS BY MOUTH THREE TIMES DAILY (Patient taking differently: Take 2 tablets by mouth 3 (three) times daily.) 180 tablet 0 04/14/2020 at Unknown time  . Cholecalciferol (VITAMIN D) 2000 UNITS CAPS Take 2,000 Units by mouth daily.    04/14/2020 at Unknown time  . clopidogrel (PLAVIX)  75 MG tablet TAKE 1 TABLET EVERY DAY (Patient taking differently: Take 75 mg by mouth.) 90 tablet 0 04/14/2020 at 0800  . dorzolamide-timolol (COSOPT) 22.3-6.8 MG/ML ophthalmic solution Place 1 drop into both eyes 2 (two) times daily.   Past Week at Unknown time  . ezetimibe (ZETIA) 10 MG tablet TAKE 1 TABLET EVERY DAY (Patient taking differently: Take 10 mg by mouth daily.) 90 tablet 2 04/14/2020 at 0800  . famotidine (PEPCID) 20 MG tablet Take 20 mg by mouth at bedtime as needed for heartburn or indigestion.   Past Week at Unknown time  . furosemide (LASIX) 40 MG tablet TAKE 1 TABLET(40 MG) BY MOUTH DAILY (Patient taking differently: Take 40 mg by mouth daily.) 90 tablet 3 04/14/2020 at Unknown time  . linaclotide (LINZESS) 72 MCG capsule Take 1 capsule (72 mcg total) by mouth daily before breakfast. 30 capsule 0 04/15/2020 at Unknown time  . metoprolol succinate (TOPROL-XL) 50 MG 24 hr tablet Take 1 tablet (50 mg total) by mouth daily. Take with or immediately following a meal. 30 tablet 2 04/14/2020 at 8pm  . montelukast (SINGULAIR) 10 MG tablet Take 1 tablet (10 mg total) by mouth at bedtime. 30 tablet 0 04/14/2020 at Unknown time  . nitroGLYCERIN (NITROSTAT) 0.4 MG  SL tablet Place 0.4 mg under the tongue every 5 (five) minutes as needed for chest pain.   unk  . pantoprazole (PROTONIX) 40 MG tablet TAKE 1 TABLET EVERY DAY (Patient taking differently: Take 40 mg by mouth daily.) 90 tablet 1 04/14/2020 at Unknown time  . ranolazine (RANEXA) 500 MG 12 hr tablet Take 2 tablets (1,000 mg total) by mouth 2 (two) times daily. (Patient taking differently: Take 500 mg by mouth 2 (two) times daily.) 60 tablet 6 04/14/2020 at Unknown time  . rosuvastatin (CRESTOR) 40 MG tablet TAKE 1/2 TABLET EVERY DAY (Patient taking differently: Take 20 mg by mouth daily.) 45 tablet 3 04/14/2020 at Unknown time  . sacubitril-valsartan (ENTRESTO) 97-103 MG Take 1 tablet by mouth 2 (two) times daily. 120 tablet 3 04/14/2020 at Unknown time  . sitaGLIPtin (JANUVIA) 50 MG tablet Take 1 tablet (50 mg total) by mouth daily. (Patient taking differently: Take 50 mg by mouth every Monday, Wednesday, and Friday.) 90 tablet 1 04/13/2020  . spironolactone (ALDACTONE) 50 MG tablet TAKE 1 TABLET BY MOUTH EVERY DAY (Patient taking differently: Take 50 mg by mouth daily.) 90 tablet 2 04/14/2020 at Unknown time  . temazepam (RESTORIL) 15 MG capsule Take 15 mg by mouth at bedtime as needed for sleep.  1 04/14/2020 at Unknown time  . doxycycline (VIBRAMYCIN) 100 MG capsule Take 1 capsule (100 mg total) by mouth 2 (two) times daily. (Patient not taking: No sig reported) 14 capsule 0 Not Taking at Unknown time  . gabapentin (NEURONTIN) 300 MG capsule Take 1 capsule (300 mg total) by mouth 4 (four) times daily. 120 capsule 2     Review of Systems  Constitutional: Negative for decreased appetite, malaise/fatigue, weight gain and weight loss.  HENT: Negative for congestion.   Eyes: Negative for visual disturbance.  Cardiovascular: Negative for chest pain, dyspnea on exertion, leg swelling, palpitations and syncope.  Respiratory: Negative for cough.   Endocrine: Negative for cold intolerance.  Hematologic/Lymphatic: Does  not bruise/bleed easily.  Skin: Negative for itching and rash.  Musculoskeletal: Negative for myalgias.  Gastrointestinal: Positive for constipation. Negative for abdominal pain, nausea and vomiting.  Genitourinary: Negative for dysuria.  Neurological: Positive for focal weakness (B?l LE).  Negative for dizziness and weakness.  Psychiatric/Behavioral: The patient is not nervous/anxious.   All other systems reviewed and are negative.     Physical Exam: Physical Exam Vitals and nursing note reviewed.  Constitutional:      General: She is not in acute distress.    Appearance: She is well-developed.  HENT:     Head: Normocephalic and atraumatic.  Eyes:     Conjunctiva/sclera: Conjunctivae normal.     Pupils: Pupils are equal, round, and reactive to light.  Neck:     Vascular: No JVD.  Cardiovascular:     Rate and Rhythm: Normal rate and regular rhythm.     Pulses: Intact distal pulses.          Carotid pulses are on the right side with bruit and on the left side with bruit.      Femoral pulses are 2+ on the right side and 2+ on the left side.      Dorsalis pedis pulses are 1+ on the right side and 0 on the left side.       Posterior tibial pulses are 0 on the right side and 0 on the left side.     Heart sounds: No murmur heard.   Pulmonary:     Effort: Pulmonary effort is normal.     Breath sounds: Normal breath sounds. No wheezing or rales.  Abdominal:     General: Bowel sounds are normal.     Palpations: Abdomen is soft.     Tenderness: There is no rebound.  Musculoskeletal:        General: No tenderness. Normal range of motion.     Left lower leg: No edema.  Lymphadenopathy:     Cervical: No cervical adenopathy.  Skin:    General: Skin is warm and dry.  Neurological:     Mental Status: She is alert and oriented to person, place, and time.     Cranial Nerves: No cranial nerve deficit.     Comments: 4/5 b/l LE motor strength      Labs:   Lab Results  Component  Value Date   WBC 6.0 04/16/2020   HGB 11.7 (L) 04/16/2020   HCT 35.3 (L) 04/16/2020   MCV 87.8 04/16/2020   PLT 350 04/16/2020    Recent Labs  Lab 04/16/20 0442  NA 131*  K 3.1*  CL 96*  CO2 24  BUN 14  CREATININE 1.14*  CALCIUM 8.8*  PROT 6.1*  BILITOT 0.6  ALKPHOS 52  ALT 14  AST 18  GLUCOSE 125*    Lipid Panel     Component Value Date/Time   CHOL 159 07/26/2019 1139   TRIG 134 07/26/2019 1139   HDL 55 07/26/2019 1139   CHOLHDL 2.9 07/26/2019 1139   LDLCALC 81 07/26/2019 1139    HEMOGLOBIN A1C Lab Results  Component Value Date   HGBA1C 5.6 07/26/2019    Results for SOMALY, MARTENEY (MRN [id number]) as of 04/16/2020 12:49  Ref. Range 04/15/2020 14:24 04/15/2020 16:06  Troponin I (High Sensitivity) Latest Ref Range: <18 ng/L 12 12    TSH Recent Labs    07/26/19 1139 04/16/20 0443  TSH 2.180 3.378      Radiology: CT HEAD WO CONTRAST  Result Date: 04/15/2020 CLINICAL DATA:  Mental status change. Weakness and multiple falls. Slurred speech last night. EXAM: CT HEAD WITHOUT CONTRAST TECHNIQUE: Contiguous axial images were obtained from the base of the skull through the vertex without intravenous contrast. COMPARISON:  10/25/2019 FINDINGS: Brain: Stable degree of atrophy and chronic small vessel ischemia. Remote right parietal infarct. No intracranial hemorrhage, mass effect, or midline shift. Partially empty sella. No hydrocephalus. The basilar cisterns are patent. Basal gangliar mineralization is unchanged, typically senescent. No evidence of territorial infarct or acute ischemia. No extra-axial or intracranial fluid collection. Vascular: Atherosclerosis of skullbase vasculature without hyperdense vessel or abnormal calcification. Skull: No fracture. Slight thickening of the outer table of the right frontal bone is likely an incidental osteoma. Sinuses/Orbits: Paranasal sinuses and mastoid air cells are clear. The visualized orbits are unremarkable. Other: No large  scalp hematoma. IMPRESSION: 1. No acute intracranial abnormality. 2. Stable atrophy, chronic small vessel ischemia, and remote right parietal infarct. Electronically Signed   By: Keith Rake M.D.   On: 04/15/2020 17:54   DG Chest Portable 1 View  Result Date: 04/15/2020 CLINICAL DATA:  Weakness. EXAM: PORTABLE CHEST 1 VIEW COMPARISON:  January 19, 2020. FINDINGS: Stable cardiomegaly. Sternotomy wires are noted. Left-sided pacemaker is unchanged in position. No pneumothorax or pleural effusion is noted. Left midlung subsegmental atelectasis or scarring is noted. Right lung is clear. Bony thorax is unremarkable. IMPRESSION: Left midlung subsegmental atelectasis or scarring. Electronically Signed   By: Marijo Conception M.D.   On: 04/15/2020 14:27   CT Angio Chest/Abd/Pel for Dissection W and/or Wo Contrast  Result Date: 04/15/2020 CLINICAL DATA:  Abdominal pain aortic dissection suspected. EXAM: CT ANGIOGRAPHY CHEST, ABDOMEN AND PELVIS TECHNIQUE: Non-contrast CT of the chest was initially obtained. Multidetector CT imaging through the chest, abdomen and pelvis was performed using the standard protocol during bolus administration of intravenous contrast. Multiplanar reconstructed images and MIPs were obtained and reviewed to evaluate the vascular anatomy. CONTRAST:  68mL OMNIPAQUE IOHEXOL 350 MG/ML SOLN COMPARISON:  CT abdomen pelvis April 12, 2019. FINDINGS: CTA CHEST FINDINGS Cardiovascular: Preferential opacification of the thoracic aorta. No evidence of thoracic aortic aneurysm or dissection. Changes of prior CABG. Dense calcifications of the native coronary arteries. No pericardial effusion. Mediastinum/Nodes: No enlarged mediastinal, hilar, or axillary lymph nodes. Thyroid gland, trachea, and esophagus demonstrate no significant findings. Lungs/Pleura: Was ache attenuation of the lungs. Mild diffuse bronchial wall thickening. There are few scattered 3 mm or smaller pulmonary nodules for instance a 3  mm subpleural nodule in the right lower lobe (series 7, image 65) and a 3 mm subpleural left lower lobe nodule (series 7, image 106). Musculoskeletal: Prior median sternotomy. No chest wall abnormality. No acute or significant osseous findings. Degenerative change of the thoracic spine and bilateral glenohumeral joints. Review of the MIP images confirms the above findings. CTA ABDOMEN AND PELVIS FINDINGS VASCULAR Aorta: Aortic atherosclerosis. Normal caliber aorta without aneurysm, dissection, vasculitis or significant stenosis. Celiac: Patent without evidence of aneurysm, dissection, vasculitis or significant stenosis. SMA: Patent without evidence of aneurysm, dissection, vasculitis or significant stenosis. Renals: Calcified noncalcified plaque at the renal ostia with mild stenosis on the right and moderate stenosis on the left. IMA: Patent without evidence of aneurysm, dissection, vasculitis or significant stenosis. Inflow: Patent without evidence of aneurysm, dissection, vasculitis or significant stenosis. Veins: No obvious venous abnormality within the limitations of this arterial phase study. Review of the MIP images confirms the above findings. NON-VASCULAR Hepatobiliary: Cyst in the left lobe of the liver. No suspicious arterially hepatic lesions. No gallstones, gallbladder wall thickening, or biliary dilatation. Pancreas: Unremarkable. No pancreatic ductal dilatation or surrounding inflammatory changes. Spleen: Normal in size without focal abnormality. Adrenals/Urinary Tract: Adrenal glands are unremarkable. Right lower pole  cortical scarring similar to prior. No hydronephrosis. Bladder is obscured by streak artifact from bilateral hip prostheses. Stomach/Bowel: Stomach is within normal limits. Appendix is absent. No evidence of bowel wall thickening, distention, or inflammatory changes, on this study which is partially obscured in the pelvis by streak artifact from bilateral hip prostheses. Lymphatic: No  pathologically enlarged abdominal or pelvic lymph nodes. Reproductive: Uterus and bilateral adnexa are unremarkable. Other: No abdominal wall hernia or abnormality. Left inguinal surgical clips. No abdominopelvic ascites. Musculoskeletal: Bilateral total hip arthroplasties. Degenerative change of the thoracolumbar spine. No acute osseous abnormality. Review of the MIP images confirms the above findings. IMPRESSION: 1. No evidence of aortic aneurysm or dissection. 2. Mild diffuse bronchial wall thickening with mosaic attenuation of the lungs, suggestive of bronchitis. 3. There are few scattered 3 mm or smaller bilateral pulmonary nodules. Which per Fleischner society guidelines no follow-up is recommended in low risk patients. However, an optional follow-up dedicated chest CT in 12 months could be obtained in high risk patients. 4. Moderate stenosis of the left renal artery and mild stenosis of the right renal artery at the ostia. 5. Aortic atherosclerosis. Aortic Atherosclerosis (ICD10-I70.0). Electronically Signed   By: Dahlia Bailiff MD   On: 04/15/2020 18:10    Scheduled Meds: . dorzolamide-timolol  1 drop Both Eyes BID  . ezetimibe  10 mg Oral Daily  . gabapentin  300 mg Oral QID  . insulin aspart  0-9 Units Subcutaneous Q4H  . linaclotide  72 mcg Oral QAC breakfast  . montelukast  10 mg Oral QHS  . pantoprazole  40 mg Oral Daily  . ranolazine  500 mg Oral BID  . rosuvastatin  20 mg Oral Daily   Continuous Infusions: . sodium chloride 50 mL/hr at 04/16/20 0212   PRN Meds:.acetaminophen **OR** acetaminophen, albuterol, famotidine, fentaNYL (SUBLIMAZE) injection, HYDROcodone-acetaminophen, temazepam   Cardiovascular & other pertient studies:  EKG 04/15/2020: Sinus rhythm LAFB Lateral T wave inversion, likely LV strain pattern Frequent PVC's-new since previous EKG No other significant change noted  Scheduled Remote ICD check  01/19/2020: There were 0 VF episodes and 0 nonsustained  episodes of ventricular tachycardia. Health trends (patient activity, heart rate variability, average heart rates) are stable. Trans-thoracic impedance do not present significant abnormalities since last follow-up on 10/20/19. Battery longevity is 11.1 years. RV pacing is 0.0 %.  Echocardiogram 10/03/2019:  Left ventricle cavity is normal in size. Mild concentric hypertrophy of  the left ventricle. Severe global hypokinesis. LVEF 25-30%.  Left atrial cavity is mildly dilated.  Trileaflet aortic valve with no regurgitation. Mildly restricted aortic  valve leaflets.  Moderate (Grade II) mitral regurgitation.  Moderate tricuspid regurgitation. Estimated pulmonary artery systolic  pressure is 54 mmHg.  Compared to previous study on 03/25/2019, pulmonary hypertension is new.   [place] office ICD05/30/21Medtronic VISIA MRI VR [id number] 04/21/2019 Single (S)/Dual (D)/BV (M)S PresentingNSR Pacer dependant:No. UnderlyingNSR. APNA%, VP<0.1%. AMS Episodes0.  HVR0.  Longevity11.3Years/Voltage. Charge time 3.8Sec.  Observations:Normal single chamber ICD function. Changes:None EKG 08/23/2019: Sinus rhythm 77 bpm Left atrial enlargement Left anterior fascicular block Old inferior infarct  Carotid artery duplex 07/25/2019:  Stenosis in the right internal carotid artery (50-69%). Upper end of spectrum.  Stenosis in the left internal carotid artery (>=70%).  The left PSV internal/common carotid artery ratio of 4.88 is consistent with a stenosis of >70%.  Antegrade right vertebral artery flow. Antegrade left vertebral artery flow.  Follow up in six months is appropriate if clinically indicated. No significant change from 06/26/2018.  Echocardiogram 05/10/2018: Left ventricle cavity is normal in size. Severe decrease in global wall motion. Doppler evidence of grade II (pseudonormal) diastolic dysfunction, elevated LAP. LVEF 15-20%. Left atrial cavity is moderately  dilated. Moderate (Grade II) mitral regurgitation. Mildly restricted mitral valve leaflets. Moderate tricuspid regurgitation. Estimated pulmonary artery systolic pressure 35 mmHg. Mild pulmonic regurgitation. No significant change compared to previous study on 06/02/2017.  Coronary angiogram 11/25/2017: LM: 95% diffuse disease (Unchanged from prior cath in 06/2017) LAD: 100% ostially occluded (Unchanged from prior cath in 06/2017) LCx: 100% ostially occluded (Unchanged from prior cath in 06/2017) RCA: 100% known proximal occlusion (Not visualized today) LIMA-LAD: Patent with good distal flow SVG-RCA: Patent with atent ostial stent. No other significant stenosis. Distal RCA has moderate disease (Unchanged from prior cath in 06/2017). RCA to LCx collaterals seen   LVEDP 16-20 mmHg  CT angiogram neck 02/01/2018: 80% stenosis and left internal carotid artery with dense calcification 60% stenosis and right internal carotid artery with calcified and noncalcified plaque Scattered atherosclerotic calcifications involving vertebral arteries bilaterally without other significant stenoses  Incidental finding of grade 1 anterolisthesis C2-C3, C3-C4. 4 mm thyroid nodule, and coronary artery disease  Assessment & Recommendations:  80 y.o. African American female with coronary artery disease status post CABG 1997, PCI to SVG 06/23/17, ischemic cardiomyopathy EF 20%, hypertension, hyperlipidemia, bilateral stable moderate carotid stenoses, admitted with abdominal pain, chest pain.  Chest pain: Likely acute coronary syndrome.  No new ischemic changes on EKG.  Flat and negative high-sensitivity troponin. I do not think she needs any inpatient work-up for her chest pain at this time. Continue current antianginal therapy.  We will see her for outpatient follow-up and consider nuclear stress testing, suspicion for angina is high.  Defer management of lower extremity weakness to primary team.  MRI  spine is pending.  Coronary artery disease involving coronary bypass graft of native heart without angina pectoris Stable. No recurrent chest pain. Severe native vessel disease with occluded native arteries and ostial or proximal areas. Patent LIMA-LAD, SVG-RCA grafts with RCA to left circumflex collaterals (11/2017). Diffuse distal RCA artery disease. Continue plavix for CAD and PAD, statin, beta blocker.   Ischemic cardiomyopathy HFrEF NYHA class II symptoms. LVEF 25% Continue Entresto to 97-103 mg bid, spironolactone 50 mg daily, metoprolol succinate 50 mg daily, lasix as needed. Now s/p ICD placement (04/2019).     [name], MD Pager: [phone] Office: [phone]

## 2020-04-17 ENCOUNTER — Observation Stay (HOSPITAL_COMMUNITY): Payer: Medicare HMO

## 2020-04-17 DIAGNOSIS — R531 Weakness: Secondary | ICD-10-CM | POA: Diagnosis not present

## 2020-04-17 DIAGNOSIS — R29898 Other symptoms and signs involving the musculoskeletal system: Secondary | ICD-10-CM | POA: Diagnosis not present

## 2020-04-17 DIAGNOSIS — I5022 Chronic systolic (congestive) heart failure: Secondary | ICD-10-CM

## 2020-04-17 DIAGNOSIS — R296 Repeated falls: Secondary | ICD-10-CM | POA: Diagnosis not present

## 2020-04-17 DIAGNOSIS — M25551 Pain in right hip: Secondary | ICD-10-CM | POA: Diagnosis not present

## 2020-04-17 DIAGNOSIS — Z96642 Presence of left artificial hip joint: Secondary | ICD-10-CM | POA: Diagnosis not present

## 2020-04-17 LAB — GLUCOSE, CAPILLARY
Glucose-Capillary: 105 mg/dL — ABNORMAL HIGH (ref 70–99)
Glucose-Capillary: 105 mg/dL — ABNORMAL HIGH (ref 70–99)
Glucose-Capillary: 124 mg/dL — ABNORMAL HIGH (ref 70–99)
Glucose-Capillary: 93 mg/dL (ref 70–99)

## 2020-04-17 LAB — CBC
HCT: 32.4 % — ABNORMAL LOW (ref 36.0–46.0)
Hemoglobin: 10.8 g/dL — ABNORMAL LOW (ref 12.0–15.0)
MCH: 29.8 pg (ref 26.0–34.0)
MCHC: 33.3 g/dL (ref 30.0–36.0)
MCV: 89.3 fL (ref 80.0–100.0)
Platelets: 318 10*3/uL (ref 150–400)
RBC: 3.63 MIL/uL — ABNORMAL LOW (ref 3.87–5.11)
RDW: 13.7 % (ref 11.5–15.5)
WBC: 5.1 10*3/uL (ref 4.0–10.5)
nRBC: 0 % (ref 0.0–0.2)

## 2020-04-17 LAB — URINE CULTURE

## 2020-04-17 LAB — BASIC METABOLIC PANEL
Anion gap: 11 (ref 5–15)
BUN: 9 mg/dL (ref 8–23)
CO2: 21 mmol/L — ABNORMAL LOW (ref 22–32)
Calcium: 8.8 mg/dL — ABNORMAL LOW (ref 8.9–10.3)
Chloride: 99 mmol/L (ref 98–111)
Creatinine, Ser: 0.88 mg/dL (ref 0.44–1.00)
GFR, Estimated: >60 mL/min (ref >60)
Glucose, Bld: 109 mg/dL — ABNORMAL HIGH (ref 70–99)
Potassium: 4.2 mmol/L (ref 3.5–5.1)
Sodium: 131 mmol/L — ABNORMAL LOW (ref 135–145)

## 2020-04-17 LAB — VITAMIN B12: Vitamin B-12: 466 pg/mL (ref 180–914)

## 2020-04-17 NOTE — Progress Notes (Signed)
Physical Therapy Treatment Patient Details Name: Lynn Carey MRN: [id number] DOB: January 29, 1941 Today's Date: 04/17/2020    History of Present Illness 80 y.o. female with medical history significant of asthma, CHF, CAD, DM, HTN history of carotid artery occlusion hx of pacemaker. Presented with weakness in the legs tingling in hands and feet slurred speech. MRI pending    PT Comments    Patient received in bed, very pleasant and cooperative with therapy today. Demonstrated great improvement in mobility today- able to mobilize mostly on a min guard basis but did need Mod assist for standing with the RW. Tolerated gait about 77f in the hallway before fatiguing. Left up in recliner with all needs met, chair alarm active. Did much better overall today- at this point could likely go home with HHPT and 24/7A from family.     Follow Up Recommendations  Home health PT;Supervision/Assistance - 24 hour     Equipment Recommendations  Rolling walker with 5" wheels;3in1 (PT)    Recommendations for Other Services       Precautions / Restrictions Precautions Precautions: Fall Restrictions Weight Bearing Restrictions: No    Mobility  Bed Mobility Overal bed mobility: Needs Assistance Bed Mobility: Supine to Sit     Supine to sit: Min guard;HOB elevated     General bed mobility comments: min guard assist for safety, no physical assist given; HOB elevated and needed extended time  Transfers Overall transfer level: Needs assistance Equipment used: Rolling walker (2 wheeled) Transfers: Sit to/from Stand Sit to Stand: Mod assist         General transfer comment: able to boost up to standing with ModA and cues for hand placement today, needed a beat of min guard-minA to get balance once standing with RW  Ambulation/Gait Ambulation/Gait assistance: Min guard Gait Distance (Feet): 80 Feet Assistive device: Rolling walker (2 wheeled) Gait Pattern/deviations: Step-through  pattern;Decreased step length - right;Decreased step length - left;Decreased stride length;Trunk flexed Gait velocity: decreased   General Gait Details: slow but steady with RW, still wtih reduced awareness of placement of feet but no ataxia noted. Slight inattention to obstacles in hallway and needed cues to avoid them. Easily fatigued.   Stairs             Wheelchair Mobility    Modified Rankin (Stroke Patients Only)       Balance Overall balance assessment: History of Falls;Needs assistance Sitting-balance support: No upper extremity supported Sitting balance-Leahy Scale: Good     Standing balance support: Bilateral upper extremity supported Standing balance-Leahy Scale: Poor Standing balance comment: Reliant on UE and external support                            Cognition Arousal/Alertness: Awake/alert Behavior During Therapy: WFL for tasks assessed/performed Overall Cognitive Status: No family/caregiver present to determine baseline cognitive functioning                                 General Comments: most likely at basline      Exercises      General Comments        Pertinent Vitals/Pain Pain Assessment: 0-10 Pain Score: 2  Pain Location: BLEs Pain Descriptors / Indicators: Aching;Sore Pain Intervention(s): Limited activity within patient's tolerance    Home Living  Prior Function            PT Goals (current goals can now be found in the care plan section) Acute Rehab PT Goals Patient Stated Goal: to get stronger PT Goal Formulation: With patient Time For Goal Achievement: 04/30/20 Potential to Achieve Goals: Good Progress towards PT goals: Progressing toward goals    Frequency    Min 3X/week      PT Plan Discharge plan needs to be updated;Equipment recommendations need to be updated    Co-evaluation              AM-PAC PT "6 Clicks" Mobility   Outcome Measure  Help  needed turning from your back to your side while in a flat bed without using bedrails?: A Little Help needed moving from lying on your back to sitting on the side of a flat bed without using bedrails?: A Little Help needed moving to and from a bed to a chair (including a wheelchair)?: A Little Help needed standing up from a chair using your arms (e.g., wheelchair or bedside chair)?: A Lot Help needed to walk in hospital room?: A Little Help needed climbing 3-5 steps with a railing? : Total 6 Click Score: 15    End of Session Equipment Utilized During Treatment: Gait belt Activity Tolerance: Patient tolerated treatment well Patient left: in chair;with call bell/phone within reach;with chair alarm set Nurse Communication: Mobility status PT Visit Diagnosis: History of falling (Z91.81);Repeated falls (R29.6);Muscle weakness (generalized) (M62.81)     Time: 0093-8182 PT Time Calculation (min) (ACUTE ONLY): 18 min  Charges:  $Gait Training: 8-22 mins                     Windell Norfolk, DPT, PN1   Supplemental Physical Therapist [place]    Pager [phone] Acute Rehab Office [phone]

## 2020-04-17 NOTE — Discharge Summary (Signed)
Physician Discharge Summary  Lynn Carey YPP:[id number] DOB: 1940/12/07 DOA: 04/15/2020  PCP: Glendale Chard, MD  Admit date: 04/15/2020 Discharge date: 04/17/2020  Admitted From: home Disposition:  hh  Recommendations for Outpatient Follow-up:  Follow up with PCP in 1-2 weeks Please obtain BMP/CBC in one week Please follow up on the following pending results:  Home Health:yes  Equipment/Devices: na  Discharge Condition: Stable Code Status:   Code Status: Full Code Diet recommendation:  Diet Order             Diet - low sodium heart healthy           Diet Carb Modified Fluid consistency: Thin; Room service appropriate? Yes  Diet effective now                    Brief/Interim Summary:  80 year old female with history of asthma, CHF, CAD, DM, hypertension, carotid artery occlusion, history of pacemaker comes to the ER with multiple complaints tingling in hands and feet, slurred speech, generalized weakness, multiple falls about 5 in 6 days, recent ED visit 5 days prior to lower extremities for constipation. As per the report for past week she has had trouble ambulating at baseline she is able to walk on her own. She finally was recommended to come to emergency department today again for new onset slurred speech Abdominal pain since have improved and she did have a bowel movement but now she has more of a back pain.Reports she can take a few steps and then her knees just give out She has not been eating well her mouth is dry, Decreased appetite. When she stands up her legs give way.  The weakness started since Wednesday family had to hold her off She did have an episode of chest pain 2 days ago but now resolved.  Described more like tightness and knot sensation in her chest no associated shortness of breath. She took some Renexa and it helped On arrival to emergency department blood pressure 92/70 although on repeat was up to 126/70 CBG 163 satting 98 room air heart rate was 86  with frequent PVCs In th ED COVID-negative,CT head no acute finding, chest x-ray with atelectasis CT angio chest abdomen pelvis-mild diffuse bronchial wall thickening suggestive of bronchitis, bilateral pulmonary nodules follow-up and CT in 2 months advised, moderate stenosis of the left renal artery and mild stenosis of the right renal artery, CT chest no PE. Neurology was consulted by ED advised MRI brain and spine, patient was admitted for bilateral leg weakness, hyponatremia, dehydration. Work-up unremarkable with MRI brain and MRI lumbar spine.  She has history of bilateral hip replacement prosthesis remains a stable on x-ray.Seen by PT OT suspect fall and presentation from dehydration deconditioning hypotension.  Amlodipine Lasix and BiDil remains on hold blood pressure stable.  Follow-up with primary cardiologist to resume diuretics soon, continue Aldactone Entresto Toprol.  Discharge Diagnoses:   B/L leg weakness/FALLs at home:Along with slurred speech, multiple falls at home- neurology recommended MRI brain and spine and was unremarkable no evidence of acute stroke with no acute finding.  Patient did have hypotension and presentation suspect could be the etiology.  We have discontinued her multiple medications blood pressure currently remained stable on current regimen and will be she will be discharged home.  Instructed to monitor blood pressure at home and follow-up with her PCP or primary cardiologist.  Continue PT OT initially CIR referred but not a candidate   Dehydration: Likely in the setting of  diuretic use.  Volume status improved tolerating p.o. well. Hypokalemia repleted Hyponatremia:  Improved with fluids.   AKI creatinine improved 1.4-1.1.  Holding off on diuretics for now Chest pain/CAD hx of CABG: Hx Trop normal.   Continue Plavix metoprolol, Crestor, Ranexa and Zetia.   Seen by cardiology advised outpatient follow-up   Chronic systolic heart failure : Compensated euvolemic  patient got gentle hydration for dehydration.  Diuretics remains on hold - f/u with cardio, cont home  Entresto, aldactone . Cont to hold  amlodipine, bidil ginen hypotension and dehydration.  follow-up with cardiology to resume diuretics in 1 week-This was discussed with her daughter as well. ICD Single chamber [employer] VISIA MRI VR [id number] 04/21/2019:s/p MRI  HTN/Mixed hyperlipidemia/Carotid artery stenosis: Hypotensive on admission, blood pressure is stable cont meds as above.    Asthma chronic, stable.  Continue Singulair as needed inhaler   DM W/ Neuropathy, last hemoglobin A1c 5.6.  Blood sugar well controlled continue home regimen   Consults: PTOT  Subjective: AAOX3, FEELS WELL TODAY, PAIN CONTROLLED. Okay w/ going home. Did well with pt Discharge Exam: Vitals:   04/17/20 0544 04/17/20 1030  BP: 128/76 114/71  Pulse: 86 78  Resp: 18 14  Temp: 98.5 F (36.9 C) 97.8 F (36.6 C)  SpO2: 98% 98%   General: Pt is alert, awake, not in acute distress Cardiovascular: RRR, S1/S2 +, no rubs, no gallops Respiratory: CTA bilaterally, no wheezing, no rhonchi Abdominal: Soft, NT, ND, bowel sounds + Extremities: no edema, no cyanosis  Discharge Instructions  Discharge Instructions     (HEART FAILURE PATIENTS) Call MD:  Anytime you have any of the following symptoms: 1) 3 pound weight gain in 24 hours or 5 pounds in 1 week 2) shortness of breath, with or without a dry hacking cough 3) swelling in the hands, feet or stomach 4) if you have to sleep on extra pillows at night in order to breathe.   Complete by: As directed    Diet - low sodium heart healthy   Complete by: As directed    Discharge instructions   Complete by: As directed    Please call call MD or return to ER for similar or worsening recurring problem that brought you to hospital or if any fever,nausea/vomiting,abdominal pain, uncontrolled pain, chest pain,  shortness of breath or any other alarming symptoms.  PLEASE  CHECK BLOOD PRESSURE DAILY AT HOME AND HOLD YOUR MEDICATION IF BP < 100 AND TALK TO YOUR HEART DOCTOR REGARDING YOUR MEDICATIONS  Please follow-up your doctor as instructed in a week time and call the office for appointment.  Please avoid alcohol, smoking, or any other illicit substance and maintain healthy habits including taking your regular medications as prescribed.  You were cared for by a hospitalist during your hospital stay. If you have any questions about your discharge medications or the care you received while you were in the hospital after you are discharged, you can call the unit and ask to speak with the hospitalist on call if the hospitalist that took care of you is not available.  Once you are discharged, your primary care physician will handle any further medical issues. Please note that NO REFILLS for any discharge medications will be authorized once you are discharged, as it is imperative that you return to your primary care physician (or establish a relationship with a primary care physician if you do not have one) for your aftercare needs so that they can reassess your need for medications  and monitor your lab values   Increase activity slowly   Complete by: As directed       Allergies as of 04/17/2020       Reactions   Lisinopril Cough        Medication List     STOP taking these medications    amLODipine 2.5 MG tablet Commonly known as: NORVASC   BiDil 20-37.5 MG tablet Generic drug: isosorbide-hydrALAZINE   doxycycline 100 MG capsule Commonly known as: VIBRAMYCIN   furosemide 40 MG tablet Commonly known as: LASIX       TAKE these medications    acetaminophen 650 MG CR tablet Commonly known as: TYLENOL Take 650 mg by mouth every 8 (eight) hours as needed for pain.   albuterol 108 (90 Base) MCG/ACT inhaler Commonly known as: VENTOLIN HFA Inhale 2 puffs into the lungs every 4 (four) hours as needed for wheezing or shortness of breath.    clopidogrel 75 MG tablet Commonly known as: PLAVIX TAKE 1 TABLET EVERY DAY What changed: when to take this   dorzolamide-timolol 22.3-6.8 MG/ML ophthalmic solution Commonly known as: COSOPT Place 1 drop into both eyes 2 (two) times daily.   ezetimibe 10 MG tablet Commonly known as: ZETIA TAKE 1 TABLET EVERY DAY   famotidine 20 MG tablet Commonly known as: PEPCID Take 20 mg by mouth at bedtime as needed for heartburn or indigestion.   gabapentin 300 MG capsule Commonly known as: Neurontin Take 1 capsule (300 mg total) by mouth 4 (four) times daily.   linaclotide 72 MCG capsule Commonly known as: Linzess Take 1 capsule (72 mcg total) by mouth daily before breakfast.   metoprolol succinate 50 MG 24 hr tablet Commonly known as: TOPROL-XL Take 1 tablet (50 mg total) by mouth daily. Take with or immediately following a meal.   montelukast 10 MG tablet Commonly known as: SINGULAIR Take 1 tablet (10 mg total) by mouth at bedtime.   nitroGLYCERIN 0.4 MG SL tablet Commonly known as: NITROSTAT Place 0.4 mg under the tongue every 5 (five) minutes as needed for chest pain.   pantoprazole 40 MG tablet Commonly known as: PROTONIX TAKE 1 TABLET EVERY DAY   ranolazine 500 MG 12 hr tablet Commonly known as: RANEXA Take 2 tablets (1,000 mg total) by mouth 2 (two) times daily. What changed: how much to take   rosuvastatin 40 MG tablet Commonly known as: CRESTOR TAKE 1/2 TABLET EVERY DAY   sacubitril-valsartan 97-103 MG Commonly known as: ENTRESTO Take 1 tablet by mouth 2 (two) times daily.   sitaGLIPtin 50 MG tablet Commonly known as: Januvia Take 1 tablet (50 mg total) by mouth daily. What changed: when to take this   spironolactone 50 MG tablet Commonly known as: ALDACTONE TAKE 1 TABLET BY MOUTH EVERY DAY   temazepam 15 MG capsule Commonly known as: RESTORIL Take 15 mg by mouth at bedtime as needed for sleep.   Vitamin D 50 MCG (2000 UT) Caps Take 2,000 Units by  mouth daily.        Follow-up Information     Glendale Chard, MD Follow up in 1 week(s).   Specialty: Internal Medicine Contact information: [address] [place] [address] [phone]                Allergies  Allergen Reactions   Lisinopril Cough    The results of significant diagnostics from this hospitalization (including imaging, microbiology, ancillary and laboratory) are listed below for reference.  Microbiology: Recent Results (from the past 240 hour(s))  Urine culture     Status: Abnormal   Collection Time: 04/10/20  8:19 PM   Specimen: Urine, Clean Catch  Result Value Ref Range Status   Specimen Description URINE, CLEAN CATCH  Final   Special Requests Normal  Final   Culture (A)  Final    <10,000 COLONIES/mL INSIGNIFICANT GROWTH Performed at [facility], [address] [place], [place] [address]    Report Status 04/13/2020 FINAL  Final  Urine culture     Status: None   Collection Time: 04/11/20  1:44 AM   Specimen: Urine, Random  Result Value Ref Range Status   Specimen Description   Final    URINE, RANDOM Performed at [facility], [address] [place], [place] [address]    Special Requests   Final    NONE Performed at [facility], [address] [place], [place] [address]    Culture   Final    NO GROWTH Performed at [facility], [place] [address] [place], [place] [address]    Report Status 04/12/2020 FINAL  Final  Resp Panel by RT-PCR (Flu A&B, Covid) Nasopharyngeal Swab     Status: None   Collection Time: 04/15/20  2:24 PM   Specimen: Nasopharyngeal Swab; Nasopharyngeal(NP) swabs in vial transport medium  Result Value Ref Range Status   SARS Coronavirus 2 by RT PCR NEGATIVE NEGATIVE Final    Comment: (NOTE) SARS-CoV-2 target nucleic acids are NOT DETECTED.  The SARS-CoV-2 RNA is generally detectable in upper respiratory specimens during the acute phase of infection. The  lowest concentration of SARS-CoV-2 viral copies this assay can detect is 138 copies/mL. A negative result does not preclude SARS-Cov-2 infection and should not be used as the sole basis for treatment or other patient management decisions. A negative result may occur with  improper specimen collection/handling, submission of specimen other than nasopharyngeal swab, presence of viral mutation(s) within the areas targeted by this assay, and inadequate number of viral copies(<138 copies/mL). A negative result must be combined with clinical observations, patient history, and epidemiological information. The expected result is Negative.  Fact Sheet for Patients:  EntrepreneurPulse.com.au  Fact Sheet for Healthcare Providers:  IncredibleEmployment.be  This test is no t yet approved or cleared by the [place] FDA and  has been authorized for detection and/or diagnosis of SARS-CoV-2 by FDA under an Emergency Use Authorization (EUA). This EUA will remain  in effect (meaning this test can be used) for the duration of the COVID-19 declaration under Section 564(b)(1) of the Act, 21 U.S.C.section 360bbb-3(b)(1), unless the authorization is terminated  or revoked sooner.       Influenza A by PCR NEGATIVE NEGATIVE Final   Influenza B by PCR NEGATIVE NEGATIVE Final    Comment: (NOTE) The Xpert Xpress SARS-CoV-2/FLU/RSV plus assay is intended as an aid in the diagnosis of influenza from Nasopharyngeal swab specimens and should not be used as a sole basis for treatment. Nasal washings and aspirates are unacceptable for Xpert Xpress SARS-CoV-2/FLU/RSV testing.  Fact Sheet for Patients: EntrepreneurPulse.com.au  Fact Sheet for Healthcare Providers: IncredibleEmployment.be  This test is not yet approved or cleared by the [place] FDA and has been authorized for detection and/or diagnosis of SARS-CoV-2 by FDA under  an Emergency Use Authorization (EUA). This EUA will remain in effect (meaning this test can be used) for the duration of the COVID-19 declaration under Section 564(b)(1)  of the Act, 21 U.S.C. section 360bbb-3(b)(1), unless the authorization is terminated or revoked.  Performed at [facility], [place] [address] [place], [place] [address]   Urine culture     Status: Abnormal   Collection Time: 04/15/20  4:00 PM   Specimen: Urine, Random  Result Value Ref Range Status   Specimen Description URINE, RANDOM  Final   Special Requests   Final    NONE Performed at [facility], [place] [address] [place], [place] [address]    Culture MULTIPLE SPECIES PRESENT, SUGGEST RECOLLECTION (A)  Final   Report Status 04/17/2020 FINAL  Final    Procedures/Studies: CT HEAD WO CONTRAST  Result Date: 04/15/2020 CLINICAL DATA:  Mental status change. Weakness and multiple falls. Slurred speech last night. EXAM: CT HEAD WITHOUT CONTRAST TECHNIQUE: Contiguous axial images were obtained from the base of the skull through the vertex without intravenous contrast. COMPARISON:  10/25/2019 FINDINGS: Brain: Stable degree of atrophy and chronic small vessel ischemia. Remote right parietal infarct. No intracranial hemorrhage, mass effect, or midline shift. Partially empty sella. No hydrocephalus. The basilar cisterns are patent. Basal gangliar mineralization is unchanged, typically senescent. No evidence of territorial infarct or acute ischemia. No extra-axial or intracranial fluid collection. Vascular: Atherosclerosis of skullbase vasculature without hyperdense vessel or abnormal calcification. Skull: No fracture. Slight thickening of the outer table of the right frontal bone is likely an incidental osteoma. Sinuses/Orbits: Paranasal sinuses and mastoid air cells are clear. The visualized orbits are unremarkable. Other: No large scalp hematoma. IMPRESSION: 1. No acute intracranial abnormality. 2. Stable atrophy, chronic  small vessel ischemia, and remote right parietal infarct. Electronically Signed   By: Keith Rake M.D.   On: 04/15/2020 17:54   MR BRAIN WO CONTRAST  Result Date: 04/16/2020 CLINICAL DATA:  Stroke, follow-up EXAM: MRI HEAD WITHOUT CONTRAST TECHNIQUE: Multiplanar, multiecho pulse sequences of the brain and surrounding structures were obtained without intravenous contrast. COMPARISON:  None. FINDINGS: Brain: There is no acute infarction or intracranial hemorrhage. There is no intracranial mass, mass effect, or edema. There is no hydrocephalus or extra-axial fluid collection. Prominence of the ventricles and sulci reflects minor generalized parenchymal volume loss. Chronic right parietal infarct. Additional patchy and confluent areas of T2 hyperintensity in the supratentorial white matter are nonspecific but may reflect mild to moderate chronic microvascular ischemic changes. Punctate focus of susceptibility superior right thalamus is most compatible with a chronic microhemorrhage. Vascular: Major vessel flow voids at the skull base are preserved. Skull and upper cervical spine: Normal marrow signal is preserved. Sinuses/Orbits: Minor mucosal thickening. Bilateral lens replacements. Other: Partially empty sella.  Mastoid air cells are clear. IMPRESSION: No evidence of recent infarction, hemorrhage, or mass. Chronic right parietal infarct. Mild to moderate chronic microvascular ischemic changes. Electronically Signed   By: Macy Mis M.D.   On: 04/16/2020 15:17   MR Lumbar Spine W Wo Contrast  Result Date: 04/16/2020 CLINICAL DATA:  Low back pain.  Progressive neurologic deficit EXAM: MRI LUMBAR SPINE WITHOUT AND WITH CONTRAST TECHNIQUE: Multiplanar and multiecho pulse sequences of the lumbar spine were obtained without and with intravenous contrast. CONTRAST:  6.62mL GADAVIST GADOBUTROL 1 MMOL/ML IV SOLN COMPARISON:  Lumbar spine radiographs 10/09/2008. CT abdomen pelvis 04/15/2020 FINDINGS:  Segmentation:  Normal Alignment: 3 mm anterolisthesis L4-5 and L5-S1. Mild retrolisthesis L2-3. Vertebrae: Negative for fracture or mass. Prominent hemangioma L3 vertebral body on the left. Small hemangioma L4. Conus medullaris and cauda equina: Conus extends to the L2-3 level. Conus  and cauda equina appear normal. Paraspinal and other soft tissues: Negative for paraspinous mass or adenopathy. Disc levels: L1-2: Mild facet degeneration.  Negative for stenosis L2-3: Mild retrolisthesis. Mild disc degeneration and mild facet degeneration. Mild to moderate subarticular and foraminal stenosis bilaterally right greater than left. L3-4: Mild disc degeneration and disc bulging. Moderate facet degeneration bilaterally. Mild subarticular and foraminal stenosis bilaterally L4-5: Disc degeneration with diffuse disc bulging. Moderate to advanced facet degeneration bilaterally. Moderate subarticular and foraminal stenosis bilaterally. L5-S1: Mild disc bulging. Advanced facet degeneration. Moderate subarticular and foraminal stenosis bilaterally. Mild impingement of the L5 nerve root bilaterally. IMPRESSION: Multilevel degenerative change throughout the lumbar spine causing multilevel subarticular and foraminal stenosis bilaterally. No focal disc protrusion Negative for fracture. Electronically Signed   By: Franchot Gallo M.D.   On: 04/16/2020 15:38   CT ABDOMEN PELVIS W CONTRAST  Result Date: 04/11/2020 CLINICAL DATA:  Lower abdominal pain EXAM: CT ABDOMEN AND PELVIS WITH CONTRAST TECHNIQUE: Multidetector CT imaging of the abdomen and pelvis was performed using the standard protocol following bolus administration of intravenous contrast. CONTRAST:  28mL OMNIPAQUE IOHEXOL 300 MG/ML  SOLN COMPARISON:  None. FINDINGS: Lower chest: Visualized lung bases are clear bilaterally. Pacemaker leads are seen within the right heart. Hepatobiliary: Simple cyst within the left hepatic lobe. The liver is otherwise unremarkable. Gallbladder  unremarkable. No intra or extrahepatic biliary ductal dilation. Pancreas: Unremarkable. No pancreatic ductal dilatation or surrounding inflammatory changes. Spleen: Unremarkable Adrenals/Urinary Tract: The adrenal glands are unremarkable. Minimal cortical scarring is seen within the lower pole of the right kidney. The kidneys are otherwise unremarkable. The bladder is obscured by streak artifact from bilateral hip prostheses. Stomach/Bowel: Streak artifact limits evaluation of the pelvis. The stomach, small bowel, and large bowel are unremarkable and there is no evidence of obstruction or focal inflammation within the visualized segment. No free intraperitoneal gas or significant free fluid. The appendix is absent. Vascular/Lymphatic: Extensive aortoiliac atherosclerotic calcification. No aneurysm. No pathologic adenopathy within the abdomen and pelvis. Reproductive: Uterus and bilateral adnexa are unremarkable. Other: Rectum unremarkable.  Tiny fat containing umbilical hernia. Musculoskeletal: Bilateral total hip arthroplasty has been performed. Degenerative changes are seen within the lumbar spine. No acute bone abnormality. No lytic or blastic bone lesion. IMPRESSION: Limited evaluation of the pelvis due to streak artifact. No acute intra-abdominal pathology identified. No definite radiographic explanation for the patient's reported symptoms. Aortic Atherosclerosis (ICD10-I70.0). Electronically Signed   By: Fidela Salisbury MD   On: 04/11/2020 00:53   DG Chest Portable 1 View  Result Date: 04/15/2020 CLINICAL DATA:  Weakness. EXAM: PORTABLE CHEST 1 VIEW COMPARISON:  January 19, 2020. FINDINGS: Stable cardiomegaly. Sternotomy wires are noted. Left-sided pacemaker is unchanged in position. No pneumothorax or pleural effusion is noted. Left midlung subsegmental atelectasis or scarring is noted. Right lung is clear. Bony thorax is unremarkable. IMPRESSION: Left midlung subsegmental atelectasis or scarring.  Electronically Signed   By: Marijo Conception M.D.   On: 04/15/2020 14:27   CT Angio Chest/Abd/Pel for Dissection W and/or Wo Contrast  Result Date: 04/15/2020 CLINICAL DATA:  Abdominal pain aortic dissection suspected. EXAM: CT ANGIOGRAPHY CHEST, ABDOMEN AND PELVIS TECHNIQUE: Non-contrast CT of the chest was initially obtained. Multidetector CT imaging through the chest, abdomen and pelvis was performed using the standard protocol during bolus administration of intravenous contrast. Multiplanar reconstructed images and MIPs were obtained and reviewed to evaluate the vascular anatomy. CONTRAST:  24mL OMNIPAQUE IOHEXOL 350 MG/ML SOLN COMPARISON:  CT abdomen pelvis April 12, 2019. FINDINGS: CTA CHEST FINDINGS Cardiovascular: Preferential opacification of the thoracic aorta. No evidence of thoracic aortic aneurysm or dissection. Changes of prior CABG. Dense calcifications of the native coronary arteries. No pericardial effusion. Mediastinum/Nodes: No enlarged mediastinal, hilar, or axillary lymph nodes. Thyroid gland, trachea, and esophagus demonstrate no significant findings. Lungs/Pleura: Was ache attenuation of the lungs. Mild diffuse bronchial wall thickening. There are few scattered 3 mm or smaller pulmonary nodules for instance a 3 mm subpleural nodule in the right lower lobe (series 7, image 65) and a 3 mm subpleural left lower lobe nodule (series 7, image 106). Musculoskeletal: Prior median sternotomy. No chest wall abnormality. No acute or significant osseous findings. Degenerative change of the thoracic spine and bilateral glenohumeral joints. Review of the MIP images confirms the above findings. CTA ABDOMEN AND PELVIS FINDINGS VASCULAR Aorta: Aortic atherosclerosis. Normal caliber aorta without aneurysm, dissection, vasculitis or significant stenosis. Celiac: Patent without evidence of aneurysm, dissection, vasculitis or significant stenosis. SMA: Patent without evidence of aneurysm, dissection,  vasculitis or significant stenosis. Renals: Calcified noncalcified plaque at the renal ostia with mild stenosis on the right and moderate stenosis on the left. IMA: Patent without evidence of aneurysm, dissection, vasculitis or significant stenosis. Inflow: Patent without evidence of aneurysm, dissection, vasculitis or significant stenosis. Veins: No obvious venous abnormality within the limitations of this arterial phase study. Review of the MIP images confirms the above findings. NON-VASCULAR Hepatobiliary: Cyst in the left lobe of the liver. No suspicious arterially hepatic lesions. No gallstones, gallbladder wall thickening, or biliary dilatation. Pancreas: Unremarkable. No pancreatic ductal dilatation or surrounding inflammatory changes. Spleen: Normal in size without focal abnormality. Adrenals/Urinary Tract: Adrenal glands are unremarkable. Right lower pole cortical scarring similar to prior. No hydronephrosis. Bladder is obscured by streak artifact from bilateral hip prostheses. Stomach/Bowel: Stomach is within normal limits. Appendix is absent. No evidence of bowel wall thickening, distention, or inflammatory changes, on this study which is partially obscured in the pelvis by streak artifact from bilateral hip prostheses. Lymphatic: No pathologically enlarged abdominal or pelvic lymph nodes. Reproductive: Uterus and bilateral adnexa are unremarkable. Other: No abdominal wall hernia or abnormality. Left inguinal surgical clips. No abdominopelvic ascites. Musculoskeletal: Bilateral total hip arthroplasties. Degenerative change of the thoracolumbar spine. No acute osseous abnormality. Review of the MIP images confirms the above findings. IMPRESSION: 1. No evidence of aortic aneurysm or dissection. 2. Mild diffuse bronchial wall thickening with mosaic attenuation of the lungs, suggestive of bronchitis. 3. There are few scattered 3 mm or smaller bilateral pulmonary nodules. Which per Fleischner society  guidelines no follow-up is recommended in low risk patients. However, an optional follow-up dedicated chest CT in 12 months could be obtained in high risk patients. 4. Moderate stenosis of the left renal artery and mild stenosis of the right renal artery at the ostia. 5. Aortic atherosclerosis. Aortic Atherosclerosis (ICD10-I70.0). Electronically Signed   By: Dahlia Bailiff MD   On: 04/15/2020 18:10   DG HIP UNILAT WITH PELVIS 2-3 VIEWS LEFT  Result Date: 04/17/2020 CLINICAL DATA:  History of multiple recent falls. EXAM: DG HIP (WITH OR WITHOUT PELVIS) 2-3V LEFT COMPARISON:  CT scan, same date. FINDINGS: Bilateral hip prostheses are normally located. No acute periprosthetic fracture is identified. The bony pelvis is intact. The pubic symphysis and SI joints are intact. IMPRESSION: Bilateral hip prostheses in good position without complicating features. Electronically Signed   By: Marijo Sanes M.D.   On: 04/17/2020 10:03   DG HIP UNILAT WITH PELVIS 2-3 VIEWS  RIGHT  Result Date: 04/17/2020 CLINICAL DATA:  80 year old female with falls and hip pain EXAM: DG HIP (WITH OR WITHOUT PELVIS) 2-3V RIGHT COMPARISON:  None. FINDINGS: Bony pelvic ring intact.  No acute displaced fracture.  Osteopenia. Atherosclerosis. Surgical changes of bilateral hip arthroplasty. Components are aligned. No perihardware fracture. No significant lucency. IMPRESSION: Negative for acute bony abnormality. Surgical changes of bilateral hip arthroplasty. Atherosclerosis Electronically Signed   By: Corrie Mckusick D.O.   On: 04/17/2020 10:00    Labs: BNP (last 3 results) Recent Labs    04/16/20 0500  BNP 825.0*   Basic Metabolic Panel: Recent Labs  Lab 04/10/20 2106 04/15/20 1424 04/16/20 0442 04/17/20 0250  NA 135 127* 131* 131*  K 3.7 4.7 3.1* 4.2  CL 96* 91* 96* 99  CO2 27 24 24  21*  GLUCOSE 134* 89 125* 109*  BUN 11 15 14 9   CREATININE 1.11* 1.43* 1.14* 0.88  CALCIUM 9.4 9.3 8.8* 8.8*  MG  --   --  1.7  --   PHOS   --   --  3.4  --    Liver Function Tests: Recent Labs  Lab 04/10/20 2106 04/15/20 1424 04/16/20 0442  AST 14* 48* 18  ALT 10 17 14   ALKPHOS 56 57 52  BILITOT 0.6 1.0 0.6  PROT 7.8 6.9 6.1*  ALBUMIN 4.0 3.7 3.3*   Recent Labs  Lab 04/10/20 2106  LIPASE 20   No results for input(s): AMMONIA in the last 168 hours. CBC: Recent Labs  Lab 04/10/20 2106 04/15/20 1424 04/16/20 0442 04/17/20 0250  WBC 5.8 5.4 6.0 5.1  NEUTROABS 3.3 3.2 3.7  --   HGB 12.3 13.0 11.7* 10.8*  HCT 38.2 41.1 35.3* 32.4*  MCV 90.5 88.6 87.8 89.3  PLT 405* 392 350 318   Cardiac Enzymes: Recent Labs  Lab 04/16/20 0500  CKTOTAL 96   BNP: Invalid input(s): POCBNP CBG: Recent Labs  Lab 04/16/20 2029 04/17/20 0003 04/17/20 0542 04/17/20 0851 04/17/20 1136  GLUCAP 138* 105* 105* 124* 93   D-Dimer No results for input(s): DDIMER in the last 72 hours. Hgb A1c Recent Labs    04/16/20 0500  HGBA1C 5.6   Lipid Profile No results for input(s): CHOL, HDL, LDLCALC, TRIG, CHOLHDL, LDLDIRECT in the last 72 hours. Thyroid function studies Recent Labs    04/16/20 0443  TSH 3.378   Anemia work up Recent Labs    04/17/20 0651  VITAMINB12 466   Urinalysis    Component Value Date/Time   COLORURINE YELLOW 04/15/2020 1600   APPEARANCEUR CLEAR 04/15/2020 1600   LABSPEC 1.010 04/15/2020 1600   PHURINE 6.0 04/15/2020 1600   GLUCOSEU NEGATIVE 04/15/2020 1600   HGBUR NEGATIVE 04/15/2020 1600   BILIRUBINUR NEGATIVE 04/15/2020 1600   BILIRUBINUR negative 04/10/2020 1950   BILIRUBINUR negative 07/26/2019 1014   KETONESUR NEGATIVE 04/15/2020 1600   PROTEINUR NEGATIVE 04/15/2020 1600   UROBILINOGEN 1.0 04/10/2020 1950   UROBILINOGEN 1.0 01/22/2014 1104   NITRITE NEGATIVE 04/15/2020 1600   LEUKOCYTESUR NEGATIVE 04/15/2020 1600   Sepsis Labs Invalid input(s): PROCALCITONIN,  WBC,  LACTICIDVEN Microbiology Recent Results (from the past 240 hour(s))  Urine culture     Status: Abnormal    Collection Time: 04/10/20  8:19 PM   Specimen: Urine, Clean Catch  Result Value Ref Range Status   Specimen Description URINE, CLEAN CATCH  Final   Special Requests Normal  Final   Culture (A)  Final    <10,000 COLONIES/mL INSIGNIFICANT GROWTH Performed  at [facility], [place] [address], [place], [place] [address]    Report Status 04/13/2020 FINAL  Final  Urine culture     Status: None   Collection Time: 04/11/20  1:44 AM   Specimen: Urine, Random  Result Value Ref Range Status   Specimen Description   Final    URINE, RANDOM Performed at [facility], [address] [place], [place] [address]    Special Requests   Final    NONE Performed at [facility], [address] [place], [place] [address]    Culture   Final    NO GROWTH Performed at [facility], [place] [address] [place], [place] [address]    Report Status 04/12/2020 FINAL  Final  Resp Panel by RT-PCR (Flu A&B, Covid) Nasopharyngeal Swab     Status: None   Collection Time: 04/15/20  2:24 PM   Specimen: Nasopharyngeal Swab; Nasopharyngeal(NP) swabs in vial transport medium  Result Value Ref Range Status   SARS Coronavirus 2 by RT PCR NEGATIVE NEGATIVE Final    Comment: (NOTE) SARS-CoV-2 target nucleic acids are NOT DETECTED.  The SARS-CoV-2 RNA is generally detectable in upper respiratory specimens during the acute phase of infection. The lowest concentration of SARS-CoV-2 viral copies this assay can detect is 138 copies/mL. A negative result does not preclude SARS-Cov-2 infection and should not be used as the sole basis for treatment or other patient management decisions. A negative result may occur with  improper specimen collection/handling, submission of specimen other than nasopharyngeal swab, presence of viral mutation(s) within the areas targeted by this assay, and inadequate number of viral copies(<138 copies/mL). A negative result must be combined with clinical observations,  patient history, and epidemiological information. The expected result is Negative.  Fact Sheet for Patients:  EntrepreneurPulse.com.au  Fact Sheet for Healthcare Providers:  IncredibleEmployment.be  This test is no t yet approved or cleared by the [place] FDA and  has been authorized for detection and/or diagnosis of SARS-CoV-2 by FDA under an Emergency Use Authorization (EUA). This EUA will remain  in effect (meaning this test can be used) for the duration of the COVID-19 declaration under Section 564(b)(1) of the Act, 21 U.S.C.section 360bbb-3(b)(1), unless the authorization is terminated  or revoked sooner.       Influenza A by PCR NEGATIVE NEGATIVE Final   Influenza B by PCR NEGATIVE NEGATIVE Final    Comment: (NOTE) The Xpert Xpress SARS-CoV-2/FLU/RSV plus assay is intended as an aid in the diagnosis of influenza from Nasopharyngeal swab specimens and should not be used as a sole basis for treatment. Nasal washings and aspirates are unacceptable for Xpert Xpress SARS-CoV-2/FLU/RSV testing.  Fact Sheet for Patients: EntrepreneurPulse.com.au  Fact Sheet for Healthcare Providers: IncredibleEmployment.be  This test is not yet approved or cleared by the [place] FDA and has been authorized for detection and/or diagnosis of SARS-CoV-2 by FDA under an Emergency Use Authorization (EUA). This EUA will remain in effect (meaning this test can be used) for the duration of the COVID-19 declaration under Section 564(b)(1) of the Act, 21 U.S.C. section 360bbb-3(b)(1), unless the authorization is terminated or revoked.  Performed at [facility], [place] [address] [place], [place] [address]   Urine culture     Status: Abnormal   Collection Time: 04/15/20  4:00 PM   Specimen: Urine, Random  Result Value Ref Range Status   Specimen Description URINE, RANDOM  Final   Special  Requests   Final     NONE Performed at [facility], [place] [address], [place], [place] [address]    Culture MULTIPLE SPECIES PRESENT, SUGGEST RECOLLECTION (A)  Final   Report Status 04/17/2020 FINAL  Final     Time coordinating discharge: 25 minutes  SIGNED: Antonieta Pert, MD  [facility] 04/17/2020, 2:03 PM  If 7PM-7AM, please contact night-coverage [email or web address]

## 2020-04-17 NOTE — Progress Notes (Signed)
Nutrition Brief Note  RD consulted for assessment of nutritional requirements/ status.   Wt Readings from Last 15 Encounters:  04/16/20 (P) 64.3 kg  04/10/20 65.8 kg  03/08/20 66.2 kg  11/30/19 66.2 kg  11/06/19 66.5 kg  10/18/19 67.1 kg  09/20/19 66.2 kg  08/30/19 65.3 kg  08/23/19 65.3 kg  07/26/19 66.1 kg  07/26/19 66.1 kg  06/20/19 65.8 kg  05/09/19 65.8 kg  05/05/19 63.5 kg  04/03/19 63.8 kg   80 y.o. female with medical history significant of asthma, CHF, CAD, DM, HTN history of carotid artery occlusion hx of pacemaker Admitted for bilateral leg weakness back pain, dehydration and hyponatremia  Pt admitted with bilateral leg weakness and dehydration.   Reviewed I/O's: -722 ml x 24 hours  Pt unavailable at time of visit. No family present to provide additional history.   Observed breakfast tray; pt consumed 100% of meal.   Medications reviewed and include aldactone.   Lab Results  Component Value Date   HGBA1C 5.6 04/16/2020   PTA DM medications are 50 mg januvia 3 times a week (Monday, Wednesday, and Friday).   Labs reviewed: Na: 131, CBGS: 105-138 (inpatient orders for glycemic control are 0-9 insulin aspart every 4 hours).   Current diet order is carb modified, patient is consuming approximately 100% of meals at this time. Labs and medications reviewed.   No nutrition interventions warranted at this time. If nutrition issues arise, please consult RD.   Loistine Chance, RD, LDN, [place] Registered Dietitian II Certified Diabetes Care and Education Specialist Please refer to [facility] for RD and/or RD on-call/weekend/after hours pager

## 2020-04-17 NOTE — Progress Notes (Signed)
Inpatient Rehab Admissions Coordinator:   Pt re-screened for CIR candidacy now that MRI is complete. MRI with no acute findings. Per MD, sx appear to be resolving. Pt. Is currently not a candidate for CIR due to absence of rehab-able dx. Pt. May benefit from therapies at a lower level of care, such as [facility] or SNF.   Clemens Catholic, [place], [place] Admissions Coordinator  [phone] ([place]) [phone] (office)

## 2020-04-17 NOTE — TOC Transition Note (Signed)
Transition of Care [facility]) - CM/SW Discharge Note   Patient Details  Name: Lynn Carey MRN: [id number] Date of Birth: 12-Apr-1940  [facility] CM/SW Contact:  Ninfa Meeker, RN Phone Number: 04/17/2020, 2:36 PM   Clinical Narrative:  Case manager contacted to arrange for [place] and DME for patient. Patient asked that I contact her daughter, [name] [phone], to see if it is ok for her to have therapy. She states she lives with her daughter. CM spoke with Ivin Booty who said absolutely no problem. Discussed Home Health agencies. Ivin Booty asked that Liaison please contact the patient to arrange dates and times for her therapy, patient's cell: [phone]. DME will be delivered to patient's room from [place]. No further needs identified.    Final next level of care: [place] Barriers to Discharge: No Barriers Identified   Patient Goals and CMS Choice Patient states their goals for this hospitalization and ongoing recovery are:: go home CMS Medicare.gov Compare Post Acute Care list provided to:: Patient Represenative (must comment) (daughter)    Discharge Placement                       Discharge Plan and Services In-house Referral: NA Discharge Planning Services: CM Consult Post Acute Care Choice: [place]          DME Arranged: 3-N-1,[place] rolling DME Agency: [employer] Date DME Agency Contacted: 04/17/20 Time DME Agency Contacted: [id number] Representative spoke with at DME Agency: [place]: PT,OT [place]: [place] Date [place]: 04/17/20 Time [place]: 1430 Representative spoke with at [place]: [place] ([place]) Interventions     Readmission Risk Interventions No flowsheet data found.

## 2020-04-17 NOTE — Plan of Care (Signed)

## 2020-04-18 ENCOUNTER — Other Ambulatory Visit: Payer: Self-pay

## 2020-04-18 ENCOUNTER — Telehealth: Payer: Self-pay

## 2020-04-18 MED ORDER — NITROGLYCERIN 0.4 MG SL SUBL: 0.4000 mg | SUBLINGUAL_TABLET | SUBLINGUAL | 0 refills | Status: AC | PRN

## 2020-04-18 NOTE — Telephone Encounter (Signed)
Ok. Pt has f/u w/Dr Einar Gip next month.   Thanks MJP

## 2020-04-18 NOTE — Telephone Encounter (Signed)
Patient's daughter called that her mom was in the hospital and pt was taken off [facility] she just wanted to make you aware

## 2020-04-18 NOTE — Telephone Encounter (Signed)
Transition Care Management Follow-up Telephone Call  Date of discharge and from where:04/17/2020 [facility]  How have you been since you were released from the hospital? Still have tingling in feet and hand, also a stomach ache.  Any questions or concerns? Don't understand about the bursitis from hips down and wants to know what it is.   Items Reviewed:  Did the pt receive and understand the discharge instructions provided? yes  Medications obtained and verified? Yes  Any new allergies since your discharge? No  Dietary orders reviewed? Low sodium  Do you have support at home? yes  Other (ie: DME, Home Health, etc) n/a  Functional Questionnaire: (I = Independent and D = Dependent)  Bathing/Dressing- D   Meal Prep- D  Eating- I  Maintaining continence- I  Transferring/Ambulation- D  Managing Meds- D   Follow up appointments reviewed:    PCP Hospital f/u appt confirmed? yes scheduled to see Dr.Sanders on 01/18 @ [facility] f/u appt confirmed?n/a  Are transportation arrangements needed? no  If their condition worsens, is the pt aware to call  their PCP or go to the ED?yes  Was the patient provided with contact information for the PCP's office or ED?yes Was the pt encouraged to call back with questions or concerns? yes

## 2020-04-18 NOTE — Telephone Encounter (Signed)
Transition Care Management Unsuccessful Follow-up Telephone Call  Date of discharge and from where:  04/17/2020 [facility]  Attempts:  1st Attempt  Reason for unsuccessful TCM follow-up call:  Unable to leave message

## 2020-04-19 ENCOUNTER — Telehealth: Payer: Self-pay

## 2020-04-19 ENCOUNTER — Other Ambulatory Visit: Payer: Self-pay | Admitting: Internal Medicine

## 2020-04-19 DIAGNOSIS — I255 Ischemic cardiomyopathy: Secondary | ICD-10-CM | POA: Diagnosis not present

## 2020-04-19 DIAGNOSIS — Z4502 Encounter for adjustment and management of automatic implantable cardiac defibrillator: Secondary | ICD-10-CM | POA: Diagnosis not present

## 2020-04-19 DIAGNOSIS — Z95810 Presence of automatic (implantable) cardiac defibrillator: Secondary | ICD-10-CM | POA: Diagnosis not present

## 2020-04-19 DIAGNOSIS — I5022 Chronic systolic (congestive) heart failure: Secondary | ICD-10-CM | POA: Diagnosis not present

## 2020-04-19 MED ORDER — TRAMADOL HCL 50 MG PO TABS: 50.0000 mg | ORAL_TABLET | Freq: Two times a day (BID) | ORAL | 0 refills | Status: AC | PRN

## 2020-04-19 NOTE — Telephone Encounter (Signed)
The pt said that she is having stomach pain and back pain.  The pt said she has been taking tylenol and that it's not helping. The pt said she is having pain where she was told she has bursitis at when she was in the hospital and that she has been up all night.  The pt rated her pain a 9 at this moment.  The pt was told that Dr Baird Cancer is going to send in tramadol to the pharmacy for the pt.

## 2020-04-23 ENCOUNTER — Telehealth: Payer: Self-pay | Admitting: Internal Medicine

## 2020-04-23 ENCOUNTER — Inpatient Hospital Stay: Payer: Self-pay | Admitting: Internal Medicine

## 2020-04-23 DIAGNOSIS — E860 Dehydration: Secondary | ICD-10-CM | POA: Diagnosis not present

## 2020-04-23 DIAGNOSIS — R296 Repeated falls: Secondary | ICD-10-CM

## 2020-04-23 DIAGNOSIS — J449 Chronic obstructive pulmonary disease, unspecified: Secondary | ICD-10-CM | POA: Diagnosis not present

## 2020-04-23 DIAGNOSIS — E871 Hypo-osmolality and hyponatremia: Secondary | ICD-10-CM

## 2020-04-23 DIAGNOSIS — I25810 Atherosclerosis of coronary artery bypass graft(s) without angina pectoris: Secondary | ICD-10-CM | POA: Diagnosis not present

## 2020-04-23 DIAGNOSIS — M545 Low back pain, unspecified: Secondary | ICD-10-CM

## 2020-04-23 DIAGNOSIS — N179 Acute kidney failure, unspecified: Secondary | ICD-10-CM | POA: Diagnosis not present

## 2020-04-23 DIAGNOSIS — E1140 Type 2 diabetes mellitus with diabetic neuropathy, unspecified: Secondary | ICD-10-CM | POA: Diagnosis not present

## 2020-04-23 DIAGNOSIS — I959 Hypotension, unspecified: Secondary | ICD-10-CM | POA: Diagnosis not present

## 2020-04-24 DIAGNOSIS — E1140 Type 2 diabetes mellitus with diabetic neuropathy, unspecified: Secondary | ICD-10-CM | POA: Diagnosis not present

## 2020-04-24 DIAGNOSIS — N179 Acute kidney failure, unspecified: Secondary | ICD-10-CM | POA: Diagnosis not present

## 2020-04-24 DIAGNOSIS — I25810 Atherosclerosis of coronary artery bypass graft(s) without angina pectoris: Secondary | ICD-10-CM | POA: Diagnosis not present

## 2020-04-24 DIAGNOSIS — J449 Chronic obstructive pulmonary disease, unspecified: Secondary | ICD-10-CM | POA: Diagnosis not present

## 2020-04-24 DIAGNOSIS — I959 Hypotension, unspecified: Secondary | ICD-10-CM | POA: Diagnosis not present

## 2020-04-24 DIAGNOSIS — M545 Low back pain, unspecified: Secondary | ICD-10-CM | POA: Diagnosis not present

## 2020-04-24 DIAGNOSIS — E860 Dehydration: Secondary | ICD-10-CM | POA: Diagnosis not present

## 2020-04-24 DIAGNOSIS — R296 Repeated falls: Secondary | ICD-10-CM | POA: Diagnosis not present

## 2020-04-24 DIAGNOSIS — E871 Hypo-osmolality and hyponatremia: Secondary | ICD-10-CM | POA: Diagnosis not present

## 2020-04-25 ENCOUNTER — Other Ambulatory Visit: Payer: Self-pay

## 2020-04-25 ENCOUNTER — Ambulatory Visit (INDEPENDENT_AMBULATORY_CARE_PROVIDER_SITE_OTHER): Payer: Medicare HMO | Admitting: Internal Medicine

## 2020-04-25 ENCOUNTER — Encounter: Payer: Self-pay | Admitting: Internal Medicine

## 2020-04-25 VITALS — BP 118/76 | HR 95 | Temp 98.0°F | Ht 61.0 in | Wt 145.2 lb

## 2020-04-25 DIAGNOSIS — R29898 Other symptoms and signs involving the musculoskeletal system: Secondary | ICD-10-CM | POA: Diagnosis not present

## 2020-04-25 DIAGNOSIS — I1310 Hypertensive heart and chronic kidney disease without heart failure, with stage 1 through stage 4 chronic kidney disease, or unspecified chronic kidney disease: Secondary | ICD-10-CM | POA: Diagnosis not present

## 2020-04-25 DIAGNOSIS — I700 Atherosclerosis of aorta: Secondary | ICD-10-CM

## 2020-04-25 DIAGNOSIS — E871 Hypo-osmolality and hyponatremia: Secondary | ICD-10-CM | POA: Diagnosis not present

## 2020-04-25 DIAGNOSIS — N1831 Chronic kidney disease, stage 3a: Secondary | ICD-10-CM

## 2020-04-25 DIAGNOSIS — I130 Hypertensive heart and chronic kidney disease with heart failure and stage 1 through stage 4 chronic kidney disease, or unspecified chronic kidney disease: Secondary | ICD-10-CM

## 2020-04-25 DIAGNOSIS — Z09 Encounter for follow-up examination after completed treatment for conditions other than malignant neoplasm: Secondary | ICD-10-CM

## 2020-04-25 DIAGNOSIS — Z79899 Other long term (current) drug therapy: Secondary | ICD-10-CM | POA: Diagnosis not present

## 2020-04-25 DIAGNOSIS — R630 Anorexia: Secondary | ICD-10-CM | POA: Diagnosis not present

## 2020-04-25 DIAGNOSIS — D649 Anemia, unspecified: Secondary | ICD-10-CM

## 2020-04-25 DIAGNOSIS — E1122 Type 2 diabetes mellitus with diabetic chronic kidney disease: Secondary | ICD-10-CM | POA: Diagnosis not present

## 2020-04-25 DIAGNOSIS — N182 Chronic kidney disease, stage 2 (mild): Secondary | ICD-10-CM

## 2020-04-25 MED ORDER — CYANOCOBALAMIN 1000 MCG/ML IJ SOLN
1000.0000 ug | Freq: Once | INTRAMUSCULAR | Status: AC
Start: 2020-04-25 — End: 2020-04-25
  Administered 2020-04-25: 1000 ug via INTRAMUSCULAR

## 2020-04-25 MED ORDER — MIRTAZAPINE 15 MG PO TABS: 15.0000 mg | ORAL_TABLET | Freq: Every day | ORAL | 2 refills | Status: AC

## 2020-04-25 NOTE — Progress Notes (Signed)
I,Katawbba Wiggins,acting as a Education administrator for Maximino Greenland, MD.,have documented all relevant documentation on the behalf of Maximino Greenland, MD,as directed by  Maximino Greenland, MD while in the presence of Maximino Greenland, MD.  This visit occurred during the SARS-CoV-2 public health emergency.  Safety protocols were in place, including screening questions prior to the visit, additional usage of staff PPE, and extensive cleaning of exam room while observing appropriate contact time as indicated for disinfecting solutions.  Subjective:     Patient ID: Lynn Carey , female    DOB: July 14, 1940 , 80 y.o.   MRN: [id number]   Chief Complaint  Patient presents with  . Hospitalization Follow-up    HPI  The patient is here today for a hospital follow-up. She is accompanied by her daughter, Willette Cluster today. Karena Addison, another daughter, is on FaceTime.  She presented to ER on 04/15/20 with multiple complaints including tingling in hands and feet, slurred speech, generalized weakness, and multiple falls.  Of note, she had some abdominal pain associated with constipation and back pain a week prior to admission. This has since resolved. She is most bothered by LE weakness. States she takes a few steps, and then she feels that her legs/knees will give out.  .In the ED, many tests were ordered: COVID-negative,CT head without acute finding, chest x-ray with atelectasis CT angio chest abdomen pelvis-mild diffuse bronchial wall thickening suggestive of bronchitis, bilateral pulmonary nodules follow-up and CT in 2 months advised, moderate stenosis of the left renal artery and mild stenosis of the right renal artery, CT chest no PE.Neurology was consulted: advised MRI brain and spine, patient was admittedforbilateral leg weakness, hyponatremia, dehydration.  Hospital course significant for:  unremarkable MRI brain and MRI lumbar spine.  She has history of bilateral hip replacement prosthesis remains a stable on x-ray.Seen by  PT OT suspect fall and presentation from dehydration, deconditioning, and hypotension.  Her BP meds Amlodipine Lasix and BiDil were held during admission. Follow-up with primary cardiologist to resume diuretics soon, she will continue Aldactone, Entresto, and Toprol.    She was discharged in stable condition on 04/17/20. She has not had any further falls since d/c home. However, she reports still feeling weak and has right hip pain.  She has not taken lasix since d/c from hospital. She was advised to f/u with Cardiology one week post-discharge.      Past Medical History:  Diagnosis Date  . Arthritis    hip - both, shot in L shoulder- 2 weeks ago  . Asthma   . Carotid artery occlusion   . Chronic systolic heart failure ([place]) 09/19/2018  . Complication of anesthesia   . Coronary artery disease    MI in 1997, 2009  . Diabetes mellitus without complication ([place])   . GERD (gastroesophageal reflux disease)   . Hypertension    followed by Dr. Einar Gip  . ICD Single chamber Medtronic VISIA MRI VR [id number] 04/21/2019 04/21/2019  . PONV (postoperative nausea and vomiting)      Family History  Problem Relation Age of Onset  . Allergies Mother   . Asthma Mother   . Heart disease Mother   . Heart attack Mother   . Allergies Son   . Asthma Son   . Heart disease Father   . Heart attack Father   . Breast cancer Daughter 23     Current Outpatient Medications:  .  albuterol (VENTOLIN HFA) 108 (90 Base) MCG/ACT inhaler, Inhale 2 puffs into the  lungs every 4 (four) hours as needed for wheezing or shortness of breath., Disp: 6.7 g, Rfl: 3 .  Cholecalciferol (VITAMIN D) 2000 UNITS CAPS, Take 2,000 Units by mouth daily. , Disp: , Rfl:  .  clopidogrel (PLAVIX) 75 MG tablet, TAKE 1 TABLET EVERY DAY (Patient taking differently: Take 75 mg by mouth.), Disp: 90 tablet, Rfl: 0 .  dorzolamide-timolol (COSOPT) 22.3-6.8 MG/ML ophthalmic solution, Place 1 drop into both eyes 2 (two) times daily., Disp: , Rfl:  .   ezetimibe (ZETIA) 10 MG tablet, TAKE 1 TABLET EVERY DAY (Patient taking differently: Take 10 mg by mouth daily.), Disp: 90 tablet, Rfl: 2 .  famotidine (PEPCID) 20 MG tablet, Take 20 mg by mouth at bedtime as needed for heartburn or indigestion., Disp: , Rfl:  .  linaclotide (LINZESS) 72 MCG capsule, Take 1 capsule (72 mcg total) by mouth daily before breakfast., Disp: 30 capsule, Rfl: 0 .  mirtazapine (REMERON) 15 MG tablet, Take 1 tablet (15 mg total) by mouth at bedtime., Disp: 30 tablet, Rfl: 2 .  montelukast (SINGULAIR) 10 MG tablet, Take 1 tablet (10 mg total) by mouth at bedtime., Disp: 30 tablet, Rfl: 0 .  nitroGLYCERIN (NITROSTAT) 0.4 MG SL tablet, Place 1 tablet (0.4 mg total) under the tongue every 5 (five) minutes as needed for chest pain., Disp: 30 tablet, Rfl: 0 .  pantoprazole (PROTONIX) 40 MG tablet, TAKE 1 TABLET EVERY DAY (Patient taking differently: Take 40 mg by mouth daily.), Disp: 90 tablet, Rfl: 1 .  ranolazine (RANEXA) 500 MG 12 hr tablet, Take 2 tablets (1,000 mg total) by mouth 2 (two) times daily., Disp: 60 tablet, Rfl: 6 .  rosuvastatin (CRESTOR) 40 MG tablet, TAKE 1/2 TABLET EVERY DAY (Patient taking differently: Take 20 mg by mouth daily.), Disp: 45 tablet, Rfl: 3 .  sacubitril-valsartan (ENTRESTO) 97-103 MG, Take 1 tablet by mouth 2 (two) times daily., Disp: 120 tablet, Rfl: 3 .  sitaGLIPtin (JANUVIA) 50 MG tablet, Take 1 tablet (50 mg total) by mouth daily., Disp: 90 tablet, Rfl: 1 .  spironolactone (ALDACTONE) 50 MG tablet, TAKE 1 TABLET BY MOUTH EVERY DAY (Patient taking differently: Take 50 mg by mouth daily.), Disp: 90 tablet, Rfl: 2 .  temazepam (RESTORIL) 15 MG capsule, Take 15 mg by mouth at bedtime as needed for sleep., Disp: , Rfl: 1 .  traMADol (ULTRAM) 50 MG tablet, Take 1 tablet (50 mg total) by mouth every 12 (twelve) hours as needed. (Patient not taking: No sig reported), Disp: 30 tablet, Rfl: 0 .  Accu-Chek FastClix Lancets MISC, Use as directed to check  blood sugars 2 times per day dx: e11.9, Disp: 100 each, Rfl: 1 .  Blood Glucose Monitoring Suppl (ACCU-CHEK AVIVA PLUS) w/Device KIT, Use as directed to check blood sugars 2 times per day dx: e11.9, Disp: 1 kit, Rfl: 1 .  gabapentin (NEURONTIN) 300 MG capsule, TAKE 1 CAPSULE(300 MG) BY MOUTH FOUR TIMES DAILY (Patient taking differently: Take 300 mg by mouth 4 (four) times daily.), Disp: 120 capsule, Rfl: 0 .  glucose blood (ACCU-CHEK AVIVA PLUS) test strip, Use as directed to check blood sugars 2 times per day dx: e11.9, Disp: 100 each, Rfl: 2 .  latanoprost (XALATAN) 0.005 % ophthalmic solution, Place 1 drop into both eyes at bedtime. Newly Prescribed, Disp: , Rfl:  .  metoprolol succinate (TOPROL-XL) 100 MG 24 hr tablet, Take 1 tablet (100 mg total) by mouth daily. Take with or immediately following a meal., Disp: 90 tablet, Rfl:   3   Allergies  Allergen Reactions  . Lisinopril Cough     Review of Systems  Constitutional: Positive for appetite change and fatigue.  Respiratory: Negative.   Cardiovascular: Negative.   Gastrointestinal: Negative.   Neurological: Positive for weakness.  Psychiatric/Behavioral: Negative.   All other systems reviewed and are negative.    Today's Vitals   04/25/20 1009  BP: 118/76  Pulse: 95  Temp: 98 F (36.7 C)  TempSrc: Oral  Weight: 145 lb 3.2 oz (65.9 kg)  Height: 5' 1" (1.549 m)  PainSc: 0-No pain   Body mass index is 27.44 kg/m.  Wt Readings from Last 3 Encounters:  05/26/2020 139 lb 12.4 oz (63.4 kg)  05/15/20 144 lb 3.2 oz (65.4 kg)  04/25/20 145 lb 3.2 oz (65.9 kg)   Objective:  Physical Exam Vitals and nursing note reviewed.  Constitutional:      Appearance: Normal appearance. She is normal weight.  HENT:     Head: Normocephalic and atraumatic.     Nose:     Comments: Masked     Mouth/Throat:     Comments: Masked  Cardiovascular:     Rate and Rhythm: Normal rate and regular rhythm.     Heart sounds: Normal heart sounds.   Pulmonary:     Effort: Pulmonary effort is normal.     Breath sounds: Normal breath sounds.  Musculoskeletal:     Cervical back: Normal range of motion.     Right lower leg: No edema.     Left lower leg: No edema.  Skin:    General: Skin is warm.  Neurological:     General: No focal deficit present.     Mental Status: She is alert and oriented to person, place, and time.         Assessment And Plan:     1. Bilateral leg weakness Comments: She is now ambulating with assistance. She would likely benefit from home PT/OT. Daughter wishes to take her Dr. Dalldorf, Ortho for further evaluation of her hip pain.   2. Hyponatremia Comments: I will recheck sodium level today. I will recheck potassium level as well. - CMP14+EGFR  3. Anemia, unspecified type Comments: I will check vitamin B12 level. She was also given vitamin B12 IM x 1.  - cyanocobalamin ((VITAMIN B-12)) injection 1,000 mcg  4. Decreased appetite I will start her on Remeron, 15mg daily. Advised to take at night. She agrees to f/u in six weeks for re-evaluation.   5. Type 2 diabetes mellitus with stage 3a chronic kidney disease, without long-term current use of insulin (HCC) Comments: Chronic, a1c 5.6 while in hospital. No need for change in meds. Will continue to monitor renal function, s/p AKI during hospitalization.   6. Hypertensive heart and renal disease with renal failure, stage 1 through stage 4 or unspecified chronic kidney disease, with heart failure (HCC) Comments: Chronic, well controlled. She has systolic heart failure. Encouraged to follow low sodium diet.  I will check CMP today. She will c/w aldactone, Entresto & Toprol. She appears to be euvolemic. She will continue to hold furosemide until advised to resume by Cardiology.  - CBC with Diff - CMP14+EGFR  7. Aortic atherosclerosis (HCC) Comments: Chronic. Chronic, encouraged to follow heart healthy lifestyle. Clean eating, regular exercise and stress  management are all a part of this lifestyle. She is also encouraged to continue with statin therapy.   8. Drug therapy Comments: I will check vitamin B12 level.  - Vitamin B12     Patient was given opportunity to ask questions. Patient verbalized understanding of the plan and was able to repeat key elements of the plan. All questions were answered to their satisfaction.   I,  N , MD, have reviewed all documentation for this visit. The documentation on 06/23/20 for the exam, diagnosis, procedures, and orders are all accurate and complete.  THE PATIENT IS ENCOURAGED TO PRACTICE SOCIAL DISTANCING DUE TO THE COVID-19 PANDEMIC.   

## 2020-04-26 ENCOUNTER — Telehealth: Payer: Self-pay

## 2020-04-26 LAB — CBC WITH DIFFERENTIAL/PLATELET
Basophils Absolute: 0 10*3/uL (ref 0.0–0.2)
Basos: 0 %
EOS (ABSOLUTE): 0.2 10*3/uL (ref 0.0–0.4)
Eos: 2 %
Hematocrit: 38.6 % (ref 34.0–46.6)
Hemoglobin: 13 g/dL (ref 11.1–15.9)
Immature Grans (Abs): 0.1 10*3/uL (ref 0.0–0.1)
Immature Granulocytes: 1 %
Lymphocytes Absolute: 1.5 10*3/uL (ref 0.7–3.1)
Lymphs: 16 %
MCH: 29.6 pg (ref 26.6–33.0)
MCHC: 33.7 g/dL (ref 31.5–35.7)
MCV: 88 fL (ref 79–97)
Monocytes Absolute: 0.9 10*3/uL (ref 0.1–0.9)
Monocytes: 9 %
NRBC: 1 % — ABNORMAL HIGH (ref 0–0)
Neutrophils Absolute: 6.9 10*3/uL (ref 1.4–7.0)
Neutrophils: 72 %
Platelets: 263 10*3/uL (ref 150–450)
RBC: 4.39 x10E6/uL (ref 3.77–5.28)
RDW: 13.7 % (ref 11.7–15.4)
WBC: 9.7 10*3/uL (ref 3.4–10.8)

## 2020-04-26 LAB — CMP14+EGFR
ALT: 734 IU/L (ref 0–32)
AST: 278 IU/L — ABNORMAL HIGH (ref 0–40)
Albumin/Globulin Ratio: 1.3 (ref 1.2–2.2)
Albumin: 4 g/dL (ref 3.7–4.7)
Alkaline Phosphatase: 142 IU/L — ABNORMAL HIGH (ref 44–121)
BUN/Creatinine Ratio: 21 (ref 12–28)
BUN: 26 mg/dL (ref 8–27)
Bilirubin Total: 0.7 mg/dL (ref 0.0–1.2)
CO2: 21 mmol/L (ref 20–29)
Calcium: 10 mg/dL (ref 8.7–10.3)
Chloride: 102 mmol/L (ref 96–106)
Creatinine, Ser: 1.23 mg/dL — ABNORMAL HIGH (ref 0.57–1.00)
GFR calc Af Amer: 48 mL/min/{1.73_m2} — ABNORMAL LOW (ref >59)
GFR calc non Af Amer: 42 mL/min/{1.73_m2} — ABNORMAL LOW (ref >59)
Globulin, Total: 3.2 g/dL (ref 1.5–4.5)
Glucose: 90 mg/dL (ref 65–99)
Potassium: 4.7 mmol/L (ref 3.5–5.2)
Sodium: 140 mmol/L (ref 134–144)
Total Protein: 7.2 g/dL (ref 6.0–8.5)

## 2020-04-26 LAB — VITAMIN B12: Vitamin B-12: 1467 pg/mL — ABNORMAL HIGH (ref 232–1245)

## 2020-04-26 NOTE — Telephone Encounter (Signed)
The pt was notified that Dr Baird Cancer said to stop the tylenol because the pt's liver enzymes are elevated.

## 2020-04-27 ENCOUNTER — Other Ambulatory Visit: Payer: Self-pay | Admitting: Internal Medicine

## 2020-04-27 DIAGNOSIS — R748 Abnormal levels of other serum enzymes: Secondary | ICD-10-CM

## 2020-04-28 ENCOUNTER — Telehealth: Payer: Self-pay | Admitting: Cardiology

## 2020-04-29 ENCOUNTER — Other Ambulatory Visit: Payer: Self-pay

## 2020-04-29 DIAGNOSIS — R7989 Other specified abnormal findings of blood chemistry: Secondary | ICD-10-CM

## 2020-04-29 DIAGNOSIS — M545 Low back pain, unspecified: Secondary | ICD-10-CM | POA: Diagnosis not present

## 2020-04-30 ENCOUNTER — Ambulatory Visit
Admission: RE | Admit: 2020-04-30 | Discharge: 2020-04-30 | Disposition: A | Discharge status: Home / Self Care | Payer: Medicare HMO | Source: Ambulatory Visit | Attending: Internal Medicine | Admitting: Internal Medicine

## 2020-04-30 ENCOUNTER — Telehealth: Payer: Self-pay

## 2020-04-30 DIAGNOSIS — K7689 Other specified diseases of liver: Secondary | ICD-10-CM | POA: Diagnosis not present

## 2020-04-30 DIAGNOSIS — J449 Chronic obstructive pulmonary disease, unspecified: Secondary | ICD-10-CM | POA: Diagnosis not present

## 2020-04-30 DIAGNOSIS — E871 Hypo-osmolality and hyponatremia: Secondary | ICD-10-CM | POA: Diagnosis not present

## 2020-04-30 DIAGNOSIS — R748 Abnormal levels of other serum enzymes: Secondary | ICD-10-CM

## 2020-04-30 DIAGNOSIS — I25810 Atherosclerosis of coronary artery bypass graft(s) without angina pectoris: Secondary | ICD-10-CM | POA: Diagnosis not present

## 2020-04-30 DIAGNOSIS — R296 Repeated falls: Secondary | ICD-10-CM | POA: Diagnosis not present

## 2020-04-30 DIAGNOSIS — N179 Acute kidney failure, unspecified: Secondary | ICD-10-CM | POA: Diagnosis not present

## 2020-04-30 DIAGNOSIS — I959 Hypotension, unspecified: Secondary | ICD-10-CM | POA: Diagnosis not present

## 2020-04-30 DIAGNOSIS — E1140 Type 2 diabetes mellitus with diabetic neuropathy, unspecified: Secondary | ICD-10-CM | POA: Diagnosis not present

## 2020-04-30 DIAGNOSIS — M545 Low back pain, unspecified: Secondary | ICD-10-CM | POA: Diagnosis not present

## 2020-04-30 DIAGNOSIS — E860 Dehydration: Secondary | ICD-10-CM | POA: Diagnosis not present

## 2020-04-30 NOTE — Telephone Encounter (Signed)
The pt said she was returning a call.  The pt was told that I don't see who called her but that I see a lab order that the pt needed to have done Thursday.

## 2020-05-02 ENCOUNTER — Other Ambulatory Visit: Payer: Medicare HMO

## 2020-05-02 ENCOUNTER — Other Ambulatory Visit: Payer: Self-pay

## 2020-05-02 ENCOUNTER — Other Ambulatory Visit: Payer: Self-pay | Admitting: Internal Medicine

## 2020-05-02 DIAGNOSIS — I25810 Atherosclerosis of coronary artery bypass graft(s) without angina pectoris: Secondary | ICD-10-CM | POA: Diagnosis not present

## 2020-05-02 DIAGNOSIS — M545 Low back pain, unspecified: Secondary | ICD-10-CM | POA: Diagnosis not present

## 2020-05-02 DIAGNOSIS — I959 Hypotension, unspecified: Secondary | ICD-10-CM | POA: Diagnosis not present

## 2020-05-02 DIAGNOSIS — R296 Repeated falls: Secondary | ICD-10-CM | POA: Diagnosis not present

## 2020-05-02 DIAGNOSIS — N179 Acute kidney failure, unspecified: Secondary | ICD-10-CM | POA: Diagnosis not present

## 2020-05-02 DIAGNOSIS — E1140 Type 2 diabetes mellitus with diabetic neuropathy, unspecified: Secondary | ICD-10-CM | POA: Diagnosis not present

## 2020-05-02 DIAGNOSIS — R7989 Other specified abnormal findings of blood chemistry: Secondary | ICD-10-CM | POA: Diagnosis not present

## 2020-05-02 DIAGNOSIS — E871 Hypo-osmolality and hyponatremia: Secondary | ICD-10-CM | POA: Diagnosis not present

## 2020-05-02 DIAGNOSIS — E860 Dehydration: Secondary | ICD-10-CM | POA: Diagnosis not present

## 2020-05-02 DIAGNOSIS — J449 Chronic obstructive pulmonary disease, unspecified: Secondary | ICD-10-CM | POA: Diagnosis not present

## 2020-05-03 LAB — HEPATIC FUNCTION PANEL
ALT: 104 IU/L — ABNORMAL HIGH (ref 0–32)
AST: 29 IU/L (ref 0–40)
Albumin: 3.8 g/dL (ref 3.7–4.7)
Alkaline Phosphatase: 68 IU/L (ref 44–121)
Bilirubin Total: 0.6 mg/dL (ref 0.0–1.2)
Bilirubin, Direct: 0.25 mg/dL (ref 0.00–0.40)
Total Protein: 6.6 g/dL (ref 6.0–8.5)

## 2020-05-05 NOTE — Telephone Encounter (Signed)
Patient aware of ICD/pacemaker function

## 2020-05-07 ENCOUNTER — Other Ambulatory Visit: Payer: Self-pay

## 2020-05-07 DIAGNOSIS — E871 Hypo-osmolality and hyponatremia: Secondary | ICD-10-CM | POA: Diagnosis not present

## 2020-05-07 DIAGNOSIS — E1140 Type 2 diabetes mellitus with diabetic neuropathy, unspecified: Secondary | ICD-10-CM | POA: Diagnosis not present

## 2020-05-07 DIAGNOSIS — E860 Dehydration: Secondary | ICD-10-CM | POA: Diagnosis not present

## 2020-05-07 DIAGNOSIS — M545 Low back pain, unspecified: Secondary | ICD-10-CM | POA: Diagnosis not present

## 2020-05-07 DIAGNOSIS — R296 Repeated falls: Secondary | ICD-10-CM | POA: Diagnosis not present

## 2020-05-07 DIAGNOSIS — J449 Chronic obstructive pulmonary disease, unspecified: Secondary | ICD-10-CM | POA: Diagnosis not present

## 2020-05-07 DIAGNOSIS — I959 Hypotension, unspecified: Secondary | ICD-10-CM | POA: Diagnosis not present

## 2020-05-07 DIAGNOSIS — N179 Acute kidney failure, unspecified: Secondary | ICD-10-CM | POA: Diagnosis not present

## 2020-05-07 DIAGNOSIS — I25810 Atherosclerosis of coronary artery bypass graft(s) without angina pectoris: Secondary | ICD-10-CM | POA: Diagnosis not present

## 2020-05-07 MED ORDER — ONETOUCH DELICA LANCETS 33G MISC: 1 refills | Status: DC

## 2020-05-07 MED ORDER — ONETOUCH VERIO VI STRP: ORAL_STRIP | 1 refills | Status: DC

## 2020-05-07 MED ORDER — ONETOUCH VERIO W/DEVICE KIT: PACK | 1 refills | Status: DC

## 2020-05-08 ENCOUNTER — Other Ambulatory Visit: Payer: Self-pay

## 2020-05-08 ENCOUNTER — Ambulatory Visit: Payer: Medicare HMO

## 2020-05-08 DIAGNOSIS — I5022 Chronic systolic (congestive) heart failure: Secondary | ICD-10-CM | POA: Diagnosis not present

## 2020-05-08 DIAGNOSIS — I6523 Occlusion and stenosis of bilateral carotid arteries: Secondary | ICD-10-CM

## 2020-05-08 MED ORDER — ACCU-CHEK FASTCLIX LANCETS MISC: 1 refills | Status: AC

## 2020-05-08 MED ORDER — ACCU-CHEK AVIVA PLUS W/DEVICE KIT: PACK | 1 refills | Status: AC

## 2020-05-08 MED ORDER — ACCU-CHEK AVIVA PLUS VI STRP: ORAL_STRIP | 2 refills | Status: AC

## 2020-05-09 DIAGNOSIS — I25810 Atherosclerosis of coronary artery bypass graft(s) without angina pectoris: Secondary | ICD-10-CM | POA: Diagnosis not present

## 2020-05-09 DIAGNOSIS — E871 Hypo-osmolality and hyponatremia: Secondary | ICD-10-CM | POA: Diagnosis not present

## 2020-05-09 DIAGNOSIS — N179 Acute kidney failure, unspecified: Secondary | ICD-10-CM | POA: Diagnosis not present

## 2020-05-09 DIAGNOSIS — R296 Repeated falls: Secondary | ICD-10-CM | POA: Diagnosis not present

## 2020-05-09 DIAGNOSIS — I959 Hypotension, unspecified: Secondary | ICD-10-CM | POA: Diagnosis not present

## 2020-05-09 DIAGNOSIS — E860 Dehydration: Secondary | ICD-10-CM | POA: Diagnosis not present

## 2020-05-09 DIAGNOSIS — M545 Low back pain, unspecified: Secondary | ICD-10-CM | POA: Diagnosis not present

## 2020-05-09 DIAGNOSIS — E1140 Type 2 diabetes mellitus with diabetic neuropathy, unspecified: Secondary | ICD-10-CM | POA: Diagnosis not present

## 2020-05-09 DIAGNOSIS — J449 Chronic obstructive pulmonary disease, unspecified: Secondary | ICD-10-CM | POA: Diagnosis not present

## 2020-05-10 ENCOUNTER — Other Ambulatory Visit: Payer: Self-pay | Admitting: Internal Medicine

## 2020-05-12 ENCOUNTER — Other Ambulatory Visit: Payer: Self-pay | Admitting: Cardiology

## 2020-05-12 DIAGNOSIS — I6523 Occlusion and stenosis of bilateral carotid arteries: Secondary | ICD-10-CM

## 2020-05-13 NOTE — Progress Notes (Signed)
Patient has follow up with you Wednesday.

## 2020-05-14 DIAGNOSIS — R296 Repeated falls: Secondary | ICD-10-CM | POA: Diagnosis not present

## 2020-05-14 DIAGNOSIS — J449 Chronic obstructive pulmonary disease, unspecified: Secondary | ICD-10-CM | POA: Diagnosis not present

## 2020-05-14 DIAGNOSIS — I959 Hypotension, unspecified: Secondary | ICD-10-CM | POA: Diagnosis not present

## 2020-05-14 DIAGNOSIS — M545 Low back pain, unspecified: Secondary | ICD-10-CM | POA: Diagnosis not present

## 2020-05-14 DIAGNOSIS — N179 Acute kidney failure, unspecified: Secondary | ICD-10-CM | POA: Diagnosis not present

## 2020-05-14 DIAGNOSIS — E1140 Type 2 diabetes mellitus with diabetic neuropathy, unspecified: Secondary | ICD-10-CM | POA: Diagnosis not present

## 2020-05-14 DIAGNOSIS — E860 Dehydration: Secondary | ICD-10-CM | POA: Diagnosis not present

## 2020-05-14 DIAGNOSIS — I25810 Atherosclerosis of coronary artery bypass graft(s) without angina pectoris: Secondary | ICD-10-CM | POA: Diagnosis not present

## 2020-05-14 DIAGNOSIS — E871 Hypo-osmolality and hyponatremia: Secondary | ICD-10-CM | POA: Diagnosis not present

## 2020-05-14 NOTE — Progress Notes (Signed)
Primary Physician/Referring:  Glendale Chard, MD  Patient ID: Lynn Carey, female    DOB: [birth year], 80 y.o.   MRN: [id number]  Chief Complaint  Patient presents with   Congestive Heart Failure   Carotid Stenosis   Follow-up    6 month   HPI:    Lynn Carey  is a 81 y.o. AA female with a history of known coronary artery disease with CABG in 1997 with LIMA to LAD and SVG to RCA and PCI to SVG 06/23/17, ischemic and nonischemic cardiomyopathy with decreased EF of 20% (out of proportion to CAD) SP single-chamber Medtronic ICD implantation in January 2021 for primary prevention of sudden cardiac death. She has bilateral asymptomatic high grade (70% by duplex) carotid stenosis, hyperlipidemia, stage IIIb chronic kidney disease, asthmatic bronchitis and hypertension. She is still working in [place] in [employer].  In 10/2019 patient believed by her ophthalmologist to have had a stroke in her right eye, she did not have any neurological deficits.  At that time outpatient CT angio of the neck revealed moderate disease in bilateral ICA.   Patient presents for 3 month follow up.  She is presently feeling well.  Denies chest pain, dyspnea, PND, orthopnea, leg edema.  She has had a fall on 05/07/2020, presently using a walker.  No other specific symptoms today.  She has not had any recurrence of strokelike symptoms.  Past Medical History:  Diagnosis Date   Anginal pain ([place])    h/o, denies today    Arthritis    hip - both, shot in L shoulder- 2 weeks ago   Asthma    Carotid artery occlusion    CHF (congestive heart failure) (HCC)    Chronic systolic heart failure ([place]) 0/17/7939   Complication of anesthesia    Coronary artery disease    Diabetes mellitus without complication ([place])    Encounter for assessment of implantable cardioverter-defibrillator (ICD) 08/30/2019   GERD (gastroesophageal reflux disease)    Hypertension    followed by Dr. Einar Gip   ICD Single chamber  Medtronic VISIA MRI VR [id number] 04/21/2019 04/21/2019   MYOCARDIAL INFARCTION 05/25/2007   Qualifier: History of  By: Wynetta Emery RN, Erika     Myocardial infarction ([place]) 1997   PONV (postoperative nausea and vomiting)    Shortness of breath    Past Surgical History:  Procedure Laterality Date   CARDIAC CATHETERIZATION  05/2012   see note   CORONARY ARTERY BYPASS GRAFT  [place] Right 06/18/2017   Procedure: CORONARY STENT INTERVENTION;  Surgeon: Adrian Prows, MD;  Location: [facility];  Service: Cardiovascular;  Laterality: Right;   EYE SURGERY     both eyes, laser procedure   ICD IMPLANT N/A 04/21/2019   Procedure: ICD IMPLANT;  Surgeon: Deboraha Sprang, MD;  Location: [facility];  Service: Cardiovascular;  Laterality: N/A;   JOINT REPLACEMENT Left 12/13/12   hip   LEFT HEART CATH AND CORONARY ANGIOGRAPHY N/A 11/25/2017   Procedure: LEFT HEART CATH AND CORONARY ANGIOGRAPHY;  Surgeon: [name], MD;  Location: [facility];  Service: Cardiovascular;  Laterality: N/A;   LEFT HEART CATH AND CORS/GRAFTS ANGIOGRAPHY N/A 06/18/2017   Procedure: LEFT HEART CATH AND CORS/GRAFTS ANGIOGRAPHY;  Surgeon: Adrian Prows, MD;  Location: [facility];  Service: Cardiovascular;  Laterality: N/A;   LEFT HEART CATHETERIZATION WITH CORONARY/GRAFT ANGIOGRAM N/A 04/21/2011   Procedure: LEFT HEART CATHETERIZATION WITH  Beatrix Fetters;  Surgeon: Laverda Page, MD;  Location: [facility];  Service: Cardiovascular;  Laterality: N/A;   PERCUTANEOUS CORONARY INTERVENTION-BALLOON ONLY Right 05/12/2011   Procedure: PERCUTANEOUS CORONARY INTERVENTION-BALLOON ONLY;  Surgeon: Laverda Page, MD;  Location: [facility];  Service: Cardiovascular;  Laterality: Right;   TOTAL HIP ARTHROPLASTY Left 12/13/2012   Procedure: TOTAL HIP ARTHROPLASTY ANTERIOR APPROACH;  Surgeon: Hessie Dibble, MD;  Location: [facility];  Service: Orthopedics;   Laterality: Left;   TOTAL HIP ARTHROPLASTY Right 01/30/2014   Procedure: TOTAL HIP ARTHROPLASTY ANTERIOR APPROACH;  Surgeon: Hessie Dibble, MD;  Location: [facility];  Service: Orthopedics;  Laterality: Right;   TUBAL LIGATION     Family History  Problem Relation Age of Onset   Allergies Mother    Asthma Mother    Heart disease Mother    Heart attack Mother    Allergies Son    Asthma Son    Heart disease Father    Heart attack Father    Breast cancer Daughter 41    Social History   Tobacco Use   Smoking status: Former Smoker    Packs/day: 0.50    Years: 2.00    Pack years: 1.00    Types: Cigarettes    Quit date: 12/08/1983    Years since quitting: 36.4   Smokeless tobacco: Never Used  Substance Use Topics   Alcohol use: No   Marital Status: Divorced  ROS  Review of Systems  Cardiovascular: Negative for chest pain, dyspnea on exertion and leg swelling.  Gastrointestinal: Negative for melena.   Objective  Blood pressure (!) 149/86, pulse 84, temperature (!) 97.3 F (36.3 C), temperature source Temporal, resp. rate 16, height $RemoveBe'5\' 1"'KLrDWwlIZ$  (1.549 m), weight 144 lb 3.2 oz (65.4 kg), SpO2 91 %.  Vitals with BMI 05/15/2020 04/25/2020 04/17/2020  Height $Remov'5\' 1"'VgtLRN$  $Remove'5\' 1"'EWXiFsn$  -  Weight 144 lbs 3 oz 145 lbs 3 oz -  BMI 16.10 96.04 -  Systolic 540 981 191  Diastolic 86 76 71  Pulse 84 95 78     Physical Exam Vitals reviewed.  Constitutional:      General: She is not in acute distress.    Appearance: She is well-developed.  HENT:     Head: Normocephalic and atraumatic.  Cardiovascular:     Rate and Rhythm: Normal rate and regular rhythm.     Pulses: Intact distal pulses.          Carotid pulses are on the right side with bruit and on the left side with bruit.    Heart sounds: S1 normal and S2 normal. No murmur heard. No gallop.      Comments: No leg edema, no JVD.  Pulmonary:     Effort: Pulmonary effort is normal. No accessory muscle usage or respiratory distress.      Breath sounds: Normal breath sounds. No wheezing, rhonchi or rales.  Musculoskeletal:     Right lower leg: No edema.     Left lower leg: No edema.  Neurological:     Mental Status: She is alert.    Laboratory examination:   Recent Labs    07/26/19 1139 10/25/19 1900 04/10/20 2106 04/16/20 0442 04/17/20 0250 04/25/20 1110  NA 143 141   < > 131* 131* 140  K 4.7 3.7   < > 3.1* 4.2 4.7  CL 102 106   < > 96* 99 102  CO2 25 23   < > 24 21* 21  GLUCOSE  77 107*   < > 125* 109* 90  BUN 23 13   < > $R'14 9 26  'Lz$ CREATININE 1.33* 1.10*   < > 1.14* 0.88 1.23*  CALCIUM 10.0 9.7   < > 8.8* 8.8* 10.0  GFRNONAA 38* 48*   < > 49* >60 42*  GFRAA 44* 56*  --   --   --  48*   < > = values in this interval not displayed.   estimated creatinine clearance is 32.1 mL/min (A) (by C-G formula based on SCr of 1.23 mg/dL (H)).  CMP Latest Ref Rng & Units 05/02/2020 04/25/2020 04/17/2020  Glucose 65 - 99 mg/dL - 90 109(H)  BUN 8 - 27 mg/dL - 26 9  Creatinine 0.57 - 1.00 mg/dL - 1.23(H) 0.88  Sodium 134 - 144 mmol/L - 140 131(L)  Potassium 3.5 - 5.2 mmol/L - 4.7 4.2  Chloride 96 - 106 mmol/L - 102 99  CO2 20 - 29 mmol/L - 21 21(L)  Calcium 8.7 - 10.3 mg/dL - 10.0 8.8(L)  Total Protein 6.0 - 8.5 g/dL 6.6 7.2 -  Total Bilirubin 0.0 - 1.2 mg/dL 0.6 0.7 -  Alkaline Phos 44 - 121 IU/L 68 142(H) -  AST 0 - 40 IU/L 29 278(H) -  ALT 0 - 32 IU/L 104(H) 734(HH) -   CBC Latest Ref Rng & Units 04/25/2020 04/17/2020 04/16/2020  WBC 3.4 - 10.8 x10E3/uL 9.7 5.1 6.0  Hemoglobin 11.1 - 15.9 g/dL 13.0 10.8(L) 11.7(L)  Hematocrit 34.0 - 46.6 % 38.6 32.4(L) 35.3(L)  Platelets 150 - 450 x10E3/uL 263 318 350   Lipid Panel     Component Value Date/Time   CHOL 159 07/26/2019 1139   TRIG 134 07/26/2019 1139   HDL 55 07/26/2019 1139   CHOLHDL 2.9 07/26/2019 1139   LDLCALC 81 07/26/2019 1139   HEMOGLOBIN A1C Lab Results  Component Value Date   HGBA1C 5.6 04/16/2020   MPG 114.02 04/16/2020   TSH Recent Labs     07/26/19 1139 04/16/20 0443  TSH 2.180 3.378   Medications and allergies   Allergies  Allergen Reactions   Lisinopril Cough    Outpatient Encounter Medications as of 05/15/2020  Medication Sig   Accu-Chek FastClix Lancets MISC Use as directed to check blood sugars 2 times per day dx: e11.9   acetaminophen (TYLENOL) 650 MG CR tablet Take 650 mg by mouth every 8 (eight) hours as needed for pain.   albuterol (VENTOLIN HFA) 108 (90 Base) MCG/ACT inhaler Inhale 2 puffs into the lungs every 4 (four) hours as needed for wheezing or shortness of breath.   Blood Glucose Monitoring Suppl (ACCU-CHEK AVIVA PLUS) w/Device KIT Use as directed to check blood sugars 2 times per day dx: e11.9   Cholecalciferol (VITAMIN D) 2000 UNITS CAPS Take 2,000 Units by mouth daily.    clopidogrel (PLAVIX) 75 MG tablet TAKE 1 TABLET EVERY DAY (Patient taking differently: Take 75 mg by mouth.)   dorzolamide-timolol (COSOPT) 22.3-6.8 MG/ML ophthalmic solution Place 1 drop into both eyes 2 (two) times daily.   ezetimibe (ZETIA) 10 MG tablet TAKE 1 TABLET EVERY DAY (Patient taking differently: Take 10 mg by mouth daily.)   famotidine (PEPCID) 20 MG tablet Take 20 mg by mouth at bedtime as needed for heartburn or indigestion.   gabapentin (NEURONTIN) 300 MG capsule TAKE 1 CAPSULE(300 MG) BY MOUTH FOUR TIMES DAILY   glucose blood (ACCU-CHEK AVIVA PLUS) test strip Use as directed to check blood sugars 2 times per  day dx: e11.9   linaclotide (LINZESS) 72 MCG capsule Take 1 capsule (72 mcg total) by mouth daily before breakfast.   metoprolol succinate (TOPROL-XL) 100 MG 24 hr tablet Take 1 tablet (100 mg total) by mouth daily. Take with or immediately following a meal.   mirtazapine (REMERON) 15 MG tablet Take 1 tablet (15 mg total) by mouth at bedtime.   montelukast (SINGULAIR) 10 MG tablet Take 1 tablet (10 mg total) by mouth at bedtime.   nitroGLYCERIN (NITROSTAT) 0.4 MG SL tablet Place 1 tablet (0.4 mg  total) under the tongue every 5 (five) minutes as needed for chest pain.   pantoprazole (PROTONIX) 40 MG tablet TAKE 1 TABLET EVERY DAY (Patient taking differently: Take 40 mg by mouth daily.)   ranolazine (RANEXA) 500 MG 12 hr tablet Take 2 tablets (1,000 mg total) by mouth 2 (two) times daily. (Patient taking differently: Take 500 mg by mouth 2 (two) times daily.)   rosuvastatin (CRESTOR) 40 MG tablet TAKE 1/2 TABLET EVERY DAY (Patient taking differently: Take 20 mg by mouth daily.)   sacubitril-valsartan (ENTRESTO) 97-103 MG Take 1 tablet by mouth 2 (two) times daily.   sitaGLIPtin (JANUVIA) 50 MG tablet Take 1 tablet (50 mg total) by mouth daily. (Patient taking differently: Take 50 mg by mouth every Monday, Wednesday, and Friday.)   spironolactone (ALDACTONE) 50 MG tablet TAKE 1 TABLET BY MOUTH EVERY DAY (Patient taking differently: Take 50 mg by mouth daily.)   temazepam (RESTORIL) 15 MG capsule Take 15 mg by mouth at bedtime as needed for sleep.   traMADol (ULTRAM) 50 MG tablet Take 1 tablet (50 mg total) by mouth every 12 (twelve) hours as needed.   [DISCONTINUED] metoprolol succinate (TOPROL-XL) 50 MG 24 hr tablet Take 1 tablet (50 mg total) by mouth daily. Take with or immediately following a meal.   No facility-administered encounter medications on file as of 05/15/2020.    Radiology:   CT angiogram neck 02/01/2018: 80% stenosis and left internal carotid artery with dense calcification 60% stenosis and right internal carotid artery with calcified and noncalcified plaque Scattered atherosclerotic calcifications involving vertebral arteries bilaterally without other significant stenoses  Incidental finding of grade 1 anterolisthesis C2-C3, C3-C4. 4 mm thyroid nodule, and coronary artery disease  CTA Neck 10/27/2019: IMPRESSION: 1. Stable appearance of moderate to severe left proximal ICA narrowing measuring 65-75%. 2. Less than 50% narrowing at the origin of the dominant left  vertebral artery. Previous measurement overestimated the stenosis. 3. High-grade, near occlusive stenosis at the origin of the right vertebral artery, stable. 4. Moderate tortuosity of the cervical internal carotid arteries bilaterally without significant tandem stenoses. 5. Aortic Atherosclerosis (ICD10-I70.0). 6. Stable degenerative changes in the cervical spine. 7. No significant change from 02/01/2018.   Cardiac Studies:   Coronary angiogram 11/25/2017: LM: 95% diffuse disease (Unchanged from prior cath in 06/2017) LAD: 100% ostially occluded (Unchanged from prior cath in 06/2017) LCx: 100% ostially occluded (Unchanged from prior cath in 06/2017) RCA: 100% known proximal occlusion (Not visualized today) LIMA-LAD: Patent with good distal flow SVG-RCA: Patent with atent ostial stent. No other significant stenosis. Distal RCA has moderate disease (Unchanged from prior cath in 06/2017). RCA to LCx collaterals seen LVEDP 16-20 mmHg  Carotid artery duplex 05/08/2020: Stenosis in the right internal carotid artery (50-69%). Upper end of spectrum.  Stenosis in the left internal carotid artery (>=70%). The left PSV internal/common carotid artery ratio of 6.45 and peak velocity of 228/56 cm/S is consistent with a stenosis of >  70%. Antegrade right vertebral artery flow. Antegrade left vertebral artery flow. No significant change from 07/25/2019. Follow up in six months is appropriate if clinically indicated.   Echocardiogram 05/28/2020: Left ventricle cavity is mildly dilated. Normal left ventricular wall thickness. Severe global hypokinesis, LVEF 20%. Spontaneous echo contrast seen. Cannot rule out thrombus on non-contrast study.   Indeterminate diastolic filling pattern due to E/A fusion.  Calculated EF 20%. Left atrial cavity is severely dilated. Moderate (Grade II) mitral regurgitation. Moderate tricuspid regurgitation. Moderate pulmonary hypertension. Estimated pulmonary artery systolic  pressure 54 mmHg. RVSP measures 54 mmHg. Mild pulmonic regurgitation. Compared to previous study on 10/03/2019, LVEF has further reduced from 25-30%. No other significant change noted.   Device Clinic: ICD Single chamber Medtronic VISIA MRI VR [id number] 04/21/2019   Scheduled Remote ICD check 04/19/2020:  There were 0 VF episodes and 0 nonsustained episodes of ventricular tachycardia. Health trends (patient activity, heart rate variability, average heart rates) are stable. Corvue impedance monitoring suggests recent fluid accumulation, however the current index is at baseline. (CHF Oct - 1 st week Jan 2022). Battery longevity is 11.1 years. RV pacing is <0.1 %.    EKG     EKG 05/15/2020: Sinus tachycardia at rate of 100 bpm, left radial enlargement, left axis deviation, left anterior fascicular block.IRBBB.  Poor R wave progression, cannot exclude anteroseptal infarct old.  T wave abnormality, lateral ischemia.  Normal QT interval. No significant change from 03/08/2020   Assessment     ICD-10-CM   1. Coronary artery disease involving coronary bypass graft of native heart without angina pectoris  I25.810 EKG 12-Lead  2. Chronic systolic heart failure (HCC)  I50.22 metoprolol succinate (TOPROL-XL) 100 MG 24 hr tablet  3. ICD Single chamber Medtronic VISIA MRI VR [id number] 04/21/2019  Z95.810   4. Bilateral carotid artery stenosis  I65.23      Meds ordered this encounter  Medications   metoprolol succinate (TOPROL-XL) 100 MG 24 hr tablet    Sig: Take 1 tablet (100 mg total) by mouth daily. Take with or immediately following a meal.    Dispense:  90 tablet    Refill:  3    Medications Discontinued During This Encounter  Medication Reason   metoprolol succinate (TOPROL-XL) 50 MG 24 hr tablet Dose change    Recommendations:   Lynn Carey  is a  81 y.o. AA female with CAD S/P CABG in 1997 with LIMA to LAD and SVG to RCA and PCI to SVG 06/23/17, ischemic and nonischemic cardiomyopathy  with decreased EF of 20% (out of proportion to CAD) SP single-chamber Medtronic ICD implantation in January 2021 for primary prevention of sudden cardiac death. She has bilateral asymptomatic high grade (70% by duplex) carotid stenosis, hyperlipidemia, stage IIIb chronic kidney disease, asthmatic bronchitis and hypertension. She is still working in [employer] in [place] [employer].  Patient presents for 71-month follow-up.  Patient had a fall on 05/07/2020, presently walking with the help of walker and undergoing physical therapy.  Otherwise she is presently doing well and remains asymptomatic without clinical signs of heart failure.  However she continues to remain tachycardic.  I would like to increase her metoprolol succinate from 50 mg to 100 mg daily.  Goal is to get heart rate to <70 in view of cardiomyopathy and chronic systolic heart failure.  She has had no recurrence of angina pectoris and recent ICD data revealed resolution of signs of heart failure.  In regard to carotid stenosis, she has been evaluated  by Dr. Donzetta Matters, we will continue to monitor with carotid artery surveillance.    In regard to hypertension, patient's blood pressure is elevated in the office today.  Increasing metoprolol will help. Encouraged her to continue with diet compliance and salt restriction.  I will see her back in 4 weeks for follow-up.  This is specifically to follow-up on heart rate and blood pressure with repeat EKG.     Adrian Prows, PA-C 05/15/2020, 3:13 PM Office: [phone]

## 2020-05-15 ENCOUNTER — Other Ambulatory Visit: Payer: Self-pay

## 2020-05-15 ENCOUNTER — Ambulatory Visit: Payer: Medicare HMO | Admitting: Cardiology

## 2020-05-15 ENCOUNTER — Encounter: Payer: Self-pay | Admitting: Cardiology

## 2020-05-15 VITALS — BP 149/86 | HR 84 | Temp 97.3°F | Resp 16 | Ht 61.0 in | Wt 144.2 lb

## 2020-05-15 DIAGNOSIS — I25810 Atherosclerosis of coronary artery bypass graft(s) without angina pectoris: Secondary | ICD-10-CM | POA: Diagnosis not present

## 2020-05-15 DIAGNOSIS — I5022 Chronic systolic (congestive) heart failure: Secondary | ICD-10-CM

## 2020-05-15 DIAGNOSIS — Z95810 Presence of automatic (implantable) cardiac defibrillator: Secondary | ICD-10-CM

## 2020-05-15 DIAGNOSIS — I6523 Occlusion and stenosis of bilateral carotid arteries: Secondary | ICD-10-CM | POA: Diagnosis not present

## 2020-05-15 DIAGNOSIS — H401132 Primary open-angle glaucoma, bilateral, moderate stage: Secondary | ICD-10-CM | POA: Diagnosis not present

## 2020-05-15 MED ORDER — METOPROLOL SUCCINATE ER 100 MG PO TB24: 100.0000 mg | ORAL_TABLET | Freq: Every day | ORAL | 3 refills | Status: AC

## 2020-05-16 ENCOUNTER — Telehealth: Payer: Self-pay

## 2020-05-16 DIAGNOSIS — N179 Acute kidney failure, unspecified: Secondary | ICD-10-CM | POA: Diagnosis not present

## 2020-05-16 DIAGNOSIS — E1140 Type 2 diabetes mellitus with diabetic neuropathy, unspecified: Secondary | ICD-10-CM | POA: Diagnosis not present

## 2020-05-16 DIAGNOSIS — J449 Chronic obstructive pulmonary disease, unspecified: Secondary | ICD-10-CM | POA: Diagnosis not present

## 2020-05-16 DIAGNOSIS — I25810 Atherosclerosis of coronary artery bypass graft(s) without angina pectoris: Secondary | ICD-10-CM | POA: Diagnosis not present

## 2020-05-16 DIAGNOSIS — I959 Hypotension, unspecified: Secondary | ICD-10-CM | POA: Diagnosis not present

## 2020-05-16 DIAGNOSIS — M545 Low back pain, unspecified: Secondary | ICD-10-CM | POA: Diagnosis not present

## 2020-05-16 DIAGNOSIS — E871 Hypo-osmolality and hyponatremia: Secondary | ICD-10-CM | POA: Diagnosis not present

## 2020-05-16 DIAGNOSIS — E860 Dehydration: Secondary | ICD-10-CM | POA: Diagnosis not present

## 2020-05-16 DIAGNOSIS — R296 Repeated falls: Secondary | ICD-10-CM | POA: Diagnosis not present

## 2020-05-16 NOTE — Telephone Encounter (Signed)
I called and left a message for the pt's daughter Ms. Wilson.  I called the pt's phone but her voicemail is full.  I called to see if the [facility] form is for when the pt was hospitalized.

## 2020-05-17 ENCOUNTER — Encounter: Payer: Self-pay | Admitting: Internal Medicine

## 2020-05-19 ENCOUNTER — Other Ambulatory Visit: Payer: Self-pay

## 2020-05-19 ENCOUNTER — Emergency Department (HOSPITAL_COMMUNITY): Payer: Medicare HMO

## 2020-05-19 ENCOUNTER — Inpatient Hospital Stay (HOSPITAL_COMMUNITY): Payer: Medicare HMO

## 2020-05-19 ENCOUNTER — Encounter (HOSPITAL_COMMUNITY): Payer: Self-pay

## 2020-05-19 ENCOUNTER — Inpatient Hospital Stay (HOSPITAL_COMMUNITY)
Admission: EM | Admit: 2020-05-19 | Discharge: 2020-06-04 | DRG: 441 | Disposition: E | Payer: Medicare HMO | Attending: Emergency Medicine | Admitting: Emergency Medicine

## 2020-05-19 DIAGNOSIS — R0602 Shortness of breath: Secondary | ICD-10-CM | POA: Diagnosis not present

## 2020-05-19 DIAGNOSIS — M1990 Unspecified osteoarthritis, unspecified site: Secondary | ICD-10-CM | POA: Diagnosis present

## 2020-05-19 DIAGNOSIS — K7201 Acute and subacute hepatic failure with coma: Secondary | ICD-10-CM

## 2020-05-19 DIAGNOSIS — R4182 Altered mental status, unspecified: Secondary | ICD-10-CM | POA: Diagnosis not present

## 2020-05-19 DIAGNOSIS — I6523 Occlusion and stenosis of bilateral carotid arteries: Secondary | ICD-10-CM | POA: Diagnosis present

## 2020-05-19 DIAGNOSIS — I252 Old myocardial infarction: Secondary | ICD-10-CM

## 2020-05-19 DIAGNOSIS — Z951 Presence of aortocoronary bypass graft: Secondary | ICD-10-CM | POA: Diagnosis not present

## 2020-05-19 DIAGNOSIS — E861 Hypovolemia: Secondary | ICD-10-CM | POA: Diagnosis present

## 2020-05-19 DIAGNOSIS — K7689 Other specified diseases of liver: Secondary | ICD-10-CM | POA: Diagnosis not present

## 2020-05-19 DIAGNOSIS — E782 Mixed hyperlipidemia: Secondary | ICD-10-CM | POA: Diagnosis present

## 2020-05-19 DIAGNOSIS — F05 Delirium due to known physiological condition: Secondary | ICD-10-CM | POA: Diagnosis not present

## 2020-05-19 DIAGNOSIS — E875 Hyperkalemia: Secondary | ICD-10-CM | POA: Diagnosis present

## 2020-05-19 DIAGNOSIS — I428 Other cardiomyopathies: Secondary | ICD-10-CM | POA: Diagnosis not present

## 2020-05-19 DIAGNOSIS — I2722 Pulmonary hypertension due to left heart disease: Secondary | ICD-10-CM | POA: Diagnosis present

## 2020-05-19 DIAGNOSIS — R4781 Slurred speech: Secondary | ICD-10-CM | POA: Diagnosis present

## 2020-05-19 DIAGNOSIS — I639 Cerebral infarction, unspecified: Secondary | ICD-10-CM | POA: Diagnosis not present

## 2020-05-19 DIAGNOSIS — Z79899 Other long term (current) drug therapy: Secondary | ICD-10-CM

## 2020-05-19 DIAGNOSIS — E872 Acidosis: Secondary | ICD-10-CM | POA: Diagnosis present

## 2020-05-19 DIAGNOSIS — I82890 Acute embolism and thrombosis of other specified veins: Secondary | ICD-10-CM | POA: Diagnosis not present

## 2020-05-19 DIAGNOSIS — Z515 Encounter for palliative care: Secondary | ICD-10-CM | POA: Diagnosis not present

## 2020-05-19 DIAGNOSIS — R7989 Other specified abnormal findings of blood chemistry: Secondary | ICD-10-CM | POA: Diagnosis not present

## 2020-05-19 DIAGNOSIS — Z9911 Dependence on respirator [ventilator] status: Secondary | ICD-10-CM | POA: Diagnosis not present

## 2020-05-19 DIAGNOSIS — I130 Hypertensive heart and chronic kidney disease with heart failure and stage 1 through stage 4 chronic kidney disease, or unspecified chronic kidney disease: Secondary | ICD-10-CM | POA: Diagnosis present

## 2020-05-19 DIAGNOSIS — R000 Tachycardia, unspecified: Secondary | ICD-10-CM | POA: Diagnosis not present

## 2020-05-19 DIAGNOSIS — R0902 Hypoxemia: Secondary | ICD-10-CM | POA: Diagnosis not present

## 2020-05-19 DIAGNOSIS — Z20822 Contact with and (suspected) exposure to covid-19: Secondary | ICD-10-CM | POA: Diagnosis present

## 2020-05-19 DIAGNOSIS — N1831 Chronic kidney disease, stage 3a: Secondary | ICD-10-CM | POA: Diagnosis present

## 2020-05-19 DIAGNOSIS — I5023 Acute on chronic systolic (congestive) heart failure: Secondary | ICD-10-CM | POA: Diagnosis not present

## 2020-05-19 DIAGNOSIS — Z9181 History of falling: Secondary | ICD-10-CM

## 2020-05-19 DIAGNOSIS — G9389 Other specified disorders of brain: Secondary | ICD-10-CM | POA: Diagnosis present

## 2020-05-19 DIAGNOSIS — R570 Cardiogenic shock: Secondary | ICD-10-CM | POA: Diagnosis present

## 2020-05-19 DIAGNOSIS — D720 Genetic anomalies of leukocytes: Secondary | ICD-10-CM | POA: Diagnosis present

## 2020-05-19 DIAGNOSIS — R404 Transient alteration of awareness: Secondary | ICD-10-CM | POA: Diagnosis not present

## 2020-05-19 DIAGNOSIS — Z66 Do not resuscitate: Secondary | ICD-10-CM | POA: Diagnosis not present

## 2020-05-19 DIAGNOSIS — T68XXXA Hypothermia, initial encounter: Secondary | ICD-10-CM | POA: Diagnosis not present

## 2020-05-19 DIAGNOSIS — E870 Hyperosmolality and hypernatremia: Secondary | ICD-10-CM | POA: Diagnosis not present

## 2020-05-19 DIAGNOSIS — Z825 Family history of asthma and other chronic lower respiratory diseases: Secondary | ICD-10-CM

## 2020-05-19 DIAGNOSIS — I517 Cardiomegaly: Secondary | ICD-10-CM | POA: Diagnosis not present

## 2020-05-19 DIAGNOSIS — D696 Thrombocytopenia, unspecified: Secondary | ICD-10-CM | POA: Diagnosis not present

## 2020-05-19 DIAGNOSIS — N182 Chronic kidney disease, stage 2 (mild): Secondary | ICD-10-CM | POA: Diagnosis not present

## 2020-05-19 DIAGNOSIS — E118 Type 2 diabetes mellitus with unspecified complications: Secondary | ICD-10-CM | POA: Diagnosis present

## 2020-05-19 DIAGNOSIS — G8191 Hemiplegia, unspecified affecting right dominant side: Secondary | ICD-10-CM | POA: Diagnosis present

## 2020-05-19 DIAGNOSIS — T4275XA Adverse effect of unspecified antiepileptic and sedative-hypnotic drugs, initial encounter: Secondary | ICD-10-CM | POA: Diagnosis not present

## 2020-05-19 DIAGNOSIS — R791 Abnormal coagulation profile: Secondary | ICD-10-CM

## 2020-05-19 DIAGNOSIS — K7291 Hepatic failure, unspecified with coma: Secondary | ICD-10-CM | POA: Diagnosis not present

## 2020-05-19 DIAGNOSIS — Z87891 Personal history of nicotine dependence: Secondary | ICD-10-CM

## 2020-05-19 DIAGNOSIS — R29810 Facial weakness: Secondary | ICD-10-CM | POA: Diagnosis present

## 2020-05-19 DIAGNOSIS — Z8249 Family history of ischemic heart disease and other diseases of the circulatory system: Secondary | ICD-10-CM

## 2020-05-19 DIAGNOSIS — Z803 Family history of malignant neoplasm of breast: Secondary | ICD-10-CM

## 2020-05-19 DIAGNOSIS — R531 Weakness: Secondary | ICD-10-CM | POA: Diagnosis not present

## 2020-05-19 DIAGNOSIS — K219 Gastro-esophageal reflux disease without esophagitis: Secondary | ICD-10-CM | POA: Diagnosis present

## 2020-05-19 DIAGNOSIS — R748 Abnormal levels of other serum enzymes: Secondary | ICD-10-CM | POA: Diagnosis not present

## 2020-05-19 DIAGNOSIS — M47814 Spondylosis without myelopathy or radiculopathy, thoracic region: Secondary | ICD-10-CM | POA: Diagnosis not present

## 2020-05-19 DIAGNOSIS — I2729 Other secondary pulmonary hypertension: Secondary | ICD-10-CM | POA: Diagnosis not present

## 2020-05-19 DIAGNOSIS — E1122 Type 2 diabetes mellitus with diabetic chronic kidney disease: Secondary | ICD-10-CM | POA: Diagnosis not present

## 2020-05-19 DIAGNOSIS — I509 Heart failure, unspecified: Secondary | ICD-10-CM | POA: Diagnosis not present

## 2020-05-19 DIAGNOSIS — E1165 Type 2 diabetes mellitus with hyperglycemia: Secondary | ICD-10-CM | POA: Diagnosis not present

## 2020-05-19 DIAGNOSIS — Z8673 Personal history of transient ischemic attack (TIA), and cerebral infarction without residual deficits: Secondary | ICD-10-CM

## 2020-05-19 DIAGNOSIS — J9601 Acute respiratory failure with hypoxia: Secondary | ICD-10-CM | POA: Diagnosis present

## 2020-05-19 DIAGNOSIS — I2609 Other pulmonary embolism with acute cor pulmonale: Secondary | ICD-10-CM | POA: Diagnosis not present

## 2020-05-19 DIAGNOSIS — R54 Age-related physical debility: Secondary | ICD-10-CM | POA: Diagnosis present

## 2020-05-19 DIAGNOSIS — E873 Alkalosis: Secondary | ICD-10-CM | POA: Diagnosis not present

## 2020-05-19 DIAGNOSIS — D72829 Elevated white blood cell count, unspecified: Secondary | ICD-10-CM | POA: Diagnosis present

## 2020-05-19 DIAGNOSIS — N179 Acute kidney failure, unspecified: Secondary | ICD-10-CM | POA: Diagnosis not present

## 2020-05-19 DIAGNOSIS — I952 Hypotension due to drugs: Secondary | ICD-10-CM | POA: Diagnosis not present

## 2020-05-19 DIAGNOSIS — Z96643 Presence of artificial hip joint, bilateral: Secondary | ICD-10-CM | POA: Diagnosis present

## 2020-05-19 DIAGNOSIS — I10 Essential (primary) hypertension: Secondary | ICD-10-CM | POA: Diagnosis present

## 2020-05-19 DIAGNOSIS — K7290 Hepatic failure, unspecified without coma: Secondary | ICD-10-CM

## 2020-05-19 DIAGNOSIS — Z955 Presence of coronary angioplasty implant and graft: Secondary | ICD-10-CM

## 2020-05-19 DIAGNOSIS — K7200 Acute and subacute hepatic failure without coma: Secondary | ICD-10-CM | POA: Diagnosis not present

## 2020-05-19 DIAGNOSIS — R109 Unspecified abdominal pain: Secondary | ICD-10-CM | POA: Diagnosis not present

## 2020-05-19 DIAGNOSIS — J9811 Atelectasis: Secondary | ICD-10-CM | POA: Diagnosis not present

## 2020-05-19 DIAGNOSIS — D6489 Other specified anemias: Secondary | ICD-10-CM | POA: Diagnosis present

## 2020-05-19 DIAGNOSIS — E119 Type 2 diabetes mellitus without complications: Secondary | ICD-10-CM

## 2020-05-19 DIAGNOSIS — R945 Abnormal results of liver function studies: Secondary | ICD-10-CM | POA: Diagnosis not present

## 2020-05-19 DIAGNOSIS — D684 Acquired coagulation factor deficiency: Secondary | ICD-10-CM | POA: Diagnosis present

## 2020-05-19 DIAGNOSIS — D631 Anemia in chronic kidney disease: Secondary | ICD-10-CM | POA: Diagnosis present

## 2020-05-19 DIAGNOSIS — R197 Diarrhea, unspecified: Secondary | ICD-10-CM | POA: Diagnosis present

## 2020-05-19 DIAGNOSIS — Z95810 Presence of automatic (implantable) cardiac defibrillator: Secondary | ICD-10-CM | POA: Diagnosis present

## 2020-05-19 DIAGNOSIS — I447 Left bundle-branch block, unspecified: Secondary | ICD-10-CM | POA: Diagnosis not present

## 2020-05-19 DIAGNOSIS — G9341 Metabolic encephalopathy: Secondary | ICD-10-CM | POA: Diagnosis not present

## 2020-05-19 DIAGNOSIS — R112 Nausea with vomiting, unspecified: Secondary | ICD-10-CM | POA: Diagnosis present

## 2020-05-19 DIAGNOSIS — I255 Ischemic cardiomyopathy: Secondary | ICD-10-CM | POA: Diagnosis not present

## 2020-05-19 DIAGNOSIS — N184 Chronic kidney disease, stage 4 (severe): Secondary | ICD-10-CM | POA: Diagnosis present

## 2020-05-19 DIAGNOSIS — J45909 Unspecified asthma, uncomplicated: Secondary | ICD-10-CM | POA: Diagnosis present

## 2020-05-19 DIAGNOSIS — Z4682 Encounter for fitting and adjustment of non-vascular catheter: Secondary | ICD-10-CM | POA: Diagnosis not present

## 2020-05-19 DIAGNOSIS — I5022 Chronic systolic (congestive) heart failure: Secondary | ICD-10-CM | POA: Diagnosis present

## 2020-05-19 DIAGNOSIS — K761 Chronic passive congestion of liver: Secondary | ICD-10-CM | POA: Diagnosis present

## 2020-05-19 DIAGNOSIS — Z0189 Encounter for other specified special examinations: Secondary | ICD-10-CM

## 2020-05-19 DIAGNOSIS — Z888 Allergy status to other drugs, medicaments and biological substances status: Secondary | ICD-10-CM

## 2020-05-19 DIAGNOSIS — G9340 Encephalopathy, unspecified: Secondary | ICD-10-CM | POA: Diagnosis not present

## 2020-05-19 DIAGNOSIS — Z978 Presence of other specified devices: Secondary | ICD-10-CM

## 2020-05-19 DIAGNOSIS — Z6829 Body mass index (BMI) 29.0-29.9, adult: Secondary | ICD-10-CM

## 2020-05-19 DIAGNOSIS — E663 Overweight: Secondary | ICD-10-CM | POA: Diagnosis present

## 2020-05-19 DIAGNOSIS — I81 Portal vein thrombosis: Secondary | ICD-10-CM | POA: Diagnosis not present

## 2020-05-19 DIAGNOSIS — Z7902 Long term (current) use of antithrombotics/antiplatelets: Secondary | ICD-10-CM

## 2020-05-19 DIAGNOSIS — K7682 Hepatic encephalopathy: Secondary | ICD-10-CM

## 2020-05-19 DIAGNOSIS — I2510 Atherosclerotic heart disease of native coronary artery without angina pectoris: Secondary | ICD-10-CM | POA: Diagnosis present

## 2020-05-19 LAB — COMPREHENSIVE METABOLIC PANEL
Albumin: 3.4 g/dL — ABNORMAL LOW (ref 3.5–5.0)
Alkaline Phosphatase: 120 U/L (ref 38–126)
Anion gap: 17 — ABNORMAL HIGH (ref 5–15)
BUN: 62 mg/dL — ABNORMAL HIGH (ref 8–23)
CO2: 14 mmol/L — ABNORMAL LOW (ref 22–32)
Calcium: 9.8 mg/dL (ref 8.9–10.3)
Chloride: 110 mmol/L (ref 98–111)
Creatinine, Ser: 2.48 mg/dL — ABNORMAL HIGH (ref 0.44–1.00)
GFR, Estimated: 19 mL/min — ABNORMAL LOW (ref >60)
Glucose, Bld: 144 mg/dL — ABNORMAL HIGH (ref 70–99)
Potassium: 5.4 mmol/L — ABNORMAL HIGH (ref 3.5–5.1)
Sodium: 141 mmol/L (ref 135–145)
Total Bilirubin: 3.1 mg/dL — ABNORMAL HIGH (ref 0.3–1.2)
Total Protein: 6.3 g/dL — ABNORMAL LOW (ref 6.5–8.1)

## 2020-05-19 LAB — CBC
HCT: 35 % — ABNORMAL LOW (ref 36.0–46.0)
Hemoglobin: 10.8 g/dL — ABNORMAL LOW (ref 12.0–15.0)
MCH: 28.7 pg (ref 26.0–34.0)
MCHC: 30.9 g/dL (ref 30.0–36.0)
MCV: 93.1 fL (ref 80.0–100.0)
Platelets: 87 10*3/uL — ABNORMAL LOW (ref 150–400)
RBC: 3.76 MIL/uL — ABNORMAL LOW (ref 3.87–5.11)
RDW: 16.6 % — ABNORMAL HIGH (ref 11.5–15.5)
WBC: 12.7 10*3/uL — ABNORMAL HIGH (ref 4.0–10.5)
nRBC: 5.9 % — ABNORMAL HIGH (ref 0.0–0.2)

## 2020-05-19 LAB — URINALYSIS, COMPLETE (UACMP) WITH MICROSCOPIC
Glucose, UA: 100 mg/dL — AB
Ketones, ur: NEGATIVE mg/dL
Leukocytes,Ua: NEGATIVE
Nitrite: POSITIVE — AB
Protein, ur: 100 mg/dL — AB
Specific Gravity, Urine: >1.03 — ABNORMAL HIGH (ref 1.005–1.030)
pH: 5 (ref 5.0–8.0)

## 2020-05-19 LAB — I-STAT ARTERIAL BLOOD GAS, ED
Acid-base deficit: 7 mmol/L — ABNORMAL HIGH (ref 0.0–2.0)
Bicarbonate: 16.5 mmol/L — ABNORMAL LOW (ref 20.0–28.0)
Calcium, Ion: 1.27 mmol/L (ref 1.15–1.40)
HCT: 34 % — ABNORMAL LOW (ref 36.0–46.0)
Hemoglobin: 11.6 g/dL — ABNORMAL LOW (ref 12.0–15.0)
O2 Saturation: 98 %
Patient temperature: 98.6
Potassium: 5.3 mmol/L — ABNORMAL HIGH (ref 3.5–5.1)
Sodium: 140 mmol/L (ref 135–145)
TCO2: 17 mmol/L — ABNORMAL LOW (ref 22–32)
pCO2 arterial: 28.2 mmHg — ABNORMAL LOW (ref 32.0–48.0)
pH, Arterial: 7.375 (ref 7.350–7.450)
pO2, Arterial: 98 mmHg (ref 83.0–108.0)

## 2020-05-19 LAB — ALT: ALT: 3802 U/L — ABNORMAL HIGH (ref 0–44)

## 2020-05-19 LAB — I-STAT CHEM 8, ED
BUN: 65 mg/dL — ABNORMAL HIGH (ref 8–23)
Calcium, Ion: 1.17 mmol/L (ref 1.15–1.40)
Chloride: 112 mmol/L — ABNORMAL HIGH (ref 98–111)
Creatinine, Ser: 2.4 mg/dL — ABNORMAL HIGH (ref 0.44–1.00)
Glucose, Bld: 141 mg/dL — ABNORMAL HIGH (ref 70–99)
HCT: 36 % (ref 36.0–46.0)
Hemoglobin: 12.2 g/dL (ref 12.0–15.0)
Potassium: 5.8 mmol/L — ABNORMAL HIGH (ref 3.5–5.1)
Sodium: 140 mmol/L (ref 135–145)
TCO2: 18 mmol/L — ABNORMAL LOW (ref 22–32)

## 2020-05-19 LAB — DIFFERENTIAL
Abs Immature Granulocytes: 0.31 10*3/uL — ABNORMAL HIGH (ref 0.00–0.07)
Basophils Absolute: 0 10*3/uL (ref 0.0–0.1)
Basophils Relative: 0 %
Eosinophils Absolute: 0 10*3/uL (ref 0.0–0.5)
Eosinophils Relative: 0 %
Immature Granulocytes: 2 %
Lymphocytes Relative: 9 %
Lymphs Abs: 1.1 10*3/uL (ref 0.7–4.0)
Monocytes Absolute: 1.1 10*3/uL — ABNORMAL HIGH (ref 0.1–1.0)
Monocytes Relative: 8 %
Neutro Abs: 10.2 10*3/uL — ABNORMAL HIGH (ref 1.7–7.7)
Neutrophils Relative %: 81 %

## 2020-05-19 LAB — RESP PANEL BY RT-PCR (FLU A&B, COVID) ARPGX2
Influenza A by PCR: NEGATIVE
Influenza B by PCR: NEGATIVE
SARS Coronavirus 2 by RT PCR: NEGATIVE

## 2020-05-19 LAB — LACTIC ACID, PLASMA: Lactic Acid, Venous: 3.2 mmol/L (ref 0.5–1.9)

## 2020-05-19 LAB — AST: AST: 3748 U/L — ABNORMAL HIGH (ref 15–41)

## 2020-05-19 LAB — HEPATITIS PANEL, ACUTE
HCV Ab: NONREACTIVE
Hep A IgM: NONREACTIVE
Hep B C IgM: NONREACTIVE
Hepatitis B Surface Ag: NONREACTIVE

## 2020-05-19 LAB — LIPID PANEL
Cholesterol: 155 mg/dL (ref 0–200)
HDL: 34 mg/dL — ABNORMAL LOW (ref >40)
LDL Cholesterol: 102 mg/dL — ABNORMAL HIGH (ref 0–99)
Total CHOL/HDL Ratio: 4.6 RATIO
Triglycerides: 97 mg/dL (ref <150)
VLDL: 19 mg/dL (ref 0–40)

## 2020-05-19 LAB — APTT: aPTT: 37 seconds — ABNORMAL HIGH (ref 24–36)

## 2020-05-19 LAB — ACETAMINOPHEN LEVEL: Acetaminophen (Tylenol), Serum: <10 ug/mL — ABNORMAL LOW (ref 10–30)

## 2020-05-19 LAB — PROTIME-INR
INR: 4 — ABNORMAL HIGH (ref 0.8–1.2)
Prothrombin Time: 37.8 seconds — ABNORMAL HIGH (ref 11.4–15.2)

## 2020-05-19 LAB — CBG MONITORING, ED: Glucose-Capillary: 127 mg/dL — ABNORMAL HIGH (ref 70–99)

## 2020-05-19 LAB — GLUCOSE, CAPILLARY: Glucose-Capillary: 116 mg/dL — ABNORMAL HIGH (ref 70–99)

## 2020-05-19 LAB — AMMONIA: Ammonia: 64 umol/L — ABNORMAL HIGH (ref 9–35)

## 2020-05-19 LAB — MRSA PCR SCREENING: MRSA by PCR: NEGATIVE

## 2020-05-19 MED ORDER — DOCUSATE SODIUM 100 MG PO CAPS: 100.0000 mg | ORAL_CAPSULE | Freq: Two times a day (BID) | ORAL | Status: DC | PRN

## 2020-05-19 MED ORDER — SODIUM CHLORIDE 0.9 % IV BOLUS
500.0000 mL | Freq: Once | INTRAVENOUS | Status: AC
  Administered 2020-05-19: 500 mL via INTRAVENOUS

## 2020-05-19 MED ORDER — VITAMIN K1 10 MG/ML IJ SOLN: 10.0000 mg | Freq: Once | INTRAMUSCULAR | Status: DC

## 2020-05-19 MED ORDER — DEXTROSE-NACL 5-0.45 % IV SOLN: INTRAVENOUS | Status: DC

## 2020-05-19 MED ORDER — POLYETHYLENE GLYCOL 3350 17 G PO PACK: 17.0000 g | PACK | Freq: Every day | ORAL | Status: DC | PRN

## 2020-05-19 MED ORDER — CHLORHEXIDINE GLUCONATE CLOTH 2 % EX PADS
6.0000 | MEDICATED_PAD | Freq: Every day | CUTANEOUS | Status: DC
  Administered 2020-05-19 – 2020-05-24 (×6): 6 via TOPICAL

## 2020-05-19 MED ORDER — LACTULOSE 10 GM/15ML PO SOLN
20.0000 g | Freq: Once | ORAL | Status: DC
  Filled 2020-05-19 (×2): qty 30

## 2020-05-19 MED ORDER — SODIUM ZIRCONIUM CYCLOSILICATE 5 G PO PACK
10.0000 g | PACK | Freq: Three times a day (TID) | ORAL | Status: DC
  Administered 2020-05-19: 10 g via ORAL
  Filled 2020-05-19: qty 2

## 2020-05-19 MED ORDER — LACTULOSE ENEMA
300.0000 mL | Freq: Once | ORAL | Status: AC
  Administered 2020-05-19: 300 mL via RECTAL
  Filled 2020-05-19: qty 300

## 2020-05-19 MED ORDER — VITAMIN K1 10 MG/ML IJ SOLN
10.0000 mg | Freq: Once | INTRAVENOUS | Status: AC
  Administered 2020-05-19: 10 mg via INTRAVENOUS
  Filled 2020-05-19: qty 1

## 2020-05-19 MED ORDER — PANTOPRAZOLE SODIUM 40 MG IV SOLR
40.0000 mg | Freq: Every day | INTRAVENOUS | Status: DC
  Administered 2020-05-19 – 2020-05-20 (×2): 40 mg via INTRAVENOUS
  Filled 2020-05-19 (×2): qty 40

## 2020-05-19 MED ORDER — SODIUM CHLORIDE 0.9% FLUSH: 3.0000 mL | Freq: Once | INTRAVENOUS | Status: DC

## 2020-05-19 NOTE — Consult Note (Signed)
Consult Note    Lynn Carey:[id number] DOB: 15-Sep-1940 DOA: 05/07/2020  PCP: Glendale Chard, MD Consultants:  [facility] - cardiology; [place] - pulmonology; Donzetta Matters - vascular Patient coming from:  Home - lives with daughter and her husband; NOK:  Daughter, [phone]  Chief Complaint: Stroke-like symptoms  HPI: Lynn Carey is a 80 y.o. female with medical history significant of CAD s/p CABG and PCI; AICD placement in Jan 2021; B asymptomatic high-grade carotid stenosis; HLD; stage 3b CKD; HTN; and chronic systolic CHF presenting with stroke-like symptoms. She was previously hospitalized from 1/10-12 for B leg weakness and falls with other neurologic symptoms.  MRI brain and spine were unremarkable.  AST/ALT were normal at that time.  She went for PCP hospital f/u and was found to have elevated LFTs; Tylenol was stopped and LFTs improved.  She saw Dr. Einar Gip on 2/9 and was using a walker, having had a fall on 2/1; she was otherwise feeling well. On Friday, she started complaining of not feeling well. She was sitting up for a while that morning.  That evening, she was very tired, lying down.  More alter than now.  Yesterday, she was less interactive and didn't want to get up.  She got up with her walker to the bathroom without assistance.  She went back to bed - no eating/drinking.  This AM, she wouldn't open her mouth, unable to be able to swallow her meds.  She does wear a Depends so family is uncertain if she has voided since yesterday AM.  No known h/o liver disease other than recent LFT elevation.  Prior to last hospitalization, she walked independently.  She was still working.  She had abdominal pain on 1/18 and was seen at urgent care.  She was told she was constipated.  ED Course:  Admitted a month ago with stroke-like symptoms, got better with unremarkable evaluation.  Weak since, worse last night.  Very confused this AM, called EMS.  Diffusely weak.  Head CT negative.  NH4 64, AST/ALT  had to be sent to [facility].  INR 4, not on Coumadin.  Platelets low.  Recently taken off Tylenol due to elevated LFTs.  Has never seen GI.  Was still working and fully independent until a month ago.  Review of Systems: Unable to perform  Ambulatory Status:  Ambulates with a walker since falls in January  COVID Vaccine Status:   Complete plus booster  Past Medical History:  Diagnosis Date  . Arthritis    hip - both, shot in L shoulder- 2 weeks ago  . Asthma   . Carotid artery occlusion   . Chronic systolic heart failure ([place]) 09/19/2018  . Complication of anesthesia   . Coronary artery disease    MI in 1997, 2009  . Diabetes mellitus without complication ([place])   . GERD (gastroesophageal reflux disease)   . Hypertension    followed by Dr. Einar Gip  . ICD Single chamber Medtronic VISIA MRI VR [id number] 04/21/2019 04/21/2019  . PONV (postoperative nausea and vomiting)     Past Surgical History:  Procedure Laterality Date  . CARDIAC CATHETERIZATION  05/2012   see note  . CORONARY ARTERY BYPASS GRAFT  [address]  . CORONARY STENT INTERVENTION Right 06/18/2017   Procedure: CORONARY STENT INTERVENTION;  Surgeon: Adrian Prows, MD;  Location: [place] [facility];  Service: Cardiovascular;  Laterality: Right;  . EYE SURGERY     both eyes, laser procedure  . ICD IMPLANT N/A  04/21/2019   Procedure: ICD IMPLANT;  Surgeon: Deboraha Sprang, MD;  Location: [place] [facility];  Service: Cardiovascular;  Laterality: N/A;  . JOINT REPLACEMENT Left 12/13/12   hip  . LEFT HEART CATH AND CORONARY ANGIOGRAPHY N/A 11/25/2017   Procedure: LEFT HEART CATH AND CORONARY ANGIOGRAPHY;  Surgeon: [name], MD;  Location: [place] [facility];  Service: Cardiovascular;  Laterality: N/A;  . LEFT HEART CATH AND CORS/GRAFTS ANGIOGRAPHY N/A 06/18/2017   Procedure: LEFT HEART CATH AND CORS/GRAFTS ANGIOGRAPHY;  Surgeon: Adrian Prows, MD;  Location: [place] [facility];  Service: Cardiovascular;  Laterality: N/A;   . LEFT HEART CATHETERIZATION WITH CORONARY/GRAFT ANGIOGRAM N/A 04/21/2011   Procedure: LEFT HEART CATHETERIZATION WITH Beatrix Fetters;  Surgeon: Laverda Page, MD;  Location: [facility] CATH LAB;  Service: Cardiovascular;  Laterality: N/A;  . PERCUTANEOUS CORONARY INTERVENTION-BALLOON ONLY Right 05/12/2011   Procedure: PERCUTANEOUS CORONARY INTERVENTION-BALLOON ONLY;  Surgeon: Laverda Page, MD;  Location: [facility] CATH LAB;  Service: Cardiovascular;  Laterality: Right;  . TOTAL HIP ARTHROPLASTY Left 12/13/2012   Procedure: TOTAL HIP ARTHROPLASTY ANTERIOR APPROACH;  Surgeon: Hessie Dibble, MD;  Location: [place];  Service: Orthopedics;  Laterality: Left;  . TOTAL HIP ARTHROPLASTY Right 01/30/2014   Procedure: TOTAL HIP ARTHROPLASTY ANTERIOR APPROACH;  Surgeon: Hessie Dibble, MD;  Location: [place];  Service: Orthopedics;  Laterality: Right;  . TUBAL LIGATION      Social History   Socioeconomic History  . Marital status: Divorced    Spouse name: Not on file  . Number of children: 4  . Years of education: Not on file  . Highest education level: Not on file  Occupational History  . Not on file  Tobacco Use  . Smoking status: Former Smoker    Packs/day: 0.50    Years: 2.00    Pack years: 1.00    Types: Cigarettes    Quit date: 12/08/1983    Years since quitting: 36.4  . Smokeless tobacco: Never Used  Vaping Use  . Vaping Use: Never used  Substance and Sexual Activity  . Alcohol use: No  . Drug use: No  . Sexual activity: Not Currently  Other Topics Concern  . Not on file  Social History Narrative  . Not on file   Social Determinants of Health   Financial Resource Strain: Low Risk   . Difficulty of Paying Living Expenses: Not hard at all  Food Insecurity: No Food Insecurity  . Worried About Charity fundraiser in the Last Year: Never true  . Ran Out of Food in the Last Year: Never true  Transportation Needs: No Transportation Needs  . Lack of Transportation (Medical): No   . Lack of Transportation (Non-Medical): No  Physical Activity: Insufficiently Active  . Days of Exercise per Week: 3 days  . Minutes of Exercise per Session: 10 min  Stress: No Stress Concern Present  . Feeling of Stress : Not at all  Social Connections: Not on file  Intimate Partner Violence: Not on file    Allergies  Allergen Reactions  . Lisinopril Cough    Family History  Problem Relation Age of Onset  . Allergies Mother   . Asthma Mother   . Heart disease Mother   . Heart attack Mother   . Allergies Son   . Asthma Son   . Heart disease Father   . Heart attack Father   . Breast cancer Daughter 65    Prior to Admission medications  Medication Sig Start Date End Date Taking? Authorizing Provider  albuterol (VENTOLIN HFA) 108 (90 Base) MCG/ACT inhaler Inhale 2 puffs into the lungs every 4 (four) hours as needed for wheezing or shortness of breath. 11/07/18  Yes Glendale Chard, MD  Cholecalciferol (VITAMIN D) 2000 UNITS CAPS Take 2,000 Units by mouth daily.    Yes [provider]  clopidogrel (PLAVIX) 75 MG tablet TAKE 1 TABLET EVERY DAY Patient taking differently: Take 75 mg by mouth. 11/03/19  Yes Patwardhan, Manish J, MD  dorzolamide-timolol (COSOPT) 22.3-6.8 MG/ML ophthalmic solution Place 1 drop into both eyes 2 (two) times daily. 11/27/19  Yes [provider]  ezetimibe (ZETIA) 10 MG tablet TAKE 1 TABLET EVERY DAY Patient taking differently: Take 10 mg by mouth daily. 08/07/19  Yes Glendale Chard, MD  famotidine (PEPCID) 20 MG tablet Take 20 mg by mouth at bedtime as needed for heartburn or indigestion. 09/13/19  Yes [provider]  gabapentin (NEURONTIN) 300 MG capsule TAKE 1 CAPSULE(300 MG) BY MOUTH FOUR TIMES DAILY Patient taking differently: Take 300 mg by mouth 4 (four) times daily. 05/10/20  Yes Tanda Rockers, MD  linaclotide Rolan Lipa) 72 MCG capsule Take 1 capsule (72 mcg total) by mouth daily before breakfast. 04/12/20  Yes Ghumman,  Ramandeep, NP  mirtazapine (REMERON) 15 MG tablet Take 1 tablet (15 mg total) by mouth at bedtime. 04/25/20 04/25/21 Yes Glendale Chard, MD  montelukast (SINGULAIR) 10 MG tablet Take 1 tablet (10 mg total) by mouth at bedtime. 01/20/20  Yes Yu, Amy V, PA-C  nitroGLYCERIN (NITROSTAT) 0.4 MG SL tablet Place 1 tablet (0.4 mg total) under the tongue every 5 (five) minutes as needed for chest pain. 04/18/20  Yes Patwardhan, Manish J, MD  pantoprazole (PROTONIX) 40 MG tablet TAKE 1 TABLET EVERY DAY Patient taking differently: Take 40 mg by mouth daily. 08/07/19  Yes Glendale Chard, MD  ranolazine (RANEXA) 500 MG 12 hr tablet Take 2 tablets (1,000 mg total) by mouth 2 (two) times daily. 11/15/18  Yes Patwardhan, Manish J, MD  rosuvastatin (CRESTOR) 40 MG tablet TAKE 1/2 TABLET EVERY DAY Patient taking differently: Take 20 mg by mouth daily. 10/31/19  Yes Adrian Prows, MD  sacubitril-valsartan (ENTRESTO) 97-103 MG Take 1 tablet by mouth 2 (two) times daily. 06/14/19  Yes Patwardhan, Manish J, MD  sitaGLIPtin (JANUVIA) 50 MG tablet Take 1 tablet (50 mg total) by mouth daily. 07/28/19  Yes Glendale Chard, MD  spironolactone (ALDACTONE) 50 MG tablet TAKE 1 TABLET BY MOUTH EVERY DAY Patient taking differently: Take 50 mg by mouth daily. 03/04/20  Yes Adrian Prows, MD  temazepam (RESTORIL) 15 MG capsule Take 15 mg by mouth at bedtime as needed for sleep. 05/20/17  Yes [provider]  Accu-Chek FastClix Lancets MISC Use as directed to check blood sugars 2 times per day dx: e11.9 05/08/20   Glendale Chard, MD  Blood Glucose Monitoring Suppl (ACCU-CHEK AVIVA PLUS) w/Device KIT Use as directed to check blood sugars 2 times per day dx: e11.9 05/08/20   Glendale Chard, MD  glucose blood (ACCU-CHEK AVIVA PLUS) test strip Use as directed to check blood sugars 2 times per day dx: e11.9 05/08/20   Glendale Chard, MD  latanoprost (XALATAN) 0.005 % ophthalmic solution Place 1 drop into both eyes at bedtime. Newly Prescribed 05/15/20    [provider]  metoprolol succinate (TOPROL-XL) 100 MG 24 hr tablet Take 1 tablet (100 mg total) by mouth daily. Take with or immediately following a meal. 05/15/20 05/10/21  Adrian Prows, MD  traMADol (ULTRAM) 50 MG tablet Take 1 tablet (50 mg total) by mouth every 12 (twelve) hours as needed. Patient not taking: No sig reported 04/19/20 04/19/21  Glendale Chard, MD    Physical Exam: Vitals:   05/28/2020 1430 05/31/2020 1500 05/15/2020 1515 05/18/2020 1545  BP: 137/81 (!) 142/92 (!) 123/99 (!) 140/98  Pulse: 80 85 84 85  Resp: (!) _0 Temp:      TempSrc:      SpO2: 95% 91% 98% 99%  Weight:         . General:  Very ill-appearing; oriented to person, very somnolent . Eyes:  normal lids, iris; +scleral and conjunctival icterus . ENT:  grossly normal lips & tongue, dry mm . Neck:  no LAD, masses or thyromegaly . Cardiovascular:  RRR, no m/r/g. No LE edema.  Marland Kitchen Respiratory:   CTA bilaterally with no wheezes/rales/rhonchi.  Normal respiratory effort. . Abdomen:  soft, NT, ND, hypoactive BS . Skin:  no rash or induration seen on limited exam . Musculoskeletal:   no bony abnormality . Psychiatric:  somnolent mood and affect, speech sparse, AOx1 . Neurologic:  Unable to perform    Radiological Exams on Admission: Independently reviewed - see discussion in A/P where applicable  CT ABDOMEN PELVIS WO CONTRAST  Result Date: 05/16/2020 CLINICAL DATA:  Acute abdominal pain, nonlocalized, elevated LFTs EXAM: CT ABDOMEN AND PELVIS WITHOUT CONTRAST TECHNIQUE: Multidetector CT imaging of the abdomen and pelvis was performed following the standard protocol without IV contrast. COMPARISON:  04/15/2020 FINDINGS: Lower chest: Minimal basilar atelectasis versus scarring. Heart is enlarged. Pacer wires in the right side of the heart. Remote median sternotomy. No pericardial or pleural effusion. Degenerative changes of the lower thoracic spine. Hepatobiliary: Left hepatic hypodense cyst measuring 1.8  cm is unchanged. No intrahepatic biliary dilatation or new focal hepatic abnormality within the limits of noncontrast imaging. No biliary dilatation. Gallbladder unremarkable. Common bile duct nondilated. Pancreas: Unremarkable. No pancreatic ductal dilatation or surrounding inflammatory changes. Spleen: Normal in size without focal abnormality. Adrenals/Urinary Tract: Normal adrenal glands. No acute renal obstruction or hydronephrosis. No focal renal contour abnormality. Ureters are symmetric and decompressed. No ureteral obstruction or ureteral calculus. Bladder unremarkable. Stomach/Bowel: Negative for bowel obstruction, significant dilatation, ileus, or free air. Appendix not visualized. No acute inflammatory process in the right lower quadrant. No free fluid, fluid collection, hemorrhage, hematoma, abscess or ascites. Vascular/Lymphatic: Extensive atherosclerosis of the aorta. Negative for aneurysm. No retroperitoneal hemorrhage or hematoma. No evidence of rupture. Evaluation the vasculature is limited without IV contrast. No bulky adenopathy. Reproductive: Uterus and adnexa within normal limits for age. Other: Intact abdominal wall.  Negative for hernia. Musculoskeletal: Artifact from the bilateral hip replacements. Degenerative changes of the spine. IMPRESSION: No acute intra-abdominal or pelvic finding by noncontrast CT. Cardiomegaly Stable left hepatic 1.8 cm cyst Aortic Atherosclerosis (ICD10-I70.0). Electronically Signed   By: Jerilynn Mages.  Shick M.D.   On: 05/17/2020 13:57   CT HEAD CODE STROKE WO CONTRAST  Result Date: 05/08/2020 CLINICAL DATA:  Code stroke. 80 year old female with altered mental status, facial droop. Last known well 1730 hours yesterday. EXAM: CT HEAD WITHOUT CONTRAST TECHNIQUE: Contiguous axial images were obtained from the base of the skull through the vertex without intravenous contrast. COMPARISON:  Head CT 04/15/2020.  Brain MRI 04/16/2020. FINDINGS: Brain: Small chronic area of right  parietal lobe encephalomalacia is stable. Stable much smaller area of cortical encephalomalacia at the inferior left occipital pole. Patchy bilateral white  matter hypodensity is stable. No midline shift, ventriculomegaly, mass effect, evidence of mass lesion, intracranial hemorrhage or evidence of cortically based acute infarction. Partially empty sella again noted. Stable dystrophic calcifications in the basal ganglia. Vascular: Calcified atherosclerosis at the skull base. No suspicious intracranial vascular hyperdensity. Skull: No acute osseous abnormality identified. Sinuses/Orbits: Visualized paranasal sinuses and mastoids are stable and well pneumatized. Other: Visualized orbits and scalp soft tissues are within normal limits. ASPECTS [facility] Stroke Program Early CT Score) Total score (0-10 with 10 being normal): 10 (chronic right parietal and left occipital cortical encephalomalacia). IMPRESSION: 1. Stable brain. No acute cortically based infarct or acute intracranial hemorrhage identified. ASPECTS 10. 2. Chronic right parietal and left occipital cortical encephalomalacia. 3. These results were communicated to Dr. Cheral Marker at 9:13 am on 05/26/2020 by text page via the [facility] messaging system. Electronically Signed   By: Genevie Ann M.D.   On: 05/17/2020 09:13    EKG: Independently reviewed.  NSR with rate 87; RBBB, LAFB;  nonspecific ST changes with no evidence of acute ischemia, NSCSLT   Labs on Admission: I have personally reviewed the available labs and imaging studies at the time of the admission.  Pertinent labs:   ABG: 7.375/28.2/98/16.5 K+ 5.4 CO2 14; 21 on 1/20 Glucose 144 BUN 62/Creatinine 2.48/GFR 18; 26/1.23/48 on 1/20 Anion gap 17 NH4 64 Bili 3.1; 0.7 and 0.6 last month AST/ALT pending due to need for dilution at [facility]; 278/734 on 1/20 and 29/104 on 1/27 Lipids 155/34/102/97 WBC 12.7 Hgb 10.8; 13.0 on 1/20 Platelets 87; 263 on 1/20 INR 4.0 COVID/flu negative Pathologist smear  review pending   Assessment/Plan Principal Problem:   Acute liver failure Active Problems:   Mixed hyperlipidemia   Essential hypertension   DM (diabetes mellitus) ([place])   Ischemic cardiomyopathy   Bilateral carotid artery stenosis   Chronic systolic heart failure ([place])   ICD Single chamber Medtronic VISIA MRI VR [id number] 04/21/2019   AKI (acute kidney injury) ([place])   Acute hepatic failure -Patient is critically ill with acute liver failure, possibly also hepatorenal syndrome -INR 4, LFTs too high to calculate and sent to [facility] for dilution -Renal failure may be associated with minimal recent PO intake and so may improve with IVF - but if not this is further indication of a critical disease process -Based on discussion with family, infection and autoimmune seem less likely culprits -She is a known vasculopath and history would suggest prior transient blockage and now more obstructive vascular process -Will order [place] with doppler to evaluate liver vasculature -GI to consult and will order labs -Needs vitamin K -Unable to take PO Lactulose so will order retention enema -She appears to need ICU admission, may need intubation for airway protection; I began the goals of care discussion with the family but they were quite overwhelmed - particularly in light of the fact that the patient was walking independently and even still working up to a month ago -Dr. Gilford Raid called the [facility] transplant center and she is not a candidate -Unfortunately, she appears to have a grave prognosis currently unless there is an obvious intervention available  Acute renal failure on stage 3b CKD -As noted above, this may be hepatorenal syndrome but it is also possible that this is associated with her lack of PO intake for the weekend -It is not clear whether she has actually made urine in several days -Will need rehydration - will defer to PCCM  Vasculopathy -Patient is s/p CABG and PCI and has known  B  asymptomatic high-grade carotid stenosis -Based on this history, this is concerning for vascular event leading to acute liver failure -Her renal failure precludes contrast dye -Will order stat liver US with dopplers  Chronic systolic CHF -09/12/60 echo with severe global hypokinesis and EF 20% - worse than prior of 25-30% in 09/2019 -Has AICD   *Based on above, this patient is critically ill with an overall poor prognosis.  If the patient has an intervenable problem that will lead to improvement in her liver failure, [facility] will be happy to resume care after discharge from the ICU.  Alternatively, if the family decides to transition to comfort measures, we will also be happy to assume care.  At this time, will sign off in the anticipation that the patient will be admitted to the ICU.    Note: This patient has been tested and is negative for the novel coronavirus COVID-19. The patient has been fully vaccinated against COVID-19.    Total critical care time: 55 minutes Critical care time was exclusive of separately billable procedures and treating other patients. Critical care was necessary to treat or prevent imminent or life-threatening deterioration. Critical care was time spent personally by me on the following activities: development of treatment plan with patient and/or surrogate as well as nursing, discussions with consultants, evaluation of patient's response to treatment, examination of patient, obtaining history from patient or surrogate, ordering and performing treatments and interventions, ordering and review of laboratory studies, ordering and review of radiographic studies, pulse oximetry and re-evaluation of patient's condition.    Karmen Bongo MD [facility]   How to contact the [facility] Attending or Consulting provider [place] or covering provider during after hours [place], for this patient?  1. Check the care team in [facility] and look for a) attending/consulting [facility] provider  listed and b) the [facility] team listed 2. Log into [email or web address] and use [place]'s universal password to access. If you do not have the password, please contact the hospital operator. 3. Locate the [facility] provider you are looking for under [facility] and page to a number that you can be directly reached. 4. If you still have difficulty reaching the provider, please page the [facility] (Director on Call) for the Hospitalists listed on amion for assistance.   05/12/2020, 3:58 PM

## 2020-05-19 NOTE — ED Triage Notes (Signed)
Pt BIB [facility] as Code Stroke. Family LKN 1730 yesterday. She has progressively became weaker since that time. EMS reports Lt sided facial droop & Right sided weakness upon their arrival. CBG 194 all VSS, verbal-lethargic.

## 2020-05-19 NOTE — Progress Notes (Addendum)
eLink Physician-Brief Progress Note Patient Name: Lynn Carey DOB: [birth year] MRN: [id number]   Date of Service  05/15/2020  HPI/Events of Note  New admit evaluation.   Dr Belva Crome, GI notes , labs, meds reviewed.  Camera: Resting . On room air. VS: 129/90, 96% stas, HR 87. Getting vit K IV. Has only 22 G PIV.  Data: Reviewed. BG 116 UA:   A/P: 70 yr F with CC: waxing waning mentation.elevated LFT mostly from tylenol use, CTH no acute changes. Now in ICU for close neuro/respiratory watch for any intubation. GI on the case.  Pasthistory : recent stroke. Bilateral carotid artery stenosis. Hx of status post hip replacement, coronary artery disease status post CABG in 1997 and ischemic and nonischemic cardiomyopathy with ejection fraction 20% [out of proportion to coronary artery disease, echo 2019] status post single-chamber Medtronic ICD implantation in January 2021   Camera: - discussed with bed side RN. Alert and awake. Talking , better than what she was in ED. VS stable. Protecting airways. HR 87, SBP 120 to 140's. sats 96% on room air.   Data; revieweed  1. Encephalopathy-hepatic. Mentation Improving nicely. Hx of recent stroke.  As per daughter- no tylenol use ( from notes review).  Hepatic US: no veno clusive disease.  - follow labs in AM - GI follow up in AM.  2. Anemia stable.  3. K 5.3, Cr 2.48 is stable.  4. UA nitrite +. - watch for now. No abx.   5. HTN/DM     eICU Interventions  - notified ground CCM team, Dr Elmyra Ricks. As per her, CCM already staffed earlier. - as per GI advice: NAC IV. But notes says that no tylenol use as per daughter and as per bed side RN: patient denied any recent tylenol use. So will defer NAC Rx.  - aspiration and sz precautions - Protonix and SCD as prophylaxis. BG goals < 180 - can get all GI lab work up in AM when get a good PIV. No central line needed just for these. - ordered NG tube as per RN request for meds.          Intervention Category Major Interventions: Other:;Change in mental status - evaluation and management Evaluation Type: New Patient Evaluation  [name] 05/29/2020, 8:47 PM
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
## 2020-05-19 NOTE — Consult Note (Signed)
Neurology Consultation  Reason for Consult: Left facial droop, right-sided weakness, decreased responsiveness   Referring Physician: Dr. Gilford Raid  CC: Decreased responsiveness, encephalopathic    History is obtained from: Chart review, EMS  HPI: Lynn Carey is a 80 y.o. female with a medical history significant for asthma, congestive heart failure s/p ICD placement, coronary artery disease, diabetes mellitus type 2, hypertension, and history of carotid artery occlusion on home clopidogrel who presents to [facility] 2/13 via EMS as a stroke alert due to decreased responsiveness and right-sided weakness. Patient was last seen well last night at 17:30 when she was able to get up and use the restroom, bathe, and eat with family assistance. Since 2/12 at 17:30, she had become progressively less responsive per family and at 07:30 this morning she was hardly responsive to family, prompting EMS activation. On EMS arrival, they noted left-sided facial droop, decreased responsiveness, and no movement of the right upper extremity.   On arrival to the ED, Lynn Carey appeared encephalopathic and EMS noted improvement in her left-sided facial droop. Her mental status appeared to be fluctuating as she sometimes followed commands on the right and had varying degrees of weakness in bilateral upper extremities. She would also open eyes to loud voice and attempted some verbalization stating that she was in the hospital.   LKW: 2/12 17:30 tpa given?: no, outside of time window.   ROS: Unable to obtain due to altered mental status.   Past Medical History:  Diagnosis Date  . Anginal pain ([place])    h/o, denies today   . Arthritis    hip - both, shot in L shoulder- 2 weeks ago  . Asthma   . Carotid artery occlusion   . CHF (congestive heart failure) ([place])   . Chronic systolic heart failure ([place]) 09/19/2018  . Complication of anesthesia   . Coronary artery disease   . Diabetes mellitus without complication ([place])    . Encounter for assessment of implantable cardioverter-defibrillator (ICD) 08/30/2019  . GERD (gastroesophageal reflux disease)   . Hypertension    followed by Dr. Einar Gip  . ICD Single chamber Medtronic VISIA MRI VR [id number] 04/21/2019 04/21/2019  . MYOCARDIAL INFARCTION 05/25/2007   Qualifier: History of  By: Milana Obey, Doroteo Bradford    . Myocardial infarction ([place]) 1997  . PONV (postoperative nausea and vomiting)   . Shortness of breath    Family History  Problem Relation Age of Onset  . Allergies Mother   . Asthma Mother   . Heart disease Mother   . Heart attack Mother   . Allergies Son   . Asthma Son   . Heart disease Father   . Heart attack Father   . Breast cancer Daughter 51   Past Surgical History:  Procedure Laterality Date  . CARDIAC CATHETERIZATION  05/2012   see note  . CORONARY ARTERY BYPASS GRAFT  [address]  . CORONARY STENT INTERVENTION Right 06/18/2017   Procedure: CORONARY STENT INTERVENTION;  Surgeon: Adrian Prows, MD;  Location: [facility];  Service: Cardiovascular;  Laterality: Right;  . EYE SURGERY     both eyes, laser procedure  . ICD IMPLANT N/A 04/21/2019   Procedure: ICD IMPLANT;  Surgeon: Deboraha Sprang, MD;  Location: [facility];  Service: Cardiovascular;  Laterality: N/A;  . JOINT REPLACEMENT Left 12/13/12   hip  . LEFT HEART CATH AND CORONARY ANGIOGRAPHY N/A 11/25/2017   Procedure: LEFT HEART CATH AND CORONARY ANGIOGRAPHY;  Surgeon:  Patwardhan, Reynold Bowen, MD;  Location: [facility];  Service: Cardiovascular;  Laterality: N/A;  . LEFT HEART CATH AND CORS/GRAFTS ANGIOGRAPHY N/A 06/18/2017   Procedure: LEFT HEART CATH AND CORS/GRAFTS ANGIOGRAPHY;  Surgeon: Adrian Prows, MD;  Location: [place];  Service: Cardiovascular;  Laterality: N/A;  . LEFT HEART CATHETERIZATION WITH CORONARY/GRAFT ANGIOGRAM N/A 04/21/2011   Procedure: LEFT HEART CATHETERIZATION WITH Beatrix Fetters;  Surgeon: Laverda Page, MD;  Location: [facility]  CATH LAB;  Service: Cardiovascular;  Laterality: N/A;  . PERCUTANEOUS CORONARY INTERVENTION-BALLOON ONLY Right 05/12/2011   Procedure: PERCUTANEOUS CORONARY INTERVENTION-BALLOON ONLY;  Surgeon: Laverda Page, MD;  Location: [facility] CATH LAB;  Service: Cardiovascular;  Laterality: Right;  . TOTAL HIP ARTHROPLASTY Left 12/13/2012   Procedure: TOTAL HIP ARTHROPLASTY ANTERIOR APPROACH;  Surgeon: Hessie Dibble, MD;  Location: [facility];  Service: Orthopedics;  Laterality: Left;  . TOTAL HIP ARTHROPLASTY Right 01/30/2014   Procedure: TOTAL HIP ARTHROPLASTY ANTERIOR APPROACH;  Surgeon: Hessie Dibble, MD;  Location: [facility];  Service: Orthopedics;  Laterality: Right;  . TUBAL LIGATION     Social History:   reports that she quit smoking about 36 years ago. Her smoking use included cigarettes. She has a 1.00 pack-year smoking history. She has never used smokeless tobacco. She reports that she does not drink alcohol and does not use drugs.  Medications Current Outpatient Medications  Medication Instructions  . Accu-Chek FastClix Lancets MISC Use as directed to check blood sugars 2 times per day dx: e11.9  . acetaminophen (TYLENOL) 650 mg, Oral, Every 8 hours PRN  . albuterol (VENTOLIN HFA) 108 (90 Base) MCG/ACT inhaler 2 puffs, Inhalation, Every 4 hours PRN  . Blood Glucose Monitoring Suppl (ACCU-CHEK AVIVA PLUS) w/Device KIT Use as directed to check blood sugars 2 times per day dx: e11.9  . clopidogrel (PLAVIX) 75 MG tablet TAKE 1 TABLET EVERY DAY  . dorzolamide-timolol (COSOPT) 22.3-6.8 MG/ML ophthalmic solution 1 drop, Both Eyes, 2 times daily  . ezetimibe (ZETIA) 10 MG tablet TAKE 1 TABLET EVERY DAY  . famotidine (PEPCID) 20 mg, Oral, At bedtime PRN  . gabapentin (NEURONTIN) 300 MG capsule TAKE 1 CAPSULE(300 MG) BY MOUTH FOUR TIMES DAILY  . glucose blood (ACCU-CHEK AVIVA PLUS) test strip Use as directed to check blood sugars 2 times per day dx: e11.9  . linaclotide (LINZESS) 72 mcg, Oral, Daily before  breakfast  . metoprolol succinate (TOPROL-XL) 100 mg, Oral, Daily, Take with or immediately following a meal.  . mirtazapine (REMERON) 15 mg, Oral, Daily at bedtime  . montelukast (SINGULAIR) 10 mg, Oral, Daily at bedtime  . nitroGLYCERIN (NITROSTAT) 0.4 mg, Sublingual, Every 5 min PRN  . pantoprazole (PROTONIX) 40 MG tablet TAKE 1 TABLET EVERY DAY  . ranolazine (RANEXA) 1,000 mg, Oral, 2 times daily  . rosuvastatin (CRESTOR) 40 MG tablet TAKE 1/2 TABLET EVERY DAY  . sacubitril-valsartan (ENTRESTO) 97-103 MG 1 tablet, Oral, 2 times daily  . sitaGLIPtin (JANUVIA) 50 mg, Oral, Daily  . spironolactone (ALDACTONE) 50 MG tablet TAKE 1 TABLET BY MOUTH EVERY DAY  . temazepam (RESTORIL) 15 mg, Oral, At bedtime PRN  . traMADol (ULTRAM) 50 mg, Oral, Every 12 hours PRN  . Vitamin D 2,000 Units, Oral, Daily   Exam: Current vital signs: BP 138/89 (BP Location: Left Arm)   Pulse 87   Temp (!) 97.4 F (36.3 C) (Oral)   Resp 17   Wt 64.3 kg   SpO2 91%   BMI 26.78 kg/m  Vital signs in last 24 hours: Temp:  [97.4 F (36.3 C)] 97.4 F (36.3 C) (02/13 0915) Pulse Rate:  [87] 87 (02/13 0915) Resp:  [17] 17 (02/13 0915) BP: (138-139)/(89-101) 138/89 (02/13 0915) SpO2:  [91 %-98 %] 91 % (02/13 0915) Weight:  [64.3 kg] 64.3 kg (02/13 0918)  GENERAL: Somnolent, laying in stretcher, no acute distress HEAD: - Normocephalic and atraumatic EENT: Arcus senilis present bilaterally, edentulous, dry mucous membranes, no OP obstruction LUNGS - Normal respiratory effort. Non-labored respirations CV - extremities cool, without edema, regular rate on cardiac monitor ABDOMEN - Soft, non-distended MSK: No joint deformities noted  NEURO:  Mental Status: Patient is somnolent, arouses to loud voice, falls asleep 2-3 seconds after arousal and requires constant stimulation for cooperation with exam. She intermittently follows commands. Speech is intermittently garbled and dysarthric but she is also edentulous. Does  not speak in full sentences and provides one word responses. Often perseverates. Initially able to state that she is in the hospital but unable to correctly identify month, city, or age. Identifies the state correctly once she is oriented to the city she is located in. Naming is intact. Does not attempt to repeat phrases. Comprehension and fluency are impaired. Patient is mildly aphasic and intermittently struggles with word finding. Poor attention noted.  Cranial Nerves:  II: PERRL 2 mm/brisk. Blink to threat absent in all visual fields. III, IV, VI: EOMI. Able to fixate and track examiner around stretcher inconsistently. V:  Unable to assess facial sensation due to patient mental status. VII: Face is symmetric resting and smiling.   VIII: Hearing intact to loud voice IX, X: Phonation intact.  XI: Does not shrug shoulders to command XII: Tongue protrudes midline without fasciculations.   Motor: varying degrees of strength noted throughout examination on all extremities. Upper extremities 3/5 - 4/5 strength with intermittent anti-gravity movement; sometimes with immediate drift to bed bilaterally, sometimes with drift to bed after 5 seconds associated with poor attention. Bilateral lower extremities with intermittent antigravity movement without drift to bed with significant coaching and stimulation for participation. Bilateral lower extremities 3/5 - 4/5. Grimace and vocalization with noxious stimuli but with minimal withdrawal of upper and lower extremities. Tone is decreased. Bulk is normal.  Sensation: Unable to fully assess due to patient mental status, minimal withdrawal to noxious stimuli in bilateral upper and lower extremities. Wincing and vocalization noted with bilateral noxious stimuli.  Coordination: Unable to assess due to mental status  DTRs: 0 right patellar, +1 left patellar, 2+ bilateral biceps and brachioradialis  Gait- deferred  1a Level of Conscious.: 2; stuporous requiring  constant stimulation for participation 1b LOC Questions: 1; states age correctly but does not correctly state month 1c LOC Commands: 1; intermittently follows simple commands 2 Best Gaze: 0 3 Visual: 0; blink to threat absent in visual fields though patient can identify objects for examiner 4 Facial Palsy: 0 5a Motor Arm - left: 2 5b Motor Arm - Right: 2 6a Motor Leg - Left: 0 6b Motor Leg - Right: 0 7 Limb Ataxia: 0 8 Sensory: 0 9 Best Language: 1 10 Dysarthria: 1 11 Extinct. and Inatten.: 0 TOTAL: 10  Labs I have reviewed labs in epic and the results pertinent to this consultation are: CBC    Component Value Date/Time   WBC 9.7 04/25/2020 1110   WBC 5.1 04/17/2020 0250   RBC 4.39 04/25/2020 1110   RBC 3.63 (L) 04/17/2020 0250   HGB 12.2 05/20/2020 0856   HGB 13.0  04/25/2020 1110   HCT 36.0 05/08/2020 0856   HCT 38.6 04/25/2020 1110   PLT 263 04/25/2020 1110   MCV 88 04/25/2020 1110   MCH 29.6 04/25/2020 1110   MCH 29.8 04/17/2020 0250   MCHC 33.7 04/25/2020 1110   MCHC 33.3 04/17/2020 0250   RDW 13.7 04/25/2020 1110   LYMPHSABS 1.5 04/25/2020 1110   MONOABS 0.8 04/16/2020 0442   EOSABS 0.2 04/25/2020 1110   BASOSABS 0.0 04/25/2020 1110   CMP     Component Value Date/Time   NA 140 05/14/2020 0856   NA 140 04/25/2020 1110   K 5.8 (H) 06/01/2020 0856   CL 112 (H) 05/10/2020 0856   CO2 21 04/25/2020 1110   GLUCOSE 141 (H) 05/18/2020 0856   BUN 65 (H) 05/11/2020 0856   BUN 26 04/25/2020 1110   CREATININE 2.40 (H) 05/29/2020 0856   CALCIUM 10.0 04/25/2020 1110   PROT 6.6 05/02/2020 1109   ALBUMIN 3.8 05/02/2020 1109   AST 29 05/02/2020 1109   ALT 104 (H) 05/02/2020 1109   ALKPHOS 68 05/02/2020 1109   BILITOT 0.6 05/02/2020 1109   GFRNONAA 42 (L) 04/25/2020 1110   GFRNONAA >60 04/17/2020 0250   GFRAA 48 (L) 04/25/2020 1110   Lipid Panel     Component Value Date/Time   CHOL 159 07/26/2019 1139   TRIG 134 07/26/2019 1139   HDL 55 07/26/2019 1139    CHOLHDL 2.9 07/26/2019 1139   LDLCALC 81 07/26/2019 1139   Lab Results  Component Value Date   HGBA1C 5.6 04/16/2020   Imaging I have reviewed the images obtained:  CT-scan of the brain: 1. Stable brain. No acute cortically based infarct or acute intracranial hemorrhage identified. ASPECTS 10. 2. Chronic right parietal and left occipital cortical encephalomalacia.  MRI examination of the brain pending; patient with ICD in place  Echocardiogram 05/08/2020: Left ventricle cavity is mildly dilated. Normal left ventricular wall thickness. Severe global hypokinesis, LVEF 20%. Spontaneous echo contrast  seen. Cannot rule out thrombus on non-contrast study.  Indeterminate  diastolic filling pattern due to E/A fusion. Calculated EF 20%.  Left atrial cavity is severely dilated.  Moderate (Grade II) mitral regurgitation.  Moderate tricuspid regurgitation. Moderate pulmonary hypertension.  Estimated pulmonary artery systolic pressure 54 mmHg. RVSP measures 54  mmHg.  Mild pulmonic regurgitation.  Compared to previous study on 10/03/2019, LVEF has further reduced from  25-30%. No other significant change noted.   Assessment: 80 year old female with history as above who presents with encephalopathy, fluctuating mental status, and varying degrees of weakness on neurologic examination. She is on clopidogrel at home with reported compliance with medications. - Examination significant for bilateral upper extremity weakness, sometimes right weaker than left, and inconsistent command following. Patient consistently somnolent and disoriented. - CT head obtained revealing no acute cortically cased infarct or acute ICH and with chronic right parietal and left occipital cortical encephalomalacia. CT angio unable to be obtained due to elevation in BUN and creatinine on initial blood work.  - MRI pending, delayed due to patient with ICD in place.  - Patient with encephalopathy. DDx includes toxic metabolic etiology  (Creatinine 0.88-1.23 at baseline, elevated to 2.4 on hospital arrival in addition to elevated potassium at 5.8); TIA with improvement of symptoms on arrival to hospital; acute ischemia/infarct; or uremia.  - Not an IV tPA candidate due to being out of the time window.  - Exam findings not consistent with LVO. Not a thrombectomy candidate. Risks of CTA regarding renal function  are felt to outweigh potential benefits regarding diagnostic information; CTA is felt likely to be of low-yield diagnostically.   Recommendations: - Fasting lipid panel - MRI, MRA  of the brain without contrast - MRA neck without contrast - Frequent neuro checks - Prophylactic therapy- Antiplatelet med: Continue home clopidogrel. Consider adding ASA 81 mg for 3 weeks if MRI brain with new infarct.  - Risk factor modification - Telemetry monitoring - PT consult, OT consult, Speech consult - Metabolic derangement management per primary team - Stroke team to follow  Anibal Henderson, AGAC-NP [facility] Pager: [phone]  I have seen and examined the patient. I have formulated the assessment and recommendations. 80 year old female presenting with encephalopathy, fluctuating mental status, and varying degrees of weakness on neurologic examination. She is on clopidogrel at home with reported compliance with medications. CT head obtained revealing no acute cortically cased infarct or acute ICH and with chronic right parietal and left occipital cortical encephalomalacia. CT angio unable to be obtained due to elevation in BUN and creatinine on initial blood work. DDx for presentation includes toxic metabolic etiology; TIA with improvement of symptoms on arrival to hospital; acute ischemia/infarct; or uremia. Recommendations as above.  Electronically signed: Dr. Kerney Elbe

## 2020-05-19 NOTE — H&P (Signed)
NAME:  Lynn Carey, MRN:  [id number], DOB:  1941-01-14, LOS: 0 ADMISSION DATE:  05/12/2020, CONSULTATION DATE:  06/02/2020  REFERRING MD:  EDP and triad Dr Feliberto Gottron, CHIEF COMPLAINT:  Acute Liver failure with encephalopathy   Brief History:  Fulminant liver failure, acute encephalopathy,  History of Present Illness: History is from chart review and talking to the daughter at the bedside and talking to GI specialist Dr. 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  She also has bilateral asymptomatic high-grade carotid stenosis 70%, hyperlipidemia stage III chronic kidney disease asthmatic bronchitis not otherwise specified and hypertension.  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  In July 2021 third of had stroke in the right eye by ophthalmologist.  CT angiogram of the neck at that time revealed moderate disease in bilateral internal carotid arteries.  She was admitted for 2 days between April 15, 2020 and April 17, 2020 with slurred speech and balance issues.  Her work-up was unremarkable especially MRI brain and MRI lumbar spine.  There is hypertension at the time of admission.  Multiple medications were discontinued.  According to the daughter after reaching home because of chronic arthralgia she is taking Tylenol scheduled likely 650 mg 4 times a day along with tramadol.  Her liver function tests are normal but by May 03 2020-week after discharge she had transaminitis documented below.  A week later the LFTs seem to improve.  Bilirubin was normal all along.  On  May 07, 2020 she did have a fall.  On May 15, 2020 she followed up with Dr. Einar Gip cardiology who lowered her Lopressor.  Then May 19, 2020 she was brought in with change in mental status and confusion waxing and waning status.  At this time history of abdominal pain for few weeks was also noticed.  Her liver function test was grossly abnormal again.  AST ALT were too high to calculate bilirubin of 3.1 and INR of 4.  CT without contrast showed no acute pathology.  Family categorically denied any Tylenol intake in the last 3 weeks.  In fact he said many of the medications have been reduced.  Initially admitted to the hospitalist service but then given coagulopathy and anticipated worsening critical care medicine asked to admit.  GI does not believe patient be a good liver transplant candidate  Past Medical History:    has a past medical history of Arthritis, Asthma, Carotid artery occlusion, Chronic systolic heart failure ([place]) (9/93/5701), Complication of anesthesia, Coronary artery disease, Diabetes mellitus without complication ([place]), GERD (gastroesophageal reflux disease), Hypertension, ICD Single chamber Medtronic VISIA MRI VR [id number] 04/21/2019 (04/21/2019), and PONV (postoperative nausea and vomiting).   reports that she quit smoking about 36 years ago. Her smoking use included cigarettes. She has a 1.00 pack-year smoking history. She has never used smokeless tobacco.  Past Surgical History:  Procedure Laterality Date  . CARDIAC CATHETERIZATION  05/2012   see note  . CORONARY ARTERY BYPASS GRAFT  [address]  . CORONARY STENT INTERVENTION Right 06/18/2017   Procedure: CORONARY STENT INTERVENTION;  Surgeon: Adrian Prows, MD;  Location: [facility];  Service: Cardiovascular;  Laterality: Right;  . EYE SURGERY     both eyes, laser procedure  . ICD IMPLANT N/A 04/21/2019   Procedure: ICD IMPLANT;  Surgeon: Deboraha Sprang, MD;  Location: [facility];  Service:  Cardiovascular;  Laterality: N/A;  . JOINT REPLACEMENT Left 12/13/12   hip  . LEFT HEART CATH AND CORONARY ANGIOGRAPHY N/A 11/25/2017   Procedure: LEFT HEART CATH AND CORONARY ANGIOGRAPHY;  Surgeon: [name], MD;  Location: [facility];  Service: Cardiovascular;  Laterality: N/A;  . LEFT HEART CATH AND CORS/GRAFTS ANGIOGRAPHY N/A 06/18/2017   Procedure: LEFT HEART CATH AND CORS/GRAFTS ANGIOGRAPHY;  Surgeon: Adrian Prows, MD;  Location: [facility];  Service: Cardiovascular;  Laterality: N/A;  . LEFT HEART CATHETERIZATION WITH CORONARY/GRAFT ANGIOGRAM N/A 04/21/2011   Procedure: LEFT HEART CATHETERIZATION WITH Beatrix Fetters;  Surgeon: Laverda Page, MD;  Location: [facility] C[facility] LAB;  Service: Cardiovascular;  Laterality: N/A;  . PERCUTANEOUS CORONARY INTERVENTION-BALLOON ONLY Right 05/12/2011   Procedure: PERCUTANEOUS CORONARY INTERVENTION-BALLOON ONLY;  Surgeon: Laverda Page, MD;  Location: [facility] CATH LAB;  Service: Cardiovascular;  Laterality: Right;  . TOTAL HIP ARTHROPLASTY Left 12/13/2012   Procedure: TOTAL HIP ARTHROPLASTY ANTERIOR APPROACH;  Surgeon: Hessie Dibble, MD;  Location: [facility];  Service: Orthopedics;  Laterality: Left;  . TOTAL HIP ARTHROPLASTY Right 01/30/2014   Procedure: TOTAL HIP ARTHROPLASTY ANTERIOR APPROACH;  Surgeon: Hessie Dibble, MD;  Location: [facility];  Service: Orthopedics;  Laterality: Right;  . TUBAL LIGATION      Allergies  Allergen Reactions  . Lisinopril Cough    Immunization History  Administered Date(s) Administered  . Fluad Quad(high Dose 65+) 11/30/2018  . Influenza, High Dose Seasonal PF 11/30/2018  . Influenza,inj,Quad PF,6+ Mos 12/16/2012, 02/01/2014  . Influenza-Unspecified 12/13/2017  . PFIZER(Purple Top)SARS-COV-2 Vaccination 05/18/2019, 06/10/2019, 01/16/2020  . Pneumococcal Conjugate-13 04/03/2019, 04/03/2019  . Pneumococcal Polysaccharide-23 12/16/2012    Family History  Problem Relation Age of Onset  .  Allergies Mother   . Asthma Mother   . Heart disease Mother   . Heart attack Mother   . Allergies Son   . Asthma Son   . Heart disease Father   . Heart attack Father   . Breast cancer Daughter 57     Current Facility-Administered Medications:  .  lactulose (CHRONULAC) enema 200 gm, 300 mL, Rectal, Once, Karmen Bongo, MD .  sodium chloride flush (NS) 0.9 % injection 3 mL, 3 mL, Intravenous, Once, Isla Pence, MD  Current Outpatient Medications:  .  albuterol (VENTOLIN HFA) 108 (90 Base) MCG/ACT inhaler, Inhale 2 puffs into the lungs every 4 (four) hours as needed for wheezing or shortness of breath., Disp: 6.7 g, Rfl: 3 .  Cholecalciferol (VITAMIN D) 2000 UNITS CAPS, Take 2,000 Units by mouth daily. , Disp: , Rfl:  .  clopidogrel (PLAVIX) 75 MG tablet, TAKE 1 TABLET EVERY DAY (Patient taking differently: Take 75 mg by mouth.), Disp: 90 tablet, Rfl: 0 .  dorzolamide-timolol (COSOPT) 22.3-6.8 MG/ML ophthalmic solution, Place 1 drop into both eyes 2 (two) times daily., Disp: , Rfl:  .  ezetimibe (ZETIA) 10 MG tablet, TAKE 1 TABLET EVERY DAY (Patient taking differently: Take 10 mg by mouth daily.), Disp: 90 tablet, Rfl: 2 .  famotidine (PEPCID) 20 MG tablet, Take 20 mg by mouth at bedtime as needed for heartburn or indigestion., Disp: , Rfl:  .  gabapentin (NEURONTIN) 300 MG capsule, TAKE 1 CAPSULE(300 MG) BY  MOUTH FOUR TIMES DAILY (Patient taking differently: Take 300 mg by mouth 4 (four) times daily.), Disp: 120 capsule, Rfl: 0 .  linaclotide (LINZESS) 72 MCG capsule, Take 1 capsule (72 mcg total) by mouth daily before breakfast., Disp: 30 capsule, Rfl: 0 .  mirtazapine (REMERON) 15 MG tablet, Take 1 tablet (15 mg total) by mouth at bedtime., Disp: 30 tablet, Rfl: 2 .  montelukast (SINGULAIR) 10 MG tablet, Take 1 tablet (10 mg total) by mouth at bedtime., Disp: 30 tablet, Rfl: 0 .  nitroGLYCERIN (NITROSTAT) 0.4 MG SL tablet, Place 1 tablet (0.4 mg total) under the tongue every 5 (five)  minutes as needed for chest pain., Disp: 30 tablet, Rfl: 0 .  pantoprazole (PROTONIX) 40 MG tablet, TAKE 1 TABLET EVERY DAY (Patient taking differently: Take 40 mg by mouth daily.), Disp: 90 tablet, Rfl: 1 .  ranolazine (RANEXA) 500 MG 12 hr tablet, Take 2 tablets (1,000 mg total) by mouth 2 (two) times daily., Disp: 60 tablet, Rfl: 6 .  rosuvastatin (CRESTOR) 40 MG tablet, TAKE 1/2 TABLET EVERY DAY (Patient taking differently: Take 20 mg by mouth daily.), Disp: 45 tablet, Rfl: 3 .  sacubitril-valsartan (ENTRESTO) 97-103 MG, Take 1 tablet by mouth 2 (two) times daily., Disp: 120 tablet, Rfl: 3 .  sitaGLIPtin (JANUVIA) 50 MG tablet, Take 1 tablet (50 mg total) by mouth daily., Disp: 90 tablet, Rfl: 1 .  spironolactone (ALDACTONE) 50 MG tablet, TAKE 1 TABLET BY MOUTH EVERY DAY (Patient taking differently: Take 50 mg by mouth daily.), Disp: 90 tablet, Rfl: 2 .  temazepam (RESTORIL) 15 MG capsule, Take 15 mg by mouth at bedtime as needed for sleep., Disp: , Rfl: 1 .  Accu-Chek FastClix Lancets MISC, Use as directed to check blood sugars 2 times per day dx: e11.9, Disp: 100 each, Rfl: 1 .  Blood Glucose Monitoring Suppl (ACCU-CHEK AVIVA PLUS) w/Device KIT, Use as directed to check blood sugars 2 times per day dx: e11.9, Disp: 1 kit, Rfl: 1 .  glucose blood (ACCU-CHEK AVIVA PLUS) test strip, Use as directed to check blood sugars 2 times per day dx: e11.9, Disp: 100 each, Rfl: 2 .  latanoprost (XALATAN) 0.005 % ophthalmic solution, Place 1 drop into both eyes at bedtime. Newly Prescribed, Disp: , Rfl:  .  metoprolol succinate (TOPROL-XL) 100 MG 24 hr tablet, Take 1 tablet (100 mg total) by mouth daily. Take with or immediately following a meal., Disp: 90 tablet, Rfl: 3 .  traMADol (ULTRAM) 50 MG tablet, Take 1 tablet (50 mg total) by mouth every 12 (twelve) hours as needed. (Patient not taking: No sig reported), Disp: 30 tablet, Rfl: 0   Significant Hospital Events:  05/15/2020 - admit   Consults:   05/12/2020 - GI  Procedures:  x  Significant Diagnostic Tests:  2/13 - Doppler Liver  Micro Data:  x  Antimicrobials:   Anti-infectives (From admission, onward)   None        Interim History / Subjective:   05/13/2020 - seen in ER bed 035  Objective   Blood pressure (!) 140/98, pulse 85, temperature (!) 97.4 F (36.3 C), temperature source Oral, resp. rate 19, weight 64.3 kg, SpO2 99 %.        Intake/Output Summary (Last 24 hours) at 05/18/2020 1657 Last data filed at 05/27/2020 1500 Gross per 24 hour  Intake --  Output 700 ml  Net -700 ml   Filed Weights   05/30/2020 0918  Weight: 64.3 kg    Examination:  General: eldely deconditioned female. Not on o2. Sitting in bed in ER HENT: No neck nodes, No elevated JVP Lungs: no distress. CTA bilaterally Cardiovascular: Normal heart sounds Abdomen: soft. No bleeding. No tendeess Extremities: intact Neuro: Plesanatly confused, wAxing and waning GU: not examined  Resolved Hospital Problem list   x  Assessment & Plan:  ASSESSMENT / PLAN:   A:  At high risk for respiratory failure and intubation due to liver failure and encephalopathy  05/31/2020 -> Currently well on room air and protecting airway  P:   o2 if needed for goal pulse oox > 92% Intubate if she gets worse    A:   Admission mid Jan 2022 for slurred speech. Normal MRI brain. Probably related to hypotension due to medications Acute encephalopathy due to Acute Liver Failure  P:   Check ammonia Lactulose     A:   70% bilateral Carotid stenosis FEb 2022 (dr Einar Gip) serial monitoring AT risk for vascular collapse  P:  MAP goal > 65 - abroutp BP reductions puts her at risk for stroke    A: Severe Cardiomyopathy - ef 20% in 2019  P: Check echo Sending note to Dr Einar Gip Serial trop   A: S/p ICD in place for primary prevention of SCD   P: tele   A:   No evidence of infection P:   Check PCT Monitor without abx   A:  CKD -  baseline creat 1.10-1.33mg % AKI - admit 05/28/2020 - creat 2.4mg % - basis cardiorenal v hepato renal v perfusio   P:  Maintain BP/HR Wil give 500cc fluid bolus  Acidosis - lactic - lilkey due to liver issues  2/13 -creat up, lactate 3s, bic down   Plan  - fluid bolus small volume an dmonitor - check ANCA and GBM antibodies   A:  Hyperkalemia with AKI P: lokelma    A:   Acute Liver Failure - few weeks after stopping tylenol. Primary problem for this admit 06/02/2020   P:   Per GI - NAC empitic Check ammonia Give lactulose ppi Vit k Etiology workup per GI - Smooth muscle AAb, Hep virus, tylenol level  A:  Anemia of critical illness  2/13 - no bleeding   P:  - PRBC for hgb </= 6.9gm%    - exceptions are   -  if ACS susepcted/confirmed then transfuse for hgb </= 8.0gm%,  or    -  active bleeding with hemodynamic instability, then transfuse regardless of hemoglobin value   At at all times try to transfuse 1 unit prbc as possible with exception of active hemorrhage  - check smear, dic panel and ADAMTS13   A New thromboctyopenia 05/09/2020 - 87k (normal in Jan 2022)  P No heparin/lovenox Monitor Check DIC panel Check [id number] Chek smear  ENDOCRINE A:   At risk low an dhigh sugar   P:   ssi    Best practice (evaluated daily)  Diet: npo except sips and meds Pain/Anxiety/Delirium protocol (if indicated): none VAP protocol (if indicated): non DVT prophylaxis: scd GI prophylaxis: ppo Glucose control: ssi Mobility: bed rest Disposition:move from ER to ICU  Goals of Care:   Multi-Disciplinary Goals of Care Discussion   Family Updates: 2/13 - done in ER with youngest daughter at bedside and oldest on phone: Full Code     [place]   The patient Lynn Carey is critically ill with multiple organ systems failure and requires high complexity decision making for assessment and support,  frequent evaluation and titration of therapies,  application of advanced monitoring technologies and extensive interpretation of multiple databases.   Critical Care Time devoted to patient care services described in this note is  90  Minutes. This time reflects time of care of this signee Dr Brand Males. This critical care time does not reflect procedure time, or teaching time or supervisory time of PA/NP/Med student/Med Resident etc but could involve care discussion time     Dr. Brand Males, M.D., [facility].C.P Pulmonary and Critical Care Medicine Staff Physician [facility] Pager: [phone], If no answer or between  15:00h - 7:00h: call [phone]  05/16/2020 4:57 PM    LABS     PULMONARY Recent Labs  Lab 05/23/2020 0856 05/08/2020 1127  PHART  --  7.375  PCO2ART  --  28.2*  PO2ART  --  98  HCO3  --  16.5*  TCO2 18* 17*  O2SAT  --  98.0    CBC Recent Labs  Lab 05/23/2020 0856 05/16/2020 1120 05/31/2020 1127  HGB 12.2 10.8* 11.6*  HCT 36.0 35.0* 34.0*  WBC  --  12.7*  --   PLT  --  87*  --     COAGULATION Recent Labs  Lab 05/15/2020 1120  INR 4.0*    CARDIAC  No results for input(s): TROPONINI in the last 168 hours. No results for input(s): PROBNP in the last 168 hours.   CHEMISTRY Recent Labs  Lab 05/29/2020 0856 05/17/2020 1120 05/15/2020 1127  NA 140 141 140  K 5.8* 5.4* 5.3*  CL 112* 110  --   CO2  --  14*  --   GLUCOSE 141* 144*  --   BUN 65* 62*  --   CREATININE 2.40* 2.48*  --   CALCIUM  --  9.8  --    Estimated Creatinine Clearance: 15.8 mL/min (A) (by C-G formula based on SCr of 2.48 mg/dL (H)).   LIVER Recent Labs  Lab 05/23/2020 1120  AST RESULTS UNAVAILABLE DUE TO INTERFERING SUBSTANCE  ALT RESULTS UNAVAILABLE DUE TO INTERFERING SUBSTANCE  ALKPHOS 120  BILITOT 3.1*  PROT 6.3*  ALBUMIN 3.4*  INR 4.0*     INFECTIOUS Recent Labs  Lab 05/21/2020 1120  LATICACIDVEN 3.2*     ENDOCRINE CBG (last 3)  Recent Labs    05/31/2020 0844   GLUCAP 127*         IMAGING x48h  - image(s) personally visualized  -   highlighted in bold CT ABDOMEN PELVIS WO CONTRAST  Result Date: 05/27/2020 CLINICAL DATA:  Acute abdominal pain, nonlocalized, elevated LFTs EXAM: CT ABDOMEN AND PELVIS WITHOUT CONTRAST TECHNIQUE: Multidetector CT imaging of the abdomen and pelvis was performed following the standard protocol without IV contrast. COMPARISON:  04/15/2020 FINDINGS: Lower chest: Minimal basilar atelectasis versus scarring. Heart is enlarged. Pacer wires in the right side of the heart. Remote median sternotomy. No pericardial or pleural effusion. Degenerative changes of the lower thoracic spine. Hepatobiliary: Left hepatic hypodense cyst measuring 1.8 cm is unchanged. No intrahepatic biliary dilatation or new focal hepatic abnormality within the limits of noncontrast imaging. No biliary dilatation. Gallbladder unremarkable. Common bile duct nondilated. Pancreas: Unremarkable. No pancreatic ductal dilatation or surrounding inflammatory changes. Spleen: Normal in size without focal abnormality. Adrenals/Urinary Tract: Normal adrenal glands. No acute renal obstruction or hydronephrosis. No focal renal contour abnormality. Ureters are symmetric and decompressed. No ureteral obstruction or ureteral calculus. Bladder unremarkable. Stomach/Bowel: Negative for  bowel obstruction, significant dilatation, ileus, or free air. Appendix not visualized. No acute inflammatory process in the right lower quadrant. No free fluid, fluid collection, hemorrhage, hematoma, abscess or ascites. Vascular/Lymphatic: Extensive atherosclerosis of the aorta. Negative for aneurysm. No retroperitoneal hemorrhage or hematoma. No evidence of rupture. Evaluation the vasculature is limited without IV contrast. No bulky adenopathy. Reproductive: Uterus and adnexa within normal limits for age. Other: Intact abdominal wall.  Negative for hernia. Musculoskeletal: Artifact from the bilateral  hip replacements. Degenerative changes of the spine. IMPRESSION: No acute intra-abdominal or pelvic finding by noncontrast CT. Cardiomegaly Stable left hepatic 1.8 cm cyst Aortic Atherosclerosis (ICD10-I70.0). Electronically Signed   By: Jerilynn Mages.  Shick M.D.   On: 06/01/2020 13:57   DG Chest Portable 1 View  Result Date: 05/07/2020 CLINICAL DATA:  Per nurse: Pt came in as Code Stroke. Family LKN 1730 yesterday. She has progressively become weaker since that time. EMS reports Lt sided facial droop AND Right sided weakness upon their arrival. EXAM: PORTABLE CHEST 1 VIEW COMPARISON:  04/15/2020 FINDINGS: Stable changes from prior cardiac surgery. Cardiac silhouette is mildly enlarged. No mediastinal or hilar masses. Left anterior chest wall AICD is stable. Clear lungs.  No pleural effusion or pneumothorax. Skeletal structures are grossly intact. IMPRESSION: No acute cardiopulmonary disease. Electronically Signed   By: Lajean Manes M.D.   On: 05/20/2020 16:03   CT HEAD CODE STROKE WO CONTRAST  Result Date: 05/16/2020 CLINICAL DATA:  Code stroke. 80 year old female with altered mental status, facial droop. Last known well 1730 hours yesterday. EXAM: CT HEAD WITHOUT CONTRAST TECHNIQUE: Contiguous axial images were obtained from the base of the skull through the vertex without intravenous contrast. COMPARISON:  Head CT 04/15/2020.  Brain MRI 04/16/2020. FINDINGS: Brain: Small chronic area of right parietal lobe encephalomalacia is stable. Stable much smaller area of cortical encephalomalacia at the inferior left occipital pole. Patchy bilateral white matter hypodensity is stable. No midline shift, ventriculomegaly, mass effect, evidence of mass lesion, intracranial hemorrhage or evidence of cortically based acute infarction. Partially empty sella again noted. Stable dystrophic calcifications in the basal ganglia. Vascular: Calcified atherosclerosis at the skull base. No suspicious intracranial vascular hyperdensity.  Skull: No acute osseous abnormality identified. Sinuses/Orbits: Visualized paranasal sinuses and mastoids are stable and well pneumatized. Other: Visualized orbits and scalp soft tissues are within normal limits. ASPECTS [facility] Stroke Program Early CT Score) Total score (0-10 with 10 being normal): 10 (chronic right parietal and left occipital cortical encephalomalacia). IMPRESSION: 1. Stable brain. No acute cortically based infarct or acute intracranial hemorrhage identified. ASPECTS 10. 2. Chronic right parietal and left occipital cortical encephalomalacia. 3. These results were communicated to Dr. Cheral Marker at 9:13 am on 05/08/2020 by text page via the [facility] messaging system. Electronically Signed   By: Genevie Ann M.D.   On: 05/22/2020 09:13

## 2020-05-19 NOTE — Progress Notes (Signed)
NG tube placed at this time. Auscultation performed, awaiting x-ray to verify placement at this time.

## 2020-05-19 NOTE — ED Notes (Addendum)
Urine sent to lab WITH a culture.

## 2020-05-19 NOTE — ED Notes (Signed)
No access, this RN plus IV team unable to obtain IV.

## 2020-05-19 NOTE — Progress Notes (Signed)
Lab unable to collect blood, patient difficult stick and patient does not bleed a lot per LAB. Will notify [facility] of inability to draw labs
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
## 2020-05-19 NOTE — ED Notes (Signed)
Pt to lethargic to perform swallow exam. She could not stay awake and follow commands, will try again later.

## 2020-05-19 NOTE — ED Notes (Signed)
IV team at bedside, no access yet.

## 2020-05-19 NOTE — Progress Notes (Signed)
2nd assessed for PIV with ultrasound,pt have a very small poor veins. No suitable veins for PIV/ midline.

## 2020-05-19 NOTE — ED Notes (Signed)
2nd IV team member at beside.

## 2020-05-19 NOTE — Progress Notes (Signed)
Patient arrived to unit at this time, A&O x4, responsive and able to follow commands. Calm and cooperative Some inattention noted. Cardiac NSR. Skin assessed with Probation officer and TEFL teacher. Skin intact, dry skin to bilateral feet. Patient noted to have productive cough, educated on how to use yaunker. No complaints of pain at this time. 98% saturation on room air. Safety measures in place, call bell within reach, patient educated on how to use call bell. E-Link contact to discuss access line placement as patient currently has a single 22g IV to right forearm and is a hard stick, with multiple labs that need to be drawn.   Plan for MRI of head w/o contrast.   Will continue to monitor at this time.

## 2020-05-20 ENCOUNTER — Inpatient Hospital Stay (HOSPITAL_COMMUNITY): Payer: Medicare HMO

## 2020-05-20 ENCOUNTER — Inpatient Hospital Stay: Payer: Self-pay

## 2020-05-20 DIAGNOSIS — Z95810 Presence of automatic (implantable) cardiac defibrillator: Secondary | ICD-10-CM | POA: Diagnosis not present

## 2020-05-20 DIAGNOSIS — I255 Ischemic cardiomyopathy: Secondary | ICD-10-CM

## 2020-05-20 DIAGNOSIS — I5022 Chronic systolic (congestive) heart failure: Secondary | ICD-10-CM

## 2020-05-20 DIAGNOSIS — N182 Chronic kidney disease, stage 2 (mild): Secondary | ICD-10-CM

## 2020-05-20 DIAGNOSIS — E1122 Type 2 diabetes mellitus with diabetic chronic kidney disease: Secondary | ICD-10-CM

## 2020-05-20 DIAGNOSIS — K7200 Acute and subacute hepatic failure without coma: Secondary | ICD-10-CM | POA: Diagnosis not present

## 2020-05-20 DIAGNOSIS — N179 Acute kidney failure, unspecified: Secondary | ICD-10-CM | POA: Diagnosis not present

## 2020-05-20 DIAGNOSIS — J9601 Acute respiratory failure with hypoxia: Secondary | ICD-10-CM

## 2020-05-20 DIAGNOSIS — G9341 Metabolic encephalopathy: Secondary | ICD-10-CM | POA: Diagnosis not present

## 2020-05-20 LAB — BRAIN NATRIURETIC PEPTIDE: B Natriuretic Peptide: >4500 pg/mL — ABNORMAL HIGH (ref 0.0–100.0)

## 2020-05-20 LAB — CBC WITH DIFFERENTIAL/PLATELET
Abs Immature Granulocytes: 0.48 10*3/uL — ABNORMAL HIGH (ref 0.00–0.07)
Basophils Absolute: 0 10*3/uL (ref 0.0–0.1)
Basophils Relative: 0 %
Eosinophils Absolute: 0 10*3/uL (ref 0.0–0.5)
Eosinophils Relative: 0 %
HCT: 27.7 % — ABNORMAL LOW (ref 36.0–46.0)
Hemoglobin: 8.6 g/dL — ABNORMAL LOW (ref 12.0–15.0)
Immature Granulocytes: 4 %
Lymphocytes Relative: 10 %
Lymphs Abs: 1.2 10*3/uL (ref 0.7–4.0)
MCH: 29 pg (ref 26.0–34.0)
MCHC: 31 g/dL (ref 30.0–36.0)
MCV: 93.3 fL (ref 80.0–100.0)
Monocytes Absolute: 1.1 10*3/uL — ABNORMAL HIGH (ref 0.1–1.0)
Monocytes Relative: 9 %
Neutro Abs: 9.1 10*3/uL — ABNORMAL HIGH (ref 1.7–7.7)
Neutrophils Relative %: 77 %
Platelets: 43 10*3/uL — ABNORMAL LOW (ref 150–400)
RBC: 2.97 MIL/uL — ABNORMAL LOW (ref 3.87–5.11)
RDW: 16.5 % — ABNORMAL HIGH (ref 11.5–15.5)
WBC: 11.8 10*3/uL — ABNORMAL HIGH (ref 4.0–10.5)
nRBC: 13.7 % — ABNORMAL HIGH (ref 0.0–0.2)

## 2020-05-20 LAB — CBC
HCT: 29.9 % — ABNORMAL LOW (ref 36.0–46.0)
Hemoglobin: 9.6 g/dL — ABNORMAL LOW (ref 12.0–15.0)
MCH: 29.8 pg (ref 26.0–34.0)
MCHC: 32.1 g/dL (ref 30.0–36.0)
MCV: 92.9 fL (ref 80.0–100.0)
Platelets: 40 10*3/uL — ABNORMAL LOW (ref 150–400)
RBC: 3.22 MIL/uL — ABNORMAL LOW (ref 3.87–5.11)
RDW: 16.9 % — ABNORMAL HIGH (ref 11.5–15.5)
WBC: 16.1 10*3/uL — ABNORMAL HIGH (ref 4.0–10.5)
nRBC: 25.8 % — ABNORMAL HIGH (ref 0.0–0.2)

## 2020-05-20 LAB — POCT I-STAT 7, (LYTES, BLD GAS, ICA,H+H)
Acid-base deficit: 7 mmol/L — ABNORMAL HIGH (ref 0.0–2.0)
Bicarbonate: 16.5 mmol/L — ABNORMAL LOW (ref 20.0–28.0)
Calcium, Ion: 1.29 mmol/L (ref 1.15–1.40)
HCT: 34 % — ABNORMAL LOW (ref 36.0–46.0)
Hemoglobin: 11.6 g/dL — ABNORMAL LOW (ref 12.0–15.0)
O2 Saturation: 99 %
Patient temperature: 97.7
Potassium: 4.7 mmol/L (ref 3.5–5.1)
Sodium: 139 mmol/L (ref 135–145)
TCO2: 17 mmol/L — ABNORMAL LOW (ref 22–32)
pCO2 arterial: 27.4 mmHg — ABNORMAL LOW (ref 32.0–48.0)
pH, Arterial: 7.385 (ref 7.350–7.450)
pO2, Arterial: 122 mmHg — ABNORMAL HIGH (ref 83.0–108.0)

## 2020-05-20 LAB — COMPREHENSIVE METABOLIC PANEL
ALT: 2320 U/L — ABNORMAL HIGH (ref 0–44)
AST: 1355 U/L — ABNORMAL HIGH (ref 15–41)
Albumin: 2.4 g/dL — ABNORMAL LOW (ref 3.5–5.0)
Alkaline Phosphatase: 91 U/L (ref 38–126)
Anion gap: 11 (ref 5–15)
BUN: 59 mg/dL — ABNORMAL HIGH (ref 8–23)
CO2: 16 mmol/L — ABNORMAL LOW (ref 22–32)
Calcium: 7.4 mg/dL — ABNORMAL LOW (ref 8.9–10.3)
Chloride: 113 mmol/L — ABNORMAL HIGH (ref 98–111)
Creatinine, Ser: 1.72 mg/dL — ABNORMAL HIGH (ref 0.44–1.00)
GFR, Estimated: 30 mL/min — ABNORMAL LOW (ref >60)
Glucose, Bld: 149 mg/dL — ABNORMAL HIGH (ref 70–99)
Potassium: 3.5 mmol/L (ref 3.5–5.1)
Sodium: 140 mmol/L (ref 135–145)
Total Bilirubin: 3.8 mg/dL — ABNORMAL HIGH (ref 0.3–1.2)
Total Protein: 4.7 g/dL — ABNORMAL LOW (ref 6.5–8.1)

## 2020-05-20 LAB — TYPE AND SCREEN
ABO/RH(D): B POS
Antibody Screen: NEGATIVE

## 2020-05-20 LAB — PROTEIN / CREATININE RATIO, URINE
Creatinine, Urine: 172.98 mg/dL
Protein Creatinine Ratio: 0.55 mg/mg{Cre} — ABNORMAL HIGH (ref 0.00–0.15)
Total Protein, Urine: 96 mg/dL

## 2020-05-20 LAB — DIC (DISSEMINATED INTRAVASCULAR COAGULATION)PANEL
D-Dimer, Quant: >20 ug/mL-FEU — ABNORMAL HIGH (ref 0.00–0.50)
Fibrinogen: <60 mg/dL — CL (ref 210–475)
INR: 5.2 (ref 0.8–1.2)
Platelets: 44 10*3/uL — ABNORMAL LOW (ref 150–400)
Prothrombin Time: 46.2 seconds — ABNORMAL HIGH (ref 11.4–15.2)
aPTT: 49 seconds — ABNORMAL HIGH (ref 24–36)

## 2020-05-20 LAB — GLUCOSE, CAPILLARY
Glucose-Capillary: 144 mg/dL — ABNORMAL HIGH (ref 70–99)
Glucose-Capillary: 167 mg/dL — ABNORMAL HIGH (ref 70–99)
Glucose-Capillary: 179 mg/dL — ABNORMAL HIGH (ref 70–99)
Glucose-Capillary: 202 mg/dL — ABNORMAL HIGH (ref 70–99)
Glucose-Capillary: 218 mg/dL — ABNORMAL HIGH (ref 70–99)

## 2020-05-20 LAB — PROCALCITONIN: Procalcitonin: 0.42 ng/mL

## 2020-05-20 LAB — PHOSPHORUS: Phosphorus: 3.3 mg/dL (ref 2.5–4.6)

## 2020-05-20 LAB — STREP PNEUMONIAE URINARY ANTIGEN: Strep Pneumo Urinary Antigen: NEGATIVE

## 2020-05-20 LAB — MAGNESIUM: Magnesium: 1.7 mg/dL (ref 1.7–2.4)

## 2020-05-20 LAB — SAVE SMEAR(SSMR), FOR PROVIDER SLIDE REVIEW

## 2020-05-20 LAB — CREATININE, URINE, RANDOM: Creatinine, Urine: 175.11 mg/dL

## 2020-05-20 LAB — FIBRINOGEN: Fibrinogen: 108 mg/dL — ABNORMAL LOW (ref 210–475)

## 2020-05-20 LAB — PROTIME-INR
INR: 2.7 — ABNORMAL HIGH (ref 0.8–1.2)
Prothrombin Time: 27.4 seconds — ABNORMAL HIGH (ref 11.4–15.2)

## 2020-05-20 LAB — APTT: aPTT: 35 seconds (ref 24–36)

## 2020-05-20 LAB — CORTISOL: Cortisol, Plasma: 38.1 ug/dL

## 2020-05-20 LAB — PATHOLOGIST SMEAR REVIEW: Path Review: 2142022

## 2020-05-20 LAB — LIPASE, BLOOD: Lipase: 45 U/L (ref 11–51)

## 2020-05-20 LAB — SODIUM, URINE, RANDOM: Sodium, Ur: 51 mmol/L

## 2020-05-20 LAB — LACTIC ACID, PLASMA: Lactic Acid, Venous: 3 mmol/L (ref 0.5–1.9)

## 2020-05-20 LAB — AMYLASE: Amylase: 187 U/L — ABNORMAL HIGH (ref 28–100)

## 2020-05-20 LAB — ACETAMINOPHEN LEVEL: Acetaminophen (Tylenol), Serum: <10 ug/mL — ABNORMAL LOW (ref 10–30)

## 2020-05-20 LAB — TROPONIN I (HIGH SENSITIVITY): Troponin I (High Sensitivity): 290 ng/L (ref <18)

## 2020-05-20 MED ORDER — VANCOMYCIN VARIABLE DOSE PER UNSTABLE RENAL FUNCTION (PHARMACIST DOSING): Status: DC

## 2020-05-20 MED ORDER — LACTATED RINGERS IV BOLUS
1000.0000 mL | Freq: Once | INTRAVENOUS | Status: AC
  Administered 2020-05-20: 1000 mL via INTRAVENOUS

## 2020-05-20 MED ORDER — PERFLUTREN LIPID MICROSPHERE
1.0000 mL | INTRAVENOUS | Status: AC | PRN
  Administered 2020-05-20: 2.5 mL via INTRAVENOUS
  Filled 2020-05-20: qty 10

## 2020-05-20 MED ORDER — SODIUM CHLORIDE 0.9% IV SOLUTION: Freq: Once | INTRAVENOUS | Status: AC

## 2020-05-20 MED ORDER — LATANOPROST 0.005 % OP SOLN
1.0000 [drp] | Freq: Every day | OPHTHALMIC | Status: DC
  Administered 2020-05-20 – 2020-05-23 (×4): 1 [drp] via OPHTHALMIC
  Filled 2020-05-20: qty 2.5

## 2020-05-20 MED ORDER — VANCOMYCIN HCL 1500 MG/300ML IV SOLN
1500.0000 mg | Freq: Once | INTRAVENOUS | Status: AC
  Administered 2020-05-20: 1500 mg via INTRAVENOUS
  Filled 2020-05-20: qty 300

## 2020-05-20 MED ORDER — ORAL CARE MOUTH RINSE
15.0000 mL | Freq: Two times a day (BID) | OROMUCOSAL | Status: DC
  Administered 2020-05-20 – 2020-05-22 (×5): 15 mL via OROMUCOSAL

## 2020-05-20 MED ORDER — PROSOURCE TF PO LIQD
45.0000 mL | Freq: Two times a day (BID) | ORAL | Status: DC
  Administered 2020-05-20: 45 mL
  Filled 2020-05-20: qty 45

## 2020-05-20 MED ORDER — SODIUM CHLORIDE 0.9 % IV SOLN
2.0000 g | INTRAVENOUS | Status: DC
  Administered 2020-05-20 – 2020-05-21 (×2): 2 g via INTRAVENOUS
  Filled 2020-05-20 (×2): qty 2

## 2020-05-20 MED ORDER — SODIUM ZIRCONIUM CYCLOSILICATE 5 G PO PACK
10.0000 g | PACK | Freq: Three times a day (TID) | ORAL | Status: DC
  Administered 2020-05-20: 10 g
  Filled 2020-05-20: qty 2

## 2020-05-20 MED ORDER — LACTULOSE 10 GM/15ML PO SOLN
30.0000 g | Freq: Two times a day (BID) | ORAL | Status: DC
  Administered 2020-05-20 (×2): 30 g
  Filled 2020-05-20 (×3): qty 45

## 2020-05-20 MED ORDER — POTASSIUM CHLORIDE 20 MEQ PO PACK
40.0000 meq | PACK | Freq: Once | ORAL | Status: AC
  Administered 2020-05-20: 40 meq
  Filled 2020-05-20: qty 2

## 2020-05-20 MED ORDER — VITAMIN K1 10 MG/ML IJ SOLN
10.0000 mg | Freq: Once | INTRAVENOUS | Status: AC
  Administered 2020-05-20: 10 mg via INTRAVENOUS
  Filled 2020-05-20: qty 1

## 2020-05-20 MED ORDER — SODIUM CHLORIDE 0.9% FLUSH
10.0000 mL | Freq: Two times a day (BID) | INTRAVENOUS | Status: DC
  Administered 2020-05-20 – 2020-05-24 (×7): 10 mL

## 2020-05-20 MED ORDER — MAGNESIUM SULFATE 2 GM/50ML IV SOLN
2.0000 g | Freq: Once | INTRAVENOUS | Status: AC
  Administered 2020-05-20: 2 g via INTRAVENOUS
  Filled 2020-05-20: qty 50

## 2020-05-20 MED ORDER — DOCUSATE SODIUM 50 MG/5ML PO LIQD: 100.0000 mg | Freq: Two times a day (BID) | ORAL | Status: DC | PRN

## 2020-05-20 MED ORDER — SODIUM CHLORIDE 0.9% FLUSH: 10.0000 mL | INTRAVENOUS | Status: DC | PRN

## 2020-05-20 MED ORDER — POLYETHYLENE GLYCOL 3350 17 G PO PACK: 17.0000 g | PACK | Freq: Every day | ORAL | Status: DC | PRN

## 2020-05-20 MED ORDER — OSMOLITE 1.5 CAL PO LIQD
1000.0000 mL | ORAL | Status: DC
  Administered 2020-05-20 – 2020-05-23 (×3): 1000 mL
  Filled 2020-05-20 (×6): qty 1000

## 2020-05-20 MED ORDER — VITAL HIGH PROTEIN PO LIQD
1000.0000 mL | ORAL | Status: DC
  Administered 2020-05-20: 1000 mL

## 2020-05-20 MED ORDER — CHLORHEXIDINE GLUCONATE 0.12 % MT SOLN
15.0000 mL | Freq: Two times a day (BID) | OROMUCOSAL | Status: DC
  Administered 2020-05-20 – 2020-05-24 (×9): 15 mL via OROMUCOSAL
  Filled 2020-05-20 (×2): qty 15

## 2020-05-20 MED ORDER — LIP MEDEX EX OINT
TOPICAL_OINTMENT | CUTANEOUS | Status: DC | PRN
  Filled 2020-05-20: qty 7

## 2020-05-20 NOTE — Progress Notes (Signed)
STROKE TEAM PROGRESS NOTE   INTERVAL HISTORY I personally reviewed history of presenting illness, electronic medical records and imaging films in PACS.  She presented with unresponsiveness and right-sided weakness with has been found to have severe appearing encephalopathy and renal insufficiency.  She has noticed slight improvement in the mental status since admission and focal weakness appears to have improved.  Vital signs stable.  CT scan of the head shows no acute abnormality but shows chronic right parietal and left occipital infarcts with encephalomalacia.  MRI scan was ordered but is on hold due to her condition and lack of obtaining IV and labs.  Vitals:   05/20/20 1300 05/20/20 1400 05/20/20 1500 05/20/20 1547  BP: (!) 150/88 140/79 (!) 151/82   Pulse: 90 92 92   Resp: 19 10 (!) 21   Temp:    (!) 96.7 F (35.9 C)  TempSrc:    Oral  SpO2: 98% 92% 95%   Weight:      Height:       CBC:  Recent Labs  Lab 05/31/2020 1120 05/08/2020 1127 05/20/20 0256 05/20/20 1243 05/20/20 1244  WBC 12.7*  --   --   --  11.8*  NEUTROABS 10.2*  --   --   --  9.1*  HGB 10.8*   < > 11.6*  --  8.6*  HCT 35.0*   < > 34.0*  --  27.7*  MCV 93.1  --   --   --  93.3  PLT 87*  --   --  44* 43*   < > = values in this interval not displayed.   Basic Metabolic Panel:  Recent Labs  Lab 05/11/2020 1120 06/03/2020 1127 05/20/20 0256 05/20/20 1244  NA 141   < > 139 140  K 5.4*   < > 4.7 3.5  CL 110  --   --  113*  CO2 14*  --   --  16*  GLUCOSE 144*  --   --  149*  BUN 62*  --   --  59*  CREATININE 2.48*  --   --  1.72*  CALCIUM 9.8  --   --  7.4*  MG  --   --   --  1.7  PHOS  --   --   --  3.3   < > = values in this interval not displayed.    Lipid Panel:  Recent Labs  Lab 05/21/2020 1120  CHOL 155  TRIG 97  HDL 34*  CHOLHDL 4.6  VLDL 19  LDLCALC 102*    HgbA1c: pending Urine Drug Screen: pending Alcohol Level No results for input(s): ETH in the last 168 hours.  IMAGING past 24 hours DG  Abd 1 View  Result Date: 05/15/2020 CLINICAL DATA:  Nasogastric placement EXAM: ABDOMEN - 1 VIEW COMPARISON:  CT same day FINDINGS: Orogastric or nasogastric tube enters the abdomen with its tip in the distal body or antrum of the stomach. IMPRESSION: Orogastric or nasogastric tube tip in the distal body or antrum of the stomach. Electronically Signed   By: Nelson Chimes M.D.   On: 06/03/2020 22:32   DG Chest Port 1 View  Result Date: 05/20/2020 CLINICAL DATA:  Encephalopathy. EXAM: PORTABLE CHEST 1 VIEW COMPARISON:  Chest x-ray 05/26/2020, CT chest 04/15/2020 FINDINGS: Left chest wall cardiac defibrillator in similar position. Sternotomy wires and cardiac surgical changes are again noted to overlie the mediastinum. Enteric tube courses below the hemidiaphragm with side port overlying the expected region  of the gastric lumen and collimated off view. The heart size and mediastinal contours are unchanged. Lingular linear atelectasis. No focal consolidation. No pulmonary edema. No pleural effusion. No pneumothorax. No acute osseous abnormality. IMPRESSION: No active disease. Electronically Signed   By: Iven Finn M.D.   On: 05/20/2020 05:29   US LIVER DOPPLER  Result Date: 05/29/2020 CLINICAL DATA:  Liver failure EXAM: DUPLEX ULTRASOUND OF LIVER TECHNIQUE: Color and duplex Doppler ultrasound was performed to evaluate the hepatic in-flow and out-flow vessels. COMPARISON:  05/28/2020 CT without contrast FINDINGS: Liver: Increased hepatic echogenicity subcapsular left hepatic lobe anechoic cyst measures 1.6 cm. No other significant abnormality or intrahepatic biliary dilatation. Main Portal Vein size: 0.9 cm Portal Vein Velocities Main Prox:  13 cm/sec Main Mid: 16 cm/sec Main Dist:  17 cm/sec Right: 9 cm/sec Left: 18 cm/sec Hepatic Vein Velocities Right:  30 cm/sec Middle:  68 cm/sec Left:  61 cm/sec IVC: Present and patent with normal respiratory phasicity. Hepatic Artery Velocity:  41 cm/sec Splenic Vein  Velocity:  17 cm/sec Spleen: 3.5 cm x 6.8 cm x 7.1 cm with a total volume of 86 cm^3 (411 cm^3 is upper limit normal) Portal Vein Occlusion/Thrombus: No Splenic Vein Occlusion/Thrombus: No Ascites: None Varices: None Patent portal, hepatic and splenic veins with normal directional flow. No portal vein occlusion or thrombus. No free fluid or ascites. IMPRESSION: Patent hepatic venous system.  No veno-occlusive process. Electronically Signed   By: Jerilynn Mages.  Shick M.D.   On: 05/16/2020 18:04   [place] EKG SITE RITE  Result Date: 05/20/2020 If Site Rite image not attached, placement could not be confirmed due to current cardiac rhythm.  05/12/2020 Echo 1. Left ventricle cavity is mildly dilated. Normal left ventricular wall thickness. Severe global hypokinesis, LVEF 20%. Spontaneous echo contrast seen. Cannot rule out thrombus on non-contrast study. Indeterminate diastolic filling pattern due to E/A fusion. Calculated EF 20%. 2. Left atrial cavity is severely dilated. 3. Moderate (Grade II) mitral regurgitation. 4. Moderate tricuspid regurgitation. Moderate pulmonary hypertension. Estimated pulmonary artery systolic pressure 54 mmHg. RVSP measures 54 mmHg. 5. Mild pulmonic regurgitation. 6. Compared to previous study on 10/03/2019, LVEF has further reduced from 25-30%. No other significant change noted.   February 05, CT Head Result date: 05/21/2020 IMPRESSION: 1. Stable brain. No acute cortically based infarct or acute intracranial hemorrhage identified. ASPECTS 10. 2. Chronic right parietal and left occipital cortical Encephalomalacia.  PHYSICAL EXAM Frail elderly African-American lady who appears not in distress. . Afebrile. Head is nontraumatic. Neck is supple without bruit.    Cardiac exam no murmur or gallop. Lungs are clear to auscultation. Distal pulses are well felt. Neurological Exam : Patient is drowsy but can arouse easily.  She follows simple midline and one-step commands.  She is  oriented to person and place but not to time.  There is easy distractibility and diminished attention and registration.  Speech appears slightly nonfluent but clear without dysarthria or aphasia.  She is able to follow simple midline and one-step commands.  Extraocular movements appear full range.  She blinks to threat bilaterally.  Face appears symmetric.  Tongue midline.  Motor system exam is limited due to lack of cooperation from patient but she is able to move all 4 extremities well though she is appears to move the right arm and right leg less than the left side but her cooperation is limited. ASSESSMENT/PLAN Lynn Carey is a 80 y.o. female with history of asthma, congestive heart failure s/p ICD  placement, coronary artery disease, diabetes mellitus type 2, hypertension, and history of carotid artery occlusion on home clopidogrel who presents to [facility] 2/13 via EMS as a stroke alert due to decreased responsiveness and right-sided weakness.   Encephalopathy  Fluctuating mental status likely toxic metabolic etiology from ARF and hepatic failure  CT head to evaluate for stroke:  no cortically based infarct or acute intracranial hemorrhage identified. ASPECTS 10    CTA head & neck: unable to obtain due to elevation in BUN and creatinine  MRA head/neck WO contrast: pending  MRI pending, pending patient with ICD in place.  Carotid Artery duplex 05/08/2020: stenosis in right internal carotid artery (50-69%), Stenosis in the left internal carotid artery (>=70%).  Carotid Doppler  pending  2D Echo pending   Recent echo 05/12/2020: Severe global hypokinesis, EF 20%, Grade II mitral regurgitation, Moderate tricuspid regurgitation  LDL 102  HgbA1c 5.6  VTE prophylaxis - hold, PTT >200, PLT 43 INR 5.2  Plavix '75mg'$  prior to admission, plan for DAPT  With aspirin '81mg'$  and Plavix '75mg'$  for 3 weeks and then continue Plavix alone if MRI negative for hemorrhage or new infarct and once appropriate  with hepatic failure and anemia standpoint  Therapy recommendations:  pending  Disposition:  pending  Hypertension  Home meds: metoprolol '100mg'$  daily   Stable . Long-term BP goal normotensive  Hyperlipidemia  Home meds: crestor and zetia, not resumed in hospital  LDL 102, goal < 70  Holding statin due to hepatic failure  Continue statin at discharge  Diabetes type II Controlled  Home meds:  Junuvia '50mg'$  daily  HgbA1c 5.6, goal < 7.0  CBGs Recent Labs    05/20/20 0802 05/20/20 1252 05/20/20 1548  GLUCAP 167* 144* 179*      SSI  Other Stroke Risk Factors  Advanced Age >/= 32   Cigarette smoker (former) quit in1985  Overweight, Body mass index is 27.08 kg/m., BMI >/= 30 associated with increased stroke risk, recommend weight loss, diet and exercise as appropriate   Coronary artery disease  MI in 1997, 2009  Obstructive sleep apnea  Congestive heart failure diagnosed on 09/19/2018  Carotid artery occlusion   Other Active Problems  Hospital day # 1 I have personally obtained history,examined this patient, reviewed notes, independently viewed imaging studies, participated in medical decision making and plan of care.ROS completed by me personally and pertinent positives fully documented  I have made any additions or clarifications directly to the above note. Agree with note above.  Patient presented with depressed sensorium and right sided weakness in the setting of severe metabolic encephalopathy from severe acute hepatic failure and renal insufficiency.  CT scan does show old strokes but no acute findings.  MRI would be beneficial in evaluation of acute strokes were given her general medical condition she needs to be stabilized first before MRI can be done.  Given her abnormal coagulation and liver function labs recommend to not use aspirin or statin at the present time.  From discussion with Dr. Noemi Chapel critical care MD and answered questions.  No family  available for discussion at the bedside.  Stroke team will follow from a distance kindly call for questions. This patient is critically ill and at significant risk of neurological worsening, death and care requires constant monitoring of vital signs, hemodynamics,respiratory and cardiac monitoring, extensive review of multiple databases, frequent neurological assessment, discussion with family, other specialists and medical decision making of high complexity.I have made any additions or clarifications directly to  the above note.This critical care time does not reflect procedure time, or teaching time or supervisory time of PA/NP/Med Resident etc but could involve care discussion time.  I spent 30 minutes of neurocritical care time  in the care of  this patient.     Antony Contras, MD Medical Director [facility] Pager: [phone] 05/20/2020 4:55 PM   To contact Stroke Continuity provider, please refer to [email or web address] After hours, contact General Neurology

## 2020-05-20 NOTE — Progress Notes (Signed)
END OF SHIFT NOTE:  Neuro: Patient resting in bed near end of shift. A&Ox4. Patient able to follow command and answer questions appropriately. Patient will have delayed responses upon initial waking up.   Cardiac: NSR throughout shift. Patient does have ICD SBP in 140's, patient does take metoprolol at home, map maintained >65 throughout shift. Vitals stable throughout night.   Respiratory: patient on room air with saturations >93% throughout shift. Patient does have productive cough and has been educated on how to use suction equipment. Tan sputum noted with cough.   GI/GU: NG tube placed in right nare, verified by x-ray during shift in order to take medications. Plan is for patient to have a swallow study as patient does report difficulty swallowing. Patient given lactulose before arrival to unit due to elevated ammonia level. Patient is passing gas yet has not had sufficient BM. Bowel regimen ordered. Pure wick in place. Patient did have 1 large unmeasured urine since arrival to unit.   Skin: patients skin in tact. Old scars from prior CABG and hip replacements as well as pacemaker placement.   Access: 1 PIV 22g right FA. Fluids infusing per MAR.   Lab has attempted multiple times to collect labs, patient very difficult stick and it has been difficult to draw back blood on patient. May place CVC if needed per MD on day shift. Plan for MRI of head w/o contrast.   Safety measures remain in place and call bell within reach. Will continue to monitor and SBARR to day shift nurse at change of shift.

## 2020-05-20 NOTE — Progress Notes (Signed)
Pharmacy Antibiotic Note  Lynn Carey is a 80 y.o. female admitted on 05/14/2020 with sepsis.  Pharmacy has been consulted for vancomycin and cefepime dosing. Per CCM, to cover empirically for MRSA and pseudomonas due to leukocytosis and hypothermia on admit. No known sources at this time; CT AP and CT head were unremarkable. MRSA PCR negative; however, no concern for pneumonia at this time.  Will start cefepime 2g q24hr per current renal function; AM CMet pending. Will load with '1500mg'$  vancomycin (~24 mg/kg) and follow renal function for further dosing. CCM requested only one-time vancomycin dose this morning during rounds.   Plan: Start cefepime 2g q24hr Give vancomycin '1500mg'$  x1 Follow renal function, cultures, and overall clinical status  Height: 5' (152.4 cm) Weight: 62.9 kg (138 lb 10.7 oz) (bed scale) IBW/kg (Calculated) : 45.5  Temp (24hrs), Avg:97.5 F (36.4 C), Min:96.7 F (35.9 C), Max:98 F (36.7 C)  Recent Labs  Lab 05/09/2020 0856 06/02/2020 1120  WBC  --  12.7*  CREATININE 2.40* 2.48*  LATICACIDVEN  --  3.2*    Estimated Creatinine Clearance: 15.2 mL/min (A) (by C-G formula based on SCr of 2.48 mg/dL (H)).    Allergies  Allergen Reactions  . Lisinopril Cough    Antimicrobials this admission: Cefepime 2/14 >> Vancomycin 2/14 x1  Microbiology results: 2/14 Bcx sent 2/14 Ucx sent  2/13 MRSA PCR negative  Thank you for allowing pharmacy to be a part of this patient's care.  Alfonse Spruce, PharmD PGY2 ID Pharmacy Resident Phone between 7 am - 3:30 pm: [phone]  Please check AMION for all [place] phone numbers After 10:00 PM, call [place] [phone]  05/20/2020 9:17 AM

## 2020-05-20 NOTE — Progress Notes (Signed)
Pharmacy Electrolyte Replacement  Recent Labs:  Recent Labs    05/20/20 1244  K 3.5  MG 1.7  PHOS 3.3  CREATININE 1.72*    Plan:  -K 3.5, to replace with KCl 40 mEq x1 -Mg 1.7, to replace with mag sulfate 2g x1  Thank you,  Alfonse Spruce, PharmD PGY2 ID Pharmacy Resident Phone between 7 am - 3:30 pm: [phone]  Please check AMION for all [place] phone numbers After 10:00 PM, call [place] [phone]
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
## 2020-05-20 NOTE — Plan of Care (Signed)
Updated daughter Linette at bedside. She understands that I still do not have a diagnosis of what is causing her liver failure and I am concerned this may not be a reversible condition. I recommend her sister from [place] come to town if she would want to see her in case she takes a turn for the worst in the coming days. Tentatively planning for family meeting tomorrow with all 3 sisters.   Julian Hy, DO 05/20/20 5:25 PM [facility]  From 7AM- 7PM if no response to pager, please call [phone]. After hours, 7PM- 7AM, please call [place]  [phone].

## 2020-05-20 NOTE — Progress Notes (Signed)
  Echocardiogram 2D Echocardiogram has been performed.  Lynn Carey 05/20/2020, 1:44 PM

## 2020-05-20 NOTE — Progress Notes (Signed)
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  She also has bilateral asymptomatic high-grade carotid stenosis 70%, hyperlipidemia stage III chronic kidney disease asthmatic bronchitis not otherwise specified and hypertension.  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  In July 2021 third of had stroke in the right eye by ophthalmologist.  CT angiogram of the neck at that time revealed moderate disease in bilateral internal carotid arteries.  She was admitted for 2 days between April 15, 2020 and April 17, 2020 with slurred speech and balance issues.  Her work-up was unremarkable especially MRI brain and MRI lumbar spine.  There is hypertension at the time of admission.  Multiple medications were discontinued.  According to the daughter after reaching home because of chronic arthralgia she is taking Tylenol scheduled likely 650 mg 4 times a day along with tramadol.  Her liver function tests are normal but by May 03 2020-week after discharge she had transaminitis documented below.  A week later the LFTs seem to improve.  Bilirubin was normal all along.  On May 07, 2020 she did have a fall.  On May 15, 2020 she followed up with Dr. Einar Gip cardiology who lowered her Lopressor.  Then May 19, 2020 she was  brought in with change in mental status and confusion waxing and waning status.  At this time history of abdominal pain for few weeks was also noticed.  Her liver function test was grossly abnormal again.  AST ALT were too high to calculate bilirubin of 3.1 and INR of 4.  CT without contrast showed no acute pathology.  Family categorically denied any Tylenol intake in the last 3 weeks.  In fact he said many of the medications have been reduced.  Initially admitted to the hospitalist service but then given coagulopathy and anticipated worsening critical care medicine asked to admit.  GI does not believe patient be a good liver transplant candidate  Past Medical History:    has a past medical history of Arthritis, Asthma, Carotid artery occlusion, Chronic systolic heart failure ([place]) ([id number]), Complication of anesthesia, Coronary artery disease, Diabetes mellitus without complication ([place]), GERD (gastroesophageal reflux disease), Hypertension, ICD Single chamber Medtronic VISIA MRI VR [id number] 04/21/2019 (04/21/2019), and PONV (postoperative nausea and vomiting).   reports that she quit smoking about 36 years ago. Her smoking use included cigarettes. She has a 1.00 pack-year smoking history. She has never used smokeless tobacco.  Significant Hospital Events:  05/29/2020 - admit   Consults:  GI Neuro  Procedures:    Significant Diagnostic Tests:  2/13 - Doppler Liver> patent hepatic venous system 2/13 CT abd pelvis> no acute findings 2/13 CT head > no acute abnormalities  Micro Data:    Interim History / Subjective:  Not able to collect blood for labs overnight.  Objective   Blood pressure (!) 148/98, pulse 90, temperature (!) 96.7 F (35.9 C), temperature  source Axillary, resp. rate 20, height 5' (1.524 m), weight 62.9 kg, SpO2 100 %.        Intake/Output Summary (Last 24 hours) at 05/20/2020 [id number] Last data filed at 05/20/2020 0700 Gross per 24 hour  Intake 386.31 ml  Output 700  ml  Net -313.69 ml   Filed Weights   05/30/2020 0918 05/18/2020 2000 05/20/20 0500  Weight: 64.3 kg 62.9 kg 62.9 kg    Examination: General: ill appearing woman laying in bed in NAD, appears stated age, sleepy but arousable HENT: [place]/AT, eyes anicteric Lungs: CTAB, breathing comfortably on [place] Cardiovascular: RRR, no murmurs Abdomen: soft, NT Extremities: no peripheral edema, no cyanosis Derm: mild skin tenting, warm, dry Neuro: Speaking in short phrases, not particularly conversant. Able to hold out hands but cannot maintain arm and wrist extension. Moves all extremities spontaneously.   Resolved Hospital Problem list     Assessment & Plan:  Acute liver injury with hepatic encephalopathy and coagulopathy, unknown etiology. Previously was on moderate dose tylenol, but stopped weeks ago. Also was on mirtazapine which has been associated with DILI.  -con't NAC -PPI IV -vitamin K -appreciate GI's assistance -placing PICC to ensure we can get blood draws; discussed with family who prefer PICC to CVC, understanding that this would limit her in the future if she required dialysis -will follow up labs once blood is drawn  Acute metabolic encephalopathy due to liver injury, AKI -con't lactulose -con't ICU monitoring for mental status -NGT in place; start TF  AKI on CKD - concern for acute cardiorenal vs hepatorenal syndrome. FENa 0.6= prerenal Hyperkalemia -1L LR -con't to monitor -strict I/Os -renally dose meds, avoid nephrotoxic meds -may need more lokelma  Lactic acidosis - likey due to liver issues -waiting on repeat lactate -ANCA and GBM antibodies ordered -1L LR this morning given pre-renal indices and appearance of hypovolemia  Acute hypoxic respiratory failure -wean supplemental O2 as able -monitor for ability to protect airway; may need intubation if progressive encephalopathy    Chronic HFrEF, dilated LA, moderate MR, moderate PH, moderate TR. LVEF 20%. AICD in  place. -repeat echo pending -monitor on telemetry -goal euvolemia  Hyperglycemia -SSI PRN -goal BG 140-180 if requiring insulin  Coagulopathy due to liver failure Acute anemia due to acute illness, liver failure Acute thrombocytopenia- likely due to liver failure -serial CBCs -follow up INR; likely will need more vitamin K, may require FFP -transfuse for Hb<7 or hemodynamically significant bleeding -DIC panel, ADAMTS13 pending  Leukocytosis with neutrophilia. With hypothermia at presentation, will treat empirically for sepsis but no known source. Low suspicoin for infection overall. -blood cultures, urine culture -empiric cefepime and vanc; discussed with pharmacy  Previous strokes 70% bilateral Carotid stenosis Feb 2022 (Dr Einar Gip)  -MAP goal > 65 - abroutp BP reductions puts her at risk for stroke    Best practice (evaluated daily)  Diet: NPO, TF Pain/Anxiety/Delirium protocol (if indicated):  VAP protocol (if indicated):  DVT prophylaxis: scd GI prophylaxis: ppo Glucose control: ssi Mobility: bed rest Disposition: ICU  Goals of Care:   Multi-Disciplinary Goals of Care Discussion Family Updates: 2/14- update both daughters via phone   Tensed  Lab 06/02/2020 0856 05/29/2020 1127 05/20/20 0256  PHART  --  7.375 7.385  PCO2ART  --  28.2* 27.4*  PO2ART  --  98 122*  HCO3  --  16.5* 16.5*  TCO2 18* 17* 17*  O2SAT  --  98.0 99.0  CBC Recent Labs  Lab 05/26/2020 1120 05/18/2020 1127 05/20/20 0256  HGB 10.8* 11.6* 11.6*  HCT 35.0* 34.0* 34.0*  WBC 12.7*  --   --   PLT 87*  --   --     COAGULATION Recent Labs  Lab 05/22/2020 1120  INR 4.0*    CARDIAC  No results for input(s): TROPONINI in the last 168 hours. No results for input(s): PROBNP in the last 168 hours.   CHEMISTRY Recent Labs  Lab 05/17/2020 0856 05/23/2020 1120 05/23/2020 1127 05/20/20 0256  NA 140 141 140 139  K 5.8* 5.4* 5.3* 4.7  CL 112* 110  --   --   CO2   --  14*  --   --   GLUCOSE 141* 144*  --   --   BUN 65* 62*  --   --   CREATININE 2.40* 2.48*  --   --   CALCIUM  --  9.8  --   --    Estimated Creatinine Clearance: 15.2 mL/min (A) (by C-G formula based on SCr of 2.48 mg/dL (H)).   LIVER Recent Labs  Lab 05/07/2020 1120  AST 3,748*  RESULTS UNAVAILABLE DUE TO INTERFERING SUBSTANCE  ALT 3,802*  RESULTS UNAVAILABLE DUE TO INTERFERING SUBSTANCE  ALKPHOS 120  BILITOT 3.1*  PROT 6.3*  ALBUMIN 3.4*  INR 4.0*     INFECTIOUS Recent Labs  Lab 05/10/2020 1120  LATICACIDVEN 3.2*     ENDOCRINE CBG (last 3)  Recent Labs    06/02/2020 0844 05/08/2020 2028 05/20/20 0802  GLUCAP 127* 116* 167*      This patient is critically ill with multiple organ system failure which requires frequent high complexity decision making, assessment, support, evaluation, and titration of therapies. This was completed through the application of advanced monitoring technologies and extensive interpretation of multiple databases. During this encounter critical care time was devoted to patient care services described in this note for 51 minutes.  Julian Hy, DO 05/20/20 9:15 AM [facility]  From 7AM- 7PM if no response to pager, please call [phone]. After hours, 7PM- 7AM, please call Elink  [phone].

## 2020-05-20 NOTE — Progress Notes (Signed)
eLink Physician-Brief Progress Note Patient Name: Lynn Carey DOB: 12-Apr-1940 MRN: [id number]   Date of Service  05/20/2020  HPI/Events of Note  INR = 2.7, PTT = 35, Fibrinogen = 1.8 and Platelets = 40K in setting of liver disease.   eICU Interventions  Plan: 1. Transfuse 2 units FFP now.  2. Repeat PT/INR and Fibrinogen in AM.      Intervention Category Major Interventions: Other:  Lysle Dingwall 05/20/2020, 10:40 PM

## 2020-05-20 NOTE — Progress Notes (Signed)
Peripherally Inserted Central Catheter Placement  The IV Nurse has discussed with the patient and/or persons authorized to consent for the patient, the purpose of this procedure and the potential benefits and risks involved with this procedure.  The benefits include less needle sticks, lab draws from the catheter, and the patient may be discharged home with the catheter. Risks include, but not limited to, infection, bleeding, blood clot (thrombus formation), and puncture of an artery; nerve damage and irregular heartbeat and possibility to perform a PICC exchange if needed/ordered by physician.  Alternatives to this procedure were also discussed.  Bard Power PICC patient education guide, fact sheet on infection prevention and patient information card has been provided to patient /or left at bedside.    PICC Placement Documentation  PICC Double Lumen [id number] PICC Right Basilic 38 cm 1 cm (Active)  Indication for Insertion or Continuance of Line Poor Vasculature-patient has had multiple peripheral attempts or PIVs lasting less than 24 hours 05/20/20 1200  Exposed Catheter (cm) 1 cm 05/20/20 1200  Site Assessment Clean;Dry;Intact 05/20/20 1200  Lumen #1 Status Flushed;Saline locked;Blood return noted 05/20/20 1200  Lumen #2 Status Flushed;Saline locked;Blood return noted 05/20/20 1200  Dressing Type Transparent;Securing device 05/20/20 1200  Dressing Status Clean;Dry;Intact 05/20/20 1200  Antimicrobial disc in place? Yes 05/20/20 1200  Safety Lock Not Applicable [id number]  Line Care Connections checked and tightened 05/20/20 1200  Dressing Intervention New dressing 05/20/20 1200  Dressing Change Due 05/27/20 05/20/20 1200       Holley Bouche Renee 05/20/2020, 12:13 PM

## 2020-05-20 NOTE — Progress Notes (Signed)
Subjective: [name] is a 80 year old black female with multiple medical problems including recent stroke complicated by Stage III chronic kidney disease and asthmatic bronchitis who presents with hepatic encephalopathy and altered mental status. Additionally she has had a hip replacement in the past along with a CABG in 1997 and has has history of nonischemic and ischemic cardiomyopathy with ejection fraction of 20% with a single chamber Medtronic ICD implanted done in January 2021  Objective: Vital signs in last 24 hours: Temp:  [96.7 F (35.9 C)-98 F (36.7 C)] 96.7 F (35.9 C) (02/14 1547) Pulse Rate:  [81-94] 92 (02/14 1500) Resp:  [10-40] 21 (02/14 1500) BP: (129-160)/(75-101) 151/82 (02/14 1500) SpO2:  [92 %-100 %] 95 % (02/14 1500) Weight:  [62.9 kg] 62.9 kg (02/14 0500) Last BM Date:  (PTA)  Intake/Output from previous day: 02/13 0701 - 02/14 0700 In: 386.3 [I.V.:291.3; NG/GT:45; IV Piggyback:50] Out: 700 [Urine:700] Intake/Output this shift: Total I/O In: 164.3 [I.V.:137.4; IV Piggyback:26.9] Out: -   General appearance: appears older than stated age, fatigued, no distress and slowed mentation Resp: clear to auscultation bilaterally Cardio: regular rate and rhythm, S1, S2 normal, no murmur, click, rub or gallop GI: soft, non-tender; bowel sounds normal; no masses,  no organomegaly Extremities: extremities normal, atraumatic, no cyanosis or edema  Lab Results: Recent Labs    05/25/2020 1120 05/29/2020 1127 05/20/20 0256 05/20/20 1243 05/20/20 1244  WBC 12.7*  --   --   --  11.8*  HGB 10.8* 11.6* 11.6*  --  8.6*  HCT 35.0* 34.0* 34.0*  --  27.7*  PLT 87*  --   --  44* 43*   BMET Recent Labs    05/16/2020 0856 05/16/2020 1120 05/29/2020 1127 05/20/20 0256 05/20/20 1244  NA 140 141 140 139 140  K 5.8* 5.4* 5.3* 4.7 3.5  CL 112* 110  --   --  113*  CO2  --  14*  --   --  16*  GLUCOSE 141* 144*  --   --  149*  BUN 65* 62*  --   --  59*  CREATININE 2.40*  2.48*  --   --  1.72*  CALCIUM  --  9.8  --   --  7.4*   LFT Recent Labs    05/20/20 1244  PROT 4.7*  ALBUMIN 2.4*  AST 1,355*  ALT 2,320*  ALKPHOS 91  BILITOT 3.8*   PT/INR Recent Labs    05/26/2020 1120 05/20/20 1243  LABPROT 37.8* 46.2*  INR 4.0* 5.2*   Hepatitis Panel Recent Labs    05/18/2020 1120  HEPBSAG NON REACTIVE  HCVAB NON REACTIVE  HEPAIGM NON REACTIVE  HEPBIGM NON REACTIVE   Studies/Results: CT ABDOMEN PELVIS WO CONTRAST  Result Date: 05/27/2020 CLINICAL DATA:  Acute abdominal pain, nonlocalized, elevated LFTs EXAM: CT ABDOMEN AND PELVIS WITHOUT CONTRAST TECHNIQUE: Multidetector CT imaging of the abdomen and pelvis was performed following the standard protocol without IV contrast. COMPARISON:  04/15/2020 FINDINGS: Lower chest: Minimal basilar atelectasis versus scarring. Heart is enlarged. Pacer wires in the right side of the heart. Remote median sternotomy. No pericardial or pleural effusion. Degenerative changes of the lower thoracic spine. Hepatobiliary: Left hepatic hypodense cyst measuring 1.8 cm is unchanged. No intrahepatic biliary dilatation or new focal hepatic abnormality within the limits of noncontrast imaging. No biliary dilatation. Gallbladder unremarkable. Common bile duct nondilated. Pancreas: Unremarkable. No pancreatic ductal dilatation or surrounding inflammatory changes. Spleen: Normal in size without focal abnormality. Adrenals/Urinary Tract: Normal adrenal  glands. No acute renal obstruction or hydronephrosis. No focal renal contour abnormality. Ureters are symmetric and decompressed. No ureteral obstruction or ureteral calculus. Bladder unremarkable. Stomach/Bowel: Negative for bowel obstruction, significant dilatation, ileus, or free air. Appendix not visualized. No acute inflammatory process in the right lower quadrant. No free fluid, fluid collection, hemorrhage, hematoma, abscess or ascites. Vascular/Lymphatic: Extensive atherosclerosis of the  aorta. Negative for aneurysm. No retroperitoneal hemorrhage or hematoma. No evidence of rupture. Evaluation the vasculature is limited without IV contrast. No bulky adenopathy. Reproductive: Uterus and adnexa within normal limits for age. Other: Intact abdominal wall.  Negative for hernia. Musculoskeletal: Artifact from the bilateral hip replacements. Degenerative changes of the spine. IMPRESSION: No acute intra-abdominal or pelvic finding by noncontrast CT. Cardiomegaly Stable left hepatic 1.8 cm cyst Aortic Atherosclerosis (ICD10-I70.0). Electronically Signed   By: Jerilynn Mages.  Shick M.D.   On: 05/23/2020 13:57   DG Abd 1 View  Result Date: 05/15/2020 CLINICAL DATA:  Nasogastric placement EXAM: ABDOMEN - 1 VIEW COMPARISON:  CT same day FINDINGS: Orogastric or nasogastric tube enters the abdomen with its tip in the distal body or antrum of the stomach. IMPRESSION: Orogastric or nasogastric tube tip in the distal body or antrum of the stomach. Electronically Signed   By: Nelson Chimes M.D.   On: 05/14/2020 22:32   DG Chest Port 1 View  Result Date: 05/20/2020 CLINICAL DATA:  Encephalopathy. EXAM: PORTABLE CHEST 1 VIEW COMPARISON:  Chest x-ray 06/03/2020, CT chest 04/15/2020 FINDINGS: Left chest wall cardiac defibrillator in similar position. Sternotomy wires and cardiac surgical changes are again noted to overlie the mediastinum. Enteric tube courses below the hemidiaphragm with side port overlying the expected region of the gastric lumen and collimated off view. The heart size and mediastinal contours are unchanged. Lingular linear atelectasis. No focal consolidation. No pulmonary edema. No pleural effusion. No pneumothorax. No acute osseous abnormality. IMPRESSION: No active disease. Electronically Signed   By: Iven Finn M.D.   On: 05/20/2020 05:29   DG Chest Portable 1 View  Result Date: 06/03/2020 CLINICAL DATA:  Per nurse: Pt came in as Code Stroke. Family LKN 1730 yesterday. She has progressively  become weaker since that time. EMS reports Lt sided facial droop AND Right sided weakness upon their arrival. EXAM: PORTABLE CHEST 1 VIEW COMPARISON:  04/15/2020 FINDINGS: Stable changes from prior cardiac surgery. Cardiac silhouette is mildly enlarged. No mediastinal or hilar masses. Left anterior chest wall AICD is stable. Clear lungs.  No pleural effusion or pneumothorax. Skeletal structures are grossly intact. IMPRESSION: No acute cardiopulmonary disease. Electronically Signed   By: Lajean Manes M.D.   On: 05/20/2020 16:03   US LIVER DOPPLER  Result Date: 05/22/2020 CLINICAL DATA:  Liver failure EXAM: DUPLEX ULTRASOUND OF LIVER TECHNIQUE: Color and duplex Doppler ultrasound was performed to evaluate the hepatic in-flow and out-flow vessels. COMPARISON:  06/02/2020 CT without contrast FINDINGS: Liver: Increased hepatic echogenicity subcapsular left hepatic lobe anechoic cyst measures 1.6 cm. No other significant abnormality or intrahepatic biliary dilatation. Main Portal Vein size: 0.9 cm Portal Vein Velocities Main Prox:  13 cm/sec Main Mid: 16 cm/sec Main Dist:  17 cm/sec Right: 9 cm/sec Left: 18 cm/sec Hepatic Vein Velocities Right:  30 cm/sec Middle:  68 cm/sec Left:  61 cm/sec IVC: Present and patent with normal respiratory phasicity. Hepatic Artery Velocity:  41 cm/sec Splenic Vein Velocity:  17 cm/sec Spleen: 3.5 cm x 6.8 cm x 7.1 cm with a total volume of 86 cm^3 (411 cm^3 is upper limit  normal) Portal Vein Occlusion/Thrombus: No Splenic Vein Occlusion/Thrombus: No Ascites: None Varices: None Patent portal, hepatic and splenic veins with normal directional flow. No portal vein occlusion or thrombus. No free fluid or ascites. IMPRESSION: Patent hepatic venous system.  No veno-occlusive process. Electronically Signed   By: Jerilynn Mages.  Shick M.D.   On: 05/09/2020 18:04   CT HEAD CODE STROKE WO CONTRAST  Result Date: 05/31/2020 CLINICAL DATA:  Code stroke. 80 year old female with altered mental status,  facial droop. Last known well 1730 hours yesterday. EXAM: CT HEAD WITHOUT CONTRAST TECHNIQUE: Contiguous axial images were obtained from the base of the skull through the vertex without intravenous contrast. COMPARISON:  Head CT 04/15/2020.  Brain MRI 04/16/2020. FINDINGS: Brain: Small chronic area of right parietal lobe encephalomalacia is stable. Stable much smaller area of cortical encephalomalacia at the inferior left occipital pole. Patchy bilateral white matter hypodensity is stable. No midline shift, ventriculomegaly, mass effect, evidence of mass lesion, intracranial hemorrhage or evidence of cortically based acute infarction. Partially empty sella again noted. Stable dystrophic calcifications in the basal ganglia. Vascular: Calcified atherosclerosis at the skull base. No suspicious intracranial vascular hyperdensity. Skull: No acute osseous abnormality identified. Sinuses/Orbits: Visualized paranasal sinuses and mastoids are stable and well pneumatized. Other: Visualized orbits and scalp soft tissues are within normal limits. ASPECTS [facility] Stroke Program Early CT Score) Total score (0-10 with 10 being normal): 10 (chronic right parietal and left occipital cortical encephalomalacia). IMPRESSION: 1. Stable brain. No acute cortically based infarct or acute intracranial hemorrhage identified. ASPECTS 10. 2. Chronic right parietal and left occipital cortical encephalomalacia. 3. These results were communicated to Dr. Cheral Marker at 9:13 am on 05/14/2020 by text page via the [facility] messaging system. Electronically Signed   By: Genevie Ann M.D.   On: 05/30/2020 09:13   [place] EKG SITE RITE  Result Date: 05/20/2020 If Site Rite image not attached, placement could not be confirmed due to current cardiac rhythm.  Medications: I have reviewed the patient's current medications.  Assessment/Plan: 1) Acute liver injury with hepatic encephalopathy and coagulopathy of unknown etiology; the hepatitis serologies are negative;  patient was taking Tylenol for chronic arthralgias but as per the chart review she has not had any Tylenol in the last 3 weeks.  Agree with NAC and IV PPIs and Vitamin K supplementation. 2) Acute metabolic encephalopathy  Due to acute liver injury, AKI. 3) AKI on CKD 4) Lactic acidosis. 5) Acute hypoxic respiratory failure. 6) Acute thrombocytopenia/coagulopathy due to liver failure. 7) Bilateral carotid artery stenosis. 8) History of stroke.   . LOS: 1 day   Juanita Craver 05/20/2020, 4:00 PM

## 2020-05-20 NOTE — Progress Notes (Signed)
Initial Nutrition Assessment  DOCUMENTATION CODES:   Not applicable  INTERVENTION:   Initiate tube feeding via NG tube: Osmolite 1.5 at 20 ml/h, increase by 10 ml every 4 hours to goal rate of 50 ml/h (1200 ml per day)  Provides 1800 kcal, 75 gm protein, 914 ml free water daily  NUTRITION DIAGNOSIS:   Inadequate oral intake related to lethargy/confusion as evidenced by NPO status.  GOAL:   Patient will meet greater than or equal to 90% of their needs  MONITOR:   TF tolerance,Diet advancement,Labs,Skin  REASON FOR ASSESSMENT:   Consult Enteral/tube feeding initiation and management  ASSESSMENT:   80 yo female admitted with acute liver failure with encephalopathy. PMH includes CAD, cardiomyopathy, ICD, carotid stenosis, HLD, CKD stage 3, asthmatic bronchitis, HTN.   Discussed patient in ICU rounds and with RN today. Patient is NPO, not alert enough to take PO's. NG tube in place with tip in the distal body or antrum of the stomach per abdominal film on 2/13. Received MD Consult for TF initiation and management.  Labs reviewed.  CBG: 167  Medications reviewed and include Lactulose.  Weight history reviewed. Patient has lost 5% of usual weight within the past month, which is severe.    Unable to obtain nutrition hx or completed nutrition focused physical at this time as patient was having PICC placed.    Diet Order:   Diet Order            Diet NPO time specified Except for: Sips with Meds  Diet effective now                 EDUCATION NEEDS:   Not appropriate for education at this time  Skin:  Skin Assessment: Reviewed RN Assessment  Last BM:  no BM documented  Height:   Ht Readings from Last 1 Encounters:  05/18/2020 5' (1.524 m)    Weight:   Wt Readings from Last 1 Encounters:  05/20/20 62.9 kg    Ideal Body Weight:  45.5 kg  BMI:  Body mass index is 27.08 kg/m.  Estimated Nutritional Needs:   Kcal:  1700-1900  Protein:  75-90  gm  Fluid:  1.7 L    Lucas Mallow, RD, LDN, CNSC Please refer to Amion for contact information.

## 2020-05-20 NOTE — Progress Notes (Signed)
eLink Physician-Brief Progress Note Patient Name: Lynn Carey DOB: [birth year] MRN: [id number]   Date of Service  05/20/2020  HPI/Events of Note  Family request to restart home eye medication Xalatan and Cosopt. However, Epic blocks Cosopt at a dose of 1 gtt to both eyes BID or daily d/t reduced creatinine clearance.   eICU Interventions  Plan: 1. Will restart home Xalatan.     Intervention Category Major Interventions: Other:  Lysle Dingwall 05/20/2020, 8:23 PM

## 2020-05-20 NOTE — Plan of Care (Signed)
  Problem: Education: Goal: Knowledge of General Education information will improve Description: Including pain rating scale, medication(s)/side effects and non-pharmacologic comfort measures Outcome: Progressing   Problem: Health Behavior/Discharge Planning: Goal: Ability to manage health-related needs will improve Outcome: Progressing   Problem: Clinical Measurements: Goal: Ability to maintain clinical measurements within normal limits will improve Outcome: Progressing Goal: Will remain free from infection Outcome: Progressing Goal: Diagnostic test results will improve Outcome: Progressing Goal: Respiratory complications will improve Outcome: Progressing Goal: Cardiovascular complication will be avoided Outcome: Progressing   Problem: Activity: Goal: Risk for activity intolerance will decrease Outcome: Progressing   Problem: Nutrition: Goal: Adequate nutrition will be maintained Outcome: Progressing   Problem: Coping: Goal: Level of anxiety will decrease Outcome: Progressing   Problem: Elimination: Goal: Will not experience complications related to bowel motility Outcome: Progressing Goal: Will not experience complications related to urinary retention Outcome: Progressing   Problem: Pain Managment: Goal: General experience of comfort will improve Outcome: Progressing   Problem: Safety: Goal: Ability to remain free from injury will improve Outcome: Progressing   Problem: Skin Integrity: Goal: Risk for impaired skin integrity will decrease Outcome: Progressing   Problem: Education: Goal: Knowledge of secondary prevention will improve Outcome: Progressing Goal: Knowledge of patient specific risk factors addressed and post discharge goals established will improve Outcome: Progressing   Problem: Self-Care: Goal: Ability to communicate needs accurately will improve Outcome: Progressing

## 2020-05-21 DIAGNOSIS — N179 Acute kidney failure, unspecified: Secondary | ICD-10-CM | POA: Diagnosis not present

## 2020-05-21 DIAGNOSIS — G9341 Metabolic encephalopathy: Secondary | ICD-10-CM | POA: Diagnosis not present

## 2020-05-21 DIAGNOSIS — K7200 Acute and subacute hepatic failure without coma: Secondary | ICD-10-CM | POA: Diagnosis not present

## 2020-05-21 LAB — COMPREHENSIVE METABOLIC PANEL
ALT: 2300 U/L — ABNORMAL HIGH (ref 0–44)
ALT: 2023 U/L — ABNORMAL HIGH (ref 0–44)
AST: 1228 U/L — ABNORMAL HIGH (ref 15–41)
AST: 1215 U/L — ABNORMAL HIGH (ref 15–41)
Albumin: 3.6 g/dL (ref 3.5–5.0)
Albumin: 3.1 g/dL — ABNORMAL LOW (ref 3.5–5.0)
Alkaline Phosphatase: 174 U/L — ABNORMAL HIGH (ref 38–126)
Alkaline Phosphatase: 176 U/L — ABNORMAL HIGH (ref 38–126)
Anion gap: 16 — ABNORMAL HIGH (ref 5–15)
Anion gap: 16 — ABNORMAL HIGH (ref 5–15)
BUN: 73 mg/dL — ABNORMAL HIGH (ref 8–23)
BUN: 77 mg/dL — ABNORMAL HIGH (ref 8–23)
CO2: 17 mmol/L — ABNORMAL LOW (ref 22–32)
CO2: 19 mmol/L — ABNORMAL LOW (ref 22–32)
Calcium: 9.9 mg/dL (ref 8.9–10.3)
Calcium: 9.4 mg/dL (ref 8.9–10.3)
Chloride: 110 mmol/L (ref 98–111)
Chloride: 109 mmol/L (ref 98–111)
Creatinine, Ser: 2.07 mg/dL — ABNORMAL HIGH (ref 0.44–1.00)
Creatinine, Ser: 1.98 mg/dL — ABNORMAL HIGH (ref 0.44–1.00)
GFR, Estimated: 24 mL/min — ABNORMAL LOW (ref >60)
GFR, Estimated: 25 mL/min — ABNORMAL LOW (ref >60)
Glucose, Bld: 249 mg/dL — ABNORMAL HIGH (ref 70–99)
Glucose, Bld: 452 mg/dL — ABNORMAL HIGH (ref 70–99)
Potassium: 4.3 mmol/L (ref 3.5–5.1)
Potassium: 3.4 mmol/L — ABNORMAL LOW (ref 3.5–5.1)
Sodium: 143 mmol/L (ref 135–145)
Sodium: 144 mmol/L (ref 135–145)
Total Bilirubin: 5.9 mg/dL — ABNORMAL HIGH (ref 0.3–1.2)
Total Bilirubin: 5.3 mg/dL — ABNORMAL HIGH (ref 0.3–1.2)
Total Protein: 6.9 g/dL (ref 6.5–8.1)
Total Protein: 5.9 g/dL — ABNORMAL LOW (ref 6.5–8.1)

## 2020-05-21 LAB — BPAM FFP
Blood Product Expiration Date: 202202192359
Blood Product Expiration Date: 202202192359
ISSUE DATE / TIME: 202202141527
ISSUE DATE / TIME: 202202141527
Unit Type and Rh: 7300
Unit Type and Rh: 7300

## 2020-05-21 LAB — CBC
HCT: 29.6 % — ABNORMAL LOW (ref 36.0–46.0)
Hemoglobin: 9 g/dL — ABNORMAL LOW (ref 12.0–15.0)
MCH: 28.8 pg (ref 26.0–34.0)
MCHC: 30.4 g/dL (ref 30.0–36.0)
MCV: 94.9 fL (ref 80.0–100.0)
Platelets: 30 10*3/uL — ABNORMAL LOW (ref 150–400)
RBC: 3.12 MIL/uL — ABNORMAL LOW (ref 3.87–5.11)
RDW: 17.1 % — ABNORMAL HIGH (ref 11.5–15.5)
WBC: 17.9 10*3/uL — ABNORMAL HIGH (ref 4.0–10.5)
nRBC: 28.2 % — ABNORMAL HIGH (ref 0.0–0.2)

## 2020-05-21 LAB — URINE CULTURE: Culture: NO GROWTH

## 2020-05-21 LAB — COOXEMETRY PANEL
Carboxyhemoglobin: 1 % (ref 0.5–1.5)
Carboxyhemoglobin: 1.4 % (ref 0.5–1.5)
Carboxyhemoglobin: 1.3 % (ref 0.5–1.5)
Methemoglobin: 1.2 % (ref 0.0–1.5)
Methemoglobin: 1.2 % (ref 0.0–1.5)
Methemoglobin: 1.2 % (ref 0.0–1.5)
O2 Saturation: 37.6 %
O2 Saturation: 59.6 %
O2 Saturation: 52.1 %
Total hemoglobin: 8.7 g/dL — ABNORMAL LOW (ref 12.0–16.0)
Total hemoglobin: 7.9 g/dL — ABNORMAL LOW (ref 12.0–16.0)
Total hemoglobin: 8.2 g/dL — ABNORMAL LOW (ref 12.0–16.0)

## 2020-05-21 LAB — PHOSPHORUS: Phosphorus: 4.1 mg/dL (ref 2.5–4.6)

## 2020-05-21 LAB — ECHOCARDIOGRAM COMPLETE
Area-P 1/2: 6.17 cm2
Calc EF: 18.6 %
Height: 60 in
MV M vel: 5.31 m/s
MV Peak grad: 112.8 mmHg
Radius: 0.5 cm
S' Lateral: 5 cm
Single Plane A2C EF: 20.6 %
Single Plane A4C EF: 18 %
Weight: 2218.71 oz

## 2020-05-21 LAB — LACTIC ACID, PLASMA
Lactic Acid, Venous: 5.7 mmol/L (ref 0.5–1.9)
Lactic Acid, Venous: 4.2 mmol/L (ref 0.5–1.9)
Lactic Acid, Venous: 3.7 mmol/L (ref 0.5–1.9)

## 2020-05-21 LAB — PREPARE FRESH FROZEN PLASMA
Unit division: 0
Unit division: 0

## 2020-05-21 LAB — IGG: IgG (Immunoglobin G), Serum: 813 mg/dL (ref 586–1602)

## 2020-05-21 LAB — MITOCHONDRIAL ANTIBODIES: Mitochondrial M2 Ab, IgG: <20 Units (ref 0.0–20.0)

## 2020-05-21 LAB — GLUCOSE, CAPILLARY
Glucose-Capillary: 237 mg/dL — ABNORMAL HIGH (ref 70–99)
Glucose-Capillary: 270 mg/dL — ABNORMAL HIGH (ref 70–99)
Glucose-Capillary: 266 mg/dL — ABNORMAL HIGH (ref 70–99)
Glucose-Capillary: 298 mg/dL — ABNORMAL HIGH (ref 70–99)
Glucose-Capillary: 335 mg/dL — ABNORMAL HIGH (ref 70–99)
Glucose-Capillary: 368 mg/dL — ABNORMAL HIGH (ref 70–99)

## 2020-05-21 LAB — HSV(HERPES SIMPLEX VRS) I + II AB-IGM: HSVI/II Comb IgM: <0.91 Ratio (ref 0.00–0.90)

## 2020-05-21 LAB — PROTIME-INR
INR: 2.6 — ABNORMAL HIGH (ref 0.8–1.2)
INR: 2.9 — ABNORMAL HIGH (ref 0.8–1.2)
Prothrombin Time: 26.6 s — ABNORMAL HIGH (ref 11.4–15.2)
Prothrombin Time: 29 s — ABNORMAL HIGH (ref 11.4–15.2)

## 2020-05-21 LAB — HCV RNA QUANT: HCV Quantitative: NOT DETECTED IU/mL (ref >50)

## 2020-05-21 LAB — EPSTEIN-BARR VIRUS VCA, IGM: EBV VCA IgM: <36 U/mL (ref 0.0–35.9)

## 2020-05-21 LAB — FIBRINOGEN
Fibrinogen: 122 mg/dL — ABNORMAL LOW (ref 210–475)
Fibrinogen: 124 mg/dL — ABNORMAL LOW (ref 210–475)

## 2020-05-21 LAB — ANTI-SMOOTH MUSCLE ANTIBODY, IGG: F-Actin IgG: 13 Units (ref 0–19)

## 2020-05-21 LAB — LEGIONELLA PNEUMOPHILA SEROGP 1 UR AG: L. pneumophila Serogp 1 Ur Ag: NEGATIVE

## 2020-05-21 LAB — MAGNESIUM
Magnesium: 2.8 mg/dL — ABNORMAL HIGH (ref 1.7–2.4)
Magnesium: 2.6 mg/dL — ABNORMAL HIGH (ref 1.7–2.4)

## 2020-05-21 LAB — MPO/PR-3 (ANCA) ANTIBODIES
ANCA Proteinase 3: <3.5 U/mL (ref 0.0–3.5)
Myeloperoxidase Abs: <9 U/mL (ref 0.0–9.0)

## 2020-05-21 LAB — GLOMERULAR BASEMENT MEMBRANE ANTIBODIES: GBM Ab: 3 units (ref 0–20)

## 2020-05-21 LAB — ANA: Anti Nuclear Antibody (ANA): NEGATIVE

## 2020-05-21 MED ORDER — SODIUM CHLORIDE 0.9% IV SOLUTION: Freq: Once | INTRAVENOUS | Status: AC

## 2020-05-21 MED ORDER — ACETYLCYSTEINE LOAD VIA INFUSION
150.0000 mg/kg | Freq: Once | INTRAVENOUS | Status: AC
  Administered 2020-05-21: 10200 mg via INTRAVENOUS
  Filled 2020-05-21 (×2): qty 255

## 2020-05-21 MED ORDER — FUROSEMIDE 10 MG/ML IJ SOLN: 40.0000 mg | Freq: Once | INTRAMUSCULAR | Status: DC

## 2020-05-21 MED ORDER — PANTOPRAZOLE SODIUM 40 MG PO PACK
40.0000 mg | PACK | Freq: Every day | ORAL | Status: DC
  Administered 2020-05-21 – 2020-05-23 (×3): 40 mg
  Filled 2020-05-21 (×4): qty 20

## 2020-05-21 MED ORDER — DOBUTAMINE IN D5W 4-5 MG/ML-% IV SOLN
5.0000 ug/kg/min | INTRAVENOUS | Status: DC
  Administered 2020-05-21: 5 ug/kg/min via INTRAVENOUS
  Filled 2020-05-21: qty 250

## 2020-05-21 MED ORDER — FUROSEMIDE 10 MG/ML IJ SOLN
40.0000 mg | Freq: Three times a day (TID) | INTRAMUSCULAR | Status: DC
  Administered 2020-05-22: 40 mg via INTRAVENOUS
  Filled 2020-05-21: qty 4

## 2020-05-21 MED ORDER — FUROSEMIDE 10 MG/ML IJ SOLN
40.0000 mg | Freq: Three times a day (TID) | INTRAMUSCULAR | Status: DC
  Administered 2020-05-21: 40 mg via INTRAVENOUS
  Filled 2020-05-21: qty 4

## 2020-05-21 MED ORDER — MILRINONE LACTATE IN DEXTROSE 20-5 MG/100ML-% IV SOLN
0.2500 ug/kg/min | INTRAVENOUS | Status: DC
  Administered 2020-05-21 – 2020-05-23 (×3): 0.25 ug/kg/min via INTRAVENOUS
  Filled 2020-05-21 (×3): qty 100

## 2020-05-21 MED ORDER — FUROSEMIDE 10 MG/ML IJ SOLN: 80.0000 mg | Freq: Three times a day (TID) | INTRAMUSCULAR | Status: DC

## 2020-05-21 MED ORDER — INSULIN ASPART 100 UNIT/ML ~~LOC~~ SOLN
0.0000 [IU] | SUBCUTANEOUS | Status: DC
  Administered 2020-05-21 (×2): 3 [IU] via SUBCUTANEOUS
  Administered 2020-05-21: 5 [IU] via SUBCUTANEOUS
  Administered 2020-05-21: 4 [IU] via SUBCUTANEOUS
  Administered 2020-05-21: 3 [IU] via SUBCUTANEOUS
  Administered 2020-05-22: 6 [IU] via SUBCUTANEOUS

## 2020-05-21 MED ORDER — DEXTROSE 5 % IV SOLN
6.2500 mg/kg/h | INTRAVENOUS | Status: DC
  Administered 2020-05-22: 6.25 mg/kg/h via INTRAVENOUS
  Filled 2020-05-21: qty 120

## 2020-05-21 MED ORDER — DEXTROSE 5 % IV SOLN
12.5000 mg/kg/h | INTRAVENOUS | Status: AC
  Administered 2020-05-21: 12.5 mg/kg/h via INTRAVENOUS
  Filled 2020-05-21: qty 120

## 2020-05-21 MED ORDER — SILDENAFIL CITRATE 20 MG PO TABS
20.0000 mg | ORAL_TABLET | Freq: Three times a day (TID) | ORAL | Status: DC
  Administered 2020-05-21: 20 mg
  Filled 2020-05-21 (×2): qty 1

## 2020-05-21 NOTE — Progress Notes (Signed)
eLink Physician-Brief Progress Note Patient Name: KAYDAN MANCIAS DOB: Sep 06, 1940 MRN: [id number]   Date of Service  05/21/2020  HPI/Events of Note  Patient on Lactulose and having frequent loose, watery stools. Nursing request for Flexiseal.   eICU Interventions  Will place Flexieal.      Intervention Category Major Interventions: Other: Minor Interventions: Electrolytes abnormality - evaluation and management  Heith Haigler Eugene 05/21/2020, 6:23 AM

## 2020-05-21 NOTE — Progress Notes (Signed)
Pharmacy Consult for Milrinone (Primacor) Initiation  Indication:   Acute Decompensated Heart Failure with volume overload and low cardiac output  Allergies  Allergen Reactions  . Lisinopril Cough    Temp:  [97.1 F (36.2 C)-97.9 F (36.6 C)] 97.8 F (36.6 C) (02/15 1539) Pulse Rate:  [97-109] 107 (02/15 1500) Cardiac Rhythm: Sinus tachycardia (02/15 1600) Resp:  [9-46] 25 (02/15 1500) BP: (111-164)/(75-108) 159/86 (02/15 1500) SpO2:  [92 %-100 %] 99 % (02/15 1500) Weight:  [68 kg (149 lb 14.6 oz)] 68 kg (149 lb 14.6 oz) (02/15 0500)  LABS    Component Value Date/Time   NA 143 05/21/2020 0315   NA 140 04/25/2020 1110   K 4.3 05/21/2020 0315   CL 110 05/21/2020 0315   CO2 17 (L) 05/21/2020 0315   GLUCOSE 249 (H) 05/21/2020 0315   BUN 73 (H) 05/21/2020 0315   BUN 26 04/25/2020 1110   CREATININE 2.07 (H) 05/21/2020 0315   CALCIUM 9.9 05/21/2020 0315   GFRNONAA 24 (L) 05/21/2020 0315   GFRAA 48 (L) 04/25/2020 1110   Last magnesium:  Lab Results  Component Value Date   MG 2.8 (H) 05/21/2020   Estimated Creatinine Clearance: 19 mL/min (A) (by C-G formula based on SCr of 2.07 mg/dL (H)). Serum creatinine: 2.07 mg/dL (H) 05/21/20 0315 Estimated creatinine clearance: 19 mL/min (A) estimated creatinine clearance is 19 mL/min (A) (by C-G formula based on SCr of 2.07 mg/dL (H)).   Intake/Output Summary (Last 24 hours) at 05/21/2020 1904 Last data filed at 05/21/2020 1700 Gross per 24 hour  Intake 3999.27 ml  Output 590 ml  Net 3409.27 ml    Filed Weights   05/08/2020 2000 05/20/20 0500 05/21/20 0500  Weight: 62.9 kg (138 lb 10.7 oz) 62.9 kg (138 lb 10.7 oz) 68 kg (149 lb 14.6 oz)    Assessment:   80 y.o. female admitted 05/13/2020 with acute decompensated congestive heart failure to be initiated on milrinone.  Patient with EF 20-25%. For initial dosing use TBW and if wt decreases during diureses by > 10% then decrease doing wt in pump to match. Patient's output is only  243m in last 24 hours despite IV furosemide '40mg'$  every 8 hours. Potassium, Magnesium, SCr, and vital signs are stable. Call physician for replacement if potassium is < 4 or magnesium is < 2 and replacement has not already been ordered.  Milrinone can cause arrhythmias.  Monitor patient for ECG changes.  Plan is to initiate milrinone for inotropic support.  Plan:  1. Initiate milrinone based on renal function: (Consider starting dose of 0.125 - 0.25 for patients with SBP <1152mg) Select One Calculated CrCl Dose  '[]'$  > 50 ml/min 0.375 mcg/kg/min  '[x]'$  20-49 ml/min 0.250 mcg/kg/min  '[]'$  < 20 ml/min 0.125 mcg/kg/min   2. Nursing to monitor vital signs per milrinone protocol and physician parameters. 3. Pharmacy to follow peripherally, please reconsult if needed or there is further questions. 4.  Please contact MD for further dosing instructions.   HaArturo MortonPharmD, BCPS Please check AMION for all MCEldonontact numbers Clinical Pharmacist 05/21/2020 7:09 PM

## 2020-05-21 NOTE — Progress Notes (Signed)
[facility]  Updated daughter Willette Cluster bedside.  Sister Karena Addison coming from [place] goals of care conversation then after seen by Dr. Einar Gip today  Roselie Awkward, MD [place] [facility] Pager: [phone] Cell: [phone] If no response, call [phone]

## 2020-05-21 NOTE — Progress Notes (Signed)
eLink Physician-Brief Progress Note Patient Name: Lynn Carey DOB: 1941/02/14 MRN: [id number]   Date of Service  05/21/2020  HPI/Events of Note  INR = 2.6 and Fibrinogen = 122 post 2 units FFP/   eICU Interventions  Plan: 1. Transfuse another 2 units FFP now.  2. Repeat PT/INR and Fibrinogen at 10 AM.     Intervention Category Major Interventions: Other:  Isidra Mings Cornelia Copa 05/21/2020, 5:03 AM
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
## 2020-05-21 NOTE — Progress Notes (Signed)
I spoke with Dr. Einar Gip earlier today regarding the patient's current medical condition. We discussed her on going cardiogenic shock and need for pressers. He stated that he will come by to see the patient and make recommendations.   Lawerance Cruel, D.O.  Internal Medicine Resident, PGY-2 [facility]  Pager: [phone] 4:17 PM, 05/21/2020

## 2020-05-21 NOTE — Progress Notes (Signed)
NAME:  Lynn Carey, MRN:  [id number], DOB:  1940-08-30, LOS: 2 ADMISSION DATE:  05/23/2020, CONSULTATION DATE:  06/01/2020  REFERRING MD:  EDP and triad Dr Feliberto Gottron, CHIEF COMPLAINT:  Acute Liver failure with encephalopathy   Brief History:  Lynn Carey is a 80 yo female with a pPMH of CAD s/p PCI and CABG (1997), HFrEF from ischemic cardiomyopathy (EF 20%) s/p ICD placement (2021), T2DM, CKD (Stage 3), HTN,  Bilateral Carotid obstructions (70%), who presented to [facility] with right sided weakness and fluctuating mental status and was admitted for acute encephalopathy thought to be secondary to acute liver failure   On arrival to the ED, CT scan showed chronic right and left occipital cortical encephalomalacia. She was noted to have abdominal pain that has been presents for a few weeks with a history of elevated LFTs. This was consistent with her presenting labs showing elevated LFTs, bilirubin and INR.   Past Medical History:    has a past medical history of Arthritis, Asthma, Carotid artery occlusion, Chronic systolic heart failure ([place]) ([id number]), Complication of anesthesia, Coronary artery disease, Diabetes mellitus without complication ([place]), GERD (gastroesophageal reflux disease), Hypertension, ICD Single chamber Medtronic VISIA MRI VR [id number] 04/21/2019 (04/21/2019), and PONV (postoperative nausea and vomiting).   reports that she quit smoking about 36 years ago. Her smoking use included cigarettes. She has a 1.00 pack-year smoking history. She has never used smokeless tobacco.  Significant Hospital Events:  05/13/2020 - admit   Consults:  GI Neuro  Procedures:    Significant Diagnostic Tests:  2/13 CT HEAD CODE STROKE WO CONTRAST 1. Stable brain. No acute cortically based infarct or acute intracranial hemorrhage identified. ASPECTS 10.  2. Chronic right parietal and left occipital cortical encephalomalacia.  2/13 CT ABDOMEN PELVIS WO CONTRAST No acute intra-abdominal or  pelvic finding by noncontrast CT. Cardiomegaly Stable left hepatic 1.8 cm cyst Aortic Atherosclerosis.  2/13 CXR: No acute cardiopulmonary disease.  2/13 Abd XR: Orogastric or nasogastric tube tip in the distal body or antrum of the stomach.   2/13 US Liver: Patent hepatic venous system.  No veno-occlusive process.   2/14 CXR: No active disease  2/15 Echo: pending   Micro Data:  02/13 COVID negative 02/13 - MRSA negative. 02/14 BCx>> 02/14 UCx >>  Antimicrobials:  02/14 Vanc>> 02/14 Zosyn>>  Interim History / Subjective:   Overnight, patient remains hypothermic and had multiple stools. Flexiseal was placed. Patient also had 2 units of FFP transfused yesterday.    On evaluation today, resting in bed. She remains encephalopathic and sleepy. She will respond to verbal and physical stimuli. She remains on 2 L [place].   Objective   Blood pressure (!) 157/94, pulse 100, temperature 97.6 F (36.4 C), temperature source Oral, resp. rate (!) 21, height 5' (1.524 m), weight 68 kg, SpO2 95 %.        Intake/Output Summary (Last 24 hours) at 05/21/2020 0542 Last data filed at 05/21/2020 0500 Gross per 24 hour  Intake 2976.61 ml  Output 275 ml  Net 2701.61 ml   Filed Weights   05/13/2020 2000 05/20/20 0500 05/21/20 0500  Weight: 62.9 kg 62.9 kg 68 kg    Examination: General: ill appearing woman laying in bed in NAD, appears stated age, sleepy but arousable HENT: [place]/AT, eyes anicteric Lungs: CTAB, breathing comfortably on [place] Cardiovascular: RRR, no murmurs Abdomen: soft, NT Extremities: no peripheral edema, no cyanosis Derm: warm, dry Neuro: Speaking in one word phrases. Moves all extremities spontaneously.  LDA: NG (1d) Rectal Tube (1d) PICC (1d)  Resolved Hospital Problem list     Assessment & Plan:   Acute Liver Injury with hepatic encephalopathy and coagulopathy Acute anemia due to acute illness, liver failure Acute thrombocytopenia- likely due to liver  failure Unknown etiology. Patient is at risk for DILI due to APAP vs mirtazapine. Negative acute viral panel. Patient had labs concerning for DIC with thrombocytopenia, elevated INR and decreased fibrinogen. She also had a smear showing Schistocytes. She is now s/p 4 units of FFP yesterday. INR is 2.6 and fibrinogen of 122 today.  Hgb dropped from 11.6 to 9.0 in 24 hours. Plts of 30 today. Unable to r/o TMA such as HUS.  -serial CBCs - Will give FFP and Vitamin as needed.  -transfuse for Hb<7 and Plts< 10 or hemodynamically significant bleeding - ADAMTS13 pending - Received NAC, Vit K,  - Continue PPI - appreciate GI assitance.  Acute metabolic encephalopathy due to liver injury, AKI -con't lactulose, titrate to achieve 2-3 bowel movements daily.  -con't ICU monitoring for mental status -NGT in place; start TF  AKI on CKD (BL Cr 1.10-1.33), pre-renal azotemia (FeNa of 0.5) - concern for acute cardiorenal vs hepatorenal syndrome. Patient presents AKI with FeNa showing evidence of pre-renal azotemia. She was given fluids yesterday with some improvement of her kidney function. Cr 2.48>1.72>2.0. She is oliguric with only 275cc out in the last 24 hours via straight cath.  -con't to monitor -strict I/Os -renally dose meds, avoid nephrotoxic meds  Lactic acidosis - likey due to liver issues Lactic acid continues to remain elevated 3.2>3.0>5.7 in the setting of ALF. Uncertain if patient has underlying infection that is contributing to this elevated lactic acid.  - Continue to trend LA - Maintain MAPs > 60 - BCx/UCx pending  Acute hypoxic respiratory failure She remains on 2 liter [place].  -monitor for ability to protect airway; may need intubation if progressive encephalopathy    Leukocytosis with neutrophilia Hypothermia: Patient remains hypothermic with elevated leukocytosis. Treating empirically for sepsis with Vanc and cefepime. BCx and UCx pending.  -blood cultures, urine culture -empiric  cefepime and vanc; discussed with pharmacy  Chronic HFrEF, dilated LA, moderate MR, moderate PH, moderate TR. LVEF 20%. AICD in place. Patient is not overtly hypervolemic on exam.  -repeat echo pending -monitor on telemetry -goal euvolemia  Hyperglycemia CBG of 237 this morning and receiving tube feeds: -SSI sensitive PRN -goal BG 140-180 if requiring insulin  Previous strokes 70% bilateral Carotid stenosis Feb 2022 (Dr Einar Gip)  -MAP goal > 65 - abroutp BP reductions puts her at risk for stroke  Best practice (evaluated daily)  Diet: NPO, TF Pain/Anxiety/Delirium protocol (if indicated):  VAP protocol (if indicated):  DVT prophylaxis: scd GI prophylaxis: ppo Glucose control: ssi Mobility: bed rest Disposition: ICU  Goals of Care:   Multi-Disciplinary Goals of Care Discussion Family Updates: 2/14- update both daughters via phone   [place]  Lab 05/15/2020 0856 06/02/2020 1127 05/20/20 0256  PHART  --  7.375 7.385  PCO2ART  --  28.2* 27.4*  PO2ART  --  98 122*  HCO3  --  16.5* 16.5*  TCO2 18* 17* 17*  O2SAT  --  98.0 99.0    CBC Recent Labs  Lab 05/20/20 1244 05/20/20 2114 05/21/20 0315  HGB 8.6* 9.6* 9.0*  HCT 27.7* 29.9* 29.6*  WBC 11.8* 16.1* 17.9*  PLT 43* 40* 30*    COAGULATION Recent Labs  Lab 05/14/2020 1120 05/20/20 1243 05/20/20 2114 05/21/20 0315  INR 4.0* 5.2* 2.7* 2.6*    CARDIAC  No results for input(s): TROPONINI in the last 168 hours. No results for input(s): PROBNP in the last 168 hours.   CHEMISTRY Recent Labs  Lab 05/08/2020 0856 06/01/2020 1120 05/18/2020 1127 05/20/20 0256 05/20/20 1244 05/21/20 0315  NA 140 141 140 139 140 143  K 5.8* 5.4* 5.3* 4.7 3.5 4.3  CL 112* 110  --   --  113* 110  CO2  --  14*  --   --  16* 17*  GLUCOSE 141* 144*  --   --  149* 249*  BUN 65* 62*  --   --  59* 73*  CREATININE 2.40* 2.48*  --   --  1.72* 2.07*  CALCIUM  --  9.8  --   --  7.4* 9.9  MG  --   --   --   --  1.7  2.8*  PHOS  --   --   --   --  3.3 4.1   Estimated Creatinine Clearance: 19 mL/min (A) (by C-G formula based on SCr of 2.07 mg/dL (H)).   LIVER Recent Labs  Lab 05/21/2020 1120 05/20/20 1243 05/20/20 1244 05/20/20 2114 05/21/20 0315  AST 3,748*  RESULTS UNAVAILABLE DUE TO INTERFERING SUBSTANCE  --  1,355*  --  1,228*  ALT 3,802*  RESULTS UNAVAILABLE DUE TO INTERFERING SUBSTANCE  --  2,320*  --  2,300*  ALKPHOS 120  --  91  --  174*  BILITOT 3.1*  --  3.8*  --  5.9*  PROT 6.3*  --  4.7*  --  6.9  ALBUMIN 3.4*  --  2.4*  --  3.6  INR 4.0* 5.2*  --  2.7* 2.6*     INFECTIOUS Recent Labs  Lab 06/01/2020 1120 05/20/20 1244 05/20/20 1258 05/21/20 0315  LATICACIDVEN 3.2*  --  3.0* 5.7*  PROCALCITON  --  0.42  --   --      ENDOCRINE CBG (last 3)  Recent Labs    05/20/20 1928 05/20/20 2339 05/21/20 0315  GLUCAP 218* [place], D.O.  Internal Medicine Resident, PGY-2 [facility]  Pager: [phone] 9:00 AM, 05/21/2020

## 2020-05-21 NOTE — Progress Notes (Signed)
STROKE TEAM PROGRESS NOTE   INTERVAL HISTORY She presented with unresponsiveness and right-sided weakness which was found to have severe appearing encephalopathy, renal insufficiency, and hepatic failure.  CT scan of the head shows no acute abnormality but shows chronic right parietal and left occipital infarcts with encephalomalacia.  MRI scan was ordered but is on hold due to her worsening condition this morning.  Hypertensive and tachycardic this morning.  Her encephalopathy is likely due to  toxic metabolic etiology from ARF and hepatic failure.  Neurological exam: patient drowsy, PERRL, not following commands, moving all 4 extremities spontaneously.  Spoke with the ICU team this morning.  They feel that the patient is in cardiac shock with worsening renal function and thrombocytopenia.  This is most likely contributing to her encephalopathy and not an acute stroke.  Neurology will signoff now.      Vitals:   05/21/20 0900 05/21/20 1000 05/21/20 1100 05/21/20 1145  BP: (!) 155/96 (!) 163/95 (!) 160/92   Pulse: 100 (!) 102 (!) 102   Resp: (!) 28 (!) 24 (!) 26   Temp:    97.9 F (36.6 C)  TempSrc:    Axillary  SpO2: 100% 100% 99%   Weight:      Height:       CBC:  Recent Labs  Lab 05/22/2020 1120 05/10/2020 1127 05/20/20 1244 05/20/20 2114 05/21/20 0315  WBC 12.7*  --  11.8* 16.1* 17.9*  NEUTROABS 10.2*  --  9.1*  --   --   HGB 10.8*   < > 8.6* 9.6* 9.0*  HCT 35.0*   < > 27.7* 29.9* 29.6*  MCV 93.1  --  93.3 92.9 94.9  PLT 87*   < > 43* 40* 30*   < > = values in this interval not displayed.   Basic Metabolic Panel:  Recent Labs  Lab 05/20/20 1244 05/21/20 0315  NA 140 143  K 3.5 4.3  CL 113* 110  CO2 16* 17*  GLUCOSE 149* 249*  BUN 59* 73*  CREATININE 1.72* 2.07*  CALCIUM 7.4* 9.9  MG 1.7 2.8*  PHOS 3.3 4.1    Lipid Panel:  Recent Labs  Lab 05/16/2020 1120  CHOL 155  TRIG 97  HDL 34*  CHOLHDL 4.6  VLDL 19  LDLCALC 102*    HgbA1c: 5.2 (04/16/2020) Urine Drug  Screen: pending Alcohol Level No results for input(s): ETH in the last 168 hours.  IMAGING past 24 hours ECHOCARDIOGRAM COMPLETE  Result Date: 05/21/2020    ECHOCARDIOGRAM REPORT   IMPRESSIONS   1. Left ventricular ejection fraction, by estimation, is 20 to 25%. The left ventricle has severely decreased function. The left ventricle demonstrates global hypokinesis. The left ventricular internal cavity size was mildly dilated. Left ventricular diastolic parameters are consistent with Grade II diastolic dysfunction (pseudonormalization). Elevated left ventricular end-diastolic pressure. The E/e' is 16. There is the interventricular septum is flattened in diastole ('D' shaped left ventricle), consistent with right ventricular volume overload.   2. Right ventricular systolic function was not well visualized. The right ventricular size is mildly enlarged. There is severely elevated pulmonary artery systolic pressure. The estimated right ventricular systolic pressure is [id number] mmHg.   3. Left atrial size was moderately dilated.   4. The mitral valve is normal in structure. Moderate to severe mitral valve regurgitation.   5. Tricuspid valve regurgitation is severe.   6. The aortic valve is normal in structure. Aortic valve regurgitation is not visualized. No aortic stenosis is present.  7. The inferior vena cava is dilated in size with <50% respiratory variability, suggesting right atrial pressure of 15 mmHg.  05/12/2020 Echo 1. Left ventricle cavity is mildly dilated. Normal left ventricular wall thickness. Severe global hypokinesis, LVEF 20%. Spontaneous echo contrast seen. Cannot rule out thrombus on non-contrast study. Indeterminate diastolic filling pattern due to E/A fusion. Calculated EF 20%. 2. Left atrial cavity is severely dilated. 3. Moderate (Grade II) mitral regurgitation. 4. Moderate tricuspid regurgitation. Moderate pulmonary hypertension. Estimated pulmonary artery systolic pressure  54 mmHg. RVSP measures 54 mmHg. 5. Mild pulmonic regurgitation. 6. Compared to previous study on 10/03/2019, LVEF has further reduced from 25-30%. No other significant change noted.   May 11, 2020 CT Head Result date: 05/18/2020 IMPRESSION: 1. Stable brain. No acute cortically based infarct or acute intracranial hemorrhage identified. ASPECTS 10. 2. Chronic right parietal and left occipital cortical Encephalomalacia.  PHYSICAL EXAM Frail elderly African-American lady in apparent distress. Afebrile. Head is nontraumatic. Neck is supple without bruit.   Cardiac exam no murmur or gallop, tachycardic. Lungs are clear to auscultation. Distal pulses are well felt.  Neurological Exam : Patient is drowsy but can arouse easily.  Not cooperating with the exam.  Unable to follow simple commands.  She is not oriented.  There is easy distractibility and diminished attention and registration.  Speech nonfluent. Extraocular movements appear full range.  She blinks to threat bilaterally.  Face appears symmetric.  Motor system exam is limited due to lack of cooperation from patient but she is able to move all 4 extremities right > left but her cooperation is limited.  ASSESSMENT/PLAN Lynn Carey is a 80 y.o. female with history of asthma, congestive heart failure s/p ICD placement, coronary artery disease, diabetes mellitus type 2, hypertension, and history of carotid artery occlusion on home clopidogrel who [facility] via EMS as a stroke alert due to decreased responsiveness and right-sided weakness.   Encephalopathy  Fluctuating mental status likely toxic metabolic etiology from ARF and hepatic failure  CT head to evaluate for stroke:  no cortically based infarct or acute intracranial hemorrhage identified. ASPECTS 10    CTA head & neck: unable to obtain due to elevation in BUN and creatinine  MRA head/neck WO contrast: discontinue for now as patient is unstable   MRI  pending, discontinue for now as patient is unstable.  Carotid Artery duplex 05/08/2020: stenosis in right internal carotid artery (50-69%), Stenosis in the left internal carotid artery (>=70%).  Carotid Doppler 05/08/20: Stenosis in the right internal carotid artery (50-69%). Stenosis in the left internal carotid artery (>=70%).  recent echo 05/12/2020: Severe global hypokinesis, EF 20%, Grade II mitral regurgitation, Moderate tricuspid regurgitation  LDL 102  HgbA1c 5.6  VTE prophylaxis - hold, PTT >200, PLT 43 INR 5.2  Plavix '75mg'$  prior to admission, plan for DAPT  with aspirin '81mg'$  and Plavix '75mg'$  for 3 weeks and then continue Plavix alone if MRI negative for hemorrhage or new infarct and once appropriate with hepatic failure and anemia standpoint  Therapy recommendations:  pending  Disposition:  pending  Left ICA stenosis  Carotid Doppler (05/08/2020): Stenosis in the left internal carotid artery >70%  Will need further imaging when stable  Aspirin and plavix once stable   Hypertension  Home meds: metoprolol '100mg'$  daily   Currently on Dobutamine for concern of cardiogenic shock . Long-term BP goal normotensive  Hyperlipidemia  Home meds: crestor and zetia, not resumed in hospital  LDL 102, goal <  61  Holding statin due to hepatic failure  Continue statin at discharge  Diabetes type II Controlled  Home meds:  Junuvia '50mg'$  daily  HgbA1c 5.6, goal < 7.0  CBGs Recent Labs    05/21/20 0315 05/21/20 0749 05/21/20 1143  GLUCAP 237* 270* 266*      SSI  Other Stroke Risk Factors  Advanced Age >/= 37   Cigarette smoker (former) quit in1985  Overweight, Body mass index is 29.28 kg/m., BMI >/= 30 associated with increased stroke risk, recommend weight loss, diet and exercise as appropriate   Coronary artery disease  MI in 1997, 2009  Obstructive sleep apnea  Congestive heart failure diagnosed on 09/19/2018  Carotid artery occlusion   Other Active  Problems  Hospital day # [place] [facility] 05/21/2020 1200  Patient is overall general medical condition is significantly declined with patient now in shock along with multiorgan failure.  Neurological exam is compromised by her severe encephalopathy but appears to be nonfocal.  Agree with canceling MRI scan and stroke team will sign off and will call for questions.  Discussed with Dr. Lake Bells and ICU team.  Greater than 50% time during this 35-minute visit was spent in counseling and coordination of care and discussion with care team answering questions.  Lynn Contras, MD Medical Director [facility] Pager: [phone] 05/21/2020 11:46 AM   To contact Stroke Continuity provider, please refer to [email or web address] After hours, contact General Neurology

## 2020-05-21 NOTE — Progress Notes (Deleted)
eLink Physician-Brief Progress Note Patient Name: Lynn Carey DOB: Apr 24, 1940 MRN: [id number]   Date of Service  05/21/2020  HPI/Events of Note  Patient on [place] at 35 L/min with sats in low 90's while awake, however, sats drop into low 80's-high 70's which asleep. CVP = 23. Patient is already getting Lasix Q 8 hours. Creatinine = 1.90. Patient just got Lasix 40 mg IV at 10 PM.  eICU Interventions  Plan: 1. Trial of BiPAP.  2. Increase Lasix dose to 80 mg Q 8 hours.  4. Lasix 40 mg IV now (extra dose. Total dose now = 80 mg)     Intervention Category Major Interventions: Hypoxemia - evaluation and management  Khadeja Abt [place] 05/21/2020, 10:45 PM

## 2020-05-21 NOTE — Progress Notes (Signed)
Called the floor to get report and coordinate a time for patient to come down for MRI.  PER RN patient's MRI is on hold as the patient is to restless and moving to much for a MRI.

## 2020-05-22 ENCOUNTER — Inpatient Hospital Stay (HOSPITAL_COMMUNITY): Payer: Medicare HMO

## 2020-05-22 DIAGNOSIS — K7201 Acute and subacute hepatic failure with coma: Secondary | ICD-10-CM | POA: Diagnosis not present

## 2020-05-22 DIAGNOSIS — N179 Acute kidney failure, unspecified: Secondary | ICD-10-CM | POA: Diagnosis not present

## 2020-05-22 DIAGNOSIS — J9601 Acute respiratory failure with hypoxia: Secondary | ICD-10-CM | POA: Diagnosis not present

## 2020-05-22 DIAGNOSIS — G9341 Metabolic encephalopathy: Secondary | ICD-10-CM | POA: Diagnosis not present

## 2020-05-22 LAB — POCT I-STAT 7, (LYTES, BLD GAS, ICA,H+H)
Acid-base deficit: 5 mmol/L — ABNORMAL HIGH (ref 0.0–2.0)
Acid-base deficit: 2 mmol/L (ref 0.0–2.0)
Bicarbonate: 18.5 mmol/L — ABNORMAL LOW (ref 20.0–28.0)
Bicarbonate: 23.8 mmol/L (ref 20.0–28.0)
Calcium, Ion: 1.3 mmol/L (ref 1.15–1.40)
Calcium, Ion: 1.3 mmol/L (ref 1.15–1.40)
HCT: 27 % — ABNORMAL LOW (ref 36.0–46.0)
HCT: 25 % — ABNORMAL LOW (ref 36.0–46.0)
Hemoglobin: 9.2 g/dL — ABNORMAL LOW (ref 12.0–15.0)
Hemoglobin: 8.5 g/dL — ABNORMAL LOW (ref 12.0–15.0)
O2 Saturation: 95 %
O2 Saturation: 100 %
Patient temperature: 98.1
Patient temperature: 98.1
Potassium: 3.2 mmol/L — ABNORMAL LOW (ref 3.5–5.1)
Potassium: 3.1 mmol/L — ABNORMAL LOW (ref 3.5–5.1)
Sodium: 149 mmol/L — ABNORMAL HIGH (ref 135–145)
Sodium: 148 mmol/L — ABNORMAL HIGH (ref 135–145)
TCO2: 19 mmol/L — ABNORMAL LOW (ref 22–32)
TCO2: 25 mmol/L (ref 22–32)
pCO2 arterial: 28.3 mmHg — ABNORMAL LOW (ref 32.0–48.0)
pCO2 arterial: 44 mmHg (ref 32.0–48.0)
pH, Arterial: 7.421 (ref 7.350–7.450)
pH, Arterial: 7.341 — ABNORMAL LOW (ref 7.350–7.450)
pO2, Arterial: 70 mmHg — ABNORMAL LOW (ref 83.0–108.0)
pO2, Arterial: 356 mmHg — ABNORMAL HIGH (ref 83.0–108.0)

## 2020-05-22 LAB — PROTIME-INR
INR: 3.2 — ABNORMAL HIGH (ref 0.8–1.2)
Prothrombin Time: 31.8 seconds — ABNORMAL HIGH (ref 11.4–15.2)

## 2020-05-22 LAB — BRAIN NATRIURETIC PEPTIDE: B Natriuretic Peptide: >4500 pg/mL — ABNORMAL HIGH (ref 0.0–100.0)

## 2020-05-22 LAB — BPAM FFP
Blood Product Expiration Date: 202202192359
Blood Product Expiration Date: 202202192359
Blood Product Expiration Date: 202202202359
Blood Product Expiration Date: 202202202359
ISSUE DATE / TIME: 202202142327
ISSUE DATE / TIME: 202202150028
ISSUE DATE / TIME: 202202150550
ISSUE DATE / TIME: 202202150733
Unit Type and Rh: 7300
Unit Type and Rh: 7300
Unit Type and Rh: 7300
Unit Type and Rh: 7300

## 2020-05-22 LAB — CBC
HCT: 25.3 % — ABNORMAL LOW (ref 36.0–46.0)
Hemoglobin: 8.3 g/dL — ABNORMAL LOW (ref 12.0–15.0)
MCH: 30.2 pg (ref 26.0–34.0)
MCHC: 32.8 g/dL (ref 30.0–36.0)
MCV: 92 fL (ref 80.0–100.0)
Platelets: 30 10*3/uL — ABNORMAL LOW (ref 150–400)
RBC: 2.75 MIL/uL — ABNORMAL LOW (ref 3.87–5.11)
RDW: 17.2 % — ABNORMAL HIGH (ref 11.5–15.5)
WBC: 17.2 10*3/uL — ABNORMAL HIGH (ref 4.0–10.5)
nRBC: 25.2 % — ABNORMAL HIGH (ref 0.0–0.2)

## 2020-05-22 LAB — PREPARE FRESH FROZEN PLASMA
Unit division: 0
Unit division: 0

## 2020-05-22 LAB — GLUCOSE, CAPILLARY
Glucose-Capillary: 427 mg/dL — ABNORMAL HIGH (ref 70–99)
Glucose-Capillary: 352 mg/dL — ABNORMAL HIGH (ref 70–99)
Glucose-Capillary: 312 mg/dL — ABNORMAL HIGH (ref 70–99)
Glucose-Capillary: 304 mg/dL — ABNORMAL HIGH (ref 70–99)
Glucose-Capillary: 251 mg/dL — ABNORMAL HIGH (ref 70–99)
Glucose-Capillary: 231 mg/dL — ABNORMAL HIGH (ref 70–99)
Glucose-Capillary: 224 mg/dL — ABNORMAL HIGH (ref 70–99)

## 2020-05-22 LAB — COMPREHENSIVE METABOLIC PANEL
ALT: 2076 U/L — ABNORMAL HIGH (ref 0–44)
AST: 1046 U/L — ABNORMAL HIGH (ref 15–41)
Albumin: 3 g/dL — ABNORMAL LOW (ref 3.5–5.0)
Alkaline Phosphatase: 196 U/L — ABNORMAL HIGH (ref 38–126)
Anion gap: 18 — ABNORMAL HIGH (ref 5–15)
BUN: 78 mg/dL — ABNORMAL HIGH (ref 8–23)
CO2: 19 mmol/L — ABNORMAL LOW (ref 22–32)
Calcium: 9.5 mg/dL (ref 8.9–10.3)
Chloride: 110 mmol/L (ref 98–111)
Creatinine, Ser: 1.93 mg/dL — ABNORMAL HIGH (ref 0.44–1.00)
GFR, Estimated: 26 mL/min — ABNORMAL LOW (ref >60)
Glucose, Bld: 489 mg/dL — ABNORMAL HIGH (ref 70–99)
Potassium: 3.1 mmol/L — ABNORMAL LOW (ref 3.5–5.1)
Sodium: 147 mmol/L — ABNORMAL HIGH (ref 135–145)
Total Bilirubin: 5 mg/dL — ABNORMAL HIGH (ref 0.3–1.2)
Total Protein: 6.2 g/dL — ABNORMAL LOW (ref 6.5–8.1)

## 2020-05-22 LAB — ADAMTS13 ACTIVITY: Adamts 13 Activity: 42.4 % — ABNORMAL LOW (ref >66.8)

## 2020-05-22 LAB — ADAMTS13 ACTIVITY REFLEX

## 2020-05-22 LAB — URINALYSIS, ROUTINE W REFLEX MICROSCOPIC
Bacteria, UA: NONE SEEN
Bilirubin Urine: NEGATIVE
Glucose, UA: >=500 mg/dL — AB
Ketones, ur: NEGATIVE mg/dL
Leukocytes,Ua: NEGATIVE
Nitrite: NEGATIVE
Protein, ur: NEGATIVE mg/dL
Specific Gravity, Urine: 1.013 (ref 1.005–1.030)
pH: 5 (ref 5.0–8.0)

## 2020-05-22 LAB — BASIC METABOLIC PANEL
Anion gap: 14 (ref 5–15)
BUN: 79 mg/dL — ABNORMAL HIGH (ref 8–23)
CO2: 23 mmol/L (ref 22–32)
Calcium: 9 mg/dL (ref 8.9–10.3)
Chloride: 113 mmol/L — ABNORMAL HIGH (ref 98–111)
Creatinine, Ser: 1.72 mg/dL — ABNORMAL HIGH (ref 0.44–1.00)
GFR, Estimated: 30 mL/min — ABNORMAL LOW (ref >60)
Glucose, Bld: 249 mg/dL — ABNORMAL HIGH (ref 70–99)
Potassium: 3.8 mmol/L (ref 3.5–5.1)
Sodium: 150 mmol/L — ABNORMAL HIGH (ref 135–145)

## 2020-05-22 LAB — LACTIC ACID, PLASMA: Lactic Acid, Venous: 4.1 mmol/L (ref 0.5–1.9)

## 2020-05-22 LAB — MAGNESIUM: Magnesium: 2.4 mg/dL (ref 1.7–2.4)

## 2020-05-22 LAB — COOXEMETRY PANEL
Carboxyhemoglobin: 1.2 % (ref 0.5–1.5)
Methemoglobin: 0.9 % (ref 0.0–1.5)
O2 Saturation: 55.5 %
Total hemoglobin: 8.5 g/dL — ABNORMAL LOW (ref 12.0–16.0)

## 2020-05-22 LAB — AMMONIA: Ammonia: 41 umol/L — ABNORMAL HIGH (ref 9–35)

## 2020-05-22 LAB — BETA-HYDROXYBUTYRIC ACID: Beta-Hydroxybutyric Acid: 0.27 mmol/L (ref 0.05–0.27)

## 2020-05-22 LAB — FIBRINOGEN: Fibrinogen: 95 mg/dL — CL (ref 210–475)

## 2020-05-22 LAB — PHOSPHORUS: Phosphorus: 1.8 mg/dL — ABNORMAL LOW (ref 2.5–4.6)

## 2020-05-22 MED ORDER — NOREPINEPHRINE 4 MG/250ML-% IV SOLN: 0.0000 ug/min | INTRAVENOUS | Status: DC

## 2020-05-22 MED ORDER — FENTANYL CITRATE (PF) 100 MCG/2ML IJ SOLN
25.0000 ug | Freq: Once | INTRAMUSCULAR | Status: DC
Start: 2020-05-22 — End: 2020-05-25

## 2020-05-22 MED ORDER — POTASSIUM PHOSPHATES 15 MMOLE/5ML IV SOLN
30.0000 mmol | Freq: Once | INTRAVENOUS | Status: AC
  Administered 2020-05-22: 30 mmol via INTRAVENOUS
  Filled 2020-05-22: qty 10

## 2020-05-22 MED ORDER — FUROSEMIDE 10 MG/ML IJ SOLN
80.0000 mg | Freq: Three times a day (TID) | INTRAMUSCULAR | Status: DC
  Administered 2020-05-22 – 2020-05-23 (×6): 80 mg via INTRAVENOUS
  Filled 2020-05-22 (×6): qty 8

## 2020-05-22 MED ORDER — INSULIN ASPART 100 UNIT/ML ~~LOC~~ SOLN
4.0000 [IU] | Freq: Three times a day (TID) | SUBCUTANEOUS | Status: DC
  Administered 2020-05-22 (×3): 4 [IU] via SUBCUTANEOUS

## 2020-05-22 MED ORDER — ROCURONIUM BROMIDE 10 MG/ML (PF) SYRINGE: 60.0000 mg | PREFILLED_SYRINGE | Freq: Once | INTRAVENOUS | Status: AC

## 2020-05-22 MED ORDER — FENTANYL BOLUS VIA INFUSION
25.0000 ug | INTRAVENOUS | Status: DC | PRN
  Filled 2020-05-22: qty 25

## 2020-05-22 MED ORDER — POTASSIUM CHLORIDE 20 MEQ PO PACK
20.0000 meq | PACK | ORAL | Status: AC
  Administered 2020-05-22 (×2): 20 meq
  Filled 2020-05-22 (×2): qty 1

## 2020-05-22 MED ORDER — NOREPINEPHRINE 4 MG/250ML-% IV SOLN
INTRAVENOUS | Status: AC
  Filled 2020-05-22: qty 250

## 2020-05-22 MED ORDER — ROCURONIUM BROMIDE 10 MG/ML (PF) SYRINGE
PREFILLED_SYRINGE | INTRAVENOUS | Status: AC
  Administered 2020-05-22: 60 mg via INTRAVENOUS
  Filled 2020-05-22: qty 10

## 2020-05-22 MED ORDER — FENTANYL 2500MCG IN NS 250ML (10MCG/ML) PREMIX INFUSION
0.0000 ug/h | INTRAVENOUS | Status: DC
  Administered 2020-05-22: 25 ug/h via INTRAVENOUS
  Filled 2020-05-22: qty 250

## 2020-05-22 MED ORDER — INSULIN DETEMIR 100 UNIT/ML ~~LOC~~ SOLN
3.0000 [IU] | Freq: Two times a day (BID) | SUBCUTANEOUS | Status: DC
  Administered 2020-05-22 – 2020-05-23 (×4): 3 [IU] via SUBCUTANEOUS
  Filled 2020-05-22 (×6): qty 0.03

## 2020-05-22 MED ORDER — INSULIN ASPART 100 UNIT/ML ~~LOC~~ SOLN
0.0000 [IU] | SUBCUTANEOUS | Status: DC
  Administered 2020-05-22: 5 [IU] via SUBCUTANEOUS
  Administered 2020-05-22: 8 [IU] via SUBCUTANEOUS
  Administered 2020-05-22: 5 [IU] via SUBCUTANEOUS
  Administered 2020-05-22: 15 [IU] via SUBCUTANEOUS
  Administered 2020-05-22: 11 [IU] via SUBCUTANEOUS
  Administered 2020-05-23 (×2): 5 [IU] via SUBCUTANEOUS
  Administered 2020-05-23: 3 [IU] via SUBCUTANEOUS
  Administered 2020-05-23: 2 [IU] via SUBCUTANEOUS
  Administered 2020-05-23: 5 [IU] via SUBCUTANEOUS
  Administered 2020-05-23: 3 [IU] via SUBCUTANEOUS
  Administered 2020-05-24: 2 [IU] via SUBCUTANEOUS
  Administered 2020-05-24: 3 [IU] via SUBCUTANEOUS

## 2020-05-22 MED ORDER — DOCUSATE SODIUM 50 MG/5ML PO LIQD
100.0000 mg | Freq: Two times a day (BID) | ORAL | Status: DC
  Filled 2020-05-22: qty 10

## 2020-05-22 MED ORDER — SODIUM CHLORIDE 0.9 % IV SOLN
250.0000 mL | INTRAVENOUS | Status: DC
  Administered 2020-05-22: 250 mL via INTRAVENOUS

## 2020-05-22 MED ORDER — ETOMIDATE 2 MG/ML IV SOLN
INTRAVENOUS | Status: AC
  Administered 2020-05-22: 10 mg via INTRAVENOUS
  Filled 2020-05-22: qty 10

## 2020-05-22 MED ORDER — FENTANYL CITRATE (PF) 100 MCG/2ML IJ SOLN: 100.0000 ug | Freq: Once | INTRAMUSCULAR | Status: AC

## 2020-05-22 MED ORDER — FENTANYL CITRATE (PF) 100 MCG/2ML IJ SOLN
INTRAMUSCULAR | Status: AC
  Administered 2020-05-22: 100 ug
  Filled 2020-05-22: qty 2

## 2020-05-22 MED ORDER — DOPAMINE-DEXTROSE 3.2-5 MG/ML-% IV SOLN
5.0000 ug/kg/min | INTRAVENOUS | Status: DC
  Administered 2020-05-22: 2.5 ug/kg/min via INTRAVENOUS
  Filled 2020-05-22: qty 250

## 2020-05-22 MED ORDER — NOREPINEPHRINE 4 MG/250ML-% IV SOLN
INTRAVENOUS | Status: AC
  Administered 2020-05-22: 2 ug/min via INTRAVENOUS
  Filled 2020-05-22: qty 250

## 2020-05-22 MED ORDER — ETOMIDATE 2 MG/ML IV SOLN: 10.0000 mg | Freq: Once | INTRAVENOUS | Status: AC

## 2020-05-22 MED ORDER — PROPOFOL 1000 MG/100ML IV EMUL
5.0000 ug/kg/min | INTRAVENOUS | Status: DC
  Administered 2020-05-22: 5 ug/kg/min via INTRAVENOUS
  Filled 2020-05-22: qty 100

## 2020-05-22 MED ORDER — PHENYLEPHRINE HCL-NACL 10-0.9 MG/250ML-% IV SOLN
25.0000 ug/min | INTRAVENOUS | Status: DC
  Administered 2020-05-22: 25 ug/min via INTRAVENOUS
  Administered 2020-05-23: 55 ug/min via INTRAVENOUS
  Administered 2020-05-23 (×2): 20 ug/min via INTRAVENOUS
  Administered 2020-05-23: 50 ug/min via INTRAVENOUS
  Administered 2020-05-23: 35 ug/min via INTRAVENOUS
  Administered 2020-05-24 (×2): 80 ug/min via INTRAVENOUS
  Filled 2020-05-22 (×8): qty 250

## 2020-05-22 MED ORDER — POLYETHYLENE GLYCOL 3350 17 G PO PACK
17.0000 g | PACK | Freq: Every day | ORAL | Status: DC
  Filled 2020-05-22: qty 1

## 2020-05-22 MED ORDER — FENTANYL 2500MCG IN NS 250ML (10MCG/ML) PREMIX INFUSION
25.0000 ug/h | INTRAVENOUS | Status: DC
  Administered 2020-05-22: 25 ug/h via INTRAVENOUS
  Administered 2020-05-24: 400 ug/h via INTRAVENOUS
  Filled 2020-05-22: qty 250

## 2020-05-22 MED ORDER — SILDENAFIL CITRATE 20 MG PO TABS
20.0000 mg | ORAL_TABLET | Freq: Three times a day (TID) | ORAL | Status: DC
  Administered 2020-05-22 (×2): 20 mg
  Filled 2020-05-22 (×4): qty 1

## 2020-05-22 MED ORDER — PROPOFOL 1000 MG/100ML IV EMUL
0.0000 ug/kg/min | INTRAVENOUS | Status: DC
  Administered 2020-05-22: 10 ug/kg/min via INTRAVENOUS
  Administered 2020-05-22: 5 ug/kg/min via INTRAVENOUS
  Administered 2020-05-23: 10 ug/kg/min via INTRAVENOUS
  Filled 2020-05-22 (×3): qty 100

## 2020-05-22 NOTE — Progress Notes (Signed)
Patient intubated by Duwayne Heck, MD. RRT and 3 RNs present at bedside during procedure. Medications given per MD (see MAR) as follows:   50 mcg Fentanyl '@0455'$  10 mg Etomidate '@0456'$  60 mg Rocuronium '@0456'$  50 mcg Fentanyl '@0500'$ 
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
## 2020-05-22 NOTE — Plan of Care (Signed)
  Interdisciplinary Goals of Care Family Meeting   Date carried out:: 05/22/2020  Location of the meeting: Bedside  Member's involved: Physician, Bedside Registered Nurse and Family Member or next of kin  Durable Power of Attorney or acting medical decision maker: All three daughters: Rennie Natter   Discussion: We discussed goals of care for Lynn Carey .  We discussed her severe multi-organ failure related to cardiogenic shock which is now requiring multiple forms of life support to stay alive.  Given her advanced age the likelihood of survival is low.  They voiced understanding.  They agree to a DNR status and would consider withdrawal of care tomorrow if she is not improving.  She told Dee on Sunday that she did not want prolonged resuscitative measures if the medical therapy we are providing is ineffective.    Code status: Full DNR  Disposition: Continue current acute care  Time spent for the meeting: 15 min  Roselie Awkward 05/22/2020, 3:35 PM

## 2020-05-22 NOTE — Procedures (Addendum)
Intubation Procedure Note  BARNETTA BEVIER  [id number]  January 30, 1941  Date:05/22/20  Time:5:18 AM   Provider Performing:Joleah Kosak Duwayne Heck    Procedure: Intubation (31500)  Indication(s) Respiratory Failure  Consent Risks of the procedure as well as the alternatives and risks of each were explained to the patient and/or caregiver.  Consent for the procedure was obtained and is signed in the bedside chart   Anesthesia Etomidate, Fentanyl and Rocuronium   Time Out Verified patient identification, verified procedure, site/side was marked, verified correct patient position, special equipment/implants available, medications/allergies/relevant history reviewed, required imaging and test results available.   Sterile Technique Usual hand hygeine, masks, and gloves were used   Procedure Description  Patient positioned in bed supine.  Sedation given as noted above.  Patient was intubated with endotracheal tube using Glidescope.  View was Grade 2 only posterior commissure .  Number of attempts was 2.  Colorimetric CO2 detector was consistent with tracheal placement. Fentanyl 50, etomidate 10, roc 76 Took a look at the cords with glidescope, passed easily, but patient developed emesis.  Suctioned away about 100cc from her mouth.   Intubated easily on the second pass.  Sat temporarily dropped to 85%, immediately recovered to 100%.     Complications/Tolerance None; patient tolerated the procedure well.  Emesis Chest X-ray is ordered to verify placement.   EBL none   Specimen(s) None

## 2020-05-22 NOTE — Progress Notes (Signed)
eLink Physician-Brief Progress Note Patient Name: NORMANDIE BLANKS DOB: April 28, 1940 MRN: [id number]   Date of Service  05/22/2020  HPI/Events of Note  Nursing reports that the patient is less responsive. GCS = 8 (per nursing). Blood glucose = 427. Last Ammonia level = 64 on 05/20/2020.   eICU Interventions  Plan: 1. ABG and Ammonia level STAT. 2. Will request that PCCM ground team evaluate the patient at bedside as she may require intubation and ventilation.      Intervention Category Major Interventions: Delirium, psychosis, severe agitation - evaluation and management  Lynwood Kubisiak [place] 05/22/2020, 3:24 AM

## 2020-05-22 NOTE — Progress Notes (Signed)
Subjective:  Patient is intubated and sedated.  Intake/Output from previous day:  I/O last 3 completed shifts: In: 4348.1 [I.V.:1015.1; Blood:1926; Other:150; NG/GT:1070.8; IV Piggyback:186.1] Out: 1180 [Urine:1040; Stool:140] No intake/output data recorded.  Blood pressure (!) 96/55, pulse (!) 101, temperature 98.8 F (37.1 C), temperature source Axillary, resp. rate (!) 23, height 5' (1.524 m), weight 71.9 kg, SpO2 99 %. Physical Exam Vitals and nursing note reviewed.  Constitutional:      Interventions: She is sedated and intubated.  Cardiovascular:     Rate and Rhythm: Regular rhythm. Tachycardia present.     Heart sounds: Normal heart sounds.  Pulmonary:     Effort: She is intubated.     Breath sounds: No rhonchi or rales.  Abdominal:     General: Abdomen is flat.     Palpations: Abdomen is soft.  Musculoskeletal:        General: No swelling.     Lab Results: BMP BNP (last 3 results) Recent Labs    04/16/20 0500 05/20/20 1242 05/22/20 0326  BNP 515.2* >4,500.0* >4,500.0*    ProBNP (last 3 results) No results for input(s): PROBNP in the last 8760 hours. BMP Latest Ref Rng & Units 05/22/2020 05/22/2020 05/22/2020  Glucose 70 - 99 mg/dL - - 648(E)  BUN 8 - 23 mg/dL - - 72(W)  Creatinine 7.21 - 1.00 mg/dL - - 8.28(Q)  BUN/Creat Ratio 12 - 28 - - -  Sodium 135 - 145 mmol/L 148(H) 149(H) 147(H)  Potassium 3.5 - 5.1 mmol/L 3.1(L) 3.2(L) 3.1(L)  Chloride 98 - 111 mmol/L - - 110  CO2 22 - 32 mmol/L - - 19(L)  Calcium 8.9 - 10.3 mg/dL - - 9.5   Hepatic Function Latest Ref Rng & Units 05/22/2020 05/21/2020 05/21/2020  Total Protein 6.5 - 8.1 g/dL 6.2(L) 5.9(L) 6.9  Albumin 3.5 - 5.0 g/dL 3.0(L) 3.1(L) 3.6  AST 15 - 41 U/L 1,046(H) 1,215(H) 1,228(H)  ALT 0 - 44 U/L 2,076(H) 2,023(H) 2,300(H)  Alk Phosphatase 38 - 126 U/L 196(H) 176(H) 174(H)  Total Bilirubin 0.3 - 1.2 mg/dL 5.0(H) 5.3(H) 5.9(H)  Bilirubin, Direct 0.00 - 0.40 mg/dL - - -   CBC Latest Ref Rng & Units  05/22/2020 05/22/2020 05/22/2020  WBC 4.0 - 10.5 K/uL - - 17.2(H)  Hemoglobin 12.0 - 15.0 g/dL 3.3(V) 4.4(Z) 8.3(L)  Hematocrit 36.0 - 46.0 % 25.0(L) 27.0(L) 25.3(L)  Platelets 150 - 400 K/uL - - 30(L)   Lipid Panel     Component Value Date/Time   CHOL 155 05/13/2020 1120   CHOL 159 07/26/2019 1139   TRIG 97 05/09/2020 1120   HDL 34 (L) 05/17/2020 1120   HDL 55 07/26/2019 1139   CHOLHDL 4.6 05/31/2020 1120   VLDL 19 05/20/2020 1120   LDLCALC 102 (H) 05/27/2020 1120   LDLCALC 81 07/26/2019 1139   Cardiac Panel (last 3 results) No results for input(s): CKTOTAL, CKMB, TROPONINI, RELINDX in the last 72 hours.  HEMOGLOBIN A1C Lab Results  Component Value Date   HGBA1C 5.6 04/16/2020   MPG 114.02 04/16/2020   TSH Recent Labs    07/26/19 1139 04/16/20 0443  TSH 2.180 3.378   Imaging: DG CHEST PORT 1 VIEW  Result Date: 05/22/2020 CLINICAL DATA:  Intubation. EXAM: PORTABLE CHEST 1 VIEW COMPARISON:  05/20/2020. FINDINGS: Endotracheal tube tip noted 4 mm above the carina. Proximal repositioning of approximately 3 cm suggested. NG tube noted with tip below left hemidiaphragm. PICC line noted with tip over right brachiocephalic vein. Cardiac pacer noted  stable position. Prior CABG. Cardiomegaly. Bilateral interstitial prominence. CHF could present this fashion. Pneumonitis cannot be excluded. No pleural effusion or pneumothorax. IMPRESSION: 1. Endotracheal tube tip noted 4 mm above the carina. Proximal repositioning of approximately 3 cm suggested. 2. NG tube noted with tip below left hemidiaphragm. PICC line noted with tip over right brachiocephalic vein. 3. Cardiac pacer stable position. Prior CABG. Cardiomegaly. Bilateral interstitial prominence. CHF could present in this fashion. These results will be called to the ordering clinician or representative by the Radiologist Assistant, and communication documented in the PACS or [employer]. Electronically Signed   By: Marcello Moores  Register    On: 05/22/2020 05:36   ECHOCARDIOGRAM COMPLETE  Result Date: 05/21/2020    ECHOCARDIOGRAM REPORT   Patient Name:   Lynn Carey [facility] Date of Exam: 05/20/2020 Medical Rec #:  [id number]       Height:       60.0 in Accession #:    [id number]      Weight:       138.7 lb Date of Birth:  1940-04-16       BSA:          1.598 m Patient Age:    80 years        BP:           148/98 mmHg Patient Gender: F               HR:           93 bpm. Exam Location:  Inpatient Procedure: 2D Echo, Cardiac Doppler, Color Doppler and Intracardiac            Opacification Agent Indications:     Cardiomyopathy-Ischemic I25.5  History:         Patient has prior history of Echocardiogram examinations, most                  recent 05/11/2020. CAD; Risk Factors:Hypertension and Diabetes.  Sonographer:     Bernadene Person RDCS Referring Phys:  Almyra Brace Diagnosing Phys: Adrian Prows MD IMPRESSIONS  1. Left ventricular ejection fraction, by estimation, is 20 to 25%. The left ventricle has severely decreased function. The left ventricle demonstrates global hypokinesis. The left ventricular internal cavity size was mildly dilated. Left ventricular diastolic parameters are consistent with Grade II diastolic dysfunction (pseudonormalization). Elevated left ventricular end-diastolic pressure. The E/e' is 16. There is the interventricular septum is flattened in diastole ('D' shaped left ventricle), consistent with right ventricular volume overload.  2. Right ventricular systolic function was not well visualized. The right ventricular size is mildly enlarged. There is severely elevated pulmonary artery systolic pressure. The estimated right ventricular systolic pressure is 56.9 mmHg.  3. Left atrial size was moderately dilated.  4. The mitral valve is normal in structure. Moderate to severe mitral valve regurgitation.  5. Tricuspid valve regurgitation is severe.  6. The aortic valve is normal in structure. Aortic valve regurgitation is not  visualized. No aortic stenosis is present.  7. The inferior vena cava is dilated in size with <50% respiratory variability, suggesting right atrial pressure of 15 mmHg. FINDINGS  Left Ventricle: Left ventricular ejection fraction, by estimation, is 20 to 25%. The left ventricle has severely decreased function. The left ventricle demonstrates global hypokinesis. Definity contrast agent was given IV to delineate the left ventricular endocardial borders. The left ventricular internal cavity size was mildly dilated. There is no left ventricular hypertrophy. The interventricular septum is flattened in diastole ('D' shaped left  ventricle), consistent with right ventricular volume overload. Left ventricular diastolic parameters are consistent with Grade II diastolic dysfunction (pseudonormalization). Elevated left ventricular end-diastolic pressure. The E/e' is 16. Right Ventricle: The right ventricular size is mildly enlarged. Right vetricular wall thickness was not assessed. Right ventricular systolic function was not well visualized. There is severely elevated pulmonary artery systolic pressure. The tricuspid regurgitant velocity is 3.92 m/s, and with an assumed right atrial pressure of 15 mmHg, the estimated right ventricular systolic pressure is 66.0 mmHg. Left Atrium: Left atrial size was moderately dilated. Right Atrium: Right atrial size was not well visualized. Pericardium: There is no evidence of pericardial effusion. Mitral Valve: The mitral valve is normal in structure. Mild mitral annular calcification. Moderate to severe mitral valve regurgitation. Tricuspid Valve: The tricuspid valve is normal in structure. Tricuspid valve regurgitation is severe. Aortic Valve: The aortic valve is normal in structure. Aortic valve regurgitation is not visualized. No aortic stenosis is present. Pulmonic Valve: The pulmonic valve was grossly normal. Pulmonic valve regurgitation is trivial. Aorta: The aortic root is normal in  size and structure. Venous: The inferior vena cava is dilated in size with less than 50% respiratory variability, suggesting right atrial pressure of 15 mmHg. IAS/Shunts: No atrial level shunt detected by color flow Doppler. Additional Comments: A pacer wire is visualized.  LEFT VENTRICLE PLAX 2D LVIDd:         5.10 cm      Diastology LVIDs:         5.00 cm      LV e' medial:    3.49 cm/s LV PW:         0.80 cm      LV E/e' medial:  24.6 LV IVS:        0.60 cm      LV e' lateral:   5.35 cm/s LVOT diam:     1.60 cm      LV E/e' lateral: 16.0 LV SV:         14 LV SV Index:   9 LVOT Area:     2.01 cm  LV Volumes (MOD) LV vol d, MOD A2C: 160.0 ml LV vol d, MOD A4C: 128.0 ml LV vol s, MOD A2C: 127.0 ml LV vol s, MOD A4C: 105.0 ml LV SV MOD A2C:     33.0 ml LV SV MOD A4C:     128.0 ml LV SV MOD BP:      27.4 ml RIGHT VENTRICLE RV S prime:     2.98 cm/s TAPSE (M-mode): 0.9 cm LEFT ATRIUM           Index       RIGHT ATRIUM           Index LA diam:      3.20 cm 2.00 cm/m  RA Area:     15.50 cm LA Vol (A2C): 50.8 ml 31.80 ml/m RA Volume:   42.10 ml  26.35 ml/m LA Vol (A4C): 52.8 ml 33.05 ml/m  AORTIC VALVE LVOT Vmax:   48.30 cm/s LVOT Vmean:  32.800 cm/s LVOT VTI:    0.071 m  AORTA Ao Root diam: 2.20 cm Ao Asc diam:  2.30 cm MITRAL VALVE                 TRICUSPID VALVE MV Area (PHT): 6.17 cm      TR Peak grad:   61.5 mmHg MV Decel Time: 123 msec      TR Vmax:  392.00 cm/s MR Peak grad:    112.8 mmHg MR Mean grad:    74.0 mmHg   SHUNTS MR Vmax:         531.00 cm/s Systemic VTI:  0.07 m MR Vmean:        414.0 cm/s  Systemic Diam: 1.60 cm MR PISA:         1.57 cm MR PISA Eff ROA: 9 mm MR PISA Radius:  0.50 cm MV E velocity: 85.70 cm/s MV A velocity: 52.30 cm/s MV E/A ratio:  1.64 Yates Decamp MD Electronically signed by Yates Decamp MD Signature Date/Time: 05/21/2020/9:50:52 AM    Final     Coronary angiogram 11/25/2017: LM: 95% diffuse disease (Unchanged from prior cath in 06/2017) LAD: 100% ostially occluded  (Unchanged from prior cath in 06/2017) LCx: 100% ostially occluded (Unchanged from prior cath in 06/2017) RCA: 100% known proximal occlusion (Not visualized today) LIMA-LAD: Patent with good distal flow SVG-RCA: Patent with atent ostial stent. No other significant stenosis. Distal RCA has moderate disease (Unchanged from prior cath in 06/2017). RCA to LCx collaterals seen LVEDP 16-20 mmHg  Carotid artery duplex02/05/2020: Stenosis in the right internal carotid artery (50-69%). Upper end of spectrum.  Stenosis in the left internal carotid artery (>=70%). The left PSV internal/common carotid artery ratio of 6.45 and peak velocity of 228/56 cm/S is consistent with a stenosis of >70%. Antegrade right vertebral artery flow. Antegrade left vertebral artery flow. No significant change from 07/25/2019. Follow up in six months is appropriate if clinically indicated.  Echocardiogram 05/20/2020:   1. Left ventricular ejection fraction, by estimation, is 20 to 25%. The left ventricle has severely decreased function. The left ventricle demonstrates global hypokinesis. The left ventricular internal cavity size was mildly dilated. Left ventricular diastolic parameters are consistent with Grade II diastolic dysfunction (pseudonormalization). Elevated left ventricular enddiastolic pressure. The E/e' is 16. There is the interventricular septum is flattened in diastole ('D' shaped left ventricle), consistent with right ventricular volume overload. 2. Right ventricular systolic function was not well visualized. The right ventricular size is mildly enlarged. There is severely elevated pulmonary artery systolic pressure. The estimated right ventricular systolic pressure is 76.5 mmHg. 3. Left atrial size was moderately dilated. 4. The mitral valve is normal in structure. Moderate to severe mitral valve regurgitation. 5. Tricuspid valve regurgitation is severe. 6. The aortic valve is normal in structure.  Aortic valve regurgitation is not visualized. No aortic stenosis is present. 7. The inferior vena cava is dilated in size with <50% respiratory variability, suggesting right atrial pressure of 15 mmHg. 8.  Compared to the study done on 05/28/2020, no significant change in LV, D-shaped septum is new and moderate pulmonary hypertension now is severe pulmonary hypertension.  Right ventricular involvement new.   EKG:  EKG 06/01/2020: Normal sinus rhythm with rate of 87 bpm, left axis deviation, left anterior fascicular block, ST-T wave abnormality, inferior and lateral ischemia.  Compared to 05/15/2020, inferior ST depression new.  No change in lateral ST depression.  Scheduled Meds: . chlorhexidine  15 mL Mouth Rinse BID  . Chlorhexidine Gluconate Cloth  6 each Topical Daily  . furosemide  80 mg Intravenous Q8H  . insulin aspart  0-15 Units Subcutaneous Q4H  . insulin aspart  4 Units Subcutaneous TID WC  . insulin detemir  3 Units Subcutaneous BID  . latanoprost  1 drop Both Eyes QHS  . mouth rinse  15 mL Mouth Rinse q12n4p  . pantoprazole sodium  40 mg Per  Tube Daily  . potassium chloride  20 mEq Per Tube Q4H  . sildenafil  20 mg Per Tube TID  . sodium chloride flush  10-40 mL Intracatheter Q12H  . sodium chloride flush  3 mL Intravenous Once   Continuous Infusions: . acetylcysteine 6.235 mg/kg/hr (05/22/20 0700)  . DOPamine    . feeding supplement (OSMOLITE 1.5 CAL) Stopped (05/22/20 0500)  . fentaNYL infusion INTRAVENOUS 25 mcg/hr (05/22/20 0700)  . milrinone 0.25 mcg/kg/min (05/22/20 0700)  . potassium PHOSPHATE IVPB (in mmol) 30 mmol (05/22/20 0824)  . propofol (DIPRIVAN) infusion 5 mcg/kg/min (05/22/20 0700)   PRN Meds:.docusate, lip balm, polyethylene glycol, sodium chloride flush  Assessment/Plan:  Lynn Carey is a 80 y.o. AA female with CAD S/PCABG in 1997 with LIMA to LAD and SVG to RCA and PCI to SVG 06/23/17, ischemic and nonischemic cardiomyopathy with  decreased EF of 20% (out of proportion to CAD) SP single-chamber Medtronic ICD implantation in January 2021 for primary prevention of sudden cardiac death. She has bilateral asymptomatic high grade (70% by duplex) carotid stenosis, hyperlipidemia, stage IIIb chronic kidney disease, asthmatic bronchitis and hypertension.   She presented to the hospital on 05/21/2020 with acute mental status change, abdominal discomfort.  She was found to be in hepatic encephalopathy with markedly abnormal LFTs.  1.  Acute hepatic encephalopathy secondary to congestive liver 2.  Acute on chronic systolic heart failure 3.  Respiratory distress, presently intubated this morning at 5 AM on 05/22/2020 4.  Severe secondary pulmonary hypertension, findings suggestive of acute cor pulmonale probably related to acute LV failure.  Do not suspect acute pulmonary embolism. 5.  Iatrogenic hypotension related to sedation, vasodilating agents including Primacor and sildenafil.  Recommendation: Discussed with pulmonary critical care.  Agree with starting the patient on inotropic support with dopamine between 3 to 5 mcg/kg/min.  Continue Primacor for now.  Renal function has relatively remained stable acute stage IV on chronic stage IIIb kidney disease. Patient is now intubated, continue supportive care for now, expect improvement in diuresis.  Will increase Lasix to 80 mg 3 times daily.  Continue potassium supplementation.  Follow-up on serial BMP.  We will continue sildenafil for now unless systolic blood pressure drops below 90 mmHg.  35 min of critical care in evaluation of hemodynamics, medication changes, coordination of care and chart review.    Adrian Prows, MD, [facility] 05/22/2020, 9:31 AM Office: [phone] Pager: [phone]  If no answer: [phone]

## 2020-05-22 NOTE — Progress Notes (Addendum)
Called to bedside to evaluate patient.  More obtunded, minimally responsive to sternal rub, barely opens eyes.  Tachypneic generally in the high 30s.  ABG shows respiratory alkalosis.   CMP, CBC, Trop BHOB pending. CVP increased slightly from earlier at 27-28 Lactate '40mg'$  IV given.   Discussed with family, daughter Willette Cluster and daughter D.   They both agree to a short term of aggressive care and intubation.   D is coming in today from [place].

## 2020-05-22 NOTE — Progress Notes (Signed)
   05/22/20 1118  Vent Select  Invasive or Noninvasive Invasive  Adult Vent Y  Airway 7.5 mm  Placement Date/Time: 05/22/20 0500   Airway Device: Endotracheal Tube  Size (mm): 7.5 mm  Secured at (cm): 22 cm  Secured at (cm) 21 cm  Measured From Lips  Secured Location Left  Secured By Actuary Repositioned Yes  Site Condition Dry  Adult Ventilator Settings  Vent Type Servo i  Humidity HME  Vent Mode PRVC  Vt Set 370 mL  Set Rate 16 bmp  FiO2 (%) 40 %  I Time 0.9 Sec(s)  PEEP 5 cmH20  Adult Ventilator Measurements  Peak Airway Pressure 20 L/min  Mean Airway Pressure 10 cmH20  Plateau Pressure 18 cmH20  Resp Rate Spontaneous 5 br/min  Resp Rate Total 21 br/min  Exhaled Vt 356 mL  Measured Ve 6.8 mL  I:E Ratio Measured 1:2.3  Auto PEEP 0 cmH20  Total PEEP 5 cmH20  SpO2 100 %  Adult Ventilator Alarms  Alarms On Y  Ve High Alarm 26 L/min  Ve Low Alarm 4 L/min  Resp Rate High Alarm 35 br/min  Resp Rate Low Alarm 8  PEEP Low Alarm 2 cmH2O  Press High Alarm 40 cmH2O  VAP Prevention  HME changed No  Ventilator changed No  Transported while on vent No  HOB> 30 Degrees Y  Breath Sounds  Bilateral Breath Sounds Clear;Diminished

## 2020-05-22 NOTE — Progress Notes (Signed)
   05/22/20 0759  Vent Select  Invasive or Noninvasive Invasive  Adult Vent Y  Airway 7.5 mm  Placement Date/Time: 05/22/20 0500   Airway Device: Endotracheal Tube  Size (mm): 7.5 mm  Secured at (cm): 22 cm  Secured at (cm) 21 cm  Measured From Lips  Secured Location Right  Secured By Charity fundraiser  Cuff Pressure (cm H2O) 30 cm H2O  Site Condition Dry  Adult Ventilator Settings  Vent Type Servo i  Humidity HME  Vent Mode PRVC  Vt Set 370 mL  Set Rate 16 bmp  FiO2 (%) 40 %  I Time 0.9 Sec(s)  PEEP 5 cmH20  Adult Ventilator Measurements  Peak Airway Pressure 19 L/min  Mean Airway Pressure 9 cmH20  Plateau Pressure 18 cmH20  Resp Rate Spontaneous 5 br/min  Resp Rate Total 21 br/min  Exhaled Vt 376 mL  Measured Ve 8.6 mL  I:E Ratio Measured 1:1.6  Auto PEEP 0 cmH20  Total PEEP 5 cmH20  SpO2 99 %  Adult Ventilator Alarms  Alarms On Y  Ve High Alarm 26 L/min  Ve Low Alarm 4 L/min  Resp Rate High Alarm 35 br/min  Resp Rate Low Alarm 8  PEEP Low Alarm 2 cmH2O  Press High Alarm 40 cmH2O  VAP Prevention  Cuff pressure (initial) 30 cm H2O  HME changed No  Ventilator changed No  Transported while on vent No  HOB> 30 Degrees Y  Breath Sounds  Bilateral Breath Sounds Diminished  Airway Suctioning/Secretions  Suction Type ETT  Suction Device  Catheter  Secretion Amount Small  Secretion Color Tan  Secretion Consistency Thick  Suction Tolerance Tolerated well  Suctioning Adverse Effects None

## 2020-05-22 NOTE — Progress Notes (Signed)
Critical report called from Radiology. Per Radiology ETT needs to be pulled back. Information relayed to Dr Duwayne Heck. Dr Duwayne Heck requested ETT to be pulled back 2cm. RT called and made aware.

## 2020-05-22 NOTE — Progress Notes (Signed)
eLink Physician-Brief Progress Note Patient Name: Lynn Carey DOB: Sep 10, 1940 MRN: [id number]   Date of Service  05/22/2020  HPI/Events of Note  Hypotension, patient reportedly will be transitioned to comfort measures tomorrow.  eICU Interventions  Peripheral Phenylephrine ordered until formal transition  to comfort measure in a.m.        Frederik Pear 05/22/2020, 8:43 PM

## 2020-05-23 DIAGNOSIS — N179 Acute kidney failure, unspecified: Secondary | ICD-10-CM | POA: Diagnosis not present

## 2020-05-23 DIAGNOSIS — J9601 Acute respiratory failure with hypoxia: Secondary | ICD-10-CM | POA: Diagnosis present

## 2020-05-23 DIAGNOSIS — I255 Ischemic cardiomyopathy: Secondary | ICD-10-CM | POA: Diagnosis not present

## 2020-05-23 DIAGNOSIS — K7200 Acute and subacute hepatic failure without coma: Secondary | ICD-10-CM | POA: Diagnosis not present

## 2020-05-23 DIAGNOSIS — G9341 Metabolic encephalopathy: Secondary | ICD-10-CM | POA: Diagnosis not present

## 2020-05-23 LAB — COMPREHENSIVE METABOLIC PANEL
ALT: 1352 U/L — ABNORMAL HIGH (ref 0–44)
AST: 342 U/L — ABNORMAL HIGH (ref 15–41)
Albumin: 2.5 g/dL — ABNORMAL LOW (ref 3.5–5.0)
Alkaline Phosphatase: 177 U/L — ABNORMAL HIGH (ref 38–126)
Anion gap: 14 (ref 5–15)
BUN: 76 mg/dL — ABNORMAL HIGH (ref 8–23)
CO2: 26 mmol/L (ref 22–32)
Calcium: 9 mg/dL (ref 8.9–10.3)
Chloride: 111 mmol/L (ref 98–111)
Creatinine, Ser: 1.5 mg/dL — ABNORMAL HIGH (ref 0.44–1.00)
GFR, Estimated: 35 mL/min — ABNORMAL LOW (ref >60)
Glucose, Bld: 241 mg/dL — ABNORMAL HIGH (ref 70–99)
Potassium: 3.6 mmol/L (ref 3.5–5.1)
Sodium: 151 mmol/L — ABNORMAL HIGH (ref 135–145)
Total Bilirubin: 3.5 mg/dL — ABNORMAL HIGH (ref 0.3–1.2)
Total Protein: 5.6 g/dL — ABNORMAL LOW (ref 6.5–8.1)

## 2020-05-23 LAB — GLUCOSE, CAPILLARY
Glucose-Capillary: 205 mg/dL — ABNORMAL HIGH (ref 70–99)
Glucose-Capillary: 180 mg/dL — ABNORMAL HIGH (ref 70–99)
Glucose-Capillary: 201 mg/dL — ABNORMAL HIGH (ref 70–99)
Glucose-Capillary: 212 mg/dL — ABNORMAL HIGH (ref 70–99)
Glucose-Capillary: 223 mg/dL — ABNORMAL HIGH (ref 70–99)
Glucose-Capillary: 145 mg/dL — ABNORMAL HIGH (ref 70–99)

## 2020-05-23 LAB — CBC
HCT: 29.2 % — ABNORMAL LOW (ref 36.0–46.0)
Hemoglobin: 9.2 g/dL — ABNORMAL LOW (ref 12.0–15.0)
MCH: 29.1 pg (ref 26.0–34.0)
MCHC: 31.5 g/dL (ref 30.0–36.0)
MCV: 92.4 fL (ref 80.0–100.0)
Platelets: 43 10*3/uL — ABNORMAL LOW (ref 150–400)
RBC: 3.16 MIL/uL — ABNORMAL LOW (ref 3.87–5.11)
RDW: 17.4 % — ABNORMAL HIGH (ref 11.5–15.5)
WBC: 21.5 10*3/uL — ABNORMAL HIGH (ref 4.0–10.5)
nRBC: 10.3 % — ABNORMAL HIGH (ref 0.0–0.2)

## 2020-05-23 LAB — LACTIC ACID, PLASMA: Lactic Acid, Venous: 2.1 mmol/L (ref 0.5–1.9)

## 2020-05-23 LAB — BRAIN NATRIURETIC PEPTIDE: B Natriuretic Peptide: >4500 pg/mL — ABNORMAL HIGH (ref 0.0–100.0)

## 2020-05-23 LAB — PHOSPHORUS: Phosphorus: 2.1 mg/dL — ABNORMAL LOW (ref 2.5–4.6)

## 2020-05-23 LAB — COOXEMETRY PANEL
Carboxyhemoglobin: 1.4 % (ref 0.5–1.5)
Methemoglobin: 1 % (ref 0.0–1.5)
O2 Saturation: 87.2 %
Total hemoglobin: 9.4 g/dL — ABNORMAL LOW (ref 12.0–16.0)

## 2020-05-23 LAB — TRIGLYCERIDES: Triglycerides: 113 mg/dL (ref <150)

## 2020-05-23 LAB — MAGNESIUM: Magnesium: 1.9 mg/dL (ref 1.7–2.4)

## 2020-05-23 MED ORDER — INSULIN ASPART 100 UNIT/ML ~~LOC~~ SOLN
3.0000 [IU] | SUBCUTANEOUS | Status: DC
  Administered 2020-05-23 – 2020-05-24 (×5): 3 [IU] via SUBCUTANEOUS

## 2020-05-23 MED ORDER — POLYETHYLENE GLYCOL 3350 17 G PO PACK: 17.0000 g | PACK | Freq: Every day | ORAL | Status: DC | PRN

## 2020-05-23 MED ORDER — FREE WATER
200.0000 mL | Freq: Four times a day (QID) | Status: DC
  Administered 2020-05-23 – 2020-05-24 (×4): 200 mL

## 2020-05-23 MED ORDER — ORAL CARE MOUTH RINSE
15.0000 mL | OROMUCOSAL | Status: DC
  Administered 2020-05-23 – 2020-05-24 (×14): 15 mL via OROMUCOSAL

## 2020-05-23 MED ORDER — DOCUSATE SODIUM 50 MG/5ML PO LIQD: 100.0000 mg | Freq: Two times a day (BID) | ORAL | Status: DC | PRN

## 2020-05-23 NOTE — Progress Notes (Signed)
Nutrition Follow-up  DOCUMENTATION CODES:   Not applicable  INTERVENTION:   If plans are for comfort care, recommend D/C tube feeding.  NUTRITION DIAGNOSIS:   Inadequate oral intake related to lethargy/confusion as evidenced by NPO status.  Ongoing   GOAL:   Patient will meet greater than or equal to 90% of their needs   Met with TF  MONITOR:   TF tolerance,Diet advancement,Labs,Skin  REASON FOR ASSESSMENT:   Consult Enteral/tube feeding initiation and management  ASSESSMENT:   80 yo female admitted with acute liver failure with encephalopathy. PMH includes CAD, cardiomyopathy, ICD, carotid stenosis, HLD, CKD stage 3, asthmatic bronchitis, HTN.   Discussed patient in ICU rounds and with RN today. Patient was intubated on 2/16. She is currently receiving Osmolite 1.5 at 50 ml/h via NG tube, providing 1800 kcal, 75 gm protein, 914 ml free water daily. Free water flushes 200 ml every 6 hours.   Noted plans for transition to comfort care when more family arrives which may take place this afternoon.   Patient is currently intubated on ventilator support MV: 6.6 L/min Temp (24hrs), Avg:98.9 F (37.2 C), Min:98.3 F (36.8 C), Max:99.4 F (37.4 C)  Labs and medications reviewed.   Diet Order:   Diet Order            Diet NPO time specified  Diet effective now                 EDUCATION NEEDS:   Not appropriate for education at this time  Skin:  Skin Assessment: Reviewed RN Assessment  Last BM:  2/17 rectal tube  Height:   Ht Readings from Last 1 Encounters:  05/23/2020 5' (1.524 m)    Weight:   Wt Readings from Last 1 Encounters:  05/23/20 66.2 kg    Ideal Body Weight:  45.5 kg  BMI:  Body mass index is 28.5 kg/m.  Estimated Nutritional Needs:   Kcal:  1700-1900  Protein:  75-90 gm  Fluid:  1.7 L    Lucas Mallow, RD, LDN, CNSC Please refer to Amion for contact information.

## 2020-05-23 NOTE — Progress Notes (Signed)
Upon assessment, patient has unequal pupils and soft blood pressures with the map lower than 65. [place] notified. Peripheral phenylephrine ordered.

## 2020-05-23 NOTE — Progress Notes (Signed)
Patient was seen by my partner this morning, I also discussed with patient's daughter Willette Cluster regarding end-of-life issues.  In view of advanced liver disease secondary to hepatic congestion, advanced heart failure, advanced age, chronic renal insufficiency, with multiple medical comorbidity, continued need for pressors, her wishes not to be on prolonged life support, family is leaning towards making patient comfortable and awaiting all family members to arrive.  I discussed with him regarding turning the ICD off as it will not change the prognosis, they agreed.   Adrian Prows, MD, [facility] 05/23/2020, 2:41 PM Office: [phone] Pager: [phone]
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
## 2020-05-23 NOTE — Progress Notes (Signed)
Noted the discussion re: DNR and moving towards comfort care, in view of patient's multiorgan failure, poor response to initial treatment, and her wishes of not wanting life prolonging measures. Agree with the overall plan. Please contact in case of any questions.    [name], MD Pager: [phone] Office: [phone]

## 2020-05-24 ENCOUNTER — Inpatient Hospital Stay (HOSPITAL_COMMUNITY): Payer: Medicare HMO

## 2020-05-24 DIAGNOSIS — J9601 Acute respiratory failure with hypoxia: Secondary | ICD-10-CM | POA: Diagnosis not present

## 2020-05-24 DIAGNOSIS — G9341 Metabolic encephalopathy: Secondary | ICD-10-CM | POA: Diagnosis not present

## 2020-05-24 DIAGNOSIS — K7200 Acute and subacute hepatic failure without coma: Secondary | ICD-10-CM | POA: Diagnosis not present

## 2020-05-24 LAB — COMPREHENSIVE METABOLIC PANEL
ALT: 1033 U/L — ABNORMAL HIGH (ref 0–44)
AST: 184 U/L — ABNORMAL HIGH (ref 15–41)
Albumin: 2.6 g/dL — ABNORMAL LOW (ref 3.5–5.0)
Alkaline Phosphatase: 162 U/L — ABNORMAL HIGH (ref 38–126)
Anion gap: 13 (ref 5–15)
BUN: 56 mg/dL — ABNORMAL HIGH (ref 8–23)
CO2: 32 mmol/L (ref 22–32)
Calcium: 8.9 mg/dL (ref 8.9–10.3)
Chloride: 108 mmol/L (ref 98–111)
Creatinine, Ser: 1.02 mg/dL — ABNORMAL HIGH (ref 0.44–1.00)
GFR, Estimated: 56 mL/min — ABNORMAL LOW (ref >60)
Glucose, Bld: 177 mg/dL — ABNORMAL HIGH (ref 70–99)
Potassium: 3.6 mmol/L (ref 3.5–5.1)
Sodium: 153 mmol/L — ABNORMAL HIGH (ref 135–145)
Total Bilirubin: 3.9 mg/dL — ABNORMAL HIGH (ref 0.3–1.2)
Total Protein: 6.1 g/dL — ABNORMAL LOW (ref 6.5–8.1)

## 2020-05-24 LAB — GLUCOSE, CAPILLARY
Glucose-Capillary: 128 mg/dL — ABNORMAL HIGH (ref 70–99)
Glucose-Capillary: 183 mg/dL — ABNORMAL HIGH (ref 70–99)

## 2020-05-24 LAB — BRAIN NATRIURETIC PEPTIDE: B Natriuretic Peptide: 3145.6 pg/mL — ABNORMAL HIGH (ref 0.0–100.0)

## 2020-05-24 LAB — CBC
HCT: 37.6 % (ref 36.0–46.0)
Hemoglobin: 11.8 g/dL — ABNORMAL LOW (ref 12.0–15.0)
MCH: 29.4 pg (ref 26.0–34.0)
MCHC: 31.4 g/dL (ref 30.0–36.0)
MCV: 93.5 fL (ref 80.0–100.0)
Platelets: 53 10*3/uL — ABNORMAL LOW (ref 150–400)
RBC: 4.02 MIL/uL (ref 3.87–5.11)
RDW: 18.6 % — ABNORMAL HIGH (ref 11.5–15.5)
WBC: 24.4 10*3/uL — ABNORMAL HIGH (ref 4.0–10.5)
nRBC: 9.3 % — ABNORMAL HIGH (ref 0.0–0.2)

## 2020-05-24 LAB — MAGNESIUM: Magnesium: 1.4 mg/dL — ABNORMAL LOW (ref 1.7–2.4)

## 2020-05-24 LAB — COOXEMETRY PANEL
Carboxyhemoglobin: 1.5 % (ref 0.5–1.5)
Methemoglobin: 1.2 % (ref 0.0–1.5)
O2 Saturation: 78.5 %
Total hemoglobin: 11.8 g/dL — ABNORMAL LOW (ref 12.0–16.0)

## 2020-05-24 LAB — PHOSPHORUS: Phosphorus: 2.1 mg/dL — ABNORMAL LOW (ref 2.5–4.6)

## 2020-05-24 MED ORDER — POLYVINYL ALCOHOL 1.4 % OP SOLN
1.0000 [drp] | Freq: Four times a day (QID) | OPHTHALMIC | Status: DC | PRN
Start: 2020-05-24 — End: 2020-05-25
  Filled 2020-05-24: qty 15

## 2020-05-24 MED ORDER — MIDAZOLAM 50MG/50ML (1MG/ML) PREMIX INFUSION: 0.0000 mg/h | INTRAVENOUS | Status: DC

## 2020-05-24 MED ORDER — FREE WATER
400.0000 mL | Status: DC
  Administered 2020-05-24: 400 mL

## 2020-05-24 MED ORDER — GLYCOPYRROLATE 0.2 MG/ML IJ SOLN: 0.2000 mg | INTRAMUSCULAR | Status: DC | PRN

## 2020-05-24 MED ORDER — MAGNESIUM SULFATE 4 GM/100ML IV SOLN
4.0000 g | Freq: Once | INTRAVENOUS | Status: DC
  Filled 2020-05-24: qty 100

## 2020-05-24 MED ORDER — FREE WATER: 200.0000 mL | Status: DC

## 2020-05-24 MED ORDER — NOREPINEPHRINE 4 MG/250ML-% IV SOLN
0.0000 ug/min | INTRAVENOUS | Status: DC
  Administered 2020-05-24: 8 ug/min via INTRAVENOUS
  Filled 2020-05-24: qty 250

## 2020-05-24 MED ORDER — DEXTROSE 5 % IV SOLN: INTRAVENOUS | Status: DC

## 2020-05-24 MED ORDER — POTASSIUM PHOSPHATES 15 MMOLE/5ML IV SOLN
15.0000 mmol | Freq: Once | INTRAVENOUS | Status: DC
  Filled 2020-05-24: qty 5

## 2020-05-24 MED ORDER — ACETAMINOPHEN 650 MG RE SUPP: 650.0000 mg | Freq: Four times a day (QID) | RECTAL | Status: DC | PRN

## 2020-05-24 MED ORDER — MIDAZOLAM HCL 2 MG/2ML IJ SOLN: 1.0000 mg | INTRAMUSCULAR | Status: DC | PRN

## 2020-05-24 MED ORDER — GLYCOPYRROLATE 1 MG PO TABS
1.0000 mg | ORAL_TABLET | ORAL | Status: DC | PRN
Start: 2020-05-24 — End: 2020-05-25

## 2020-05-24 MED ORDER — DIPHENHYDRAMINE HCL 50 MG/ML IJ SOLN: 25.0000 mg | INTRAMUSCULAR | Status: DC | PRN

## 2020-05-24 MED ORDER — FUROSEMIDE 10 MG/ML IJ SOLN: 80.0000 mg | Freq: Two times a day (BID) | INTRAMUSCULAR | Status: DC

## 2020-05-24 MED ORDER — ACETAMINOPHEN 325 MG PO TABS: 650.0000 mg | ORAL_TABLET | Freq: Four times a day (QID) | ORAL | Status: DC | PRN

## 2020-05-24 MED ORDER — MIDAZOLAM BOLUS VIA INFUSION (WITHDRAWAL LIFE SUSTAINING TX)
2.0000 mg | INTRAVENOUS | Status: DC | PRN
  Filled 2020-05-24: qty 2

## 2020-05-24 MED ORDER — HALOPERIDOL LACTATE 5 MG/ML IJ SOLN: 2.5000 mg | INTRAMUSCULAR | Status: DC | PRN

## 2020-05-24 MED ORDER — MIDAZOLAM HCL 2 MG/2ML IJ SOLN
2.0000 mg | INTRAMUSCULAR | Status: DC | PRN
  Administered 2020-05-24 (×3): 4 mg via INTRAVENOUS
  Filled 2020-05-24 (×3): qty 4
  Filled 2020-05-24: qty 2
  Filled 2020-05-24: qty 4

## 2020-05-25 LAB — CULTURE, BLOOD (ROUTINE X 2): Culture: NO GROWTH

## 2020-06-04 NOTE — Progress Notes (Signed)
Held family meeting today with patient's daughters, [name], and D. We discussed the patients clinical course at lengths and counseled them regarding the patients grave prognosis. I highlighted her poor baseline cardiac function, increased need for pressors, ventilatory dependency, and multiorgan injury as significant limitation to her recovery. We discussed the patients own goals of care in which she states that she would not like to be kept on life support if her illness is irreversible. Considering this information, the family has decided to transition for comfort measures inline with the patients GOC. We will allow the family time to visit with the patient prior to weaning pressors and performing a compassionate extubation. The family has been counseled regarding this process and are in agreement with the plan.   Lawerance Cruel, D.O.  Internal Medicine Resident, PGY-2 [facility]  Pager: [phone] 11:14 AM, 09-Jun-2020

## 2020-06-04 NOTE — Progress Notes (Addendum)
Called and notified Elink the peripheral phenylephrine has been infusing for 24 hours in a peripheral IV. MD said it was ok, but if the patient need more than 276mg switch to picc line
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
## 2020-06-04 NOTE — Procedures (Signed)
Extubation Procedure Note  Patient Details:   Name: Lynn Carey DOB: [birth year] MRN: [id number]   Airway Documentation:    Vent end date: 05/26/2020 Vent end time: 1431   Evaluation  O2 sats: stable throughout Complications: No apparent complications Patient did tolerate procedure well. Bilateral Breath Sounds: Rhonchi   No, pt could not speak post extubation.  Pt extubated per room air per physician's order and in accordance with the family's wishes.  Earney Navy 05/26/20, 2:32 PM

## 2020-06-04 DEATH — deceased

## 2020-06-10 DIAGNOSIS — E870 Hyperosmolality and hypernatremia: Secondary | ICD-10-CM | POA: Diagnosis not present

## 2020-06-10 DIAGNOSIS — R570 Cardiogenic shock: Secondary | ICD-10-CM | POA: Diagnosis present

## 2020-06-13 ENCOUNTER — Encounter: Payer: Self-pay | Admitting: Internal Medicine

## 2020-06-14 ENCOUNTER — Ambulatory Visit: Payer: Medicare HMO | Admitting: Cardiology

## 2020-07-05 NOTE — Death Summary Note (Addendum)
DEATH SUMMARY   Patient Details  Name: Lynn Carey MRN: [id number] DOB: 1941/01/16  Admission/Discharge Information   Admit Date:  23-May-2020  Date of Death: Date of Death: May 28, 2020  Time of Death: Time of Death: 06-26-1736  Length of Stay: 5  Referring Physician: Glendale Chard, MD   Reason(s) for Hospitalization  Acute altered mental status, confusion  Diagnoses  Preliminary cause of death:   Secondary Diagnoses (including complications and co-morbidities):  Principal Problem:   Cardiogenic shock ([place]) Active Problems:   Mixed hyperlipidemia   Essential hypertension   DM (diabetes mellitus) ([place])   Ischemic cardiomyopathy   Bilateral carotid artery stenosis   Chronic systolic heart failure ([place])   ICD Single chamber Medtronic VISIA MRI VR [id number] 04/21/2019   DM (diabetes mellitus), type 2 with complications ([place])   AKI (acute kidney injury) ([place])   Acute liver failure   Hepatic encephalopathy ([place])   Acute metabolic encephalopathy   Acute respiratory failure with hypoxia ([place])   Hypernatremia Chronic renal failure Stage 3a  Brief Hospital Course (including significant findings, care, treatment, and services provided and events leading to death)  Lynn Carey is a 80 y.o. year old female with ischemic cardiomyopathy due to CAD/CABG.  She has LVEF 20% with an ICD.  Also with a history of diabetes type 2, CKD stage III, hypertension, carotid artery disease.  She was admitted with acute encephalopathy in the setting of cardiogenic shock with associated hepatic failure, renal failure and coagulopathy on 23-May-2020.    She required pressor and inotropic support, was treated empirically with broad-spectrum antibiotics given the concern for possible component of sepsis.  These were discontinued in absence of a clear focus.  She underwent empiric diuresis for volume overload which was complicated by acute on chronic renal failure and hypernatremia.  Her diuretics were then held.  Her  renal function did improve as did her transaminitis with restoration of perfusion pressures and n-acetylcysteine.  Unfortunately her encephalopathy persisted.  Sedating medications were held without any significant improvement.  Discussions were undertaken with the patient's family and it was unfortunately felt that her multiorgan failure including her encephalopathy was due to an irreversible process namely her ischemic cardiomyopathy and associated shock.  Decision was made to transition to comfort care and she underwent compassionate extubation on 05-28-2020.  She expired on the same day.   Pertinent Labs and Studies  Significant Diagnostic Studies CT ABDOMEN PELVIS WO CONTRAST  Result Date: 2020-05-23 CLINICAL DATA:  Acute abdominal pain, nonlocalized, elevated LFTs EXAM: CT ABDOMEN AND PELVIS WITHOUT CONTRAST TECHNIQUE: Multidetector CT imaging of the abdomen and pelvis was performed following the standard protocol without IV contrast. COMPARISON:  04/15/2020 FINDINGS: Lower chest: Minimal basilar atelectasis versus scarring. Heart is enlarged. Pacer wires in the right side of the heart. Remote median sternotomy. No pericardial or pleural effusion. Degenerative changes of the lower thoracic spine. Hepatobiliary: Left hepatic hypodense cyst measuring 1.8 cm is unchanged. No intrahepatic biliary dilatation or new focal hepatic abnormality within the limits of noncontrast imaging. No biliary dilatation. Gallbladder unremarkable. Common bile duct nondilated. Pancreas: Unremarkable. No pancreatic ductal dilatation or surrounding inflammatory changes. Spleen: Normal in size without focal abnormality. Adrenals/Urinary Tract: Normal adrenal glands. No acute renal obstruction or hydronephrosis. No focal renal contour abnormality. Ureters are symmetric and decompressed. No ureteral obstruction or ureteral calculus. Bladder unremarkable. Stomach/Bowel: Negative for bowel obstruction, significant dilatation, ileus,  or free air. Appendix not visualized. No acute inflammatory process in the right lower quadrant. No  free fluid, fluid collection, hemorrhage, hematoma, abscess or ascites. Vascular/Lymphatic: Extensive atherosclerosis of the aorta. Negative for aneurysm. No retroperitoneal hemorrhage or hematoma. No evidence of rupture. Evaluation the vasculature is limited without IV contrast. No bulky adenopathy. Reproductive: Uterus and adnexa within normal limits for age. Other: Intact abdominal wall.  Negative for hernia. Musculoskeletal: Artifact from the bilateral hip replacements. Degenerative changes of the spine. IMPRESSION: No acute intra-abdominal or pelvic finding by noncontrast CT. Cardiomegaly Stable left hepatic 1.8 cm cyst Aortic Atherosclerosis (ICD10-I70.0). Electronically Signed   By: Jerilynn Mages.  Shick M.D.   On: 05/18/2020 13:57   DG Abd 1 View  Result Date: 05/26/2020 CLINICAL DATA:  Nasogastric placement EXAM: ABDOMEN - 1 VIEW COMPARISON:  CT same day FINDINGS: Orogastric or nasogastric tube enters the abdomen with its tip in the distal body or antrum of the stomach. IMPRESSION: Orogastric or nasogastric tube tip in the distal body or antrum of the stomach. Electronically Signed   By: Nelson Chimes M.D.   On: 05/18/2020 22:32   DG CHEST PORT 1 VIEW  Result Date: 2020/06/18 CLINICAL DATA:  Shortness of breath. EXAM: PORTABLE CHEST 1 VIEW COMPARISON:  chest x-ray 05/22/2020, CT chest 04/15/2020. FINDINGS: Interval retraction of an endotracheal tube with tip terminating 3.5 cm above the carina. Left chest wall cardiac defibrillator in grossly similar position. Enteric tube coursing below the hemidiaphragm with suggestion of the side port at the edge of the inferior image overlying the expected region of the gastric lumen and the tip collimated off view. The heart size and mediastinal contours are unchanged with persistent cardiomegaly. Thoracic aorta atherosclerotic plaque. Sternotomy wires and surgical cardiac  stages overlie the mediastinum. Lingular linear atelectasis. No focal consolidation. No pulmonary edema. No pleural effusion. No pneumothorax. No acute osseous abnormality. IMPRESSION: 1. Interval retraction of an endotracheal tube with tip now 3.5 cm above the carina. 2. Other lines and tubes in stable position. 3. No acute cardiopulmonary abnormality. Electronically Signed   By: Iven Finn M.D.   On: 06-18-2020 05:39   DG CHEST PORT 1 VIEW  Result Date: 05/22/2020 CLINICAL DATA:  Intubation. EXAM: PORTABLE CHEST 1 VIEW COMPARISON:  05/20/2020. FINDINGS: Endotracheal tube tip noted 4 mm above the carina. Proximal repositioning of approximately 3 cm suggested. NG tube noted with tip below left hemidiaphragm. PICC line noted with tip over right brachiocephalic vein. Cardiac pacer noted stable position. Prior CABG. Cardiomegaly. Bilateral interstitial prominence. CHF could present this fashion. Pneumonitis cannot be excluded. No pleural effusion or pneumothorax. IMPRESSION: 1. Endotracheal tube tip noted 4 mm above the carina. Proximal repositioning of approximately 3 cm suggested. 2. NG tube noted with tip below left hemidiaphragm. PICC line noted with tip over right brachiocephalic vein. 3. Cardiac pacer stable position. Prior CABG. Cardiomegaly. Bilateral interstitial prominence. CHF could present in this fashion. These results will be called to the ordering clinician or representative by the Radiologist Assistant, and communication documented in the PACS or [employer]. Electronically Signed   By: Marcello Moores  Register   On: 05/22/2020 05:36   DG Chest Port 1 View  Result Date: 05/20/2020 CLINICAL DATA:  Encephalopathy. EXAM: PORTABLE CHEST 1 VIEW COMPARISON:  Chest x-ray 05/25/2020, CT chest 04/15/2020 FINDINGS: Left chest wall cardiac defibrillator in similar position. Sternotomy wires and cardiac surgical changes are again noted to overlie the mediastinum. Enteric tube courses below the  hemidiaphragm with side port overlying the expected region of the gastric lumen and collimated off view. The heart size and mediastinal contours are unchanged.  Lingular linear atelectasis. No focal consolidation. No pulmonary edema. No pleural effusion. No pneumothorax. No acute osseous abnormality. IMPRESSION: No active disease. Electronically Signed   By: Iven Finn M.D.   On: 05/20/2020 05:29   DG Chest Portable 1 View  Result Date: 05/22/2020 CLINICAL DATA:  Per nurse: Pt came in as Code Stroke. Family LKN 1730 yesterday. She has progressively become weaker since that time. EMS reports Lt sided facial droop AND Right sided weakness upon their arrival. EXAM: PORTABLE CHEST 1 VIEW COMPARISON:  04/15/2020 FINDINGS: Stable changes from prior cardiac surgery. Cardiac silhouette is mildly enlarged. No mediastinal or hilar masses. Left anterior chest wall AICD is stable. Clear lungs.  No pleural effusion or pneumothorax. Skeletal structures are grossly intact. IMPRESSION: No acute cardiopulmonary disease. Electronically Signed   By: Lajean Manes M.D.   On: 05/15/2020 16:03   ECHOCARDIOGRAM COMPLETE  Result Date: 05/21/2020    ECHOCARDIOGRAM REPORT   Patient Name:   Lynn Carey [facility] Date of Exam: 05/20/2020 Medical Rec #:  [id number]       Height:       60.0 in Accession #:    [id number]      Weight:       138.7 lb Date of Birth:  January 09, 1941       BSA:          1.598 m Patient Age:    47 years        BP:           148/98 mmHg Patient Gender: F               HR:           93 bpm. Exam Location:  Inpatient Procedure: 2D Echo, Cardiac Doppler, Color Doppler and Intracardiac            Opacification Agent Indications:     Cardiomyopathy-Ischemic I25.5  History:         Patient has prior history of Echocardiogram examinations, most                  recent 05/11/2020. CAD; Risk Factors:Hypertension and Diabetes.  Sonographer:     Bernadene Person RDCS Referring Phys:  Almyra Brace Diagnosing Phys: Adrian Prows MD  IMPRESSIONS  1. Left ventricular ejection fraction, by estimation, is 20 to 25%. The left ventricle has severely decreased function. The left ventricle demonstrates global hypokinesis. The left ventricular internal cavity size was mildly dilated. Left ventricular diastolic parameters are consistent with Grade II diastolic dysfunction (pseudonormalization). Elevated left ventricular end-diastolic pressure. The E/e' is 16. There is the interventricular septum is flattened in diastole ('D' shaped left ventricle), consistent with right ventricular volume overload.  2. Right ventricular systolic function was not well visualized. The right ventricular size is mildly enlarged. There is severely elevated pulmonary artery systolic pressure. The estimated right ventricular systolic pressure is [id number] mmHg.  3. Left atrial size was moderately dilated.  4. The mitral valve is normal in structure. Moderate to severe mitral valve regurgitation.  5. Tricuspid valve regurgitation is severe.  6. The aortic valve is normal in structure. Aortic valve regurgitation is not visualized. No aortic stenosis is present.  7. The inferior vena cava is dilated in size with <50% respiratory variability, suggesting right atrial pressure of 15 mmHg. FINDINGS  Left Ventricle: Left ventricular ejection fraction, by estimation, is 20 to 25%. The left ventricle has severely decreased function. The left ventricle demonstrates global hypokinesis. Definity contrast agent was  given IV to delineate the left ventricular endocardial borders. The left ventricular internal cavity size was mildly dilated. There is no left ventricular hypertrophy. The interventricular septum is flattened in diastole ('D' shaped left ventricle), consistent with right ventricular volume overload. Left ventricular diastolic parameters are consistent with Grade II diastolic dysfunction (pseudonormalization). Elevated left ventricular end-diastolic pressure. The E/e' is 16. Right  Ventricle: The right ventricular size is mildly enlarged. Right vetricular wall thickness was not assessed. Right ventricular systolic function was not well visualized. There is severely elevated pulmonary artery systolic pressure. The tricuspid regurgitant velocity is 3.92 m/s, and with an assumed right atrial pressure of 15 mmHg, the estimated right ventricular systolic pressure is [id number] mmHg. Left Atrium: Left atrial size was moderately dilated. Right Atrium: Right atrial size was not well visualized. Pericardium: There is no evidence of pericardial effusion. Mitral Valve: The mitral valve is normal in structure. Mild mitral annular calcification. Moderate to severe mitral valve regurgitation. Tricuspid Valve: The tricuspid valve is normal in structure. Tricuspid valve regurgitation is severe. Aortic Valve: The aortic valve is normal in structure. Aortic valve regurgitation is not visualized. No aortic stenosis is present. Pulmonic Valve: The pulmonic valve was grossly normal. Pulmonic valve regurgitation is trivial. Aorta: The aortic root is normal in size and structure. Venous: The inferior vena cava is dilated in size with less than 50% respiratory variability, suggesting right atrial pressure of 15 mmHg. IAS/Shunts: No atrial level shunt detected by color flow Doppler. Additional Comments: A pacer wire is visualized.  LEFT VENTRICLE PLAX 2D LVIDd:         5.10 cm      Diastology LVIDs:         5.00 cm      LV e' medial:    3.49 cm/s LV PW:         0.80 cm      LV E/e' medial:  24.6 LV IVS:        0.60 cm      LV e' lateral:   5.35 cm/s LVOT diam:     1.60 cm      LV E/e' lateral: 16.0 LV SV:         14 LV SV Index:   9 LVOT Area:     2.01 cm  LV Volumes (MOD) LV vol d, MOD A2C: 160.0 ml LV vol d, MOD A4C: 128.0 ml LV vol s, MOD A2C: 127.0 ml LV vol s, MOD A4C: 105.0 ml LV SV MOD A2C:     33.0 ml LV SV MOD A4C:     128.0 ml LV SV MOD BP:      27.4 ml RIGHT VENTRICLE RV S prime:     2.98 cm/s TAPSE (M-mode):  0.9 cm LEFT ATRIUM           Index       RIGHT ATRIUM           Index LA diam:      3.20 cm 2.00 cm/m  RA Area:     15.50 cm LA Vol (A2C): 50.8 ml 31.80 ml/m RA Volume:   42.10 ml  26.35 ml/m LA Vol (A4C): 52.8 ml 33.05 ml/m  AORTIC VALVE LVOT Vmax:   48.30 cm/s LVOT Vmean:  32.800 cm/s LVOT VTI:    0.071 m  AORTA Ao Root diam: 2.20 cm Ao Asc diam:  2.30 cm MITRAL VALVE  TRICUSPID VALVE MV Area (PHT): 6.17 cm      TR Peak grad:   61.5 mmHg MV Decel Time: 123 msec      TR Vmax:        392.00 cm/s MR Peak grad:    112.8 mmHg MR Mean grad:    74.0 mmHg   SHUNTS MR Vmax:         531.00 cm/s Systemic VTI:  0.07 m MR Vmean:        414.0 cm/s  Systemic Diam: 1.60 cm MR PISA:         1.57 cm MR PISA Eff ROA: 9 mm MR PISA Radius:  0.50 cm MV E velocity: 85.70 cm/s MV A velocity: 52.30 cm/s MV E/A ratio:  1.64 Adrian Prows MD Electronically signed by Adrian Prows MD Signature Date/Time: 05/21/2020/9:50:52 AM    Final    US LIVER DOPPLER  Result Date: 05/16/2020 CLINICAL DATA:  Liver failure EXAM: DUPLEX ULTRASOUND OF LIVER TECHNIQUE: Color and duplex Doppler ultrasound was performed to evaluate the hepatic in-flow and out-flow vessels. COMPARISON:  05/08/2020 CT without contrast FINDINGS: Liver: Increased hepatic echogenicity subcapsular left hepatic lobe anechoic cyst measures 1.6 cm. No other significant abnormality or intrahepatic biliary dilatation. Main Portal Vein size: 0.9 cm Portal Vein Velocities Main Prox:  13 cm/sec Main Mid: 16 cm/sec Main Dist:  17 cm/sec Right: 9 cm/sec Left: 18 cm/sec Hepatic Vein Velocities Right:  30 cm/sec Middle:  68 cm/sec Left:  61 cm/sec IVC: Present and patent with normal respiratory phasicity. Hepatic Artery Velocity:  41 cm/sec Splenic Vein Velocity:  17 cm/sec Spleen: 3.5 cm x 6.8 cm x 7.1 cm with a total volume of 86 cm^3 (411 cm^3 is upper limit normal) Portal Vein Occlusion/Thrombus: No Splenic Vein Occlusion/Thrombus: No Ascites: None Varices: None Patent  portal, hepatic and splenic veins with normal directional flow. No portal vein occlusion or thrombus. No free fluid or ascites. IMPRESSION: Patent hepatic venous system.  No veno-occlusive process. Electronically Signed   By: Jerilynn Mages.  Shick M.D.   On: 05/16/2020 18:04   CT HEAD CODE STROKE WO CONTRAST  Result Date: 05/14/2020 CLINICAL DATA:  Code stroke. 80 year old female with altered mental status, facial droop. Last known well 1730 hours yesterday. EXAM: CT HEAD WITHOUT CONTRAST TECHNIQUE: Contiguous axial images were obtained from the base of the skull through the vertex without intravenous contrast. COMPARISON:  Head CT 04/15/2020.  Brain MRI 04/16/2020. FINDINGS: Brain: Small chronic area of right parietal lobe encephalomalacia is stable. Stable much smaller area of cortical encephalomalacia at the inferior left occipital pole. Patchy bilateral white matter hypodensity is stable. No midline shift, ventriculomegaly, mass effect, evidence of mass lesion, intracranial hemorrhage or evidence of cortically based acute infarction. Partially empty sella again noted. Stable dystrophic calcifications in the basal ganglia. Vascular: Calcified atherosclerosis at the skull base. No suspicious intracranial vascular hyperdensity. Skull: No acute osseous abnormality identified. Sinuses/Orbits: Visualized paranasal sinuses and mastoids are stable and well pneumatized. Other: Visualized orbits and scalp soft tissues are within normal limits. ASPECTS [facility] Stroke Program Early CT Score) Total score (0-10 with 10 being normal): 10 (chronic right parietal and left occipital cortical encephalomalacia). IMPRESSION: 1. Stable brain. No acute cortically based infarct or acute intracranial hemorrhage identified. ASPECTS 10. 2. Chronic right parietal and left occipital cortical encephalomalacia. 3. These results were communicated to Dr. Cheral Marker at 9:13 am on 05/31/2020 by text page via the [facility] messaging system. Electronically Signed    By: Herminio Heads.D.  On: 05/07/2020 09:13   [place] EKG SITE RITE  Result Date: 05/20/2020 If Site Rite image not attached, placement could not be confirmed due to current cardiac rhythm.    Rose Fillers Grayden Burley 06/11/2020, 2:14 PM

## 2020-07-31 ENCOUNTER — Ambulatory Visit: Payer: Medicare HMO

## 2020-07-31 ENCOUNTER — Ambulatory Visit: Payer: Medicare HMO | Admitting: Internal Medicine

## 2020-08-26 ENCOUNTER — Other Ambulatory Visit: Payer: Medicare HMO

## 2020-09-11 ENCOUNTER — Ambulatory Visit: Payer: Medicare HMO | Admitting: Cardiology

## 2021-08-24 IMAGING — CT CT HEAD CODE STROKE
3 series · 14 of 47 positions shown, 16 images · non-contrast
Comparison: Head CT 04/15/2020.  Brain MRI 04/16/2020.

CLINICAL DATA: Code stroke. 79-year-old female with altered mental
status, facial droop. Last known well 3780 hours yesterday.

EXAM:
CT HEAD WITHOUT CONTRAST
TECHNIQUE: Contiguous axial images were obtained from the base of the skull
through the vertex without intravenous contrast.

[Series 3: head 5.0 h30s · axial · 0.43mm/px · z∈[-129,+11]mm · 8 of 34 slices shown, 10 images]
[im 3/34  brain]
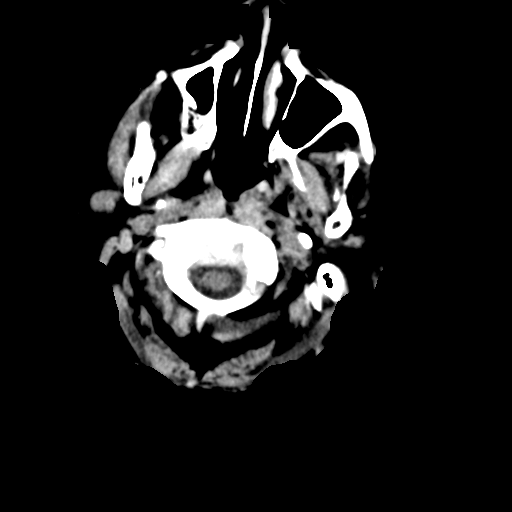
[im 3/34  bone]
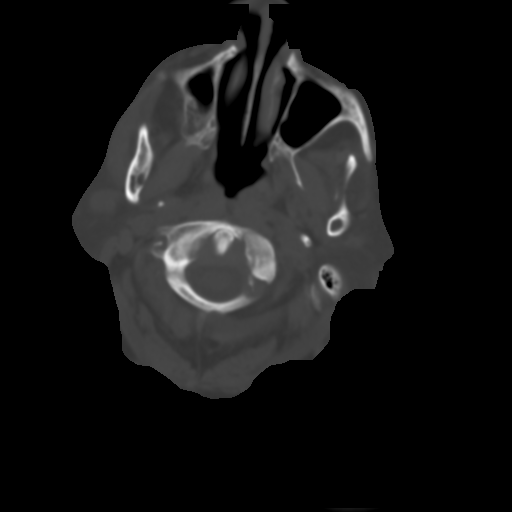
[im 7/34  brain]
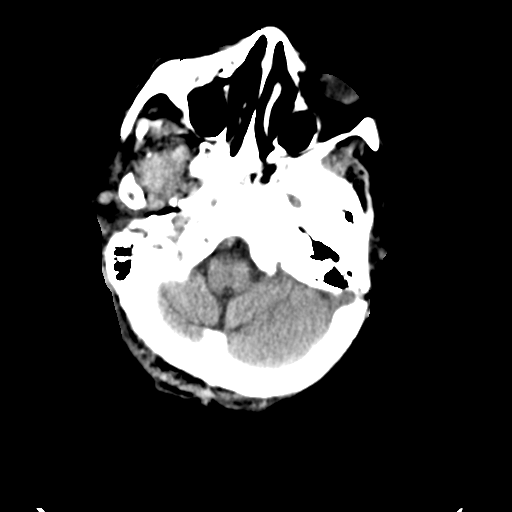
[im 11/34  brain]
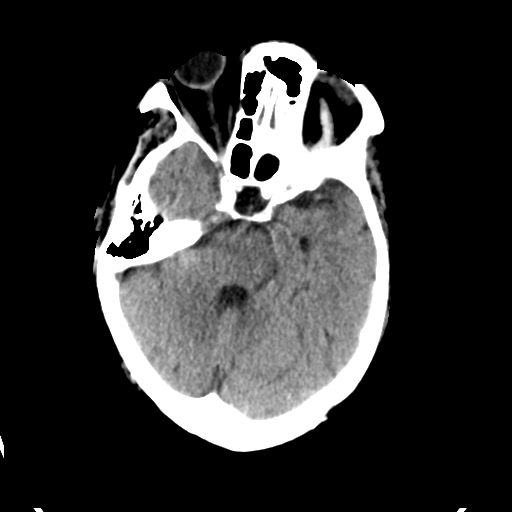
[im 15/34  brain]
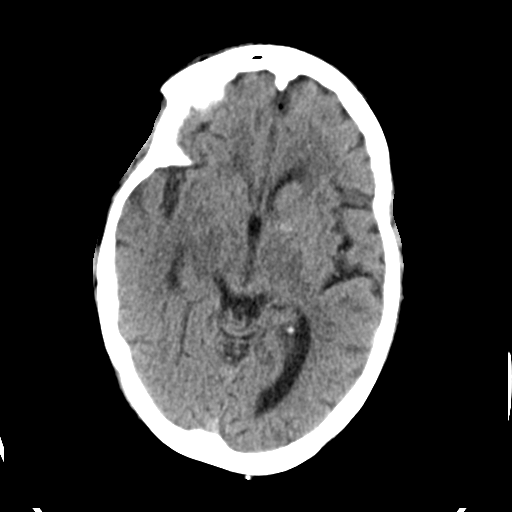
[im 19/34  brain]
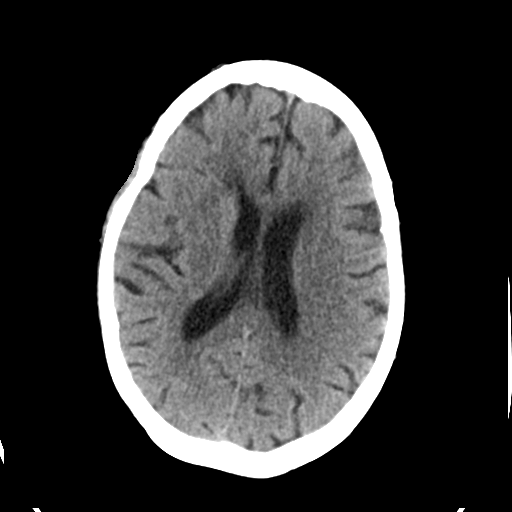
[im 19/34  bone]
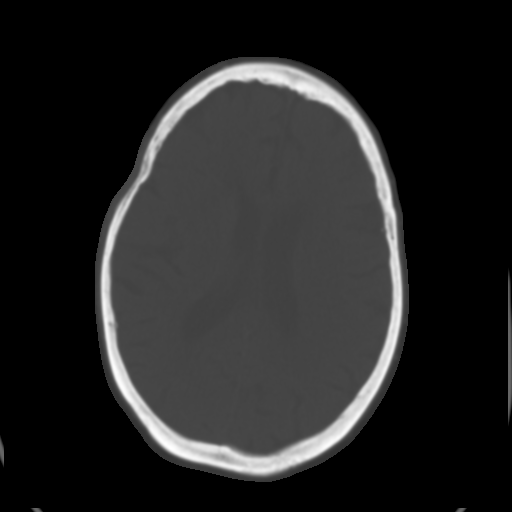
[im 23/34  brain]
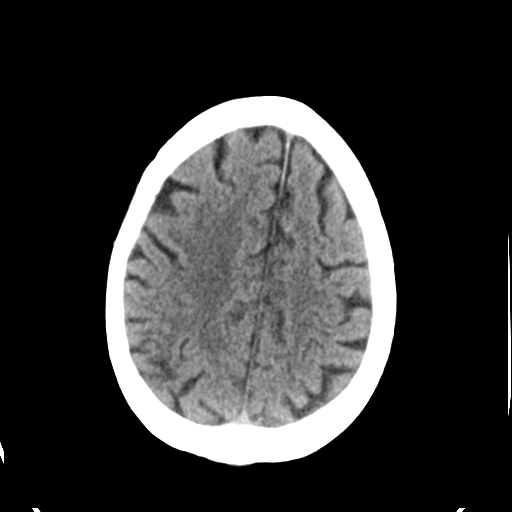
[im 27/34  brain]
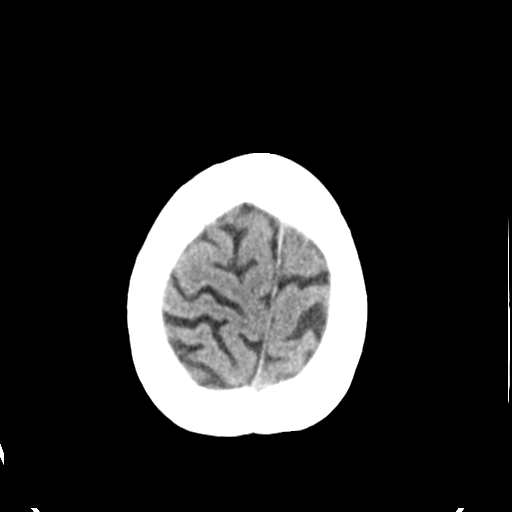
[im 31/34  brain]
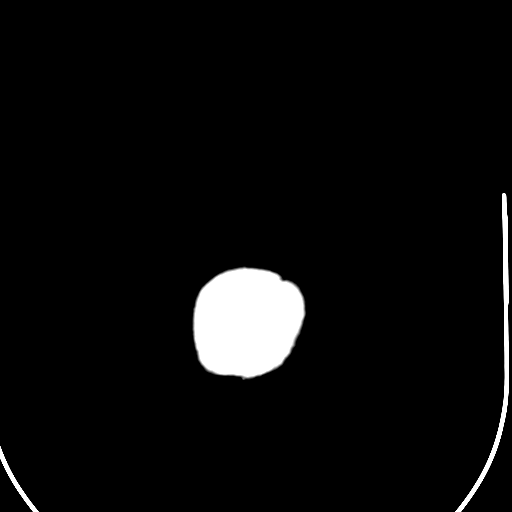

[Series 5: head 3.0 mpr cor · coronal · 0.35mm/px · 3 of 72 slices shown]
[im 24/72  brain]
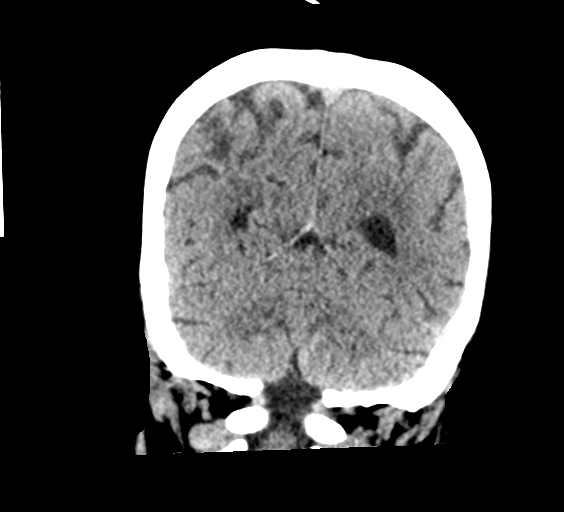
[im 32/72  brain]
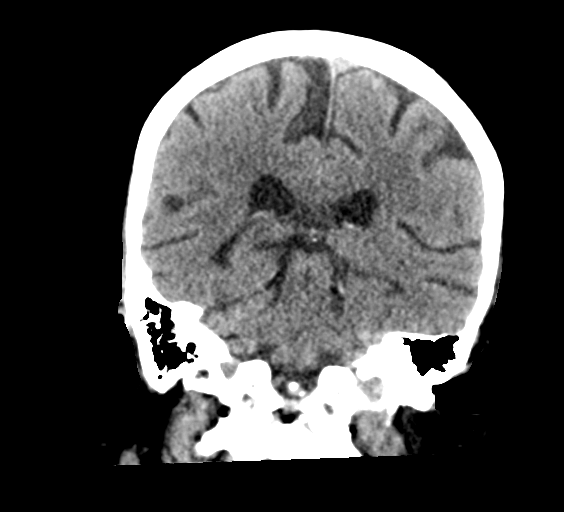
[im 40/72  brain]
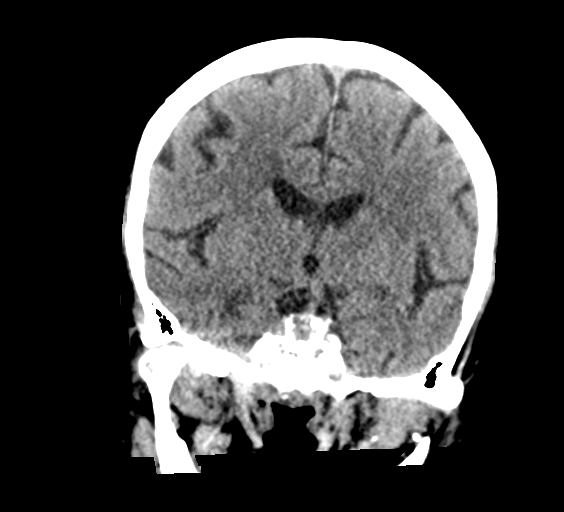

[Series 6: head 3.0 mpr sag · sagittal · 0.33mm/px · 3 of 62 slices shown]
[im 23/62  brain]
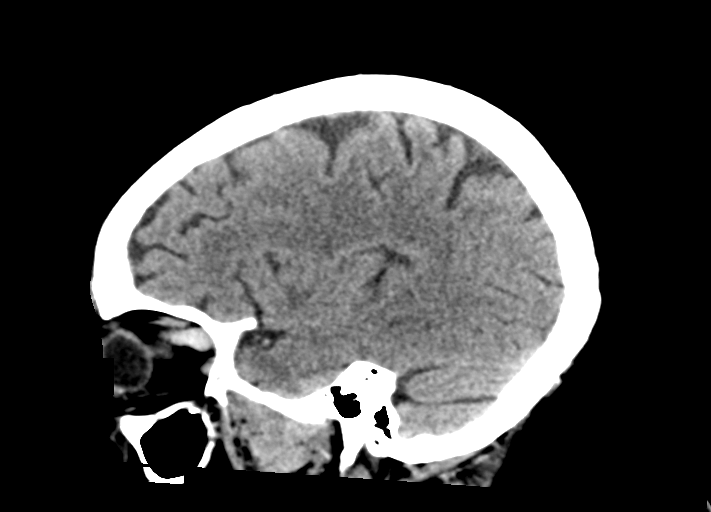
[im 32/62  brain]
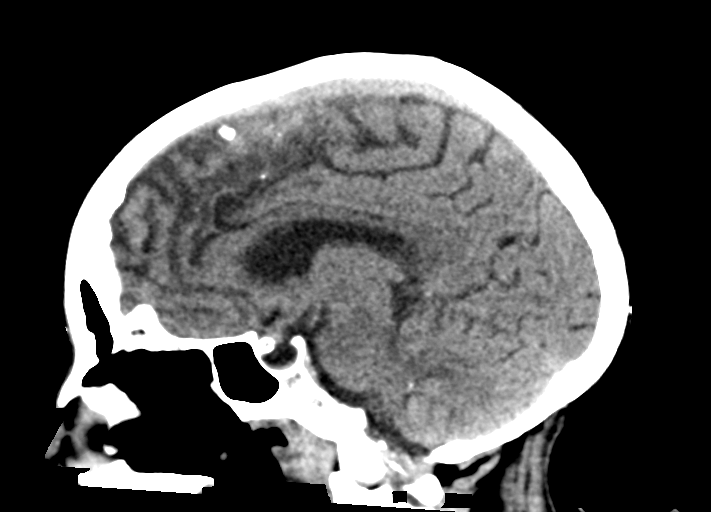
[im 41/62  brain]
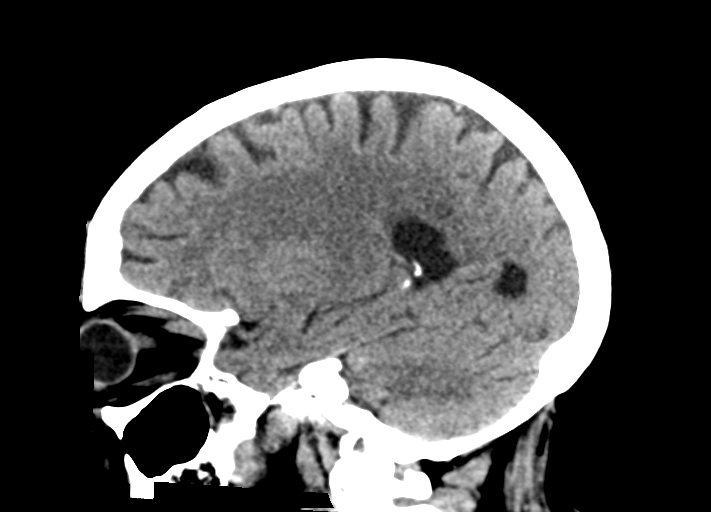

[14 of 47 positions shown; findings below may reference images not displayed]

FINDINGS: Brain: Small chronic area of right parietal lobe encephalomalacia is
stable. Stable much smaller area of cortical encephalomalacia at the
inferior left occipital pole. Patchy bilateral white matter
hypodensity is stable.

No midline shift, ventriculomegaly, mass effect, evidence of mass
lesion, intracranial hemorrhage or evidence of cortically based
acute infarction. Partially empty sella again noted. Stable
dystrophic calcifications in the basal ganglia.

Vascular: Calcified atherosclerosis at the skull base. No suspicious
intracranial vascular hyperdensity.

Skull: No acute osseous abnormality identified.

Sinuses/Orbits: Visualized paranasal sinuses and mastoids are stable
and well pneumatized.

Other: Visualized orbits and scalp soft tissues are within normal
limits.

ASPECTS ([place] Stroke Program Early CT Score)

Total score (0-10 with 10 being normal): 10 (chronic right parietal
and left occipital cortical encephalomalacia).
IMPRESSION: 1. Stable brain. No acute cortically based infarct or acute
intracranial hemorrhage identified. ASPECTS 10.
2. Chronic right parietal and left occipital cortical
encephalomalacia.
3. These results were communicated to Dr. Abdiin Saba at [DATE] on
05/19/2020 by text page via the [employer] messaging system.

## 2021-08-24 IMAGING — US US HEPATIC LIVER DOPPLER
1 series · 14 of 25 positions shown · non-contrast
Comparison: 05/19/2020 CT without contrast

CLINICAL DATA: Liver failure

EXAM:
DUPLEX ULTRASOUND OF LIVER
TECHNIQUE: Color and duplex Doppler ultrasound was performed to evaluate the
hepatic in-flow and out-flow vessels.

[Series 1: us liver doppler · 14 of 39 slices shown]
[im 1/39]
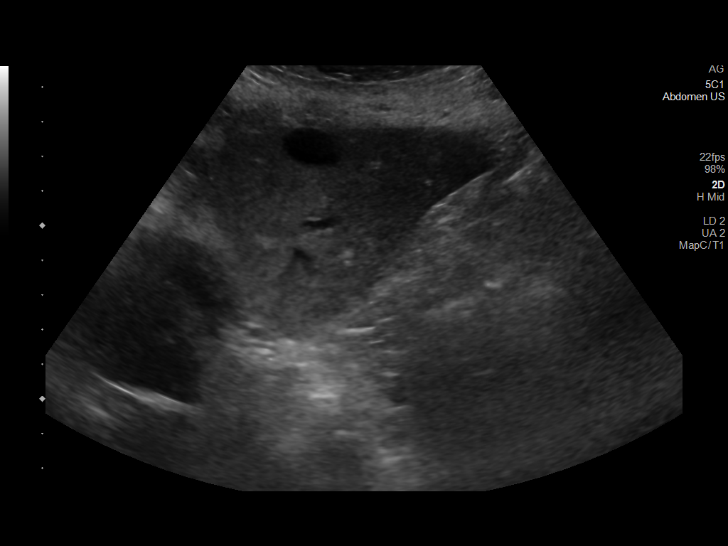
[im 4/39]
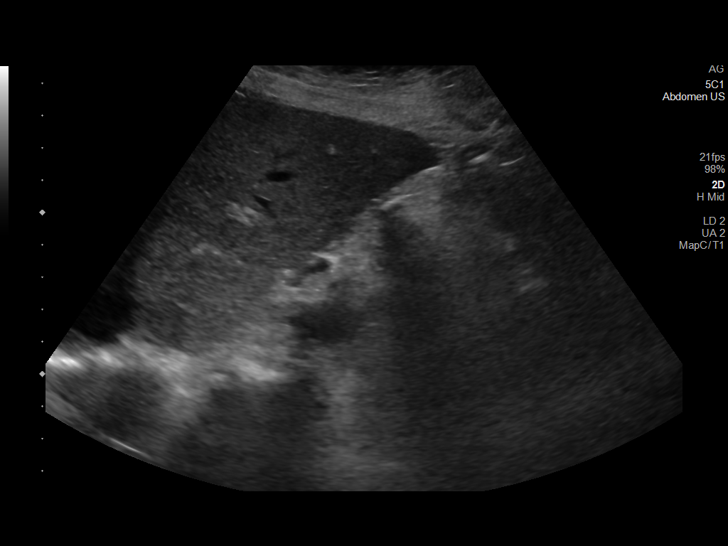
[im 7/39]
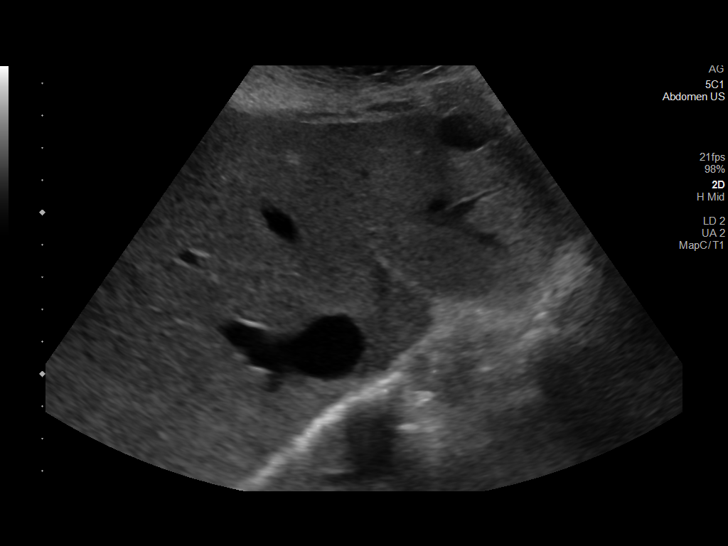
[im 10/39]
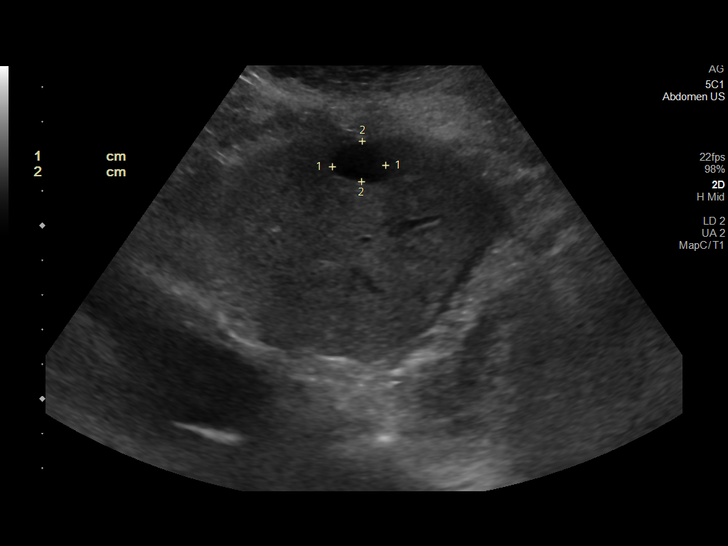
[im 13/39]
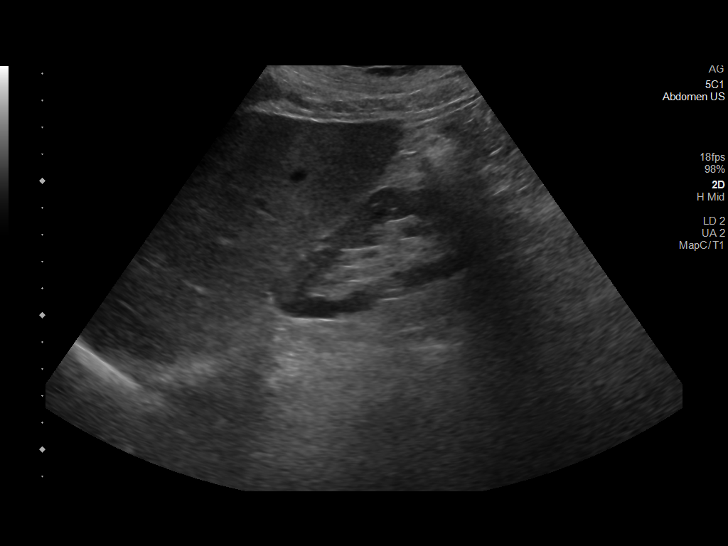
[im 15/39]
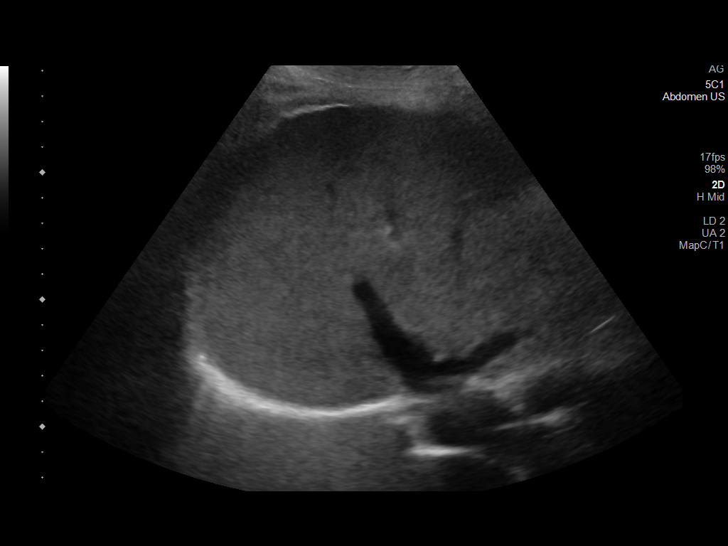
[im 18/39]
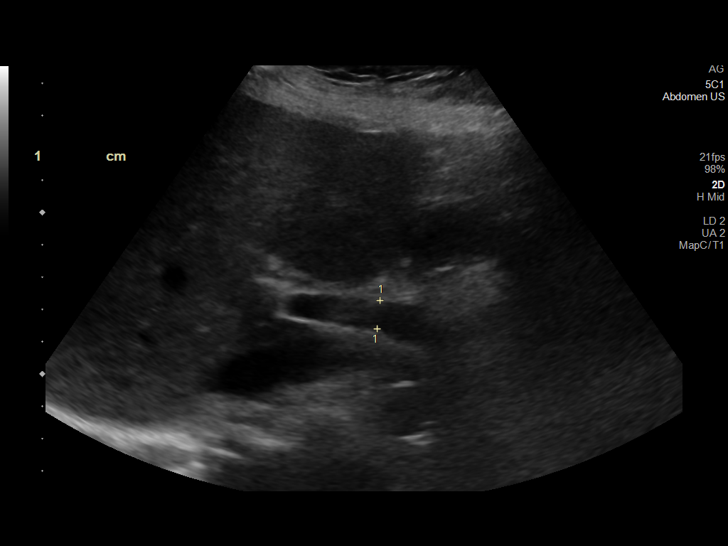
[im 21/39]
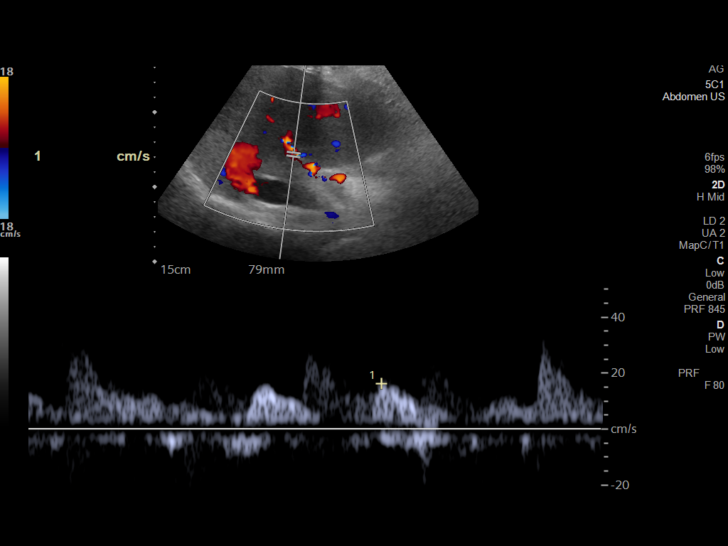
[im 24/39]
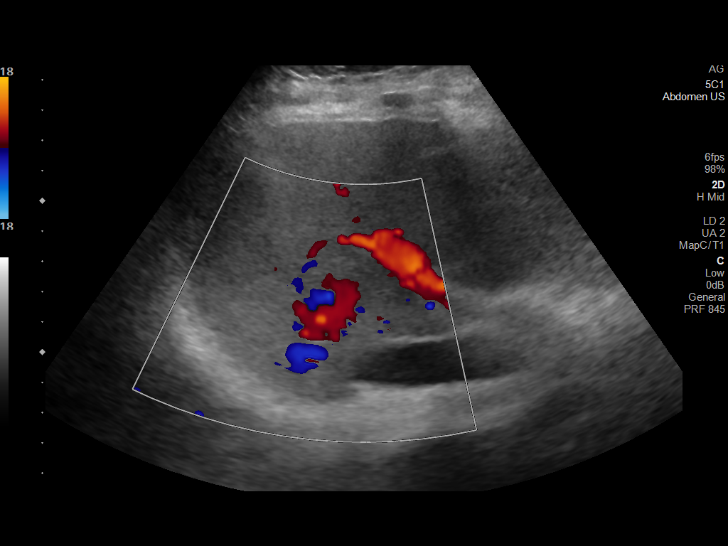
[im 26/39]
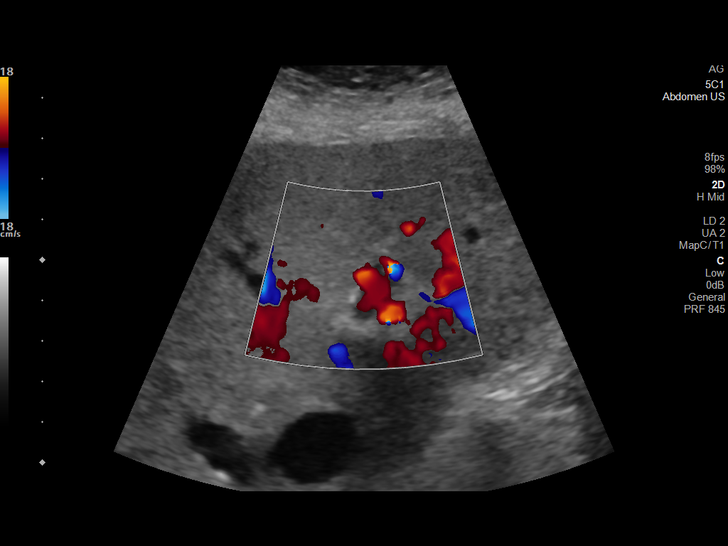
[im 29/39]
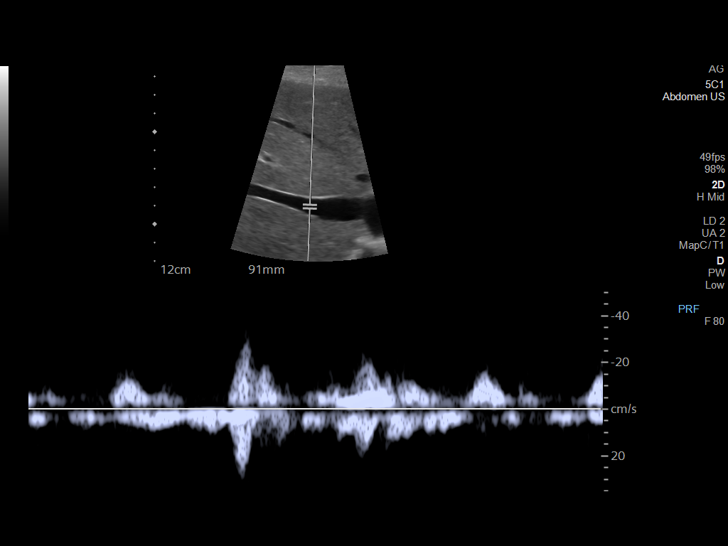
[im 32/39]
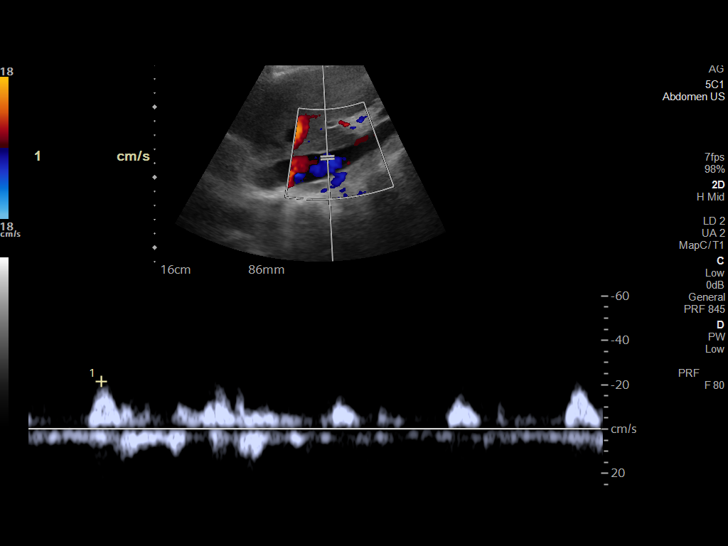
[im 35/39]
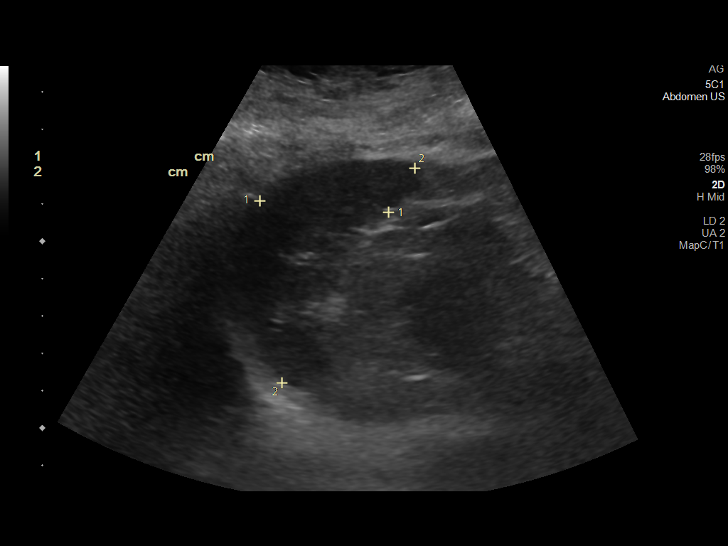
[im 39/39]
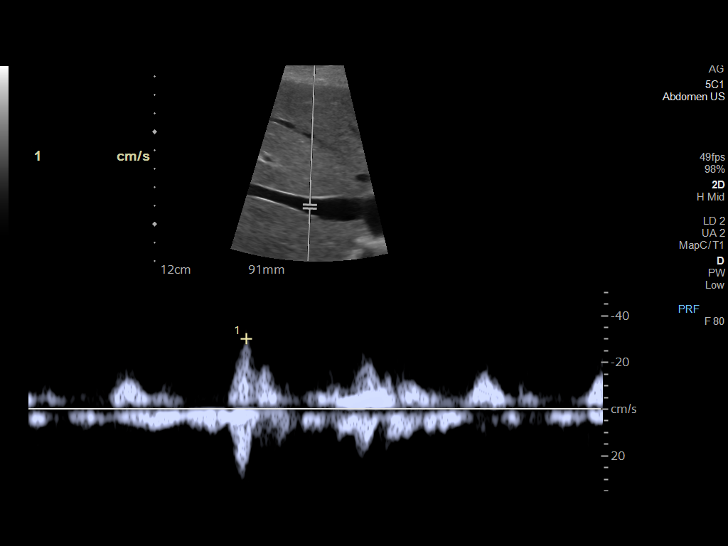

[14 of 25 positions shown; findings below may reference images not displayed]

FINDINGS: Liver: Increased hepatic echogenicity subcapsular left hepatic lobe
anechoic cyst measures 1.6 cm.

No other significant abnormality or intrahepatic biliary dilatation.

Main Portal Vein size: 0.9 cm

Portal Vein Velocities

Main Prox:  13 cm/sec

Main Mid: 16 cm/sec

Main Dist:  17 cm/sec
Right: 9 cm/sec
Left: 18 cm/sec

Hepatic Vein Velocities

Right:  30 cm/sec

Middle:  68 cm/sec

Left:  61 cm/sec

IVC: Present and patent with normal respiratory phasicity.

Hepatic Artery Velocity:  41 cm/sec

Splenic Vein Velocity:  17 cm/sec

Spleen: 3.5 cm x 6.8 cm x 7.1 cm with a total volume of 86 cm^3
(411 cm^3 is upper limit normal)

Portal Vein Occlusion/Thrombus: No

Splenic Vein Occlusion/Thrombus: No

Ascites: None

Varices: None

Patent portal, hepatic and splenic veins with normal directional
flow. No portal vein occlusion or thrombus. No free fluid or
ascites.
IMPRESSION: Patent hepatic venous system.  No Mick process.
